# Patient Record
Sex: Female | Born: 1965 | Race: White | Hispanic: No | Smoking: Former smoker
Health system: Southern US, Community
[De-identification: ages and names within clinical notes are randomized; demographics above are authoritative.]

## PROBLEM LIST (undated history)

## (undated) DIAGNOSIS — F329 Major depressive disorder, single episode, unspecified: Secondary | ICD-10-CM

## (undated) DIAGNOSIS — R142 Eructation: Secondary | ICD-10-CM

## (undated) DIAGNOSIS — F32A Depression, unspecified: Secondary | ICD-10-CM

## (undated) DIAGNOSIS — R209 Unspecified disturbances of skin sensation: Secondary | ICD-10-CM

## (undated) DIAGNOSIS — M961 Postlaminectomy syndrome, not elsewhere classified: Secondary | ICD-10-CM

## (undated) DIAGNOSIS — K219 Gastro-esophageal reflux disease without esophagitis: Secondary | ICD-10-CM

## (undated) DIAGNOSIS — M1990 Unspecified osteoarthritis, unspecified site: Secondary | ICD-10-CM

## (undated) DIAGNOSIS — Z87442 Personal history of urinary calculi: Secondary | ICD-10-CM

## (undated) DIAGNOSIS — G8928 Other chronic postprocedural pain: Secondary | ICD-10-CM

## (undated) DIAGNOSIS — M797 Fibromyalgia: Secondary | ICD-10-CM

## (undated) DIAGNOSIS — F319 Bipolar disorder, unspecified: Secondary | ICD-10-CM

## (undated) DIAGNOSIS — M810 Age-related osteoporosis without current pathological fracture: Secondary | ICD-10-CM

## (undated) DIAGNOSIS — M5430 Sciatica, unspecified side: Secondary | ICD-10-CM

## (undated) DIAGNOSIS — E785 Hyperlipidemia, unspecified: Secondary | ICD-10-CM

## (undated) DIAGNOSIS — F419 Anxiety disorder, unspecified: Secondary | ICD-10-CM

## (undated) DIAGNOSIS — M5417 Radiculopathy, lumbosacral region: Secondary | ICD-10-CM

## (undated) DIAGNOSIS — G894 Chronic pain syndrome: Secondary | ICD-10-CM

## (undated) HISTORY — DX: Postlaminectomy syndrome, not elsewhere classified: M96.1

## (undated) HISTORY — DX: Anxiety disorder, unspecified: F41.9

## (undated) HISTORY — DX: Radiculopathy, lumbosacral region: M54.17

## (undated) HISTORY — DX: Other chronic postprocedural pain: G89.28

## (undated) HISTORY — DX: Hyperlipidemia, unspecified: E78.5

## (undated) HISTORY — DX: Chronic pain syndrome: G89.4

## (undated) HISTORY — PX: CYSTECTOMY: SUR359

## (undated) HISTORY — PX: ABDOMINAL HYSTERECTOMY: SHX81

## (undated) HISTORY — DX: Bipolar disorder, unspecified: F31.9

## (undated) HISTORY — PX: WRIST ARTHROPLASTY: SHX1088

## (undated) HISTORY — DX: Unspecified disturbances of skin sensation: R20.9

## (undated) HISTORY — PX: BACK SURGERY: SHX140

## (undated) HISTORY — DX: Depression, unspecified: F32.A

## (undated) HISTORY — DX: Sciatica, unspecified side: M54.30

## (undated) HISTORY — DX: Major depressive disorder, single episode, unspecified: F32.9

## (undated) HISTORY — DX: Unspecified osteoarthritis, unspecified site: M19.90

## (undated) HISTORY — PX: DIAGNOSTIC LAPAROSCOPY: SUR761

---

## 1998-09-16 ENCOUNTER — Emergency Department (HOSPITAL_COMMUNITY): Admission: EM | Admit: 1998-09-16 | Discharge: 1998-09-16 | Payer: Self-pay | Admitting: Emergency Medicine

## 1998-09-18 ENCOUNTER — Ambulatory Visit (HOSPITAL_COMMUNITY): Admission: RE | Admit: 1998-09-18 | Discharge: 1998-09-18 | Payer: Self-pay | Admitting: *Deleted

## 1998-09-25 ENCOUNTER — Ambulatory Visit (HOSPITAL_COMMUNITY): Admission: RE | Admit: 1998-09-25 | Discharge: 1998-09-25 | Payer: Self-pay | Admitting: *Deleted

## 1998-10-02 ENCOUNTER — Ambulatory Visit (HOSPITAL_COMMUNITY): Admission: RE | Admit: 1998-10-02 | Discharge: 1998-10-02 | Payer: Self-pay | Admitting: *Deleted

## 1998-10-02 ENCOUNTER — Encounter: Payer: Self-pay | Admitting: *Deleted

## 1998-10-16 ENCOUNTER — Encounter: Payer: Self-pay | Admitting: *Deleted

## 1998-10-16 ENCOUNTER — Ambulatory Visit (HOSPITAL_COMMUNITY): Admission: RE | Admit: 1998-10-16 | Discharge: 1998-10-16 | Payer: Self-pay | Admitting: *Deleted

## 1998-10-30 ENCOUNTER — Ambulatory Visit (HOSPITAL_COMMUNITY): Admission: RE | Admit: 1998-10-30 | Discharge: 1998-10-30 | Payer: Self-pay | Admitting: *Deleted

## 2000-01-12 ENCOUNTER — Encounter: Payer: Self-pay | Admitting: Family Medicine

## 2000-01-12 ENCOUNTER — Ambulatory Visit (HOSPITAL_COMMUNITY): Admission: RE | Admit: 2000-01-12 | Discharge: 2000-01-12 | Payer: Self-pay | Admitting: Family Medicine

## 2000-02-01 ENCOUNTER — Emergency Department (HOSPITAL_COMMUNITY): Admission: EM | Admit: 2000-02-01 | Discharge: 2000-02-01 | Payer: Self-pay | Admitting: Emergency Medicine

## 2000-02-02 ENCOUNTER — Encounter: Payer: Self-pay | Admitting: Emergency Medicine

## 2001-11-10 ENCOUNTER — Other Ambulatory Visit: Admission: RE | Admit: 2001-11-10 | Discharge: 2001-11-10 | Payer: Self-pay | Admitting: *Deleted

## 2001-11-11 ENCOUNTER — Emergency Department (HOSPITAL_COMMUNITY): Admission: EM | Admit: 2001-11-11 | Discharge: 2001-11-11 | Payer: Self-pay | Admitting: Emergency Medicine

## 2001-11-11 ENCOUNTER — Encounter: Payer: Self-pay | Admitting: Emergency Medicine

## 2002-06-28 ENCOUNTER — Encounter: Admission: RE | Admit: 2002-06-28 | Discharge: 2002-06-28 | Payer: Self-pay | Admitting: Specialist

## 2002-06-28 ENCOUNTER — Encounter: Payer: Self-pay | Admitting: Specialist

## 2002-07-18 ENCOUNTER — Encounter: Payer: Self-pay | Admitting: Specialist

## 2002-07-18 ENCOUNTER — Inpatient Hospital Stay (HOSPITAL_COMMUNITY): Admission: RE | Admit: 2002-07-18 | Discharge: 2002-07-22 | Payer: Self-pay | Admitting: Specialist

## 2003-04-05 ENCOUNTER — Emergency Department (HOSPITAL_COMMUNITY): Admission: AD | Admit: 2003-04-05 | Discharge: 2003-04-05 | Payer: Self-pay | Admitting: Emergency Medicine

## 2008-07-22 ENCOUNTER — Ambulatory Visit: Payer: Self-pay | Admitting: Internal Medicine

## 2008-07-22 DIAGNOSIS — M797 Fibromyalgia: Secondary | ICD-10-CM | POA: Insufficient documentation

## 2008-07-22 DIAGNOSIS — R930 Abnormal findings on diagnostic imaging of skull and head, not elsewhere classified: Secondary | ICD-10-CM | POA: Insufficient documentation

## 2008-07-22 DIAGNOSIS — M545 Low back pain: Secondary | ICD-10-CM

## 2008-07-22 DIAGNOSIS — Z87442 Personal history of urinary calculi: Secondary | ICD-10-CM

## 2008-07-22 DIAGNOSIS — G609 Hereditary and idiopathic neuropathy, unspecified: Secondary | ICD-10-CM | POA: Insufficient documentation

## 2008-07-22 DIAGNOSIS — J45909 Unspecified asthma, uncomplicated: Secondary | ICD-10-CM | POA: Insufficient documentation

## 2008-07-22 DIAGNOSIS — J309 Allergic rhinitis, unspecified: Secondary | ICD-10-CM | POA: Insufficient documentation

## 2008-07-22 DIAGNOSIS — F329 Major depressive disorder, single episode, unspecified: Secondary | ICD-10-CM

## 2008-07-22 DIAGNOSIS — F411 Generalized anxiety disorder: Secondary | ICD-10-CM | POA: Insufficient documentation

## 2008-08-02 ENCOUNTER — Telehealth (INDEPENDENT_AMBULATORY_CARE_PROVIDER_SITE_OTHER): Payer: Self-pay | Admitting: *Deleted

## 2008-08-14 ENCOUNTER — Encounter: Payer: Self-pay | Admitting: Internal Medicine

## 2008-08-14 ENCOUNTER — Ambulatory Visit: Payer: Self-pay | Admitting: Internal Medicine

## 2008-08-20 ENCOUNTER — Encounter (INDEPENDENT_AMBULATORY_CARE_PROVIDER_SITE_OTHER): Payer: Self-pay | Admitting: *Deleted

## 2008-09-04 ENCOUNTER — Encounter
Admission: RE | Admit: 2008-09-04 | Discharge: 2008-12-03 | Payer: Self-pay | Admitting: Physical Medicine & Rehabilitation

## 2008-09-05 ENCOUNTER — Ambulatory Visit: Payer: Self-pay | Admitting: Physical Medicine & Rehabilitation

## 2008-09-26 ENCOUNTER — Ambulatory Visit: Payer: Self-pay | Admitting: Physical Medicine & Rehabilitation

## 2008-10-17 ENCOUNTER — Ambulatory Visit: Payer: Self-pay | Admitting: Physical Medicine & Rehabilitation

## 2008-10-22 ENCOUNTER — Ambulatory Visit: Payer: Self-pay | Admitting: Internal Medicine

## 2008-11-14 ENCOUNTER — Telehealth (INDEPENDENT_AMBULATORY_CARE_PROVIDER_SITE_OTHER): Payer: Self-pay | Admitting: *Deleted

## 2008-11-14 ENCOUNTER — Ambulatory Visit: Payer: Self-pay | Admitting: Physical Medicine & Rehabilitation

## 2008-12-16 ENCOUNTER — Ambulatory Visit: Payer: Self-pay | Admitting: Physical Medicine & Rehabilitation

## 2008-12-16 ENCOUNTER — Encounter
Admission: RE | Admit: 2008-12-16 | Discharge: 2009-03-16 | Payer: Self-pay | Admitting: Physical Medicine & Rehabilitation

## 2009-01-20 ENCOUNTER — Ambulatory Visit: Payer: Self-pay | Admitting: Physical Medicine & Rehabilitation

## 2009-01-29 ENCOUNTER — Ambulatory Visit: Payer: Self-pay | Admitting: Internal Medicine

## 2009-01-30 LAB — CONVERTED CEMR LAB: Anti Nuclear Antibody(ANA): NEGATIVE

## 2009-01-31 LAB — CONVERTED CEMR LAB
ALT: 21 units/L (ref 0–35)
AST: 27 units/L (ref 0–37)
Alkaline Phosphatase: 81 units/L (ref 39–117)
BUN: 14 mg/dL (ref 6–23)
Bilirubin Urine: NEGATIVE
Bilirubin, Direct: 0.1 mg/dL (ref 0.0–0.3)
Chloride: 110 meq/L (ref 96–112)
Cholesterol: 218 mg/dL — ABNORMAL HIGH (ref 0–200)
Eosinophils Absolute: 0.2 10*3/uL (ref 0.0–0.7)
Eosinophils Relative: 2.9 % (ref 0.0–5.0)
Folate: 17.1 ng/mL
GFR calc non Af Amer: 96.49 mL/min (ref >60)
HCT: 38.9 % (ref 36.0–46.0)
HDL: 67.1 mg/dL (ref >39.00)
Hemoglobin, Urine: NEGATIVE
Leukocytes, UA: NEGATIVE
Lymphs Abs: 1.9 10*3/uL (ref 0.7–4.0)
MCV: 88.1 fL (ref 78.0–100.0)
Neutro Abs: 3.2 10*3/uL (ref 1.4–7.7)
Nitrite: NEGATIVE
Potassium: 4 meq/L (ref 3.5–5.1)
RBC: 4.41 M/uL (ref 3.87–5.11)
Rhuematoid fact SerPl-aCnc: <20 intl units/mL (ref 0.0–20.0)
Specific Gravity, Urine: 1.02 (ref 1.000–1.030)
Total Protein, Urine: NEGATIVE mg/dL
Urine Glucose: NEGATIVE mg/dL
Urobilinogen, UA: 0.2 (ref 0.0–1.0)
Vitamin B-12: 487 pg/mL (ref 211–911)
pH: 7.5 (ref 5.0–8.0)

## 2009-02-18 ENCOUNTER — Telehealth (INDEPENDENT_AMBULATORY_CARE_PROVIDER_SITE_OTHER): Payer: Self-pay | Admitting: *Deleted

## 2009-02-18 DIAGNOSIS — M549 Dorsalgia, unspecified: Secondary | ICD-10-CM | POA: Insufficient documentation

## 2009-02-24 ENCOUNTER — Telehealth (INDEPENDENT_AMBULATORY_CARE_PROVIDER_SITE_OTHER): Payer: Self-pay | Admitting: *Deleted

## 2009-02-25 ENCOUNTER — Ambulatory Visit: Payer: Self-pay | Admitting: Physical Medicine & Rehabilitation

## 2009-03-25 ENCOUNTER — Encounter
Admission: RE | Admit: 2009-03-25 | Discharge: 2009-06-23 | Payer: Self-pay | Admitting: Physical Medicine & Rehabilitation

## 2009-03-26 ENCOUNTER — Ambulatory Visit: Payer: Self-pay | Admitting: Physical Medicine & Rehabilitation

## 2009-04-25 ENCOUNTER — Ambulatory Visit: Payer: Self-pay | Admitting: Physical Medicine & Rehabilitation

## 2009-05-26 ENCOUNTER — Ambulatory Visit: Payer: Self-pay | Admitting: Internal Medicine

## 2009-05-26 ENCOUNTER — Ambulatory Visit: Payer: Self-pay | Admitting: Physical Medicine & Rehabilitation

## 2009-05-26 DIAGNOSIS — R10811 Right upper quadrant abdominal tenderness: Secondary | ICD-10-CM | POA: Insufficient documentation

## 2009-05-26 DIAGNOSIS — R61 Generalized hyperhidrosis: Secondary | ICD-10-CM

## 2009-05-26 DIAGNOSIS — R222 Localized swelling, mass and lump, trunk: Secondary | ICD-10-CM | POA: Insufficient documentation

## 2009-05-26 LAB — CONVERTED CEMR LAB
AST: 22 units/L (ref 0–37)
Albumin: 3.8 g/dL (ref 3.5–5.2)
BUN: 19 mg/dL (ref 6–23)
Basophils Absolute: 0 10*3/uL (ref 0.0–0.1)
Bilirubin Urine: NEGATIVE
Bilirubin, Direct: 0 mg/dL (ref 0.0–0.3)
Calcium: 8.8 mg/dL (ref 8.4–10.5)
Eosinophils Relative: 3.4 % (ref 0.0–5.0)
GFR calc non Af Amer: 96.35 mL/min (ref >60)
HCT: 37 % (ref 36.0–46.0)
Hemoglobin: 12.6 g/dL (ref 12.0–15.0)
Ketones, ur: NEGATIVE mg/dL
Leukocytes, UA: NEGATIVE
Lymphocytes Relative: 33 % (ref 12.0–46.0)
MCV: 90.1 fL (ref 78.0–100.0)
Monocytes Absolute: 0.4 10*3/uL (ref 0.1–1.0)
Nitrite: NEGATIVE
Platelets: 239 10*3/uL (ref 150.0–400.0)
RBC: 4.11 M/uL (ref 3.87–5.11)
TSH: 0.45 microintl units/mL (ref 0.35–5.50)
Total Protein: 6.7 g/dL (ref 6.0–8.3)
Urobilinogen, UA: 0.2 (ref 0.0–1.0)
WBC: 4.9 10*3/uL (ref 4.5–10.5)

## 2009-05-28 ENCOUNTER — Encounter: Payer: Self-pay | Admitting: Internal Medicine

## 2009-05-28 LAB — CONVERTED CEMR LAB
Alpha-1-Globulin: 3.5 % (ref 2.9–4.9)
Alpha-2-Globulin: 9.9 % (ref 7.1–11.8)
Beta Globulin: 5.7 % (ref 4.7–7.2)
Gamma Globulin: 13.1 % (ref 11.1–18.8)

## 2009-05-30 ENCOUNTER — Encounter (INDEPENDENT_AMBULATORY_CARE_PROVIDER_SITE_OTHER): Payer: Self-pay | Admitting: *Deleted

## 2009-05-30 ENCOUNTER — Encounter: Admission: RE | Admit: 2009-05-30 | Discharge: 2009-05-30 | Payer: Self-pay | Admitting: Internal Medicine

## 2009-06-02 LAB — CONVERTED CEMR LAB
Catecholamines Tot(E+NE) 24 Hr U: 0.097 mg/24hr
Dopamine 24 Hr Urine: 179 mcg/24hr (ref <500)
Epinephrine 24 Hr Urine: 14 mcg/24hr (ref <20)
Metaneph Total, Ur: 514 ug/24hr (ref 182–739)
Metanephrines, Ur: 123 (ref 58–203)
Norepinephrine 24 Hr Urine: 83 mcg/24hr — ABNORMAL HIGH (ref <80)
Normetanephrine, 24H Ur: 391 (ref 88–649)

## 2009-06-23 ENCOUNTER — Encounter
Admission: RE | Admit: 2009-06-23 | Discharge: 2009-08-21 | Payer: Self-pay | Admitting: Physical Medicine & Rehabilitation

## 2009-06-24 ENCOUNTER — Ambulatory Visit: Payer: Self-pay | Admitting: Physical Medicine & Rehabilitation

## 2009-07-22 ENCOUNTER — Ambulatory Visit: Payer: Self-pay | Admitting: Physical Medicine & Rehabilitation

## 2009-08-05 ENCOUNTER — Ambulatory Visit: Payer: Self-pay | Admitting: Internal Medicine

## 2009-08-05 DIAGNOSIS — R109 Unspecified abdominal pain: Secondary | ICD-10-CM | POA: Insufficient documentation

## 2009-08-05 LAB — CONVERTED CEMR LAB
Bilirubin Urine: NEGATIVE
Leukocytes, UA: NEGATIVE
Specific Gravity, Urine: 1.025 (ref 1.000–1.030)
Urobilinogen, UA: 0.2 (ref 0.0–1.0)
pH: 6 (ref 5.0–8.0)

## 2009-08-08 ENCOUNTER — Encounter (INDEPENDENT_AMBULATORY_CARE_PROVIDER_SITE_OTHER): Payer: Self-pay | Admitting: *Deleted

## 2009-08-08 ENCOUNTER — Ambulatory Visit: Payer: Self-pay | Admitting: Cardiology

## 2009-08-13 ENCOUNTER — Telehealth: Payer: Self-pay | Admitting: Internal Medicine

## 2009-08-14 DIAGNOSIS — E119 Type 2 diabetes mellitus without complications: Secondary | ICD-10-CM | POA: Insufficient documentation

## 2009-08-19 ENCOUNTER — Ambulatory Visit: Payer: Self-pay | Admitting: Physical Medicine & Rehabilitation

## 2009-09-01 ENCOUNTER — Telehealth: Payer: Self-pay | Admitting: Internal Medicine

## 2009-09-02 ENCOUNTER — Telehealth: Payer: Self-pay | Admitting: Internal Medicine

## 2009-09-19 ENCOUNTER — Encounter
Admission: RE | Admit: 2009-09-19 | Discharge: 2009-10-20 | Payer: Self-pay | Admitting: Physical Medicine & Rehabilitation

## 2009-09-23 ENCOUNTER — Ambulatory Visit: Payer: Self-pay | Admitting: Physical Medicine & Rehabilitation

## 2009-10-10 ENCOUNTER — Encounter: Admission: RE | Admit: 2009-10-10 | Discharge: 2009-10-10 | Payer: Self-pay | Admitting: Internal Medicine

## 2009-10-20 ENCOUNTER — Encounter
Admission: RE | Admit: 2009-10-20 | Discharge: 2010-01-18 | Payer: Self-pay | Admitting: Physical Medicine & Rehabilitation

## 2009-10-22 ENCOUNTER — Ambulatory Visit: Payer: Self-pay | Admitting: Physical Medicine & Rehabilitation

## 2009-11-11 ENCOUNTER — Ambulatory Visit: Payer: Self-pay | Admitting: Gastroenterology

## 2009-11-24 ENCOUNTER — Encounter
Admission: RE | Admit: 2009-11-24 | Discharge: 2009-11-24 | Payer: Self-pay | Admitting: Physical Medicine & Rehabilitation

## 2009-11-24 ENCOUNTER — Ambulatory Visit: Payer: Self-pay | Admitting: Physical Medicine & Rehabilitation

## 2009-12-01 ENCOUNTER — Telehealth: Payer: Self-pay | Admitting: Internal Medicine

## 2009-12-24 ENCOUNTER — Ambulatory Visit: Payer: Self-pay | Admitting: Physical Medicine & Rehabilitation

## 2010-01-28 ENCOUNTER — Encounter
Admission: RE | Admit: 2010-01-28 | Discharge: 2010-04-28 | Payer: Self-pay | Admitting: Physical Medicine & Rehabilitation

## 2010-02-18 ENCOUNTER — Ambulatory Visit: Payer: Self-pay | Admitting: Physical Medicine & Rehabilitation

## 2010-03-24 ENCOUNTER — Ambulatory Visit: Payer: Self-pay | Admitting: Physical Medicine & Rehabilitation

## 2010-04-27 ENCOUNTER — Ambulatory Visit: Payer: Self-pay | Admitting: Physical Medicine & Rehabilitation

## 2010-05-20 ENCOUNTER — Encounter
Admission: RE | Admit: 2010-05-20 | Discharge: 2010-08-18 | Payer: Self-pay | Source: Home / Self Care | Attending: Physical Medicine & Rehabilitation | Admitting: Physical Medicine & Rehabilitation

## 2010-05-21 ENCOUNTER — Ambulatory Visit: Payer: Self-pay | Admitting: Physical Medicine & Rehabilitation

## 2010-06-17 ENCOUNTER — Ambulatory Visit: Payer: Self-pay | Admitting: Physical Medicine & Rehabilitation

## 2010-07-15 ENCOUNTER — Ambulatory Visit: Payer: Self-pay | Admitting: Physical Medicine & Rehabilitation

## 2010-07-27 ENCOUNTER — Ambulatory Visit (HOSPITAL_COMMUNITY)
Admission: RE | Admit: 2010-07-27 | Discharge: 2010-07-27 | Payer: Self-pay | Source: Home / Self Care | Admitting: Physical Medicine & Rehabilitation

## 2010-08-11 ENCOUNTER — Encounter
Admission: RE | Admit: 2010-08-11 | Discharge: 2010-08-17 | Payer: Self-pay | Source: Home / Self Care | Attending: Physical Medicine & Rehabilitation | Admitting: Physical Medicine & Rehabilitation

## 2010-08-17 ENCOUNTER — Ambulatory Visit: Payer: Self-pay | Admitting: Physical Medicine & Rehabilitation

## 2010-09-14 ENCOUNTER — Encounter
Admission: RE | Admit: 2010-09-14 | Discharge: 2010-09-29 | Payer: Self-pay | Source: Home / Self Care | Attending: Physical Medicine & Rehabilitation | Admitting: Physical Medicine & Rehabilitation

## 2010-09-16 ENCOUNTER — Ambulatory Visit
Admission: RE | Admit: 2010-09-16 | Discharge: 2010-09-16 | Payer: Self-pay | Source: Home / Self Care | Attending: Physical Medicine & Rehabilitation | Admitting: Physical Medicine & Rehabilitation

## 2010-09-17 ENCOUNTER — Encounter
Admission: RE | Admit: 2010-09-17 | Discharge: 2010-09-17 | Payer: Self-pay | Source: Home / Self Care | Attending: Internal Medicine | Admitting: Internal Medicine

## 2010-09-20 ENCOUNTER — Encounter: Payer: Self-pay | Admitting: Physical Medicine & Rehabilitation

## 2010-09-29 NOTE — Progress Notes (Signed)
  Phone Note Refill Request  on December 01, 2009 3:35 PM  Refills Requested: Medication #1:  SAVELLA 100 MG TABS 1 by mouth two times a day   Dosage confirmed as above?Dosage Confirmed   Notes: [facility] [phone] Initial call taken by: Scharlene Gloss,  December 01, 2009 3:35 PM    Prescriptions: SAVELLA 100 MG TABS (MILNACIPRAN HCL) 1 by mouth two times a day  #60 x 6   Entered by:   Scharlene Gloss   Authorized by:   Corwin Levins MD   Signed by:   Scharlene Gloss on 12/01/2009   Method used:   Faxed to ...       [facility]* (retail)       [address]       [place], [place]  [address]       Ph: [phone]       Fax: [phone]   RxID:   [id number]

## 2010-09-29 NOTE — Progress Notes (Signed)
Summary: Rx request  Phone Note Other Incoming   Caller: [facility] [phone] Summary of Call: Pharmacy called requesting refills of pt ProAir inhaler. Initial call taken by: Margaret Pyle, CMA,  September 01, 2009 10:46 AM    Prescriptions: PROAIR HFA 108 (90 BASE) MCG/ACT AERS (ALBUTEROL SULFATE) 2 puffs qid as needed  #1 x 11   Entered by:   Margaret Pyle, CMA   Authorized by:   Corwin Levins MD   Signed by:   Margaret Pyle, CMA on 09/01/2009   Method used:   Electronically to        [facility]* (retail)       [address]       [place], [place]  [address]       Ph: [phone]       Fax: [phone]   RxID:   [id number]

## 2010-09-29 NOTE — Progress Notes (Signed)
Summary: referral  Phone Note Call from Patient Call back at Home Phone [phone]   Caller: Patient Summary of Call: pt called stating that she still has RT abd pain and would like referral to GI Initial call taken by: Margaret Pyle, CMA,  September 02, 2009 10:50 AM  Follow-up for Phone Call        ok for referral - I did the order Follow-up by: Corwin Levins MD,  September 02, 2009 1:10 PM  Additional Follow-up for Phone Call Additional follow up Details #1::        pt informed and told to expect a call from [facility] with appt info Additional Follow-up by: Margaret Pyle, CMA,  September 02, 2009 1:38 PM

## 2010-10-14 ENCOUNTER — Encounter: Payer: PRIVATE HEALTH INSURANCE | Attending: Physical Medicine & Rehabilitation

## 2010-10-14 ENCOUNTER — Ambulatory Visit: Payer: PRIVATE HEALTH INSURANCE

## 2010-10-14 DIAGNOSIS — IMO0002 Reserved for concepts with insufficient information to code with codable children: Secondary | ICD-10-CM

## 2010-10-14 DIAGNOSIS — R209 Unspecified disturbances of skin sensation: Secondary | ICD-10-CM

## 2010-10-14 DIAGNOSIS — IMO0001 Reserved for inherently not codable concepts without codable children: Secondary | ICD-10-CM | POA: Insufficient documentation

## 2010-10-14 DIAGNOSIS — M79609 Pain in unspecified limb: Secondary | ICD-10-CM | POA: Insufficient documentation

## 2010-10-14 DIAGNOSIS — G5730 Lesion of lateral popliteal nerve, unspecified lower limb: Secondary | ICD-10-CM | POA: Insufficient documentation

## 2010-10-14 DIAGNOSIS — M961 Postlaminectomy syndrome, not elsewhere classified: Secondary | ICD-10-CM

## 2010-11-13 ENCOUNTER — Encounter: Payer: PRIVATE HEALTH INSURANCE | Attending: Physical Medicine & Rehabilitation

## 2010-11-13 ENCOUNTER — Ambulatory Visit: Payer: PRIVATE HEALTH INSURANCE | Admitting: Physical Medicine & Rehabilitation

## 2010-11-13 DIAGNOSIS — M79609 Pain in unspecified limb: Secondary | ICD-10-CM | POA: Insufficient documentation

## 2010-11-13 DIAGNOSIS — IMO0002 Reserved for concepts with insufficient information to code with codable children: Secondary | ICD-10-CM | POA: Insufficient documentation

## 2010-11-13 DIAGNOSIS — G5730 Lesion of lateral popliteal nerve, unspecified lower limb: Secondary | ICD-10-CM | POA: Insufficient documentation

## 2010-11-13 DIAGNOSIS — IMO0001 Reserved for inherently not codable concepts without codable children: Secondary | ICD-10-CM | POA: Insufficient documentation

## 2010-11-13 DIAGNOSIS — R209 Unspecified disturbances of skin sensation: Secondary | ICD-10-CM | POA: Insufficient documentation

## 2010-12-15 ENCOUNTER — Encounter: Payer: PRIVATE HEALTH INSURANCE | Attending: Physical Medicine & Rehabilitation

## 2010-12-15 ENCOUNTER — Ambulatory Visit: Payer: PRIVATE HEALTH INSURANCE | Admitting: Physical Medicine & Rehabilitation

## 2010-12-15 DIAGNOSIS — M961 Postlaminectomy syndrome, not elsewhere classified: Secondary | ICD-10-CM

## 2010-12-15 DIAGNOSIS — IMO0002 Reserved for concepts with insufficient information to code with codable children: Secondary | ICD-10-CM | POA: Insufficient documentation

## 2010-12-15 DIAGNOSIS — R209 Unspecified disturbances of skin sensation: Secondary | ICD-10-CM | POA: Insufficient documentation

## 2010-12-15 DIAGNOSIS — M533 Sacrococcygeal disorders, not elsewhere classified: Secondary | ICD-10-CM

## 2010-12-15 DIAGNOSIS — IMO0001 Reserved for inherently not codable concepts without codable children: Secondary | ICD-10-CM | POA: Insufficient documentation

## 2010-12-15 DIAGNOSIS — G5730 Lesion of lateral popliteal nerve, unspecified lower limb: Secondary | ICD-10-CM | POA: Insufficient documentation

## 2010-12-15 DIAGNOSIS — M79609 Pain in unspecified limb: Secondary | ICD-10-CM | POA: Insufficient documentation

## 2010-12-15 NOTE — Assessment & Plan Note (Signed)
REASON FOR VISIT:  Fall in February with new pain in the right buttock and posterior thigh.  A 45 year old female with history of L5-S1 laminectomy for S1 radiculopathy, as well as a PLIF.  She has a right peroneal neuropathy as well.  She has had a fall in the bathtub where she fell on her buttocks.  Her left leg was hanging out of the tub and now is that some right posterior thigh and buttock pain.  She has no numbness or tingling in the right thigh, although she does have some in the right leg in the left lower extremity as well.  She has had no problems with walking, her self-care, although it does take her a bit longer to get her shoes and socks on.  Her sleep is poor.  The pain feels constant.  SOCIAL:  Has had multiple issues and her cousin passed away last month, went to the funeral.  Mother had hip replacement in April.  Other family members, son was hospitalized.  PHYSICAL EXAMINATION:  VITAL SIGNS:  Blood pressure 114/76, pulse 75, respirations 18, and O2 sat 100% on room air. GENERAL:  No acute distress.  Mood and affect appropriate. MUSCULOSKELETAL:  Her gait shows no evidence of toe drag or knee instability.  She has limited range of motion in lumbar spine about 25% range forward flexion/extension, lateral rotation and bending.  Does have more pain when bending toward the left side than toward the right side.  Patrick's test has positive pain over the sacroiliac area. Sensation is normal, bilateral lower extremities.  Deep tendon reflexes are abnormal, left ankle which is chronic, otherwise motor strength is normal in lower extremities.  Femoral stretch test is normal.  Hip range of motion is normal.  IMPRESSION: 1. Right buttock and posterior thigh pain after a fall, symptoms most     consistent with sacroiliac disorder. 2. 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 3. Peroneal neuropathy, right lower extremity  appears to be improving     from a clinical standpoint.  PLAN: 1. We will check a pelvic x-ray, to see if there is any sacroiliac     abnormalities.  I think this is more of a contusion trauma and not     a fracture, but we will evaluate. 2. Medrol Dosepak for inflammatory component. 3. Continue oxycodone. 4. I will see her back in 1 month if not much better, we will do     sacroiliac injection plus minus PT.  Discussed with the patient,     agrees with plan.     Erick Colace, M.D. Electronically Signed    AEK/MedQ D:  12/15/2010 09:39:33  T:  12/15/2010 21:48:58  Job #:  [id number]

## 2011-01-05 ENCOUNTER — Other Ambulatory Visit: Payer: Self-pay | Admitting: Physical Medicine & Rehabilitation

## 2011-01-05 ENCOUNTER — Ambulatory Visit
Admission: RE | Admit: 2011-01-05 | Discharge: 2011-01-05 | Disposition: A | Discharge status: Home / Self Care | Payer: PRIVATE HEALTH INSURANCE | Source: Ambulatory Visit | Attending: Physical Medicine & Rehabilitation | Admitting: Physical Medicine & Rehabilitation

## 2011-01-05 DIAGNOSIS — R52 Pain, unspecified: Secondary | ICD-10-CM

## 2011-01-11 ENCOUNTER — Encounter: Payer: PRIVATE HEALTH INSURANCE | Attending: Physical Medicine & Rehabilitation

## 2011-01-11 ENCOUNTER — Ambulatory Visit: Payer: PRIVATE HEALTH INSURANCE | Admitting: Physical Medicine & Rehabilitation

## 2011-01-11 DIAGNOSIS — G5730 Lesion of lateral popliteal nerve, unspecified lower limb: Secondary | ICD-10-CM | POA: Insufficient documentation

## 2011-01-11 DIAGNOSIS — R209 Unspecified disturbances of skin sensation: Secondary | ICD-10-CM | POA: Insufficient documentation

## 2011-01-11 DIAGNOSIS — M545 Low back pain: Secondary | ICD-10-CM

## 2011-01-11 DIAGNOSIS — IMO0001 Reserved for inherently not codable concepts without codable children: Secondary | ICD-10-CM | POA: Insufficient documentation

## 2011-01-11 DIAGNOSIS — M961 Postlaminectomy syndrome, not elsewhere classified: Secondary | ICD-10-CM

## 2011-01-11 DIAGNOSIS — IMO0002 Reserved for concepts with insufficient information to code with codable children: Secondary | ICD-10-CM | POA: Insufficient documentation

## 2011-01-11 DIAGNOSIS — M79609 Pain in unspecified limb: Secondary | ICD-10-CM | POA: Insufficient documentation

## 2011-01-11 NOTE — Assessment & Plan Note (Signed)
REASON FOR VISIT:  Back pain, right-sided.  Cindy Pratt is a 45 year old female, I last saw her on December 15, 2010. At that time, she had recently fallen.  We ran x-rays of her pelvic area because she had some pelvic area pain.  These turned out to be normal on Jan 05, 2011.  She had no other new problems in the interval time.  She has constipation that is relieved by milk thistle supplement as well as a probiotic.  She also is on ginger as an antiinflammatory because she cannot tolerate NSAIDs.  REVIEW OF SYSTEMS:  Otherwise negative.  Her pain is described as dull and stabbing, 9/10.  Oswestry score is 68%.  Her back has no tenderness to palpation.  Lumbar spine range of motion reduced in forward flexion and extension.  Her lower extremity strength is normal.  Gait is normal.  Mood and affect are appropriate.  IMPRESSION:  Lumbar post laminectomy syndrome.  She is back to her baseline in terms of chronic pain.  PLAN:  We will go ahead and continue her on her oxycodone 5 times day. She will take her milk thistle and probiotic supplements to avoid constipation.  She also takes supplemental ginger.  She did quite well with a Medrol Dosepak as an antiinflammatory after a fall and states that she sometimes needs to use this up to 4 times per year, although she tries to avoid this given the side effects that are potential.  She will follow up with [facility] next month.  I will see her on a p.r.n. basis.     Erick Colace, M.D. Electronically Signed    AEK/MedQ D:  01/11/2011 10:35:16  T:  01/11/2011 22:58:41  Job #:  [id number]

## 2011-01-12 ENCOUNTER — Ambulatory Visit: Payer: PRIVATE HEALTH INSURANCE | Admitting: Physical Medicine & Rehabilitation

## 2011-01-12 NOTE — Procedures (Signed)
NAMELORIEN, SHINGLER NO.:  [id number]   MEDICAL RECORD NO.:  [id number]            PATIENT TYPE:   LOCATION:                                 FACILITY:   PHYSICIAN:  Erick Colace, M.D.   DATE OF BIRTH:   DATE OF PROCEDURE:  DATE OF DISCHARGE:                               OPERATIVE REPORT   PROCEDURE:  left L1, L2, and L3 medial branch blocks.   INDICATIONS:  Lumbar post laminectomy syndrome with chronic  postoperative pain which interferes with self care and mobility.  Pain  is only partially responsive to medication management including narcotic  and analgesic medications.   Informed consent was obtained after describing risks and benefits of the  procedure with the patient.  These include bleeding, bruising, and  infection.  She elects to proceed and has given written consent.  The  patient placed prone on fluoroscopy table.  Betadine prep, sterile drape  25-gauge inch and a half needle was used to anesthetize the skin and  subcu tissue, 1% lidocaine x2 mL at each of three sites.  Then a 22-  gauge 3-1/2 inch spinal needle was inserted under fluoroscopic guidance  first starting at left L4 SAP transverse process junction.  Bone contact  made, confirmed with lateral imaging.  Omnipaque 180 under live fluoro  demonstrated no intravascular uptake.  A 0.5 mL of solution containing  2% MPF lidocaine were injected, then left L3 SAP transverse process  junction targeted, bone contact made, confirmed with lateral imaging  since Omnipaque 180 x 0.5 mL demonstrated no intravascular uptake and  0.5 mL of lidocaine 2% injected.  Then, left L2 SAP transverse process  junction targeted, bone contact made, confirmed with lateral imaging.  Omnipaque 180 x 0.5 mL demonstrated no intravascular uptake and 0.5 mL  of 2% MPF lidocaine was injected.  The patient tolerated the procedure  well.  Pre and post injection vitals stable.  Post injection  instructions given.  Post  injection 7/10.  We will see her back in about  3 weeks to see how she does with the block.      Erick Colace, M.D.  Electronically Signed     AEK/MEDQ  D:  01/20/2009 12:20:44  T:  01/21/2009 05:52:33  Job:  [id number]

## 2011-01-12 NOTE — Assessment & Plan Note (Signed)
A 45 year old female who I last saw for EMG and CV on November 14, 2008.  This test is essentially normal.  She had no signs of peripheral  neuropathy.  Dr. Oliver Barre has put her on Savella.  She is not sure  whether this has been helping her at this point.  Her main complaints  are hand and foot burning pain as well as pain in her left lower  extremity in her neck as well as low back.  Average pain 8-9.  To her  credit, she has been walking 30 minutes every other day.   Review of systems is positive for weakness, numbness, tingling, trouble  walking, dizziness, depression.  No suicidal ideation.   Her blood pressure is 131/82, pulse 81, respirations 18, O2 sat 100%  room air.  A well-developed, well-nourished female in no acute stress.  Orientation x3.  Affect alert.  Gait is normal.  She has normal  sensation in the upper and lower extremities.  She has no evidence of  edema.  No evidence of intrinsic atrophy.  Her strength is normal in the  upper and lower extremities.   IMPRESSION:  1. Lumbar post laminectomy syndrome with chronic postoperative pain.  2. Fibromyalgia syndrome.  I believe that some of her paresthesias may      be related to this.   PLAN:  I will continue her on her current dose of oxycodone 10/325 one  p.o. q.i.d.   Dr. Jonny Ruiz is writing her other medications other than Cymbalta 20 mg a  day.  She no longer is taking the Topamax.  She has been on low-dose  Cymbalta but is now on Savella 100 mg a day.      Erick Colace, M.D.  Electronically Signed     AEK/MedQ  D:  12/16/2008 16:37:31  T:  12/17/2008 05:44:03  Job #:  [id number]

## 2011-01-12 NOTE — Group Therapy Note (Signed)
The patient referred by Dr. Oliver Barre.   A 45 year old female who has a long history of back pain.  She had an L4-  L5 laminectomy on the left side back in 1997 by a neurosurgeon in  [place], [place].  She then underwent an L5-S1 decompression and  fusion per Dr. Jene Every in 2003.  She has been on narcotic  analgesics for pain since 2005 steadily.  She has moved around the state  and also to [place] over the last 7 years, but moved back to the  [place] area from [place] in March 2009.  She had a pain management  doctor in [place].  They did not do any injections, just medication  management.  She had her last MRI in [facility] around 1 year ago.  She has had no physical therapy or psychology services postoperatively.   She also gives a history of neuropathy.  She thinks her EMG was sometime  in 2007, but does not have the report.  This was done out of town as  well.   In regards to her neuropathy-type symptoms, she states that she does  have burning and tingling in her fingers and toes, but worse in the  fingers than in the toes.   Her sleep is rated as poor.  Her pain is rated about 8/10.  She walks  without an assistive device.  She climbs steps, but states that steps  hurt.  She drives short distances.  She has been on disability since  2003.  She has review of systems positive for weakness, numbness,  tingling, trouble walking, dizziness, depression, anxiety, loss of  taste, night sweats, blood sugar problems, nausea, constipation.  Her  constipation is relieved by laxative use.  She has shortness of breath,  wheezing, and abdominal pain.   Her primary care physician is Dr. Jonny Ruiz.   Her past medical history is significant for COPD as well as arthritis.  She also states that she has osteoporosis.   Her past surgical history in addition to above oophorectomy in 2006,  hysterectomy in 1995.  She has had wrist surgery for a cyst in the past.   CURRENT  MEDICATIONS:  1. Oxycodone 10/325 one p.o. q.i.d., she has enough to last for the      rest of the month and she has another prescription not to be filled      prior to September 26, 2008, for the same.  2. Topamax 25 mg twice a day.  3. Estradiol 0.5 mg per day.  4. ProAir.  5. Advair.  6. Multivitamin.  7. Iron.  8. Calcium.  9. Magnesium.  10.Stool softener.   SOCIAL HISTORY:  As noted above, recently moved to [place], lives  alone, separated, quit smoking 2 years ago.  Denies drug or alcohol  abuse.   FAMILY HISTORY:  Heart disease, diabetes, high blood pressure, alcohol  abuse, disability.   PHYSICAL EXAMINATION:  Blood pressure 127/62, pulse 60, respirations 18,  O2 sat 99% on room air.  In general, no acute distress.  Mood and affect  are appropriate.  Gait is normal with the exception of inability to do  either toe walk or heel walk.  She can briefly go up on her toes or her  heels, but states that her balance is off.  She has no evidence of  ataxia in terms of finger-to-nose or heel-to-shin.  Her sensation is  mildly diminished in a nondermatomal fashion, but her fingertips are  with  reduced sensation compared to the MCP area.  She has normal  sensation in bilateral feet.  She has no evidence of peripheral edema.  She has no evidence of intrinsic atrophy in the extremities.  Her upper  and lower extremity range of motion are full with the exception of her  ankles.  She states she does not move her ankles very well, but  passively she has range to full.   Her motor strength is 5/5 in bilateral biceps, triceps, grip.  Deltoid  are 4 in the lower extremities, 4 at the hip flexor and knee extensor,  and 3 at the ankle dorsiflexor, but some of this maybe effort related.   Her spine range of motion is 50% forward flexion and extension.  She has  no evidence of scoliosis on her spine exam.  She has no hypersensitivity  to touch over her spine incision.  It is well healed.   She has pain with  forward flexion and with extension at the area above her scar.   IMPRESSION:  Lumbar postlaminectomy syndrome.  I believe that she has  some degeneration above the level of the fusion.  She has had a fairly  recent MRI which I would like to review, but would need to get a copy.  I suspect that she either has additional disk degeneration or facet  degeneration above the level of fusion.  We will do diagnostic blocks of  the facets to see whether this relieves at least 50% of her pain.  She  may be a candidate for radiofrequency neurotomy.  In terms of her left  lower extremity pain, we will add some Cymbalta.  For the neuropathic  component of her pain, she is already on low-dose Topamax.  She does not  tolerate Neurontin or Lyrica.  She does have comorbidities of anxiety  and depression, which may also benefit.   In terms of her narcotic analgesic medications, we will check an urine  drug screen first, make sure she is not taking any illicit drugs or  undisclosed opiates.  She really does have a 6-week supply left of her  oxycodone, so there is no need to write another prescription anytime  soon.   In regards to her numbness in her hands greater than her feet, this is  somewhat atypical for peripheral neuropathy and she has no obvious  underlying cause for it either.  I suspect she may have a compressive  neuropathy in her upper extremities either ulnar or median and just some  radicular discomfort in her lower extremities.  We will check EMG and CV  for that as well.   Thank you for this interesting referral.  I will keep you apprised of  her progress.  I do think she will need to have a general conditioning  program, but will do this after we optimize pain medications.      Erick Colace, M.D.  Electronically Signed     AEK/MedQ  D:  09/05/2008 12:59:34  T:  09/06/2008 02:11:53  Job #:  [id number]   cc:   Corwin Levins, MD  [address]   [place]  [place] [address]   Jene Every, M.D.  Fax: [phone]

## 2011-01-12 NOTE — Procedures (Signed)
Cindy Pratt, Cindy Pratt NO.:  [id number]   MEDICAL RECORD NO.:  [id number]          PATIENT TYPE:  REC   LOCATION:  [facility]                          FACILITY:  [facility]   PHYSICIAN:  Erick Colace, M.D.DATE OF BIRTH:  September 14, 1965   DATE OF PROCEDURE:  DATE OF DISCHARGE:                               OPERATIVE REPORT   This is a left sacroiliac injection under fluoroscopic guidance.   INDICATIONS:  Lumbar pain, post laminectomy syndrome.   She has pain over the sacral area.   Informed consent was obtained after describing risks and benefits of the  procedure with the patient.  These include bleeding, bruising, or  infection.  She elects to proceed and has given written consent.  The  patient was placed prone on the fluoroscopy table.  Betadine prep,  sterile drape.  A 25-gauge 1-1/2-inch was used to anesthetize the skin  and subcutaneous tissue with 1% lidocaine x2 mL and a 25-gauge 3-inch  spinal needle was inserted under fluoroscopic guidance, starting the  left sacroiliac joint.  AP and lateral imaging utilized.  Omnipaque 180  under live fluoro demonstrated no intravascular uptake and good joint  outline followed by injection of 1.5 mL of 2% lidocaine.  The patient  tolerated the procedure well.  Pre- and post-injections vitals were  stable.  Post-injection instructions were given.  We will get the pain  diary.      Erick Colace, M.D.  Electronically Signed     AEK/MEDQ  D:  09/26/2008 09:45:00  T:  09/26/2008 21:35:56  Job:  [id number]

## 2011-01-12 NOTE — Assessment & Plan Note (Signed)
Ms. Rick returns today.  She has last seen me Jan 21, 2009.  She  underwent a left L1, L2, L3 medial branch block under fluoroscopic  guidance.  She had no more than 20% reduction of her pain.  She is now  seeing Dr. Regino Schultze upon the recommendation of Dr. Oliver Barre.  Her main  pain is in low back.  She also has some interscapular pain.  Her pain is  worse with walking, bending, sitting, inactivity, and standing.  She can  climb about 4 steps.  She drives.  She is independent with all her  activities of daily living.  She has depression and anxiety.  She has  been on Wellbutrin since earlier this month.   CURRENT MEDICATIONS:  Topamax, estradiol, ProAir, Advair, calcium,  magnesium.  Medications prescribed by this clinic include oxycodone  10/325 one p.o. q.i.d.   Her last urine drug screen was performed on September 06, 2008, which was  consistent.   Examination; blood pressure 124/68, pulse 93, respirations 18 and O2 sat  98% on room air.  General, no acute distress.  Orientation x3.  Affect  is alert.  Gait is normal.  She has no evidence of toe drag or knee  instability.  Extremities without edema.  There is normal sensation in  the lower extremities.  She has good strength in the upper and lower  extremities.  Negative straight leg raising.   Her lumbar scar is nontender to palpation.  No rashes in the lumbar  spine area.  No scoliosis.   IMPRESSION:  1. Lumbar post laminectomy syndrome, not clearly facet mediated.  We      have done L1, L2, L3 medial branch blocks and previously had done      L3, L4, L5.  2. Lower extremity paresthesias as well as left upper extremity      paresthesias.  She has a normal electrodiagnostic study of left      upper and left lower extremity.   The patient is getting some additional workup per Dr. Regino Schultze, and she is  going to see him on Friday July 2.  Certainly, I would be comfortable if  he would like to take over pain management.  At this point,  she appears  relatively stable on her current medications.  I will have her see my  nurse in a month, and I will see her back in 3 months.      Erick Colace, M.D.  Electronically Signed     AEK/MedQ  D:  02/25/2009 15:45:16  T:  02/26/2009 06:04:32  Job #:  [id number]

## 2011-01-12 NOTE — Assessment & Plan Note (Signed)
[name] follows up today October 17, 2008.   [name] is a 45 year old female who has had L5-S1 fusion in 2005 per  Dr. Jillyn Hidden.  She saw me last on September 26, 2008.  She has been treated  for lumbar post laminectomy syndrome.  She had a left sacroiliac  injection under fluoroscopic guidance to address primary left-sided  buttock pain.  However, she really did not get any significant relief  with this.  She has a Oswestry disability score of 62%.   Her average pain score is 9/10.  She walked 5-10 minutes at times, she  climbs steps slowly.  She does not drive as much as she used to.   REVIEW OF SYSTEMS:  Numbness, tingling, dizziness, nausea, abdominal  pain, low blood sugar, shortness breath, wheezing.   Her last hydrocodone was filled September 25, 2008.  She has 37 left.  UDS  has been consistent.  She should have 9-day supply which will be 36.  She is doing well with her prescribed dosages.   Examination in general, no acute stress.  Mood and affect are  appropriate, although mildly anxious.  Hip internal-external rotation is  normal.  Lower extremity strength is normal.  She has pain at L4 and  below.  She has increased pain with extension of lumbar spine more than  with flexion.   IMPRESSION:  1. Lumbar post laminectomy syndrome, chronic postoperative pain,      significant reduction in mobility and self-care.  This is due      mainly to pain.  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  In regards to the oxycodone, we will continue      10/325 q.i.d. continue monitoring program.  2. She gives a history of peripheral neuropathy.  I did a pinprick      exam.  She mainly has left lower extremity reduced in sensation L4-      5, S1, would like to repeat electromyelogram and NCV, she states      she had one years ago, but did not check the upper extremities.  We      will do  left upper, left lower, evaluate for polyneuropathy which I      do not think she has vs radiculopathy which is more likely.  3. We will continue Topamax 50 b.i.d.  We will institute Cymbalta 20      mg p.o. daily and continue her oxycodone as stated.  She did not      tolerate 30 mg Cymbalta due to nausea, vomiting.  Hopefully the 20      will be more tolerable.      Cindy Pratt, M.D.  Electronically Signed     AEK/MedQ  D:  10/17/2008 15:44:35  T:  10/18/2008 04:13:19  Job #:  [id number]

## 2011-01-15 NOTE — H&P (Signed)
NAME:  Cindy Pratt, Cindy Pratt                          ACCOUNT NO.:  [id number]   MEDICAL RECORD NO.:  [id number]                   PATIENT TYPE:  INP   LOCATION:  NA                                   FACILITY:  [facility]   PHYSICIAN:  Jene Every, M.D.                 DATE OF BIRTH:  1965/09/21   DATE OF ADMISSION:  07/18/2002  DATE OF DISCHARGE:                                HISTORY & PHYSICAL   CHIEF COMPLAINT:  Low back pain and with radicular pain into the right lower  extremity.   HISTORY OF PRESENT ILLNESS:  Cindy Pratt is a 45 year old female, who first  presented to our office in September of this year, reporting right lower  extremity radicular pain with numbness and tingling.  The patient works in  [employer] and reported no injury but noted back in March 2003, that she  developed back pain with subsequent lower extremity radicular pain to the  point where now it is severe in nature.  She denies any change of bowel or  bladder function at that point.  MRI was obtained which showed minimal disk  bulge at L4-5 but no herniation or stenosis.  Also showed postoperative  changes on the left at L5-S1 with wide patency on the central canal, some  neural foraminal narrowing without distinct compression of the exiting L5  nerve root.  Prior to the MRI, the patient returned to our office with  continued radicular pain into the right lower extremity.  The patient was  then set up for diskography at L4-5 and L5-S1.  On the diskography, she has  a positive discordant diskography at L5 and S1 with extravasation to the  right L5-S1 with reproduction of her low back pain and right lower extremity  radicular pain.  There was negative diskography at L4-5 with normal  morphology.  It was felt at this time, that the patient had disabling  diskogenic mechanical low back pain secondary to spondylosis at L5-S1 with  concordant diskography with failure to improve with conservative treatment.  It was felt  that the patient would benefit from a lumbar fusion.  The risks  and benefits of the surgery were discussed with the patient, and surgery was  scheduled at [facility] for July 18, 2002, with Jene Every,  M.D.   PAST MEDICAL HISTORY:  Hypoglycemia.   PAST SURGICAL HISTORY:  1. A hysterectomy in 1995.  2. A herniated disk in lumbar spine in 1997.  3. Left wrist surgery.   CURRENT MEDICATIONS:  Percocet p.r.n. pain.   ALLERGIES:  The patient is allergic to ASPIRIN, ALEVE, IBUPROFEN which  causes some shortness of breath.   FAMILY MEDICAL HISTORY:  Significant for diabetes and breast cancer.   REVIEW OF SYSTEMS:  GENERAL:  No fever, chills, night sweats, or bleeding  tendency.  NEUROLOGIC:  No blurred or double vision or seizure or paralysis.  RESPIRATORY:  No shortness of breath, productive cough, or hemoptysis.  CARDIOVASCULAR:  No chest pain, angina, orthopnea.  GI:  No nausea,  vomiting, diarrhea, constipation, or melena.  GU:  No dysuria, hematuria, or  discharge.  MUSCULOSKELETAL:  Pertinent to HPI.   PHYSICAL EXAMINATION:  VITAL SIGNS:  Pulse is 60, respiratory 16, BP 90/60.  GENERAL:  This is a well-developed, well-nourished, 45 year old female in no  acute distress.  HEENT:  Atraumatic, normocephalic.  Pupils equal, round, reactive to light  and intact.  NECK:  Supple, no lymphadenopathy.  CHEST:  Clear to auscultation bilaterally.  No rhonchi, wheezes, or rales.  HEART:  Regular rate and rhythm without murmurs, gallops, or rubs.  ABDOMEN:  Soft, nontender, nondistended.  Bowel sounds all four.  BREASTS/GU:  Not done.  Not pertinent to HPI.  SKIN:  No rashes or lesions are noted.  BACK:  Positive straight leg raise on the left with posterior thigh pain.  Also mildly positive on the right.  The patient does have decreased  lordosis.  NEUROVASCULAR:  Intact.  Sensation is slightly decreased on the lateral  aspect of the calf.  No Babinski's or clonus is  noted.   IMPRESSION:  1. Degenerative disk disease at L5-S1.  2. Hypoglycemia.   PLAN:  The patient will be admitted to [facility] on July 18, 2002, to undergo a posterior lateral interbody fusion at L5-S1 using pedicle  screws, allograft, autograft bone possible with iliac crest bone graft and  also decompression at L5-S1 by Jene Every, M.D.     Roma Schanz, P.A.                   Jene Every, M.D.    CS/MEDQ  D:  07/09/2002  T:  07/09/2002  Job:  [id number]

## 2011-01-15 NOTE — Discharge Summary (Signed)
NAME:  Cindy Pratt, Cindy Pratt                          ACCOUNT NO.:  [id number]   MEDICAL RECORD NO.:  [id number]                   PATIENT TYPE:  INP   LOCATION:  0448                                 FACILITY:  [facility]   PHYSICIAN:  Jene Every, M.D.                 DATE OF BIRTH:  May 19, 1966   DATE OF ADMISSION:  07/18/2002  DATE OF DISCHARGE:  07/22/2002                                 DISCHARGE SUMMARY   ADMISSION DIAGNOSES:  1. Degenerative disk disease L5-S1.  2. Hypoglycemia.   DISCHARGE DIAGNOSES:  1. Degenerative disk disease L5-S1 status post redo decompression L5-S1 with     transforaminal interbody fusion L5-S1, posterolateral decompression using     pedicle screws.  Also, decompression L5-S1 lateral mass fusion utilizing     autograft and Allograft bone.  2. Hypoglycemia.   PROCEDURE:  The patient was taken to the operating room on July 18, 2002  and underwent a redo decompression L5-S1 with transforaminal interbody  fusion L5-S1, posterolateral decompression using pedicle screws,  decompression L5-S1 lateral mass fusion utilizing autograft and Allograft  bone, neurologic monitoring four hours.  Surgeon is Dr. Jene Every.  Assistant is Max Facilities manager.  Surgery was done under general anesthesia.  Hemovac  drain was placed x1 at the time of surgery.   CONSULTS:  PT/OT.   BRIEF HISTORY:  This patient is a 45 year old female who presented to our  office in September this year reporting right lower extremity radicular pain  with numbness and tingling.  The patient works in [employer] and reported no  injury, but noted back pain that began in March 2003 that subsequently  developed into lower extremity radicular pain to the point where it is now  severe in nature.  The patient denied any change in bowel or bladder  function.  MRI was obtained and showed minimal disk bulge at L4-5, but no  herniation or stenosis.  Also, there were postoperative changes the left at  L5-S1 with  wide patency on the central canal, neural foramen narrowing  without distinct compression of the exiting L5 nerve root.  Prior to the MRI  the patient returned to our office with _________ radicular pain in the  right lower extremity.  The patient was then set up for discography at L4-5.  Discography was positive discordant described an L5-S1 with _________ to the  right L5-S1 reproduction of low back and right lower extremity radicular  pain.  It was felt at this time that the patient had disabling discogenic  mechanical low back pain.  She also has spondylosis L5-S1 with concordant  discography with failure to improve with conservative treatment.  It was  felt that patient would benefit from rod fusion.  The patient was  subsequently admitted to the hospital.   LABORATORY DATA:  CBC prior to admission:  Hemoglobin 12.5, hematocrit 36.4.  Serial H&Hs were followed throughout the hospital  course.  Hemoglobin did  decline down to 8.1.  The patient remained asymptomatic.  Differential on  the CBC was within normal limits.  Chemistries at time of admission show the  slightly high glucose at 123, low calcium at 8.2.  Discharge chemistry  levels low sodium at 134, calcium 8.1, otherwise all values fall within  normal range.  UA at time of admission was negative.  UA done during  hospitalization did show a trace of ketones, trace of leukocyte esterase and  on microscopic examination showed 0-2 wbc's with a few bacteria.  The  patient's blood type is O+.  I do not see  pre admission EKG or a chest x-  ray on the chart.   HOSPITAL COURSE:  The patient was admitted to [facility] and taken  to the OR.  She underwent the above stated procedure without complications.  The patient tolerated procedure well and was allowed to return to recovery  room and then to the orthopedic floor to continue postoperative care.  A  Hemovac drain was placed at the time of surgery.  This was pulled on   postoperative day number two without difficulty.  The patient was placed on  PC analgesics for pain control following surgery.  The PCA was kept until  postoperative day number two when the patient was weaned over to p.o.  analgesics.  The hemoglobin and hematocrit were followed very closely.  The  patient did drop postoperative to a level of 8.1.  However, she remained  asymptomatic.  She denied any nausea or weakness.  She was started on  Trinsicon one p.o. t.i.d. on postoperative day number two.  The patient  seemed to tolerate this well and blood transfusion was not required.  Postoperative day number one the patient did have some issues with mild  hypokalemia.  This was treated with 20 mEq K-Dur and serial basic metabolic  panels were ordered.  Postoperative day number two the patient also  complained of some numbness into her left lower extremity which seemed to  gradually be getting worse over time.  The patient was started on Neurontin  200 mg one p.o. t.i.d.  She was also begun on Mobic one p.o. q.d.  The  patient had good bowel sounds postoperatively and her diet was advanced as  tolerated.  PT/OT were consulted for gait training and lumbar stabilization  exercises.  The patient was also taught how to _________ off her chair back  brace.  She did this without difficulty.  Postoperative day number four the  patient was doing well.  She was ambulating without difficulty.  She  continued to deny and dizziness or lightheadedness.  The patient states she  was ready to go home.  Her incision remained clean and dry and intact.  She  had good motor and neurovascular function.  Her pain was well controlled on  p.o. medications.  This patient was discharged to home postoperative day  number four.   DISPOSITION:  The patient was discharged home on July 22, 2002.   DISCHARGE MEDICATIONS:  1. Mobic 15 mg one p.o. q.d.  2. Neurontin 200 mg one p.o. t.i.d. 3. Percocet 5 mg one to two  p.o. q.4-6h. p.r.n. pain.  4. Robaxin 500 mg one p.o. q.6h. p.r.n. spasm.  5. The patient was to continue on Trinsicon one p.o. t.i.d. for the next     three weeks.  6. She is to also take vitamin C supplementation.   DIET:  As tolerated.   ACTIVITY:  The patient can ambulate as tolerated.  She should avoid bending,  twisting, heavy lifting, and sitting or standing for long periods of time.  She is to wear her brace at all times when ambulating.   WOUND CARE:  She is to keep the dressing clean and dry.  She can change on a  q.d. basis.  She is allowed to shower.   FOLLOW UP:  The patient is to follow up with Dr. Shelle Iron in 10-14 days.  She  is to call for appointment at [phone].  She also is to call outpatient  rehabilitation to arrange outpatient physical therapy visits.   CONDITION ON DISCHARGE:  Stable.     Roma Schanz, P.A.                   Jene Every, M.D.    CS/MEDQ  D:  08/09/2002  T:  08/09/2002  Job:  [id number]

## 2011-01-15 NOTE — Op Note (Signed)
NAME:  Cindy Pratt, Cindy Pratt                          ACCOUNT NO.:  [id number]   MEDICAL RECORD NO.:  [id number]                   PATIENT TYPE:  INP   LOCATION:  0010                                 FACILITY:  [facility]   PHYSICIAN:  Jene Every, M.D.                 DATE OF BIRTH:  [birth year]   DATE OF PROCEDURE:  07/18/2002  DATE OF DISCHARGE:                                 OPERATIVE REPORT   PREOPERATIVE DIAGNOSES:  1. Degenerative disk disease, L5-S1.  2. Foraminal stenosis L5-S1.  3. Status post diskectomy.   POSTOPERATIVE DIAGNOSIS:  1. Degenerative disk disease, L5-S1.  2. Foraminal stenosis L5-S1.  3. Status post diskectomy.   PROCEDURE:  Redo decompression L5-S1 with transforaminal interbody fusion L5-  S1.  Posterior lateral decompression using pedicle screws.  Decompression of  L5-S1 lateral mass fusion utilizing autograft and allograft bone,  neuromonitoring four hours.   SURGEON:  Jene Every, M.D.   ASSISTANT:  Sharolyn Douglas, M.D.   ANESTHESIA:  General.   INDICATIONS:  The patient is a 44 year old with end stage disk degeneration  L5-S1, neuroforaminal stenosis, degenerative changes concordant diskography  with disabling mechanical back pain refractory to conservatory treatment,  unlikely to improve without intervention.  Operative intervention is  indicated for fusion, repeat decompression, foraminotomies, transforaminal  interbody fusion, bone grafting.  Risks and benefits discussed including  bleeding, infection, injury to vascular structures, CSF leakage, epidural  fibrosis, adjacent segment disease, nonunion.   DESCRIPTION OF PROCEDURE:  Patient in the supine position, after the  satisfactory level of anesthesia, she was placed prone on the Andrews frame  on chest rolls. All bony prominences were well padded.  The lumbar region was prepped and draped in the usual sterile fashion.  The  previous surgical scar was excised.  The subcutaneous tissue was  dissected.  Electrocautery was utilized to achieve hemostasis.  The lumbar fascia was  identified and divided along the skin incision.  The paraspinous muscles  were elevated from the level of L5-S1.  McCullough retractor was placed.  The McCullough retractor was placed on the spinous process of L5 and  confirmed by x-ray.  Next, we skeletonized the facets at L5-S1.  We removed  the spinous process of L5 and that morselized that and saved the cancellous  bone as that from the facets for further bone grafting.  I carried the  dissection out and identified the transverse process of the ala of L5 and  S1.  I identified the starting point for the pedicle screws and L5 and S1  about the intersection of the transverse process and the superior articular  facet, dissecting out the appropriate angle.  Utilizing the C-arm  augmentation, the pedicle probe finder was utilized to a depth of 40 at 5,  35 at S1 on the left and at 40 on the right S1.  This was with utilization  of the C-arm  and appropriate convergence.  Each pedicle canal was then  probed with a Neoprobe and was found to be in key tangent bone at all times.  This was tapped and at 5, five 7.75 screws  were placed with excellent  purchase of 40 mm in length at S1.  The 7.75 on the left x 35 mm in length  on the right, 5.75 x 40 mm in the length.  Excellent purchase was noted.  Prior to this we decorticated the transverse process and the pars.  Next, we  turned our attention towards the TLIF; it evolved from the side on the left.  We used the high speed bur to decorticate the pars further removed with the  osteotome and the 3 mm Kerrison.  This identified the L5 root.  It was  erythematous and edematous, also removing a portion of the inferior facet  and the superior articulated facet to the pedicle of L5.  We decompressed  the L5 root into the foramen and also the S1 nerve root as well.  This was a  previous laminotomy defect.  A spur was  noted at the disk space.  Bipolar  electrocautery was utilized to achieve hemostasis and remove the spur  posteriorly.  I performed an annulotomy and after identifying the disk  performed a thorough diskectomy and placed the shaver into the disk space  and rotated it first at 6 and then 8.  I performed a thorough diskectomy,  curetted the end-plate and performed through removal of all cartilage and  endplates.  The wound was copiously irrigated.  Neural elements were  protected at all times.  The 8 mm Allograft interbody spacer was then  inserted after burring the tip from the transforaminal approach.  Prior to  this we placed cancellous bone anterior to that and impacted it.  This was  morselized from the facet and the spinous process.  The graft disimpacted  the foramen; excellent placement of the foramen was found to be widely  patent.  S1 and S5 foramen were widely patent.  Neural elements were  protected at all times.  Bipolar electrocautery was utilized to achieve  hemostasis.  The wound was copiously irrigated.  This was seen on her x-ray  and found to be satisfactory.  Next, I used a short rod bilaterally, used a  90-D system compressed over the interbody spacer;  excellent purchase using  a hockey stick probe after that and it was widely patent in the foramen.  Thrombin soaked Gelfoam was placed over the TLIF region.  We placed the  remainder of the cancellous bone out laterally and on to the ala and  utilized DBM and allograft bone as well and this was after locking in the 90-  D system after securing the rods.  The AP and lateral plane was found to be  satisfactory.  The wound was copiously irrigated; no active bleeding or CSF  leakage.  We checked the pedicle screws and they were all over 30 mm amperes  suggesting appropriate insertion.  Also, there was no evidence of problems  with neural monitoring throughout the case.  Next, the Hemovac was placed and brought out through a  lateral segment in the skin.  The dorsal lumbar  fascia was then reapproximated with #1 Vicryl interrupted figure-of-eight  sutures.  Subcutaneous were reapproximated were 2-0 Vicryl simples.  The  skin was reapproximated with staples and the wound dressed sterilely.  The  patient was placed supine on the hospital  bed and extubated without  difficulty, transported to the recovery room in satisfactory condition.  The  patient tolerated the procedure well with no complications.  Blood loss 250  cc.  We used the Cell Saver.                                               Jene Every, M.D.    Cordelia Pen  D:  07/18/2002  T:  07/18/2002  Job:  [id number]

## 2011-02-09 ENCOUNTER — Encounter: Payer: PRIVATE HEALTH INSURANCE | Attending: Neurosurgery | Admitting: Neurosurgery

## 2011-02-09 DIAGNOSIS — M79609 Pain in unspecified limb: Secondary | ICD-10-CM | POA: Insufficient documentation

## 2011-02-09 DIAGNOSIS — M5430 Sciatica, unspecified side: Secondary | ICD-10-CM

## 2011-02-09 DIAGNOSIS — Z79899 Other long term (current) drug therapy: Secondary | ICD-10-CM | POA: Insufficient documentation

## 2011-02-09 DIAGNOSIS — G894 Chronic pain syndrome: Secondary | ICD-10-CM

## 2011-02-09 DIAGNOSIS — M961 Postlaminectomy syndrome, not elsewhere classified: Secondary | ICD-10-CM | POA: Insufficient documentation

## 2011-02-09 DIAGNOSIS — M545 Low back pain, unspecified: Secondary | ICD-10-CM | POA: Insufficient documentation

## 2011-02-10 NOTE — Assessment & Plan Note (Signed)
This is a 45 year old female seen by Dr. Wynn Banker for low back pain. She states she has fallen prior to her last visit here, did a Medrol Dosepak, it helped for a couple of weeks, still having pain, and is requesting MRI.  She rates her pain at 9, sharp, dull, stinging with paresthesias like feeling.  General activity level is 9.  Pain is same all time.  Sleep patterns are poor.  All activities aggravate, nothing helps with the pain.  REVIEW OF SYSTEMS:  Notable for those difficulties as well as some nausea, bladder and bowel control problems, weakness, bladder spasms, paresthesias, depression, anxiety initial thoughts of suicide.  PAST MEDICAL HISTORY:  Unchanged.  SOCIAL HISTORY:  Unchanged.  PHYSICAL EXAMINATION:  Blood pressure is 125/82, pulse 64, respirations 18, O2 sats 96 on room air.  Her motor strength appears to be intact to confrontational testing.  Sensation is intact, although she does have positive straight leg raise on the right.  Constitutionally, she is within normal limits.  She is alert and oriented x3.  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  PLAN: 1. I went ahead and refilled oxycodone 10/325 one up to 5 times a day     150, no refill. 2. We are going to obtain an MRI of the lumbar spine with gadolinium     per her request.  After this is done, she will follow up in this     clinic in 1 month.     Steaven Wholey L. Blima Dessert Electronically Signed    RLW/MedQ D:  02/09/2011 14:02:23  T:  02/10/2011 05:32:00  Job #:  [id number]

## 2011-02-11 ENCOUNTER — Other Ambulatory Visit: Payer: Self-pay | Admitting: Physical Medicine & Rehabilitation

## 2011-02-11 DIAGNOSIS — M545 Low back pain: Secondary | ICD-10-CM

## 2011-02-12 ENCOUNTER — Ambulatory Visit
Admission: RE | Admit: 2011-02-12 | Discharge: 2011-02-12 | Disposition: A | Discharge status: Home / Self Care | Payer: PRIVATE HEALTH INSURANCE | Source: Ambulatory Visit | Attending: Physical Medicine & Rehabilitation | Admitting: Physical Medicine & Rehabilitation

## 2011-02-12 DIAGNOSIS — M545 Low back pain: Secondary | ICD-10-CM

## 2011-02-12 MED ORDER — GADOBENATE DIMEGLUMINE 529 MG/ML IV SOLN
14.0000 mL | Freq: Once | INTRAVENOUS | Status: AC | PRN
  Administered 2011-02-12: 14 mL via INTRAVENOUS

## 2011-03-09 ENCOUNTER — Encounter: Payer: PRIVATE HEALTH INSURANCE | Attending: Neurosurgery | Admitting: Neurosurgery

## 2011-03-09 DIAGNOSIS — G8929 Other chronic pain: Secondary | ICD-10-CM | POA: Insufficient documentation

## 2011-03-09 DIAGNOSIS — M545 Low back pain, unspecified: Secondary | ICD-10-CM | POA: Insufficient documentation

## 2011-03-09 DIAGNOSIS — M961 Postlaminectomy syndrome, not elsewhere classified: Secondary | ICD-10-CM | POA: Insufficient documentation

## 2011-03-10 NOTE — Assessment & Plan Note (Signed)
ACCOUNT:  [id number].  This is a 45 year old female followed by Dr. Wynn Banker for low back pain.  She reports no changes prior to coming here today.  She did have a fall in the past, this aggravated right side of her low back.  She rates her pain about 9.  It is a sharp, burning pain, it is constant. Sleep patterns are poor.  Pain is same 24 hours a day.  She can walk and climb steps.  REVIEW OF SYSTEMS:  Notable for difficulties described above as well as depression, anxiety, no suicidal thoughts or aberrant behavior.  FAMILY HISTORY:  Unchanged.  SOCIAL HISTORY:  Unchanged.  PAST MEDICAL HISTORY:  Unchanged.  PHYSICAL EXAMINATION:  Blood pressure 110/63, pulse 55, respirations 16, O2 sats 95 on room air.  Motor strength 5/5 in lower extremities. Sensation is intact.  Constitutionally, she is within normal limits. She is alert and oriented x3.  Gait normal, she is slow to rise from seated position.  IMPRESSION: 1. Post laminectomy syndrome. 2. Chronic pain.  PLAN: 1. Refilled her oxycodone 1 up to 5 times a day 150 with no refill. 2. She will continue any other herbal medicines or she can take NSAIDs     for anti-inflammatory effect. 3. We reviewed her MRI which does not show anything, but postoperative     degenerative changes.  She understands this.  Questions were     encouraged and answered.     Laiba Fuerte L. Blima Dessert Electronically Signed    RLW/MedQ D:  03/09/2011 13:07:52  T:  03/10/2011 02:04:02  Job #:  [id number]

## 2011-04-07 ENCOUNTER — Encounter: Payer: Medicare Other | Admitting: Neurosurgery

## 2011-04-27 ENCOUNTER — Encounter: Payer: Medicare Other | Attending: Neurosurgery | Admitting: Neurosurgery

## 2011-04-27 ENCOUNTER — Encounter: Payer: PRIVATE HEALTH INSURANCE | Admitting: Neurosurgery

## 2011-04-27 DIAGNOSIS — Z79899 Other long term (current) drug therapy: Secondary | ICD-10-CM | POA: Insufficient documentation

## 2011-04-27 DIAGNOSIS — M79609 Pain in unspecified limb: Secondary | ICD-10-CM | POA: Insufficient documentation

## 2011-04-27 DIAGNOSIS — G894 Chronic pain syndrome: Secondary | ICD-10-CM

## 2011-04-27 DIAGNOSIS — M545 Low back pain, unspecified: Secondary | ICD-10-CM | POA: Insufficient documentation

## 2011-04-27 DIAGNOSIS — G8929 Other chronic pain: Secondary | ICD-10-CM | POA: Insufficient documentation

## 2011-04-27 DIAGNOSIS — M961 Postlaminectomy syndrome, not elsewhere classified: Secondary | ICD-10-CM

## 2011-04-27 NOTE — Assessment & Plan Note (Signed)
ACCOUNT:  [id number].  This patient of Dr. Wynn Banker' seen for low back pain.  She has no change in her low back pain.  She does have some shoulder pain occasionally, she tells me today that her hands and her feet have been hurting.  She does have some visual "nodules on her right foot" to palpation it appeared to be bony, some kind of disruption in the bone structure of her foot.  She states she has had many ligamentous injuries in the past and wonders that is she has had some kind of chip bone that is moved.  She is a past patient of Dr. Jerl Santos and Turner Daniels.  She rates her pain today at 8, it is a sharp burning to dull stabbing, tingling, aching pain that is constant.  General activity level is 8  Sleep patterns are poor.  Pain is same 24 hours a day.  She does not indicate what aggravates or helps her pain.  She only takes oxycodone.  Mobility, she walks without assistance.  She does have a slight limp.  She is not employed.  REVIEW OF SYSTEMS:  Notable for those difficulties otherwise within normal limits.  No suicidal ideation or aberrant behavior.  Oswestry score 72.  Past medical history and social history, family history are all unchanged.  PHYSICAL EXAMINATION:  Her blood pressure 104/67, pulse 65, respirations 18, O2 sats 96 on room air.  Motor strength 5/5 in the lower extremities.  She has normal flexion/extension and her sensation is intact.  Constitutionally, she is within normal limits.  She is alert and oriented x3.  IMPRESSION: 1. Post laminectomy syndrome. 2. Chronic pain. 3. New onset of painful nodules in the right foot greater than left     painful hands.  PLAN: 1. Refill oxycodone.  We are going to drop at 5/325 one up to 5 times     a day, 150 without refill.  She will continue her other OTC     medicines as she is taking. 2. We will refer back to Dr. Jerl Santos for evaluation.  She is     requesting this and I think it is reasonable.  We will see her back  here in a month.  Her questions were encouraged and answered.     Cadell Gabrielson L. Blima Dessert Electronically Signed    RLW/MedQ D:  04/27/2011 09:20:50  T:  04/27/2011 09:57:55  Job #:  [id number]

## 2011-05-25 ENCOUNTER — Encounter: Payer: PRIVATE HEALTH INSURANCE | Attending: Neurosurgery | Admitting: Neurosurgery

## 2011-05-25 DIAGNOSIS — M545 Low back pain, unspecified: Secondary | ICD-10-CM | POA: Insufficient documentation

## 2011-05-25 DIAGNOSIS — M961 Postlaminectomy syndrome, not elsewhere classified: Secondary | ICD-10-CM | POA: Insufficient documentation

## 2011-05-25 DIAGNOSIS — G8928 Other chronic postprocedural pain: Secondary | ICD-10-CM | POA: Insufficient documentation

## 2011-05-25 DIAGNOSIS — G894 Chronic pain syndrome: Secondary | ICD-10-CM

## 2011-05-25 NOTE — Assessment & Plan Note (Signed)
ACCOUNT:  [id number].  This is a patient of Dr. Wynn Banker seen for low back pain.  She states that her pain is unchanged.  She is doing well with the 5 mg oxycodone, which withdraw last month.  She rates her pain at an 8, it is constant pain.  General activity level is 9.  Pain is the same 24 hours a day. Sleep pattern is poor.  Pain is worse with all activities, medication tends to help.  She walks independently.  She is unemployed.  REVIEW OF SYSTEMS:  Notable for difficulties described above, otherwise within normal limits.  PAST MEDICAL HISTORY, SOCIAL HISTORY, AND FAMILY HISTORY:  Unchanged.  Last UDS in August was consistent.  Pill count is correct.  No signs of aberrant behaviors.  No suicidal thoughts.  PHYSICAL EXAMINATION:  Blood pressure 117/66, pulse 53, respirations 16, O2 sats 94% on room air.  Motor strength and sensation are intact in upper and lower extremities.  Constitutionally, she is within normal limits.  She is alert and oriented x3.  She has normal gait.  ASSESSMENT: 1. Postlaminectomy syndrome. 2. Chronic postop pain.  PLAN: 1. Refill oxycodone 5/325 one up to five times a day 150 with no     refill. 2. She will follow up here in 1 month.  Her questions were encouraged     and answered.     Aizik Reh L. Blima Dessert Electronically Signed    RLW/MedQ D:  05/25/2011 09:02:07  T:  05/25/2011 09:44:38  Job #:  [id number]

## 2011-06-22 ENCOUNTER — Encounter: Payer: PRIVATE HEALTH INSURANCE | Admitting: Neurosurgery

## 2011-06-24 ENCOUNTER — Other Ambulatory Visit: Payer: Self-pay | Admitting: Nurse Practitioner

## 2011-06-24 ENCOUNTER — Ambulatory Visit
Admission: RE | Admit: 2011-06-24 | Discharge: 2011-06-24 | Disposition: A | Discharge status: Home / Self Care | Payer: PRIVATE HEALTH INSURANCE | Source: Ambulatory Visit | Attending: Nurse Practitioner | Admitting: Nurse Practitioner

## 2011-06-24 ENCOUNTER — Encounter: Payer: PRIVATE HEALTH INSURANCE | Attending: Neurosurgery | Admitting: Neurosurgery

## 2011-06-24 DIAGNOSIS — M89319 Hypertrophy of bone, unspecified shoulder: Secondary | ICD-10-CM

## 2011-06-24 DIAGNOSIS — M545 Low back pain, unspecified: Secondary | ICD-10-CM | POA: Insufficient documentation

## 2011-06-24 DIAGNOSIS — M25512 Pain in left shoulder: Secondary | ICD-10-CM

## 2011-06-24 DIAGNOSIS — M961 Postlaminectomy syndrome, not elsewhere classified: Secondary | ICD-10-CM

## 2011-06-24 DIAGNOSIS — R209 Unspecified disturbances of skin sensation: Secondary | ICD-10-CM | POA: Insufficient documentation

## 2011-06-24 DIAGNOSIS — G8928 Other chronic postprocedural pain: Secondary | ICD-10-CM

## 2011-06-24 NOTE — Assessment & Plan Note (Signed)
HISTORY:  This is a patient of Kirsteins, seen for low back pain.  She states her pain is unchanged.  She rates her average pain at 8, it is a constant pain.  General activity level is 8-9.  Sleep patterns are poor. She did not indicate what aggravates or improves.  She indicates that the medication seems to help.  Mobility, she is independent. Functionally, she is independent.  REVIEW OF SYSTEMS:  Notable for difficulties described above as well as some numbness, tingling, depression, anxiety.  No suicidal thoughts or aberrant behaviors.  Last UDS and pill count is correct.  PAST MEDICAL HISTORY SOCIAL HISTORY AND FAMILY HISTORY:  Unchanged.  PHYSICAL EXAMINATION:  VITAL SIGNS:  Blood pressure 96/59, pulse 56, respirations 14, and O2 sats 99 on room air. GENERAL:  Motor strength sensation intact. CONSTITUTIONAL:  She is within normal limits.  She is alert and oriented x3.  She has normal gait.  We did check [place] reporting control substance system today.  There was a discrepancy with prescription in August and July that was labeled and was coming from [facility] was actually from here.  There is no issue with her personally, it was reported assisting her.  ASSESSMENT: 1. Post laminectomy syndrome 2. Chronic postop pain.  PLAN:  Refill oxycodone 5/325 1 p.o. up to 5 times a day 150 with no refill.  Questions were encouraged and answered.  I will see her in a month.     Shemika Robbs L. Blima Dessert     ______________________________ Erick Colace, M.D.   RLW/MedQ D:  06/24/2011 13:43:48  T:  06/24/2011 23:17:39  Job #:  [id number]

## 2011-07-12 ENCOUNTER — Emergency Department (INDEPENDENT_AMBULATORY_CARE_PROVIDER_SITE_OTHER)
Admission: EM | Admit: 2011-07-12 | Discharge: 2011-07-12 | Disposition: A | Discharge status: Home / Self Care | Payer: Medicaid Other | Source: Home / Self Care | Attending: Emergency Medicine | Admitting: Emergency Medicine

## 2011-07-12 DIAGNOSIS — J329 Chronic sinusitis, unspecified: Secondary | ICD-10-CM

## 2011-07-12 HISTORY — DX: Fibromyalgia: M79.7

## 2011-07-12 HISTORY — DX: Unspecified osteoarthritis, unspecified site: M19.90

## 2011-07-12 MED ORDER — AMOXICILLIN-POT CLAVULANATE 875-125 MG PO TABS: 1.0000 | ORAL_TABLET | Freq: Two times a day (BID) | ORAL | Status: AC

## 2011-07-12 NOTE — ED Provider Notes (Signed)
History     CSN: [id number] Arrival date & time: 07/12/2011  5:50 PM   First MD Initiated Contact with Patient 07/12/11 1657      Chief Complaint  Patient presents with  . Sinusitis    Started 5 days ago with sinus pressure, headache, congestion. Denies any fevers.      (Consider location/radiation/quality/duration/timing/severity/associated sxs/prior treatment) HPI Comments: She has had a one-week history of nasal congestion without any drainage, right ear pain, facial pain, headache, swelling of the face, pain in the teeth, sore throat, pain with swallowing, chills, and a slight cough. Her symptoms are getting worse. She has a history of COPD and uses an inhaler for that as needed.  Patient is a 45 y.o. female presenting with sinusitis.  Sinusitis  Associated symptoms include chills, congestion, ear pain, sinus pressure, sore throat and cough. Pertinent negatives include no shortness of breath.    Past Medical History  Diagnosis Date  . Fibromyalgia   . Degenerative joint disease     Past Surgical History  Procedure Date  . Back surgery 1997, 2003  . Abdominal hysterectomy     Family History  Problem Relation Age of Onset  . Hypertension Mother   . Hypertension Father     History  Substance Use Topics  . Smoking status: Former Smoker    Types: Cigarettes  . Smokeless tobacco: Never Used  . Alcohol Use: No    OB History    Grav Para Term Preterm Abortions TAB SAB Ect Mult Living                  Review of Systems  Constitutional: Positive for chills. Negative for fever and fatigue.  HENT: Positive for ear pain, congestion, sore throat, facial swelling, rhinorrhea, sneezing and sinus pressure. Negative for neck stiffness, voice change and postnasal drip.   Eyes: Negative for pain, discharge and redness.  Respiratory: Positive for cough. Negative for chest tightness, shortness of breath and wheezing.   Gastrointestinal: Negative for nausea, vomiting,  abdominal pain and diarrhea.  Skin: Negative for rash.    Allergies  Aspirin; Gabapentin; Nsaids; Pregabalin; and Sulfonamide derivatives  Home Medications   Current Outpatient Rx  Name Route Sig Dispense Refill  . OXYCODONE-ACETAMINOPHEN 5-325 MG PO TABS Oral Take 1 tablet by mouth every 4 (four) hours as needed.      . AMOXICILLIN-POT CLAVULANATE 875-125 MG PO TABS Oral Take 1 tablet by mouth 2 (two) times daily. 20 tablet 0    BP 111/78  Pulse 64  Temp(Src) 98.3 F (36.8 C) (Oral)  Resp 18  SpO2 100%  Physical Exam  Nursing note and vitals reviewed. Constitutional: She appears well-developed and well-nourished. No distress.  HENT:  Head: Normocephalic and atraumatic.  Right Ear: External ear normal.  Left Ear: External ear normal.  Nose: Nose normal.  Mouth/Throat: Oropharynx is clear and moist. No oropharyngeal exudate.       Nasal mucosa is congested. Pharynx is erythematous without drainage or exudate. There was pain to palpation over the frontal and maxillary sinuses.  Eyes: Conjunctivae and EOM are normal. Pupils are equal, round, and reactive to light. Right eye exhibits no discharge. Left eye exhibits no discharge.  Neck: Normal range of motion. Neck supple.  Cardiovascular: Normal rate, regular rhythm and normal heart sounds.   Pulmonary/Chest: Effort normal and breath sounds normal. No stridor. No respiratory distress. She has no wheezes. She has no rales. She exhibits no tenderness.  Lymphadenopathy:    She  has no cervical adenopathy.  Skin: Skin is warm and dry. No rash noted. She is not diaphoretic.    ED Course  Procedures (including critical care time)  Labs Reviewed - No data to display No results found.   1. Sinusitis       MDM  She appears to have symptoms of sinusitis and symptoms are severe and have been going on for over a week, therefore we'll go ahead and treat with an antibiotic.        Roque Lias, MD 07/12/11 [phone]

## 2011-07-12 NOTE — ED Notes (Signed)
Started 5 days ago with sinus pressure, headache, congestion. Denies any fevers.

## 2011-07-19 ENCOUNTER — Encounter (HOSPITAL_COMMUNITY): Payer: Self-pay | Admitting: *Deleted

## 2011-07-19 ENCOUNTER — Emergency Department (HOSPITAL_COMMUNITY)
Admission: EM | Admit: 2011-07-19 | Discharge: 2011-07-19 | Disposition: A | Discharge status: Home / Self Care | Payer: 59 | Attending: Emergency Medicine | Admitting: Emergency Medicine

## 2011-07-19 ENCOUNTER — Emergency Department (HOSPITAL_COMMUNITY): Payer: 59

## 2011-07-19 DIAGNOSIS — R110 Nausea: Secondary | ICD-10-CM | POA: Insufficient documentation

## 2011-07-19 DIAGNOSIS — R1013 Epigastric pain: Secondary | ICD-10-CM | POA: Insufficient documentation

## 2011-07-19 LAB — BASIC METABOLIC PANEL
CO2: 28 mEq/L (ref 19–32)
Calcium: 10 mg/dL (ref 8.4–10.5)
Glucose, Bld: 114 mg/dL — ABNORMAL HIGH (ref 70–99)
Potassium: 4.4 mEq/L (ref 3.5–5.1)

## 2011-07-19 LAB — CBC
Hemoglobin: 14 g/dL (ref 12.0–15.0)
MCH: 29.6 pg (ref 26.0–34.0)
MCHC: 34 g/dL (ref 30.0–36.0)
Platelets: 285 10*3/uL (ref 150–400)
RBC: 4.73 MIL/uL (ref 3.87–5.11)
RDW: 12.3 % (ref 11.5–15.5)

## 2011-07-19 LAB — URINALYSIS, ROUTINE W REFLEX MICROSCOPIC
Glucose, UA: NEGATIVE mg/dL
Hgb urine dipstick: NEGATIVE
Leukocytes, UA: NEGATIVE
Protein, ur: NEGATIVE mg/dL
Specific Gravity, Urine: 1.011 (ref 1.005–1.030)
Urobilinogen, UA: 0.2 mg/dL (ref 0.0–1.0)

## 2011-07-19 LAB — HEPATIC FUNCTION PANEL
ALT: 22 U/L (ref 0–35)
Bilirubin, Direct: <0.1 mg/dL (ref 0.0–0.3)

## 2011-07-19 LAB — DIFFERENTIAL
Basophils Absolute: 0 10*3/uL (ref 0.0–0.1)
Basophils Relative: 0 % (ref 0–1)

## 2011-07-19 MED ORDER — HYDROMORPHONE HCL PF 1 MG/ML IJ SOLN
1.0000 mg | Freq: Once | INTRAMUSCULAR | Status: AC
  Administered 2011-07-19: 1 mg via INTRAVENOUS
  Filled 2011-07-19: qty 1

## 2011-07-19 MED ORDER — SODIUM CHLORIDE 0.9 % IV SOLN
Freq: Once | INTRAVENOUS | Status: AC
  Administered 2011-07-19: 21:00:00 via INTRAVENOUS

## 2011-07-19 MED ORDER — PANTOPRAZOLE SODIUM 20 MG PO TBEC: 40.0000 mg | DELAYED_RELEASE_TABLET | Freq: Every day | ORAL | Status: DC

## 2011-07-19 MED ORDER — ONDANSETRON HCL 4 MG/2ML IJ SOLN
4.0000 mg | Freq: Once | INTRAMUSCULAR | Status: AC
  Administered 2011-07-19: 4 mg via INTRAVENOUS
  Filled 2011-07-19: qty 2

## 2011-07-19 MED ORDER — HYDROMORPHONE HCL 2 MG PO TABS: 2.0000 mg | ORAL_TABLET | Freq: Four times a day (QID) | ORAL | Status: AC | PRN

## 2011-07-19 MED ORDER — IOHEXOL 300 MG/ML  SOLN: 100.0000 mL | Freq: Once | INTRAMUSCULAR | Status: DC | PRN

## 2011-07-19 NOTE — ED Notes (Signed)
Patient transported to CT 

## 2011-07-19 NOTE — ED Notes (Signed)
Pt c/o lower abd pain. Pt ambulatory from triage. No distress noted.

## 2011-07-19 NOTE — ED Provider Notes (Signed)
History     CSN: [id number] Arrival date & time: 07/19/2011  4:28 PM   First MD Initiated Contact with Patient 07/19/11 1929      Chief Complaint  Patient presents with  . Abdominal Pain    (Consider location/radiation/quality/duration/timing/severity/associated sxs/prior treatment) Patient is a 45 y.o. female presenting with abdominal pain. The history is provided by the patient (Patient complains of epigastric abdominal pain with nausea patient states that she's had this for a number of months. The pain was more severe today in the last 2-3 days. She was seen by her medical doctor who sent her over here for evaluation. Patient is ).  Abdominal Pain The primary symptoms of the illness include abdominal pain and nausea. The primary symptoms of the illness do not include fever, fatigue or diarrhea. The current episode started more than 2 days ago. The onset of the illness was gradual. The problem has been gradually worsening.  The patient states that she believes she is currently not pregnant. The patient has not had a change in bowel habit. Symptoms associated with the illness do not include anorexia, diaphoresis, heartburn, hematuria, frequency or back pain.    Past Medical History  Diagnosis Date  . Fibromyalgia   . Degenerative joint disease     Past Surgical History  Procedure Date  . Back surgery 1997, 2003  . Abdominal hysterectomy     Family History  Problem Relation Age of Onset  . Hypertension Mother   . Hypertension Father     History  Substance Use Topics  . Smoking status: Former Smoker    Types: Cigarettes  . Smokeless tobacco: Never Used  . Alcohol Use: No    OB History    Grav Para Term Preterm Abortions TAB SAB Ect Mult Living                  Review of Systems  Constitutional: Negative for fever, diaphoresis and fatigue.  HENT: Negative for congestion, sinus pressure and ear discharge.   Eyes: Negative for discharge.  Respiratory: Negative for  cough.   Cardiovascular: Negative for chest pain.  Gastrointestinal: Positive for nausea and abdominal pain. Negative for heartburn, diarrhea and anorexia.  Genitourinary: Negative for frequency and hematuria.  Musculoskeletal: Negative for back pain.  Skin: Negative for rash.  Neurological: Negative for seizures and headaches.  Hematological: Negative.   Psychiatric/Behavioral: Negative for hallucinations.    Allergies  Aspirin; Gabapentin; Nsaids; Pregabalin; and Sulfonamide derivatives  Home Medications   Current Outpatient Rx  Name Route Sig Dispense Refill  . AMOXICILLIN-POT CLAVULANATE 875-125 MG PO TABS Oral Take 1 tablet by mouth 2 (two) times daily. 20 tablet 0  . B COMPLEX PO TABS Oral Take 1 tablet by mouth daily.      Marland Kitchen OVER THE COUNTER MEDICATION Oral Take 1 tablet by mouth daily. Over the counter magnesium     . OXYCODONE-ACETAMINOPHEN 5-325 MG PO TABS Oral Take 1 tablet by mouth every 4 (four) hours as needed.      Marland Kitchen SAM-E PO Oral Take 1 capsule by mouth daily.      Marland Kitchen HYDROMORPHONE HCL 2 MG PO TABS Oral Take 1 tablet (2 mg total) by mouth every 6 (six) hours as needed for pain. 30 tablet 0  . PANTOPRAZOLE SODIUM 20 MG PO TBEC Oral Take 2 tablets (40 mg total) by mouth daily. 30 tablet 1    BP 90/56  Pulse 61  Temp(Src) 98.4 F (36.9 C) (Oral)  Resp 16  SpO2 98%  Physical Exam  Constitutional: She is oriented to person, place, and time. She appears well-developed.  HENT:  Head: Normocephalic and atraumatic.  Eyes: Conjunctivae and EOM are normal. No scleral icterus.  Neck: Neck supple. No thyromegaly present.  Cardiovascular: Normal rate and regular rhythm.  Exam reveals no gallop and no friction rub.   No murmur heard. Pulmonary/Chest: No stridor. She has no wheezes. She has no rales. She exhibits no tenderness.  Abdominal: She exhibits no distension. There is tenderness. There is no rebound.       Patient has tenderness to epigastric area no rebound  tenderness no masses or hepatosplenomegaly  Musculoskeletal: Normal range of motion. She exhibits no edema.  Lymphadenopathy:    She has no cervical adenopathy.  Neurological: She is oriented to person, place, and time. Coordination normal.  Skin: No rash noted. No erythema.  Psychiatric: She has a normal mood and affect. Her behavior is normal.    ED Course  Procedures (including critical care time)  Labs Reviewed  CBC - Abnormal; Notable for the following:    WBC 11.4 (*)    All other components within normal limits  DIFFERENTIAL - Abnormal; Notable for the following:    Neutro Abs 8.8 (*)    All other components within normal limits  BASIC METABOLIC PANEL - Abnormal; Notable for the following:    Glucose, Bld 114 (*)    All other components within normal limits  HEPATIC FUNCTION PANEL - Abnormal; Notable for the following:    Total Bilirubin 0.2 (*)    All other components within normal limits  URINALYSIS, ROUTINE W REFLEX MICROSCOPIC  LIPASE, BLOOD   Ct Abdomen Pelvis W Contrast  07/19/2011  *RADIOLOGY REPORT*  Clinical Data: Mid abdominal pain, nausea.  CT ABDOMEN AND PELVIS WITH CONTRAST  Technique:  Multidetector CT imaging of the abdomen and pelvis was performed following the standard protocol during bolus administration of intravenous contrast.  Contrast:  100 ml Omnipaque-300  Comparison: 08/08/2009  Findings: 4 mm nodule within the left lower lobe (image 3). Otherwise, images through the lung bases demonstrate no significant appreciable abnormality. The heart size is within normal limits. No pleural or pericardial effusion.  Unremarkable liver, biliary system, spleen, pancreas, adrenal glands.  Symmetric renal enhancement.  No hydronephrosis or hydroureter.  No urinary tract calculi.  No bowel obstruction.  Moderate colonic stool burden.  Normal appendix.  No free intraperitoneal air.  Trace free fluid within the pelvis. No lymphadenopathy.  Normal caliber vasculature.   Minimal fat stranding adjacent to the fat containing umbilical hernia is similar to prior.  Thin-walled bladder.  Uterus not visualized.  No adnexal mass.  Postsurgical changes of the lumbosacral spine.  No acute osseous abnormality.  IMPRESSION: No acute intra-abdominal process identified.  4 mm nodule within the left lower lobe. If the patient is at high risk for bronchogenic carcinoma, follow-up chest CT at 1 year is recommended.  If the patient is at low risk, no follow-up is needed.  This recommendation follows the consensus statement: Guidelines for Management of Small Pulmonary Nodules Detected on CT Scans:  A Statement from the Fleischner Society as published in Radiology 2005; 237:395-400.  Available online at: [email or web address].cz.  Original Report Authenticated By: Waneta Martins, M.D.   Results for orders placed during the hospital encounter of 07/19/11  CBC      Component Value Range   WBC 11.4 (*) 4.0 - 10.5 (K/uL)   RBC 4.73  3.87 - 5.11 (  MIL/uL)   Hemoglobin 14.0  12.0 - 15.0 (g/dL)   HCT 16.1  09.6 - 04.5 (%)   MCV 87.1  78.0 - 100.0 (fL)   MCH 29.6  26.0 - 34.0 (pg)   MCHC 34.0  30.0 - 36.0 (g/dL)   RDW 40.9  81.1 - 91.4 (%)   Platelets 285  150 - 400 (K/uL)  DIFFERENTIAL      Component Value Range   Neutrophils Relative 77  43 - 77 (%)   Neutro Abs 8.8 (*) 1.7 - 7.7 (K/uL)   Lymphocytes Relative 15  12 - 46 (%)   Lymphs Abs 1.7  0.7 - 4.0 (K/uL)   Monocytes Relative 6  3 - 12 (%)   Monocytes Absolute 0.7  0.1 - 1.0 (K/uL)   Eosinophils Relative 2  0 - 5 (%)   Eosinophils Absolute 0.2  0.0 - 0.7 (K/uL)   Basophils Relative 0  0 - 1 (%)   Basophils Absolute 0.0  0.0 - 0.1 (K/uL)  BASIC METABOLIC PANEL      Component Value Range   Sodium 139  135 - 145 (mEq/L)   Potassium 4.4  3.5 - 5.1 (mEq/L)   Chloride 103  96 - 112 (mEq/L)   CO2 28  19 - 32 (mEq/L)   Glucose, Bld 114 (*) 70 - 99 (mg/dL)   BUN 18  6 - 23 (mg/dL)   Creatinine,  Ser 7.82  0.50 - 1.10 (mg/dL)   Calcium 95.6  8.4 - 10.5 (mg/dL)   GFR calc non Af Amer >90  >90 (mL/min)   GFR calc Af Amer >90  >90 (mL/min)  URINALYSIS, ROUTINE W REFLEX MICROSCOPIC      Component Value Range   Color, Urine YELLOW  YELLOW    Appearance CLEAR  CLEAR    Specific Gravity, Urine 1.011  1.005 - 1.030    pH 8.0  5.0 - 8.0    Glucose, UA NEGATIVE  NEGATIVE (mg/dL)   Hgb urine dipstick NEGATIVE  NEGATIVE    Bilirubin Urine NEGATIVE  NEGATIVE    Ketones, ur NEGATIVE  NEGATIVE (mg/dL)   Protein, ur NEGATIVE  NEGATIVE (mg/dL)   Urobilinogen, UA 0.2  0.0 - 1.0 (mg/dL)   Nitrite NEGATIVE  NEGATIVE    Leukocytes, UA NEGATIVE  NEGATIVE   HEPATIC FUNCTION PANEL      Component Value Range   Total Protein 7.3  6.0 - 8.3 (g/dL)   Albumin 4.0  3.5 - 5.2 (g/dL)   AST 28  0 - 37 (U/L)   ALT 22  0 - 35 (U/L)   Alkaline Phosphatase 79  39 - 117 (U/L)   Total Bilirubin 0.2 (*) 0.3 - 1.2 (mg/dL)   Bilirubin, Direct <2.1  0.0 - 0.3 (mg/dL)   Indirect Bilirubin NOT CALCULATED  0.3 - 0.9 (mg/dL)  LIPASE, BLOOD      Component Value Range   Lipase 26  11 - 59 (U/L)   Dg Clavicle Right  06/24/2011  *RADIOLOGY REPORT*  Clinical Data: Clavicular enlargement.  Palpable lump near sternoclavicular joint.  RIGHT CLAVICLE - 2+ VIEWS  Comparison:  None.  Findings:  There is no evidence of fracture or other focal bone lesions.  Soft tissues are unremarkable.  IMPRESSION: Negative.  Original Report Authenticated By: Danae Orleans, M.D.   Ct Abdomen Pelvis W Contrast  07/19/2011  *RADIOLOGY REPORT*  Clinical Data: Mid abdominal pain, nausea.  CT ABDOMEN AND PELVIS WITH CONTRAST  Technique:  Multidetector CT  imaging of the abdomen and pelvis was performed following the standard protocol during bolus administration of intravenous contrast.  Contrast:  100 ml Omnipaque-300  Comparison: 08/08/2009  Findings: 4 mm nodule within the left lower lobe (image 3). Otherwise, images through the lung bases  demonstrate no significant appreciable abnormality. The heart size is within normal limits. No pleural or pericardial effusion.  Unremarkable liver, biliary system, spleen, pancreas, adrenal glands.  Symmetric renal enhancement.  No hydronephrosis or hydroureter.  No urinary tract calculi.  No bowel obstruction.  Moderate colonic stool burden.  Normal appendix.  No free intraperitoneal air.  Trace free fluid within the pelvis. No lymphadenopathy.  Normal caliber vasculature.  Minimal fat stranding adjacent to the fat containing umbilical hernia is similar to prior.  Thin-walled bladder.  Uterus not visualized.  No adnexal mass.  Postsurgical changes of the lumbosacral spine.  No acute osseous abnormality.  IMPRESSION: No acute intra-abdominal process identified.  4 mm nodule within the left lower lobe. If the patient is at high risk for bronchogenic carcinoma, follow-up chest CT at 1 year is recommended.  If the patient is at low risk, no follow-up is needed.  This recommendation follows the consensus statement: Guidelines for Management of Small Pulmonary Nodules Detected on CT Scans:  A Statement from the Fleischner Society as published in Radiology 2005; 237:395-400.  Available online at: [email or web address].cz.  Original Report Authenticated By: Waneta Martins, M.D.   Dg Shoulder Left  06/24/2011  *RADIOLOGY REPORT*  Clinical Data: Left shoulder pain.  LEFT SHOULDER - 2+ VIEW  Comparison:  None.  Findings:  There is no evidence of fracture or dislocation.  There is no evidence of arthropathy or other focal bone abnormality. Soft tissues are unremarkable.  IMPRESSION: Negative.  Original Report Authenticated By: Danae Orleans, M.D.      1. Abdominal pain       MDM  abd pain,  Gastritis,  Exacerbation of chronic abd pain        Benny Lennert, MD 07/19/11 2318

## 2011-07-19 NOTE — ED Notes (Signed)
The pt is c/o lower abd pain since the middle of October.  lmp hysterectomy.  The pain has increased in the past week

## 2011-07-19 NOTE — ED Notes (Signed)
Pt returned from CT °

## 2011-07-20 ENCOUNTER — Other Ambulatory Visit (HOSPITAL_COMMUNITY): Payer: Self-pay | Admitting: Emergency Medicine

## 2011-07-20 DIAGNOSIS — K829 Disease of gallbladder, unspecified: Secondary | ICD-10-CM

## 2011-07-21 ENCOUNTER — Ambulatory Visit: Payer: PRIVATE HEALTH INSURANCE | Admitting: Neurosurgery

## 2011-07-21 ENCOUNTER — Encounter: Payer: 59 | Admitting: Neurosurgery

## 2011-07-26 ENCOUNTER — Encounter: Payer: Medicare Other | Attending: Neurosurgery | Admitting: Neurosurgery

## 2011-07-26 DIAGNOSIS — G8928 Other chronic postprocedural pain: Secondary | ICD-10-CM

## 2011-07-26 DIAGNOSIS — M961 Postlaminectomy syndrome, not elsewhere classified: Secondary | ICD-10-CM | POA: Insufficient documentation

## 2011-07-26 DIAGNOSIS — M545 Low back pain, unspecified: Secondary | ICD-10-CM | POA: Insufficient documentation

## 2011-07-26 NOTE — Assessment & Plan Note (Signed)
This is a patient of Dr. Wynn Banker, seen for low back pain, here at [facility].  However, she has had some acute abdominal problem. She went to the emergency room last Monday and was given Dilaudid 2 mg q.6 hours for pain.  She has got an ultrasound so that the [facility] for tomorrow and followup with [facility] for an endoscopy for the December 4.  Rates her average pain at 8.  She states her abdominal pains are worse, problem right now is regular. General activity is an 8.  The pain is same 24 hours a day.  Sleep patterns are poor.  Pain is worse with all activity.  Medication helps some.  She walks without assistance.  Functionally, she is unemployed.  REVIEW OF SYSTEMS:  Notable for difficulties as described above as well as some fever, chills, night sweats, nausea, abdominal pain that is acute.  Last UDS and pill count was correct.  PAST MEDICAL HISTORY:  Unchanged.  SOCIAL HISTORY:  Unchanged.  FAMILY HISTORY:  Unchanged.  PHYSICAL EXAMINATION:  Her blood pressure is 112/78, pulse 85, respirations 18, O2 sats 97% on room air.  Motor strength and sensation are intact.  Constitutionally, she is within normal limits.  She is alert and oriented x3.  She has normal gait.  ASSESSMENT: 1. Post laminectomy syndrome. 2. Chronic postop pain. 3. Acute abdominal pain, unknown etiology.  PLAN: 1. Refill oxycodone 5/325, 1 p.o. up to 5 times a day, 150 with no     refill. 2. For a 1 time prescription, I did give her Dilaudid 2 mg 1 p.o. q.8     hours p.r.n., 30 with no refill.  She understands that a 1 time     prescription to get her through until she sees [facility].  Her     questions were encouraged and answered.  We will see her in a     month.     Kellyn Mansfield L. Blima Dessert Electronically Signed    RLW/MedQ D:  07/26/2011 14:57:56  T:  07/26/2011 23:24:09  Job #:  [id number]

## 2011-07-27 ENCOUNTER — Telehealth: Payer: Self-pay | Admitting: Gastroenterology

## 2011-07-27 ENCOUNTER — Ambulatory Visit (HOSPITAL_COMMUNITY)
Admission: RE | Admit: 2011-07-27 | Discharge: 2011-07-27 | Disposition: A | Discharge status: Home / Self Care | Payer: Medicare Other | Source: Ambulatory Visit | Attending: Emergency Medicine | Admitting: Emergency Medicine

## 2011-07-27 DIAGNOSIS — K829 Disease of gallbladder, unspecified: Secondary | ICD-10-CM

## 2011-07-27 DIAGNOSIS — R109 Unspecified abdominal pain: Secondary | ICD-10-CM | POA: Insufficient documentation

## 2011-07-27 NOTE — Telephone Encounter (Signed)
Pt has not had an appt with Dr Christella Hartigan and would not be able to give her any results  She should call the ER physician that saw her

## 2011-08-03 ENCOUNTER — Ambulatory Visit (INDEPENDENT_AMBULATORY_CARE_PROVIDER_SITE_OTHER): Payer: Medicare Other | Admitting: Gastroenterology

## 2011-08-03 ENCOUNTER — Other Ambulatory Visit: Payer: Self-pay | Admitting: Gastroenterology

## 2011-08-03 ENCOUNTER — Encounter: Payer: Self-pay | Admitting: Gastroenterology

## 2011-08-03 VITALS — BP 118/84 | HR 73 | Ht 64.0 in | Wt 153.5 lb

## 2011-08-03 DIAGNOSIS — R109 Unspecified abdominal pain: Secondary | ICD-10-CM

## 2011-08-03 MED ORDER — PANTOPRAZOLE SODIUM 20 MG PO TBEC: 40.0000 mg | DELAYED_RELEASE_TABLET | Freq: Every day | ORAL | Status: DC

## 2011-08-03 NOTE — Progress Notes (Signed)
HPI: This is a   pleasant 45-year-old woman who is here with 2 of her daughters today.  Has had years of intermittent right sided pains.  For years.  6-7 weeks epigastric pain now.  THe new pain is constant but waxes and wanes.  She has 3 BM a week normal for her.  Pain is also associated with nausea that is new.  No vomiting.  Symptoms are not related to eating.   She had an abdominal ultrasound as well as a CT scan and labs for this pain. Complete metabolic profile was normal, CBC showed a very slightly elevated white count otherwise was normal. CT scan was normal in the abdomen. She had the ultrasound which showed a 6 mm bile duct. Her liver tests were normal.  She sees pain management, on dilauded and percocets.  Has been on narcs for many years for "neuropathy, OA, fibromyalgia."  Takes no nsaids or ASA.  She had a colonoscopy in 1980's 1990s, was normal.  Her cousin had colon cancer.  Started ppi 2-3 weeks ago.   Review of systems: Pertinent positive and negative review of systems were noted in the above HPI section. Complete review of systems was performed and was otherwise normal.    Past Medical History  Diagnosis Date  . Fibromyalgia   . Degenerative joint disease   . Anxiety   . Osteoarthritis   . Depression   . Hyperlipidemia     Past Surgical History  Procedure Date  . Back surgery 1997, 2003  . Abdominal hysterectomy   . Cystectomy     Current Outpatient Prescriptions  Medication Sig Dispense Refill  . b complex vitamins tablet Take 1 tablet by mouth daily.        . HYDROmorphone (DILAUDID) 2 MG tablet Take 2 mg by mouth every 4 (four) hours as needed.        . OVER THE COUNTER MEDICATION Take 1 tablet by mouth daily. Over the counter magnesium       . oxyCODONE-acetaminophen (PERCOCET) 5-325 MG per tablet Take 1 tablet by mouth every 4 (four) hours as needed.        . pantoprazole (PROTONIX) 20 MG tablet Take 2 tablets (40 mg total) by mouth daily.  30  tablet  1  . S-Adenosylmethionine (SAM-E PO) Take 1 capsule by mouth daily.          Allergies as of 08/03/2011 - Review Complete 08/03/2011  Allergen Reaction Noted  . Aspirin  07/22/2008  . Gabapentin  07/22/2008  . Nsaids  07/22/2008  . Pregabalin  10/22/2008  . Sulfonamide derivatives  07/22/2008    Family History  Problem Relation Age of Onset  . Hypertension Mother   . Hypertension Father   . Breast cancer Maternal Grandmother   . Ovarian cancer Cousin   . Liver cancer Maternal Grandmother   . Diabetes Maternal Grandfather     both sides    History   Social History  . Marital Status: Married    Spouse Name: N/A    Number of Children: 4  . Years of Education: N/A   Occupational History  . disabled    Social History Main Topics  . Smoking status: Former Smoker    Types: Cigarettes  . Smokeless tobacco: Never Used  . Alcohol Use: No  . Drug Use: No  . Sexually Active:    Other Topics Concern  . Not on file   Social History Narrative  . No narrative on file         Physical Exam: BP 118/84  Pulse 73  Ht 5' 4" (1.626 m)  Wt 153 lb 8 oz (69.627 kg)  BMI 26.35 kg/m2  SpO2 98% Constitutional: generally well-appearing Psychiatric: alert and oriented x3 Eyes: extraocular movements intact Mouth: oral pharynx moist, no lesions Neck: supple no lymphadenopathy Cardiovascular: heart regular rate and rhythm Lungs: clear to auscultation bilaterally Abdomen: soft, minor epigastric tenderness, nondistended, no obvious ascites, no peritoneal signs, normal bowel sounds Extremities: no lower extremity edema bilaterally Skin: no lesions on visible extremities    Assessment and plan: 45 y.o. female with  epigastric pain, tenderness with normal ultrasound, normal CT scan, normal CBC, normal complete metabolic profile  It is not clear what is causing her pains.  She has gastritis or peptic ulcer disease. She is on chronic narcotic pain medicines which confounds  this greatly.  I think we should proceed with EGD using propofol for sedation at her soonest convenience. I have refilled her photon pump inhibitor but I am not going to prescribe her narcotic pain medicines for this. She is already seen at pain clinic.     

## 2011-08-03 NOTE — Patient Instructions (Signed)
You will be set up for an upper endoscopy, at [facility] with propofol given your chronic narcotic pain meds. A copy of this information will be made available to Dr. Tomi Bamberger. Refill of protonix given, take 1 pill 20-30 min before BF meal daily.

## 2011-08-17 ENCOUNTER — Encounter (HOSPITAL_COMMUNITY): Payer: Self-pay | Admitting: *Deleted

## 2011-08-17 ENCOUNTER — Encounter (HOSPITAL_COMMUNITY)
Admission: RE | Admit: 2011-08-17 | Discharge: 2011-08-17 | Disposition: A | Discharge status: Home / Self Care | Payer: Medicare Other | Source: Ambulatory Visit | Attending: Gastroenterology | Admitting: Gastroenterology

## 2011-08-17 NOTE — Anesthesia Preprocedure Evaluation (Addendum)
Anesthesia Evaluation  Patient identified by MRN, date of birth, ID band Patient awake    Reviewed: Allergy & Precautions, H&P , NPO status , Patient's Chart, lab work & pertinent test results, reviewed documented beta blocker date and time   Airway Mallampati: II TM Distance: >3 FB Neck ROM: full    Dental No notable dental hx. (+) Teeth Intact and Dental Advidsory Given   Pulmonary asthma ,  clear to auscultation  Pulmonary exam normal       Cardiovascular Exercise Tolerance: Good neg cardio ROS regular Normal    Neuro/Psych Fibromyalgia, Rx with over the counter meds Chronic low back pain with spinal fusion of L4-5, takes po Percocet X5 per day (5/325)  Neuromuscular disease Negative Neurological ROS  Negative Psych ROS   GI/Hepatic negative GI ROS, Neg liver ROS, Severe abdominal pain of undetermined etiology.   Endo/Other  Negative Endocrine ROSDiabetes mellitus-Hx Hypoglycemia  Renal/GU negative Renal ROS  Genitourinary negative   Musculoskeletal   Abdominal   Peds  Hematology negative hematology ROS (+)   Anesthesia Other Findings   Reproductive/Obstetrics negative OB ROS                          Anesthesia Physical Anesthesia Plan  ASA: II  Anesthesia Plan:    Post-op Pain Management:    Induction:   Airway Management Planned:   Additional Equipment:   Intra-op Plan:   Post-operative Plan:   Informed Consent: I have reviewed the patients History and Physical, chart, labs and discussed the procedure including the risks, benefits and alternatives for the proposed anesthesia with the patient or authorized representative who has indicated his/her understanding and acceptance.   Dental Advisory Given  Plan Discussed with: CRNA  Anesthesia Plan Comments:         Anesthesia Quick Evaluation

## 2011-08-18 ENCOUNTER — Encounter: Payer: Medicare Other | Attending: Neurosurgery | Admitting: Neurosurgery

## 2011-08-18 DIAGNOSIS — G8928 Other chronic postprocedural pain: Secondary | ICD-10-CM

## 2011-08-18 DIAGNOSIS — M961 Postlaminectomy syndrome, not elsewhere classified: Secondary | ICD-10-CM | POA: Insufficient documentation

## 2011-08-18 DIAGNOSIS — R52 Pain, unspecified: Secondary | ICD-10-CM | POA: Insufficient documentation

## 2011-08-18 DIAGNOSIS — R109 Unspecified abdominal pain: Secondary | ICD-10-CM | POA: Insufficient documentation

## 2011-08-19 ENCOUNTER — Encounter (HOSPITAL_COMMUNITY): Payer: Self-pay | Admitting: Anesthesiology

## 2011-08-19 ENCOUNTER — Encounter (HOSPITAL_COMMUNITY)
Admission: RE | Disposition: A | Discharge status: Home / Self Care | Payer: Self-pay | Source: Ambulatory Visit | Attending: Gastroenterology

## 2011-08-19 ENCOUNTER — Encounter (HOSPITAL_COMMUNITY): Payer: Self-pay | Admitting: *Deleted

## 2011-08-19 ENCOUNTER — Ambulatory Visit (HOSPITAL_COMMUNITY): Payer: Medicare Other | Admitting: Registered Nurse

## 2011-08-19 ENCOUNTER — Ambulatory Visit: Payer: 59 | Admitting: Neurosurgery

## 2011-08-19 ENCOUNTER — Ambulatory Visit (HOSPITAL_COMMUNITY)
Admission: RE | Admit: 2011-08-19 | Discharge: 2011-08-19 | Disposition: A | Discharge status: Home / Self Care | Payer: Medicare Other | Source: Ambulatory Visit | Attending: Gastroenterology | Admitting: Gastroenterology

## 2011-08-19 ENCOUNTER — Other Ambulatory Visit: Payer: Self-pay | Admitting: Gastroenterology

## 2011-08-19 DIAGNOSIS — K3189 Other diseases of stomach and duodenum: Secondary | ICD-10-CM

## 2011-08-19 DIAGNOSIS — Z79899 Other long term (current) drug therapy: Secondary | ICD-10-CM | POA: Insufficient documentation

## 2011-08-19 DIAGNOSIS — E785 Hyperlipidemia, unspecified: Secondary | ICD-10-CM | POA: Insufficient documentation

## 2011-08-19 DIAGNOSIS — M1990 Unspecified osteoarthritis, unspecified site: Secondary | ICD-10-CM | POA: Insufficient documentation

## 2011-08-19 DIAGNOSIS — IMO0001 Reserved for inherently not codable concepts without codable children: Secondary | ICD-10-CM | POA: Insufficient documentation

## 2011-08-19 DIAGNOSIS — K2970 Gastritis, unspecified, without bleeding: Secondary | ICD-10-CM

## 2011-08-19 DIAGNOSIS — R1013 Epigastric pain: Secondary | ICD-10-CM | POA: Insufficient documentation

## 2011-08-19 DIAGNOSIS — K2990 Gastroduodenitis, unspecified, without bleeding: Secondary | ICD-10-CM

## 2011-08-19 DIAGNOSIS — R110 Nausea: Secondary | ICD-10-CM | POA: Insufficient documentation

## 2011-08-19 DIAGNOSIS — R109 Unspecified abdominal pain: Secondary | ICD-10-CM

## 2011-08-19 DIAGNOSIS — K319 Disease of stomach and duodenum, unspecified: Secondary | ICD-10-CM | POA: Insufficient documentation

## 2011-08-19 HISTORY — DX: Eructation: R14.2

## 2011-08-19 SURGERY — ESOPHAGOGASTRODUODENOSCOPY (EGD) WITH PROPOFOL
Anesthesia: Monitor Anesthesia Care

## 2011-08-19 MED ORDER — PROPOFOL 10 MG/ML IV EMUL
INTRAVENOUS | Status: DC | PRN
  Administered 2011-08-19: 75 ug/kg/min via INTRAVENOUS

## 2011-08-19 MED ORDER — MIDAZOLAM HCL 5 MG/5ML IJ SOLN
INTRAMUSCULAR | Status: DC | PRN
  Administered 2011-08-19: 2 mg via INTRAVENOUS

## 2011-08-19 MED ORDER — LACTATED RINGERS IV SOLN
INTRAVENOUS | Status: DC | PRN
  Administered 2011-08-19: 07:00:00 via INTRAVENOUS

## 2011-08-19 MED ORDER — PROPOFOL 10 MG/ML IV EMUL
INTRAVENOUS | Status: DC | PRN
  Administered 2011-08-19: 15 mg via INTRAVENOUS
  Administered 2011-08-19 (×2): 50 mg via INTRAVENOUS
  Administered 2011-08-19: 35 mg via INTRAVENOUS

## 2011-08-19 MED ORDER — LIDOCAINE HCL 1 % IJ SOLN
INTRAMUSCULAR | Status: DC | PRN
  Administered 2011-08-19: 75 mg via INTRADERMAL

## 2011-08-19 MED ORDER — FENTANYL CITRATE 0.05 MG/ML IJ SOLN
INTRAMUSCULAR | Status: DC | PRN
  Administered 2011-08-19: 50 ug via INTRAVENOUS

## 2011-08-19 NOTE — Op Note (Signed)
[facility] [address] [place], [place]  [address]  ENDOSCOPY PROCEDURE REPORT  PATIENT:  Cindy Pratt, Cindy Pratt  MR#:  [id number] BIRTHDATE:  13-Aug-1966, 45 yrs. old  GENDER:  female ENDOSCOPIST:  Rachael Fee, MD Referred by:  Tomi Bamberger, NP PROCEDURE DATE:  08/19/2011 PROCEDURE:  EGD with biopsy, 86578 ASA CLASS:  Class II INDICATIONS:  abominal pain:  She had an abdominal ultrasound as well as a CT scan and labs for this pain. Complete metabolic profile was normal, CBC showed a very slightly elevated white count otherwise was normal. CT scan was normal in the abdomen. She had the ultrasound which showed a 6 mm bile duct. Her liver tests were normal.  MEDICATIONS:  MAC sedation, administered by CRNA TOPICAL ANESTHETIC:  Cetacaine Spray DESCRIPTION OF PROCEDURE:   After the risks benefits and alternatives of the procedure were thoroughly explained, informed consent was obtained.  The  endoscope was introduced through the mouth and advanced to the second portion of the duodenum, without limitations.  The instrument was slowly withdrawn as the mucosa was fully examined. <<PROCEDUREIMAGES>> There was mild, non-specific gastritis. This was biopsied and sent to pathology (jar 1) (see image1).  Otherwise the examination was normal (see image2, image3, image4, image5, image7, and image6). Retroflexed views revealed no abnormalities.    The scope was then withdrawn from the patient and the procedure completed. COMPLICATIONS:  None ENDOSCOPIC IMPRESSION: 1) Mild gastritis, biopsied to check for H. pylori 2) Otherwise normal examination  RECOMMENDATIONS: Continue on PPI once daily. If biopsies show H. pylori you will be started on the appropriate antibiotics. Pain medicines via pain clinic.  _____________________________  Rachael Fee, MD  n. eSIGNED:   Rachael Fee at 08/19/2011 07:44 AM  Lesia Sago, [phone]

## 2011-08-19 NOTE — Assessment & Plan Note (Signed)
The patient of Dr. Wynn Banker seen for low back pain.  She rates unchanged at an 8.  General activity level is 10.  Pain is same 24 hours a day.  Sleep patterns are poor.  She does not indicate what worsens or helps her pain.  Mobility, she walks independently.  Functionally she is unemployed.  REVIEW OF SYSTEMS:  Notable for difficulties described above as well as nausea, abdominal pain, tingling as well as depression, anxiety.  No suicidal thoughts or aberrant behaviors.  Last pill count and UDS were consistent.  She is having upper GI endoscopy with [facility] to investigate her GI problems tomorrow.  PAST MEDICAL HISTORY/SOCIAL HISTORY/FAMILY HISTORY:  Unchanged.  PHYSICAL EXAMINATION:  Her blood pressure is 99/71, pulse 71, respirations 18, O2 sats 97 on room air.  Her motor strength and sensation are intact in upper and lower extremities.  Constitutionally she is within normal limits.  Her affect is bright and alert.  She has a normal gait.  ASSESSMENT: 1. Post laminectomy syndrome. 2. Chronic pain postoperatively. 3. Acute abdominal pain, unknown etiology.  Following up with [facility] tomorrow.  PLAN:  Refill oxycodone 5/325, 1 p.o. up to 5 times a day, #150 with no refill.  Her questions were encouraged and answered.  We will see her in a month.     Emmilynn Marut L. Blima Dessert Electronically Signed    RLW/MedQ D:  08/18/2011 10:27:37  T:  08/19/2011 04:44:16  Job #:  [id number]

## 2011-08-19 NOTE — Transfer of Care (Signed)
Immediate Anesthesia Transfer of Care Note  Patient: Cindy Pratt  Procedure(s) Performed:  ESOPHAGOGASTRODUODENOSCOPY (EGD) WITH PROPOFOL  Patient Location: PACU  Anesthesia Type: MAC  Level of Consciousness: awake, alert , oriented and patient cooperative  Airway & Oxygen Therapy: Patient Spontanous Breathing and Patient connected to nasal cannula oxygen  Post-op Assessment: Report given to PACU RN and Post -op Vital signs reviewed and stable  Post vital signs: stable  Complications: No apparent anesthesia complications

## 2011-08-19 NOTE — Interval H&P Note (Signed)
History and Physical Interval Note:  08/19/2011 7:08 AM  Cindy Pratt  has presented today for surgery, with the diagnosis of Abdominal pain [789.00]  The various methods of treatment have been discussed with the patient and family. After consideration of risks, benefits and other options for treatment, the patient has consented to  Procedure(s): ESOPHAGOGASTRODUODENOSCOPY (EGD) WITH PROPOFOL as a surgical intervention .  The patients' history has been reviewed, patient examined, no change in status, stable for surgery.  I have reviewed the patients' chart and labs.  Questions were answered to the patient's satisfaction.     Rob Bunting

## 2011-08-19 NOTE — Anesthesia Postprocedure Evaluation (Signed)
  Anesthesia Post-op Note  Patient: Cindy Pratt  Procedure(s) Performed:  ESOPHAGOGASTRODUODENOSCOPY (EGD) WITH PROPOFOL  Patient Location: PACU  Anesthesia Type: MAC  Level of Consciousness: oriented and sedated  Airway and Oxygen Therapy: Patient Spontanous Breathing  Post-op Pain: mild  Post-op Assessment: Post-op Vital signs reviewed, Patient's Cardiovascular Status Stable, Respiratory Function Stable and Patent Airway  Post-op Vital Signs: stable  Complications: No apparent anesthesia complications

## 2011-08-19 NOTE — H&P (View-Only) (Signed)
HPI: This is a   pleasant 45 year old woman who is here with 2 of her daughters today.  Has had years of intermittent right sided pains.  For years.  6-7 weeks epigastric pain now.  THe new pain is constant but waxes and wanes.  She has 3 BM a week normal for her.  Pain is also associated with nausea that is new.  No vomiting.  Symptoms are not related to eating.   She had an abdominal ultrasound as well as a CT scan and labs for this pain. Complete metabolic profile was normal, CBC showed a very slightly elevated white count otherwise was normal. CT scan was normal in the abdomen. She had the ultrasound which showed a 6 mm bile duct. Her liver tests were normal.  She sees pain management, on dilauded and percocets.  Has been on narcs for many years for "neuropathy, OA, fibromyalgia."  Takes no nsaids or ASA.  She had a colonoscopy in 1980's 1990s, was normal.  Her cousin had colon cancer.  Started ppi 2-3 weeks ago.   Review of systems: Pertinent positive and negative review of systems were noted in the above HPI section. Complete review of systems was performed and was otherwise normal.    Past Medical History  Diagnosis Date  . Fibromyalgia   . Degenerative joint disease   . Anxiety   . Osteoarthritis   . Depression   . Hyperlipidemia     Past Surgical History  Procedure Date  . Back surgery 1997, 2003  . Abdominal hysterectomy   . Cystectomy     Current Outpatient Prescriptions  Medication Sig Dispense Refill  . b complex vitamins tablet Take 1 tablet by mouth daily.        Marland Kitchen HYDROmorphone (DILAUDID) 2 MG tablet Take 2 mg by mouth every 4 (four) hours as needed.        Marland Kitchen OVER THE COUNTER MEDICATION Take 1 tablet by mouth daily. Over the counter magnesium       . oxyCODONE-acetaminophen (PERCOCET) 5-325 MG per tablet Take 1 tablet by mouth every 4 (four) hours as needed.        . pantoprazole (PROTONIX) 20 MG tablet Take 2 tablets (40 mg total) by mouth daily.  30  tablet  1  . S-Adenosylmethionine (SAM-E PO) Take 1 capsule by mouth daily.          Allergies as of 08/03/2011 - Review Complete 08/03/2011  Allergen Reaction Noted  . Aspirin  07/22/2008  . Gabapentin  07/22/2008  . Nsaids  07/22/2008  . Pregabalin  10/22/2008  . Sulfonamide derivatives  07/22/2008    Family History  Problem Relation Age of Onset  . Hypertension Mother   . Hypertension Father   . Breast cancer Maternal Grandmother   . Ovarian cancer Cousin   . Liver cancer Maternal Grandmother   . Diabetes Maternal Grandfather     both sides    History   Social History  . Marital Status: Married    Spouse Name: N/A    Number of Children: 4  . Years of Education: N/A   Occupational History  . disabled    Social History Main Topics  . Smoking status: Former Smoker    Types: Cigarettes  . Smokeless tobacco: Never Used  . Alcohol Use: No  . Drug Use: No  . Sexually Active:    Other Topics Concern  . Not on file   Social History Narrative  . No narrative on file  Physical Exam: BP 118/84  Pulse 73  Ht 5\' 4"  (1.626 m)  Wt 153 lb 8 oz (69.627 kg)  BMI 26.35 kg/m2  SpO2 98% Constitutional: generally well-appearing Psychiatric: alert and oriented x3 Eyes: extraocular movements intact Mouth: oral pharynx moist, no lesions Neck: supple no lymphadenopathy Cardiovascular: heart regular rate and rhythm Lungs: clear to auscultation bilaterally Abdomen: soft, minor epigastric tenderness, nondistended, no obvious ascites, no peritoneal signs, normal bowel sounds Extremities: no lower extremity edema bilaterally Skin: no lesions on visible extremities    Assessment and plan: 45 y.o. female with  epigastric pain, tenderness with normal ultrasound, normal CT scan, normal CBC, normal complete metabolic profile  It is not clear what is causing her pains.  She has gastritis or peptic ulcer disease. She is on chronic narcotic pain medicines which confounds  this greatly.  I think we should proceed with EGD using propofol for sedation at her soonest convenience. I have refilled her photon pump inhibitor but I am not going to prescribe her narcotic pain medicines for this. She is already seen at pain clinic.

## 2011-08-25 ENCOUNTER — Telehealth: Payer: Self-pay | Admitting: Gastroenterology

## 2011-08-25 NOTE — Telephone Encounter (Signed)
Ok, thanks.

## 2011-08-25 NOTE — Telephone Encounter (Signed)
Pt aware and results given to the pt she is going to change her protonix to nightly because her symptoms are worse at night

## 2011-09-15 ENCOUNTER — Encounter: Payer: Medicare Other | Attending: Neurosurgery | Admitting: Neurosurgery

## 2011-09-15 DIAGNOSIS — R52 Pain, unspecified: Secondary | ICD-10-CM | POA: Insufficient documentation

## 2011-09-15 DIAGNOSIS — G8928 Other chronic postprocedural pain: Secondary | ICD-10-CM | POA: Insufficient documentation

## 2011-09-15 DIAGNOSIS — M961 Postlaminectomy syndrome, not elsewhere classified: Secondary | ICD-10-CM | POA: Insufficient documentation

## 2011-09-15 DIAGNOSIS — R109 Unspecified abdominal pain: Secondary | ICD-10-CM | POA: Insufficient documentation

## 2011-09-24 ENCOUNTER — Other Ambulatory Visit: Payer: Self-pay | Admitting: Internal Medicine

## 2011-09-24 DIAGNOSIS — N6459 Other signs and symptoms in breast: Secondary | ICD-10-CM

## 2011-09-29 ENCOUNTER — Other Ambulatory Visit: Payer: Self-pay | Admitting: Internal Medicine

## 2011-09-29 ENCOUNTER — Ambulatory Visit
Admission: RE | Admit: 2011-09-29 | Discharge: 2011-09-29 | Disposition: A | Discharge status: Home / Self Care | Payer: Medicare Other | Source: Ambulatory Visit | Attending: Internal Medicine | Admitting: Internal Medicine

## 2011-09-29 DIAGNOSIS — R222 Localized swelling, mass and lump, trunk: Secondary | ICD-10-CM

## 2011-09-29 DIAGNOSIS — N6459 Other signs and symptoms in breast: Secondary | ICD-10-CM

## 2011-09-30 ENCOUNTER — Inpatient Hospital Stay
Admission: RE | Admit: 2011-09-30 | Discharge: 2011-09-30 | Payer: Medicare Other | Source: Ambulatory Visit | Attending: Internal Medicine | Admitting: Internal Medicine

## 2011-10-01 ENCOUNTER — Telehealth: Payer: Self-pay | Admitting: Gastroenterology

## 2011-10-01 MED ORDER — ESOMEPRAZOLE MAGNESIUM 40 MG PO CPDR: 40.0000 mg | DELAYED_RELEASE_CAPSULE | Freq: Two times a day (BID) | ORAL | Status: DC

## 2011-10-01 NOTE — Telephone Encounter (Signed)
Pt is taking protonix and does not feel like it works wants to try nexium.  Nexium will be sent.  She will call if no response in 1-2 weeks.

## 2011-10-01 NOTE — Telephone Encounter (Signed)
Left message on machine to call back  

## 2011-10-04 ENCOUNTER — Ambulatory Visit
Admission: RE | Admit: 2011-10-04 | Discharge: 2011-10-04 | Disposition: A | Discharge status: Home / Self Care | Payer: Medicare Other | Source: Ambulatory Visit | Attending: Internal Medicine | Admitting: Internal Medicine

## 2011-10-04 DIAGNOSIS — R222 Localized swelling, mass and lump, trunk: Secondary | ICD-10-CM

## 2011-10-04 MED ORDER — GADOBENATE DIMEGLUMINE 529 MG/ML IV SOLN
14.0000 mL | Freq: Once | INTRAVENOUS | Status: AC | PRN
  Administered 2011-10-04: 14 mL via INTRAVENOUS

## 2011-10-21 ENCOUNTER — Encounter: Payer: Self-pay | Admitting: Physical Medicine & Rehabilitation

## 2011-10-21 ENCOUNTER — Encounter: Payer: Medicare Other | Attending: Neurosurgery

## 2011-10-21 ENCOUNTER — Ambulatory Visit (HOSPITAL_BASED_OUTPATIENT_CLINIC_OR_DEPARTMENT_OTHER): Payer: Medicare Other | Admitting: Physical Medicine & Rehabilitation

## 2011-10-21 ENCOUNTER — Ambulatory Visit: Payer: Medicare Other

## 2011-10-21 DIAGNOSIS — M898X1 Other specified disorders of bone, shoulder: Secondary | ICD-10-CM | POA: Insufficient documentation

## 2011-10-21 DIAGNOSIS — IMO0001 Reserved for inherently not codable concepts without codable children: Secondary | ICD-10-CM

## 2011-10-21 DIAGNOSIS — M545 Low back pain, unspecified: Secondary | ICD-10-CM | POA: Insufficient documentation

## 2011-10-21 DIAGNOSIS — G894 Chronic pain syndrome: Secondary | ICD-10-CM

## 2011-10-21 DIAGNOSIS — IMO0002 Reserved for concepts with insufficient information to code with codable children: Secondary | ICD-10-CM | POA: Insufficient documentation

## 2011-10-21 DIAGNOSIS — M961 Postlaminectomy syndrome, not elsewhere classified: Secondary | ICD-10-CM | POA: Insufficient documentation

## 2011-10-21 DIAGNOSIS — M25819 Other specified joint disorders, unspecified shoulder: Secondary | ICD-10-CM

## 2011-10-21 DIAGNOSIS — G8928 Other chronic postprocedural pain: Secondary | ICD-10-CM | POA: Insufficient documentation

## 2011-10-21 MED ORDER — OXYCODONE-ACETAMINOPHEN 7.5-325 MG PO TABS: 1.0000 | ORAL_TABLET | ORAL | Status: DC | PRN

## 2011-10-21 NOTE — Progress Notes (Signed)
  Subjective:    Patient ID: Cindy Pratt, female    DOB: 11-09-1965, 46 y.o.   MRN: [id number] Bump on my neck.  Fell on R knee/hip in January. Interval hx MRI chest with sternoclavicular DJD  HPI Pain Inventory Average Pain 8 Pain Right Now 8 My pain is constant, sharp, burning, dull, stabbing, tingling and aching  In the last 24 hours, has pain interfered with the following? General activity 8 Relation with others 8 What TIME of day is your pain at its worst? all Sleep (in general) Poor  Pain is worse with: walking, bending, sitting and some activites Pain improves with: medication and positioning Relief from Meds: 3  Mobility walk without assistance Do you have any goals in this area?  no  Function Disabled 2003 Do you have any goals in this area?  no  Neuro/Psych weakness numbness tingling depression anxiety  Prior Studies CT/MRI   MRI of chest 2 weeks ago at [facility] for bone? Or lump on right chest. She was told it was degeneration and inflammation. She has several family members with lung and breast cancer so she is anxious. CT of lungs done week before Thanksgiving 2012 and she has a "4mm spot in left lung" and it will be repeated in May 2013.  Physicians involved in your care  Pulmonologist Dr. Meredeth Ide @ [facility], PCP was Dr Darl Pikes fuller but she has moved to [facility] and she is needing a new PCP. Any changes since last visit?  yes. She has lump on chest (see CT/MRI section)       Review of Systems  Constitutional: Positive for diaphoresis.  HENT: Negative.   Eyes: Negative.   Respiratory: Negative.   Cardiovascular: Negative.   Gastrointestinal: Negative.  Negative for abdominal distention.       Gastritis  Genitourinary: Negative.   Musculoskeletal: Positive for back pain.  Neurological: Negative.   Hematological: Bruises/bleeds easily.  Psychiatric/Behavioral: Positive for dysphoric mood.   Any changes since last visit?   no    Objective:   Physical Exam  Constitutional: She is oriented to person, place, and time.  Musculoskeletal: Normal range of motion.  Neurological: She is alert and oriented to person, place, and time. She displays abnormal reflex. A sensory deficit is present.  Reflex Scores:      Patellar reflexes are 2+ on the right side and 2+ on the left side.      Achilles reflexes are 2+ on the right side and 0 on the left side.      5/5 bilateral hip flexors and knee extensors 3 minus/5 in the left ankle dorsiflexor and 4/5 in the right ankle dorsiflexor   reduced right S1 sensory Lumbar range of motion 0-25% for flexion, extension, lateral rotation, and bending Tenderness to palpation bilateral greater trochanters negative over lumbar paraspinals        Assessment & Plan:  1. Lumbar postlaminectomy syndrome 2. Chronic left lower extremity radiculitis 3. Chronic pain syndrome 4. Fibromyalgia syndrome 5. Right sternal clavicular joint osteoarthritis

## 2011-10-21 NOTE — Patient Instructions (Signed)
Chronic Pain °Chronic pain can be defined as pain that is lasting, off and on, and lasts for 3 to 6 months or longer. Many things cause chronic pain, which can make it difficult to make a discrete diagnosis. There are many treatment options available for chronic pain. However, finding a treatment that works well for you may require trying various approaches until a suitable one is found. °CAUSES  °In some types of chronic medical conditions, the pain is caused by a normal pain response within the body. A normal pain response helps the body identify illness or injury and prevent further damage from being done. In these cases, the cause of the pain may be identified and treated, even if it may not be cured completely. Examples of chronic conditions which can cause chronic pain include: °· Inflammation of the joints (arthritis).  °· Back pain or neck pain (including bulging or herniated disks).  °· Migraine headaches.  °· Cancer.  °In some other types of chronic pain syndromes, the pain is caused by an abnormal pain response within the body. An abnormal pain response is present when there is no ongoing cause (or stimulus) for the pain, or when the cause of the pain is arising from the nerves or nervous system itself. Examples of conditions which can cause chronic pain due to an abnormal pain response include: °· Fibromyalgia.  °· Reflex sympathetic dystrophy (RSD).  °· Neuropathy (when the nerves themselves are damaged, and may cause pain).  °DIAGNOSIS  °Your caregiver will help diagnose your condition over time. In many cases, the initial focus will be on excluding conditions that could be causing the pain. Depending on your symptoms, your caregiver may order some tests to diagnose your condition. Some of these tests include: °· Blood tests.  °· Computerized X-ray scans (CT scan).  °· Computerized magnetic scans (MRI).  °· X-rays.  °· Ultrasounds.  °· Nerve conduction studies.  °· Consultation with other physicians or  specialists.  °TREATMENT  °There are many treatment options for people suffering from chronic pain. Finding a treatment that works well may take time.  °· You may be referred to a pain management specialist.  °· You may be put on medication to help with the pain. Unfortunately, some medications (such as opiate medications) may not be very effective in cases where chronic pain is due to abnormal pain responses. Finding the right medications can take some time.  °· Adjunctive therapies may be used to provide additional relief and improve a patient's quality of life. These therapies include:  °· Mindfulness meditation.  °· Acupuncture.  °· Biofeedback.  °· Cognitive-behavioral therapy.  °· In certain cases, surgical interventions may be attempted.  °HOME CARE INSTRUCTIONS  °· Make sure you understand these instructions prior to discharge.  °· Ask any questions and share any further concerns you have with your caregiver prior to discharge.  °· Take all medications as directed by your caregiver.  °· Keep all follow-up appointments.  °SEEK MEDICAL CARE IF:  °· Your pain gets worse.  °· You develop a new pain that was not present before.  °· You cannot tolerate any medications prescribed by your caregiver.  °· You develop new symptoms since your last visit with your caregiver.  °SEEK IMMEDIATE MEDICAL CARE IF:  °· You develop muscular weakness.  °· You have decreased sensation or numbness.  °· You lose control of bowel or bladder function.  °· Your pain suddenly gets much worse.  °· You have an oral   temperature above 102° F (38.9° C), not controlled by medication.  °· You develop shaking chills, confusion, chest pain, or shortness of breath.  °Document Released: 05/08/2002 Document Revised: 04/28/2011 Document Reviewed: 08/14/2008 °[employer]® Patient Information ©2012 [employer]. °

## 2011-11-18 ENCOUNTER — Ambulatory Visit (HOSPITAL_BASED_OUTPATIENT_CLINIC_OR_DEPARTMENT_OTHER): Payer: Medicare Other | Admitting: Physical Medicine & Rehabilitation

## 2011-11-18 ENCOUNTER — Encounter: Payer: Medicare Other | Attending: Neurosurgery

## 2011-11-18 ENCOUNTER — Encounter: Payer: Self-pay | Admitting: Physical Medicine & Rehabilitation

## 2011-11-18 VITALS — BP 129/69 | HR 71 | Resp 18 | Ht 63.0 in | Wt 146.0 lb

## 2011-11-18 DIAGNOSIS — G894 Chronic pain syndrome: Secondary | ICD-10-CM

## 2011-11-18 DIAGNOSIS — M545 Low back pain, unspecified: Secondary | ICD-10-CM | POA: Insufficient documentation

## 2011-11-18 DIAGNOSIS — G8928 Other chronic postprocedural pain: Secondary | ICD-10-CM | POA: Insufficient documentation

## 2011-11-18 DIAGNOSIS — IMO0001 Reserved for inherently not codable concepts without codable children: Secondary | ICD-10-CM

## 2011-11-18 DIAGNOSIS — M961 Postlaminectomy syndrome, not elsewhere classified: Secondary | ICD-10-CM | POA: Insufficient documentation

## 2011-11-18 DIAGNOSIS — IMO0002 Reserved for concepts with insufficient information to code with codable children: Secondary | ICD-10-CM

## 2011-11-18 MED ORDER — OXYCODONE-ACETAMINOPHEN 7.5-325 MG PO TABS: 1.0000 | ORAL_TABLET | ORAL | Status: DC | PRN

## 2011-11-18 MED ORDER — TIZANIDINE HCL 4 MG PO TABS: 4.0000 mg | ORAL_TABLET | Freq: Two times a day (BID) | ORAL | Status: DC

## 2011-11-18 NOTE — Progress Notes (Signed)
  Subjective:    Patient ID: Cindy Pratt, female    DOB: November 02, 1965, 46 y.o.   MRN: [id number]  HPI  5 lb weight loss using herbal supplement; helping her FMS symptoms also A little bit more active with activities., No consistent walking schedule the Pain Inventory Average Pain 8 Pain Right Now 8 My pain is constant, sharp, burning, dull, stabbing, tingling and aching  In the last 24 hours, has pain interfered with the following? General activity 8 Relation with others 8 Enjoyment of life 8 What TIME of day is your pain at its worst? all through the day & night Sleep (in general) Poor  Pain is worse with: walking and standing Pain improves with: rest and medication Relief from Meds: 7  Mobility walk without assistance how many minutes can you walk? 20 ability to climb steps?  yes do you drive?  yes Do you have any goals in this area?  no  Function disabled: date disabled 2003  Neuro/Psych numbness tingling depression anxiety  Prior Studies Any changes since last visit?  no  Physicians involved in your care Any changes since last visit?  no       Review of Systems  Constitutional: Negative.   HENT: Negative.   Eyes: Negative.   Respiratory: Negative.   Cardiovascular: Negative.   Gastrointestinal: Positive for constipation.  Genitourinary: Positive for dysuria and frequency (urethral spasms).  Musculoskeletal: Positive for myalgias, back pain and joint swelling (hands & feet occ; r/t OA).  Skin: Negative.   Neurological: Positive for numbness.  Hematological: Negative.   Psychiatric/Behavioral: Negative.        Objective:   Physical Exam  Constitutional: She is oriented to person, place, and time. She appears well-developed and well-nourished.  Musculoskeletal:       Cervical back: Normal.       Lumbar back: She exhibits decreased range of motion.  Neurological: She is alert and oriented to person, place, and time. She has normal strength. No  sensory deficit. Gait normal.  Reflex Scores:      Tricep reflexes are 2+ on the right side and 2+ on the left side.      Bicep reflexes are 2+ on the right side and 2+ on the left side.      Brachioradialis reflexes are 2+ on the right side and 2+ on the left side.      Patellar reflexes are 2+ on the right side and 2+ on the left side.      Achilles reflexes are 2+ on the right side and 2+ on the left side.      0/18 fibromyalgia tender points  Psychiatric: She has a normal mood and affect. Her behavior is normal. Judgment and thought content normal.          Assessment & Plan:  1. Lumbar postlaminectomy syndrome with chronic radiculitis.  2. Fibromyalgia syndrome trigger points are negative today as a new supplement. I will start prescribing her has ended being 4 mg twice a day  3. Neck pain chronic but flared up currently. Will give exercises history of remote trauma no signs of cervical radiculopathy

## 2011-11-18 NOTE — Patient Instructions (Signed)
Cervical Strain Care After A cervical strain is when the muscles and ligaments in your neck have been stretched. The bones are not broken. If you had any problems moving your arms or legs immediately after the injury, even if the problem has gone away, make sure to tell this to your caregiver.  HOME CARE INSTRUCTIONS   While awake, apply ice packs to the neck or areas of pain about every 1 to 2 hours, for 15 to 20 minutes at a time. Do this for 2 days. If you were given a cervical collar for support, ask your caregiver if you may remove it for bathing or applying ice.   If given a cervical collar, wear as instructed. Do not remove any collar unless instructed by a caregiver.   Only take over-the-counter or prescription medicines for pain, discomfort, or fever as directed by your caregiver.  Recheck with the hospital or clinic after a radiologist has read your X-rays. Recheck with the hospital or clinic to make sure the initial readings are correct. Do this also to determine if you need further studies. It is your responsibility to find out your X-ray results. X-rays are sometimes repeated in one week to ten days. These are often repeated to make sure that a hairline fracture was not overlooked. Ask your caregiver how you are to find out about your radiology (X-ray) results. SEEK IMMEDIATE MEDICAL CARE IF:   You have increasing pain in your neck.   You develop difficulties swallowing or breathing.   You have numbness, weakness, or movement problems in the arms or legs.   You have difficulty walking.   You develop bowel or bladder retention or incontinence.   You have problems with walking.  MAKE SURE YOU:   Understand these instructions.   Will watch your condition.   Will get help right away if you are not doing well or get worse.  Document Released: 08/16/2005 Document Revised: 04/28/2011 Document Reviewed: 03/29/2008 [employer] Patient Information 2012 [employer]. 

## 2011-12-20 ENCOUNTER — Encounter: Payer: Self-pay | Admitting: Physical Medicine & Rehabilitation

## 2011-12-20 ENCOUNTER — Ambulatory Visit (HOSPITAL_BASED_OUTPATIENT_CLINIC_OR_DEPARTMENT_OTHER): Payer: Medicare Other | Admitting: Physical Medicine & Rehabilitation

## 2011-12-20 ENCOUNTER — Encounter: Payer: Medicare Other | Attending: Neurosurgery

## 2011-12-20 VITALS — BP 121/60 | HR 59 | Resp 16 | Ht 63.0 in | Wt 146.2 lb

## 2011-12-20 DIAGNOSIS — M545 Low back pain, unspecified: Secondary | ICD-10-CM | POA: Insufficient documentation

## 2011-12-20 DIAGNOSIS — G8928 Other chronic postprocedural pain: Secondary | ICD-10-CM | POA: Insufficient documentation

## 2011-12-20 DIAGNOSIS — M961 Postlaminectomy syndrome, not elsewhere classified: Secondary | ICD-10-CM

## 2011-12-20 DIAGNOSIS — IMO0001 Reserved for inherently not codable concepts without codable children: Secondary | ICD-10-CM

## 2011-12-20 MED ORDER — MILNACIPRAN HCL 12.5 MG PO TABS
1.0000 | ORAL_TABLET | Freq: Two times a day (BID) | ORAL | Status: DC
Start: 2011-12-20 — End: 2012-01-17

## 2011-12-20 MED ORDER — OXYCODONE-ACETAMINOPHEN 7.5-325 MG PO TABS: 1.0000 | ORAL_TABLET | ORAL | Status: DC | PRN

## 2011-12-20 NOTE — Progress Notes (Signed)
  Subjective:    Patient ID: Cindy Pratt, female    DOB: 02-17-1966, 46 y.o.   MRN: [id number]  HPI   no further weight loss. Increased in fibromyalgia syndrome. Pain all over the body including the oral area. Also pain around the upper waist and rib area when she wakes up. No recent trauma. Pain Inventory Average Pain 8 last month now 9 Pain Right Now 8 last month now 9 My pain is constant, sharp, burning, dull, stabbing, tingling and aching  In the last 24 hours, has pain interfered with the following? General activity 8 last month now 9 Relation with others 8 last month now 9 Enjoyment of life 8 last month What TIME of day is your pain at its worst? all through the day & night Sleep (in general) Poor  Pain is worse with: walking and standing Pain improves with: rest and medication Relief from Meds: 7  Mobility walk without assistance how many minutes can you walk? 20 ability to climb steps?  yes do you drive?  yes Do you have any goals in this area?  no  Function disabled: date disabled 2003  Neuro/Psych numbness tingling depression anxiety  Prior Studies Any changes since last visit?  no  Physicians involved in your care Any changes since last visit?  no       Review of Systems  Constitutional: Negative.   HENT: Negative.   Eyes: Negative.   Respiratory: Negative.   Cardiovascular: Negative.   Gastrointestinal: Positive for constipation.  Genitourinary: Positive for dysuria and frequency (urethral spasms).  Musculoskeletal: Positive for myalgias, back pain and joint swelling (hands & feet occ; r/t OA).  Skin: Negative.   Neurological: Positive for numbness.  Hematological: Negative.   Psychiatric/Behavioral: Negative.        Objective:   Physical Exam  Constitutional: She is oriented to person, place, and time. She appears well-developed and well-nourished.  Musculoskeletal:       Cervical back: Normal.       Lumbar back: She exhibits  decreased range of motion.  Neurological: She is alert and oriented to person, place, and time. She has normal strength. No sensory deficit. Gait normal.  Reflex Scores:      Tricep reflexes are 2+ on the right side and 2+ on the left side.      Bicep reflexes are 2+ on the right side and 2+ on the left side.      Brachioradialis reflexes are 2+ on the right side and 2+ on the left side.      Patellar reflexes are 2+ on the right side and 2+ on the left side.      Achilles reflexes are 2+ on the right side and 2+ on the left side.      18/18 fibromyalgia tender points  Psychiatric: She has a normal mood and affect. Her behavior is normal. Judgment and thought content normal.   Lungs are clear to auscultation       Assessment & Plan:  1. Lumbar postlaminectomy syndrome with chronic radiculitis.  2. Fibromyalgia syndrome exacerbation. Will trial on savella.. I will start prescribing her has ended being 4 mg twice a day  3.  neck pain flareup resolved

## 2011-12-20 NOTE — Progress Notes (Signed)
  Subjective:    Patient ID: Cindy Pratt, female    DOB: [birth year], 46 y.o.   MRN: [id number]  HPI  Pain Inventory Average Pain 9 Pain Right Now 9 My pain is constant, sharp, burning, dull, stabbing, tingling and aching  In the last 24 hours, has pain interfered with the following? General activity 10 Relation with others 10 Enjoyment of life 10 What TIME of day is your pain at its worst? all of the time Sleep (in general) Poor  Pain is worse with: everything Pain improves with: medication Relief from Meds: 5  Mobility walk without assistance how many minutes can you walk? 3-5 ability to climb steps?  yes do you drive?  yes Do you have any goals in this area?  no  Function disabled: date disabled 2003 I need assistance with the following:  household duties and shopping Do you have any goals in this area?  no  Neuro/Psych weakness numbness tingling depression anxiety  Prior Studies Any changes since last visit?  no  Physicians involved in your care Any changes since last visit?  no      Review of Systems  Constitutional:       Low sugar   Gastrointestinal: Positive for nausea.  Musculoskeletal: Positive for myalgias and arthralgias.  Neurological: Positive for weakness and numbness.  Psychiatric/Behavioral: Positive for dysphoric mood. The patient is nervous/anxious.   All other systems reviewed and are negative.       Objective:   Physical Exam        Assessment & Plan:

## 2011-12-20 NOTE — Patient Instructions (Addendum)
Savella once a day today and then start twice a day tomorrow. We will discuss dosing next month. We may have to go to 25 mg if you are tolerating the medicine well but do not feel much relief.

## 2012-01-17 ENCOUNTER — Encounter: Payer: Medicare Other | Attending: Neurosurgery

## 2012-01-17 ENCOUNTER — Ambulatory Visit (HOSPITAL_BASED_OUTPATIENT_CLINIC_OR_DEPARTMENT_OTHER): Payer: Medicare Other | Admitting: Physical Medicine & Rehabilitation

## 2012-01-17 ENCOUNTER — Encounter: Payer: Self-pay | Admitting: Physical Medicine & Rehabilitation

## 2012-01-17 VITALS — BP 122/65 | HR 95 | Resp 16 | Ht 63.5 in | Wt 146.0 lb

## 2012-01-17 DIAGNOSIS — IMO0001 Reserved for inherently not codable concepts without codable children: Secondary | ICD-10-CM

## 2012-01-17 DIAGNOSIS — M961 Postlaminectomy syndrome, not elsewhere classified: Secondary | ICD-10-CM

## 2012-01-17 DIAGNOSIS — G8928 Other chronic postprocedural pain: Secondary | ICD-10-CM | POA: Insufficient documentation

## 2012-01-17 DIAGNOSIS — M545 Low back pain, unspecified: Secondary | ICD-10-CM | POA: Insufficient documentation

## 2012-01-17 MED ORDER — MORPHINE SULFATE ER 30 MG PO TBCR: 30.0000 mg | EXTENDED_RELEASE_TABLET | Freq: Two times a day (BID) | ORAL | Status: DC

## 2012-01-17 MED ORDER — MILNACIPRAN HCL 12.5 MG PO TABS: 1.0000 | ORAL_TABLET | Freq: Two times a day (BID) | ORAL | Status: DC

## 2012-01-17 NOTE — Patient Instructions (Signed)
He will start on long-acting morphine twice a day. Call us in a  week if it's not feeling like it's helping as much as the oxycodone. Continue savella

## 2012-01-17 NOTE — Progress Notes (Signed)
  Subjective:    Patient ID: Cindy Pratt, female    DOB: 1966/03/13, 46 y.o.   MRN: [id number]  HPI Fibromyalgia pain is improved on SAVELLA. No sweating. Patient taking 5 tablets per day of oxycodone with short duration of relief. Back pain and left lower extremity pain are the main issues now  Pain Inventory Average Pain 9 Pain Right Now 9 My pain is constant, sharp, burning, dull, stabbing, tingling and aching  In the last 24 hours, has pain interfered with the following? General activity 9 Relation with others 9 Enjoyment of life 9 What TIME of day is your pain at its worst? all of the time Sleep (in general) Poor  Pain is worse with: all Pain improves with: medication Relief from Meds: 6  Mobility Do you have any goals in this area?  no  Function Do you have any goals in this area?  no  Neuro/Psych weakness numbness tingling depression anxiety  Prior Studies Any changes since last visit?  no  Physicians involved in your care Any changes since last visit?  no     Review of Systems  Musculoskeletal: Positive for back pain.       Shoulder pain & hand pain bilaterally  Neurological: Positive for weakness and numbness.  Psychiatric/Behavioral: Positive for dysphoric mood. The patient is nervous/anxious.   All other systems reviewed and are negative.       Objective:   Physical Exam  4/18 fibromyalgia points positive, trapezius and lateral hip are most tender Back is reduced range of motion with forward flexion, extension, lateral rotation, and bending. Motor strength is 5/5 with exception of left ankle dorsiflexor which is 3/5 Ambulation without evidence of toe drag or knee instability Straight leg raising test is negative     Assessment & Plan:  1. Lumbar postlaminectomy syndrome status post L5-S1 fusion. Last MRI approximately one year ago showing no significant adjacent level disease.her chronic pain is generally relieved with narcotics although  the duration of effect is short. She has never tried a long acting medication will trial her on morphine sulfate 30 mg twice a day extended release. She is to call  in one week if it's not helping her at this dose may have to go to 3 times a day 2. fibromyalgia syndrome improved on current medications-Savella PA visit one month

## 2012-01-18 ENCOUNTER — Ambulatory Visit: Payer: Self-pay | Admitting: Specialist

## 2012-01-25 ENCOUNTER — Telehealth: Payer: Self-pay | Admitting: Physical Medicine & Rehabilitation

## 2012-01-25 NOTE — Telephone Encounter (Signed)
Documented change in therapy on her medication list.  LM with pt letting her know that this has been changed and to call us when she had a few days left of her medication.

## 2012-01-25 NOTE — Telephone Encounter (Signed)
FYI to Dr.  Patient had to start 3rd pain pill.  Will run out early

## 2012-01-31 ENCOUNTER — Telehealth: Payer: Self-pay | Admitting: *Deleted

## 2012-01-31 MED ORDER — MORPHINE SULFATE ER 30 MG PO TBCR: 30.0000 mg | EXTENDED_RELEASE_TABLET | Freq: Three times a day (TID) | ORAL | Status: DC

## 2012-01-31 NOTE — Telephone Encounter (Signed)
Refill on Morphine °

## 2012-01-31 NOTE — Telephone Encounter (Signed)
Rx ready for pick up, pt aware 

## 2012-02-16 ENCOUNTER — Encounter: Payer: Medicare Other | Admitting: Physical Medicine and Rehabilitation

## 2012-03-01 ENCOUNTER — Encounter: Payer: Self-pay | Admitting: Physical Medicine and Rehabilitation

## 2012-03-01 ENCOUNTER — Encounter
Payer: PRIVATE HEALTH INSURANCE | Attending: Physical Medicine & Rehabilitation | Admitting: Physical Medicine and Rehabilitation

## 2012-03-01 VITALS — BP 124/70 | HR 71 | Resp 16 | Ht 63.5 in | Wt 142.0 lb

## 2012-03-01 DIAGNOSIS — M961 Postlaminectomy syndrome, not elsewhere classified: Secondary | ICD-10-CM | POA: Insufficient documentation

## 2012-03-01 DIAGNOSIS — M545 Low back pain, unspecified: Secondary | ICD-10-CM | POA: Insufficient documentation

## 2012-03-01 DIAGNOSIS — E785 Hyperlipidemia, unspecified: Secondary | ICD-10-CM | POA: Insufficient documentation

## 2012-03-01 DIAGNOSIS — IMO0001 Reserved for inherently not codable concepts without codable children: Secondary | ICD-10-CM | POA: Insufficient documentation

## 2012-03-01 DIAGNOSIS — M797 Fibromyalgia: Secondary | ICD-10-CM

## 2012-03-01 DIAGNOSIS — M542 Cervicalgia: Secondary | ICD-10-CM | POA: Insufficient documentation

## 2012-03-01 DIAGNOSIS — Z981 Arthrodesis status: Secondary | ICD-10-CM | POA: Insufficient documentation

## 2012-03-01 DIAGNOSIS — R209 Unspecified disturbances of skin sensation: Secondary | ICD-10-CM | POA: Insufficient documentation

## 2012-03-01 MED ORDER — MORPHINE SULFATE ER 30 MG PO TBCR: 30.0000 mg | EXTENDED_RELEASE_TABLET | Freq: Three times a day (TID) | ORAL | Status: DC

## 2012-03-01 NOTE — Progress Notes (Signed)
Subjective:    Patient ID: Cindy Pratt, female    DOB: 1966-06-30, 46 y.o.   MRN: [id number]  HPI The patient complains about chronic low back pain .  The patient also complains about neck pain, and numbness and tingling in her whole arm, if she sleeps on it, it can be either side. This resolves when she changes position. The problem has been stable improved gotten worse. Pain Inventory Average Pain 7 Pain Right Now 7 My pain is intermittent  In the last 24 hours, has pain interfered with the following? General activity 7 Relation with others 7 Enjoyment of life 7 What TIME of day is your pain at its worst? All Day Sleep (in general) Poor  Pain is worse with: sitting and standing Pain improves with: medication Relief from Meds: 6  Mobility walk without assistance Do you have any goals in this area?  no  Function Do you have any goals in this area?  no  Neuro/Psych numbness tingling depression anxiety  Prior Studies Any changes since last visit?  no  Physicians involved in your care Any changes since last visit?  no   Family History  Problem Relation Age of Onset  . Hypertension Mother   . Hypertension Father   . Breast cancer Maternal Grandmother   . Liver cancer Maternal Grandmother   . Cancer Maternal Grandmother     breast cancer  . Ovarian cancer Cousin   . Cancer Cousin     1st c. maternal side/ cervical cancer  . Diabetes Maternal Grandfather     both sides  . Cancer Cousin     1st c maternal/ colon cancer   History   Social History  . Marital Status: Married    Spouse Name: N/A    Number of Children: 4  . Years of Education: N/A   Occupational History  . disabled    Social History Main Topics  . Smoking status: Former Smoker    Types: Cigarettes    Quit date: 07/31/2007  . Smokeless tobacco: Never Used  . Alcohol Use: No  . Drug Use: No  . Sexually Active: Yes    Birth Control/ Protection: Surgical   Other Topics Concern  .  None   Social History Narrative  . None   Past Surgical History  Procedure Date  . Back surgery 1997, 2003  . Abdominal hysterectomy   . Cystectomy   . Diagnostic laparoscopy    Past Medical History  Diagnosis Date  . Fibromyalgia   . Anxiety   . Depression   . Hyperlipidemia   . Asthma   . Degenerative joint disease   . Osteoarthritis   . Belchings   . Lumbar post-laminectomy syndrome   . Lumbosacral neuritis   . Disturbance of skin sensation   . Fibromyalgia   . Sciatica   . Chronic pain syndrome   . Other chronic postoperative pain    BP 124/70  Pulse 71  Resp 16  Ht 5' 3.5" (1.613 m)  Wt 142 lb (64.411 kg)  BMI 24.76 kg/m2  SpO2 99%      Review of Systems  Constitutional: Negative.   HENT: Positive for neck pain and neck stiffness.   Eyes: Negative.   Respiratory: Negative.   Cardiovascular: Negative.   Gastrointestinal: Positive for nausea.  Genitourinary: Negative.   Musculoskeletal: Positive for back pain.  Skin: Negative.   Neurological: Positive for numbness.       Tingling  Hematological: Negative.  Psychiatric/Behavioral:       Depression/Anxiety       Objective:   Physical Exam  Constitutional: She is oriented to person, place, and time. She appears well-developed and well-nourished.  HENT:  Head: Normocephalic.  Neck: Neck supple.  Musculoskeletal: She exhibits tenderness.  Neurological: She is alert and oriented to person, place, and time.  Skin: Skin is warm and dry.  Psychiatric: She has a normal mood and affect.    Symmetric normal motor tone is noted throughout. Normal muscle bulk. Muscle testing reveals 5/5 muscle strength of the upper extremity, and 5/5 of the lower extremity, except give away weakness on left iliopsoas 4-?5, and weakness in tibialis anterior 4-/5. Full range of motion in upper and lower extremities. ROM of spine is  restricted. Fine motor movements are normal in both hands.  DTR in the upper and lower  extremity are present and symmetric 2+. No clonus is noted.  Patient arises from chair without difficulty. Narrow based gait with normal arm swing bilateral , able to stand on heels and toes . Tandem walk is stable. No pronator drift. Rhomberg negative. Head protruded, hyper extension of C-spine.       Assessment & Plan:  1. Lumbar postlaminectomy syndrome status post L5-S1 fusion. Last MRI approximately one year ago showing no significant adjacent level disease.her chronic pain is generally relieved with narcotics although the duration of effect is short. She reports that being on morphine sulfate 30 mg three times a day, has improved her pain controll.  2. fibromyalgia syndrome improved on current medications-Savella  3. neck pain, explain to patient in length about how her posture affects the neck pain, also showed her some tricks to improve her posture.  Ordered x-ray of her neck. Might consider physical therapy at next visit. Advised patient to walk in the pool to improve her overall muscle strength, without putting too much stress on her joints. PA visit one month

## 2012-03-01 NOTE — Patient Instructions (Signed)
Continue with walking program, stay active with your animals.

## 2012-03-03 ENCOUNTER — Ambulatory Visit: Payer: Medicare Other | Admitting: Gastroenterology

## 2012-03-08 ENCOUNTER — Ambulatory Visit
Admission: RE | Admit: 2012-03-08 | Discharge: 2012-03-08 | Disposition: A | Discharge status: Home / Self Care | Payer: Medicare Other | Source: Ambulatory Visit | Attending: Physical Medicine and Rehabilitation | Admitting: Physical Medicine and Rehabilitation

## 2012-03-08 DIAGNOSIS — M542 Cervicalgia: Secondary | ICD-10-CM

## 2012-03-15 ENCOUNTER — Telehealth: Payer: Self-pay | Admitting: Physical Medicine & Rehabilitation

## 2012-03-15 NOTE — Telephone Encounter (Signed)
I ordered x-rays, not an MRI,

## 2012-03-15 NOTE — Telephone Encounter (Signed)
Patient had xray and was told she needed to have MRI in order to see.  Please order MRI so she can have it done before her next appointment.

## 2012-03-15 NOTE — Telephone Encounter (Signed)
When pt was at [facility], the radiology tech told her that a lot of times an xray will not show what is wrong and she will probably need a MRI. She is requesting that you look over her xray results to determine if she will need a MRI done. Thanks.

## 2012-03-15 NOTE — Telephone Encounter (Signed)
From looking the chart I see that you ordered the xrays but I don't see where you said that she needed a MRI. Please advise.

## 2012-03-16 ENCOUNTER — Telehealth: Payer: Self-pay | Admitting: Gastroenterology

## 2012-03-16 NOTE — Telephone Encounter (Signed)
Message copied by Arna Snipe on Thu Mar 16, 2012 10:58 PM ------      Message from: Donata Duff      Created: Fri Mar 03, 2012  4:01 PM       Do not bill

## 2012-03-16 NOTE — Telephone Encounter (Signed)
Pt was told that this would be discussed at her next visit per Clydie Braun.

## 2012-03-16 NOTE — Telephone Encounter (Signed)
X-ray shows mild degenerative changes as I expected, if patient is very anxious and wants to have a MRI we can order it for this reason, otherwise I would try PT with modalities in her case.

## 2012-03-16 NOTE — Telephone Encounter (Signed)
I will look over the x-ray, her symtoms suggest spondylosis not disc herniation or foraminal stenosis, also most insurances will not pay for a MRI without having a X-ray before

## 2012-03-31 ENCOUNTER — Encounter: Payer: Medicare Other | Admitting: Physical Medicine and Rehabilitation

## 2012-04-04 ENCOUNTER — Encounter
Payer: PRIVATE HEALTH INSURANCE | Attending: Physical Medicine and Rehabilitation | Admitting: Physical Medicine and Rehabilitation

## 2012-04-04 ENCOUNTER — Other Ambulatory Visit: Payer: Self-pay | Admitting: Physical Medicine and Rehabilitation

## 2012-04-04 ENCOUNTER — Encounter: Payer: Self-pay | Admitting: Physical Medicine and Rehabilitation

## 2012-04-04 VITALS — BP 110/55 | HR 74 | Resp 14 | Ht 63.0 in | Wt 139.0 lb

## 2012-04-04 DIAGNOSIS — M545 Low back pain, unspecified: Secondary | ICD-10-CM | POA: Insufficient documentation

## 2012-04-04 DIAGNOSIS — M549 Dorsalgia, unspecified: Secondary | ICD-10-CM

## 2012-04-04 DIAGNOSIS — R209 Unspecified disturbances of skin sensation: Secondary | ICD-10-CM | POA: Insufficient documentation

## 2012-04-04 DIAGNOSIS — Z79899 Other long term (current) drug therapy: Secondary | ICD-10-CM | POA: Insufficient documentation

## 2012-04-04 DIAGNOSIS — M542 Cervicalgia: Secondary | ICD-10-CM

## 2012-04-04 DIAGNOSIS — M797 Fibromyalgia: Secondary | ICD-10-CM

## 2012-04-04 DIAGNOSIS — IMO0001 Reserved for inherently not codable concepts without codable children: Secondary | ICD-10-CM | POA: Insufficient documentation

## 2012-04-04 DIAGNOSIS — Z981 Arthrodesis status: Secondary | ICD-10-CM | POA: Insufficient documentation

## 2012-04-04 DIAGNOSIS — M961 Postlaminectomy syndrome, not elsewhere classified: Secondary | ICD-10-CM | POA: Insufficient documentation

## 2012-04-04 MED ORDER — MORPHINE SULFATE ER 30 MG PO TBCR: 30.0000 mg | EXTENDED_RELEASE_TABLET | Freq: Three times a day (TID) | ORAL | Status: DC

## 2012-04-04 NOTE — Progress Notes (Signed)
Subjective:    Patient ID: Cindy Pratt, female    DOB: 30-Jan-1966, 46 y.o.   MRN: [id number]  HPI The patient complains about chronic low back pain . The patient also complains about neck pain, and numbness and tingling in her whole arm, if she sleeps on it, it can be either side. This resolves when she changes position.  The problem has been stable.   Pain Inventory Average Pain 9 Pain Right Now 9 My pain is constant, sharp, burning, dull, stabbing, tingling and aching  In the last 24 hours, has pain interfered with the following? General activity 10 Relation with others 10 Enjoyment of life 10 What TIME of day is your pain at its worst? all the time Sleep (in general) Poor  Pain is worse with: walking, bending, sitting, inactivity, standing and some activites Pain improves with: rest and medication Relief from Meds: 2  Mobility Do you have any goals in this area?  no  Function Do you have any goals in this area?  no  Neuro/Psych numbness tingling depression anxiety  Prior Studies Any changes since last visit?  no  Physicians involved in your care Any changes since last visit?  no   Family History  Problem Relation Age of Onset  . Hypertension Mother   . Hypertension Father   . Breast cancer Maternal Grandmother   . Liver cancer Maternal Grandmother   . Cancer Maternal Grandmother     breast cancer  . Ovarian cancer Cousin   . Cancer Cousin     1st c. maternal side/ cervical cancer  . Diabetes Maternal Grandfather     both sides  . Cancer Cousin     1st c maternal/ colon cancer   History   Social History  . Marital Status: Married    Spouse Name: N/A    Number of Children: 4  . Years of Education: N/A   Occupational History  . disabled    Social History Main Topics  . Smoking status: Former Smoker    Types: Cigarettes    Quit date: 07/31/2007  . Smokeless tobacco: Never Used  . Alcohol Use: No  . Drug Use: No  . Sexually Active: Yes   Birth Control/ Protection: Surgical   Other Topics Concern  . None   Social History Narrative  . None   Past Surgical History  Procedure Date  . Back surgery 1997, 2003  . Abdominal hysterectomy   . Cystectomy   . Diagnostic laparoscopy    Past Medical History  Diagnosis Date  . Fibromyalgia   . Anxiety   . Depression   . Hyperlipidemia   . Asthma   . Degenerative joint disease   . Osteoarthritis   . Belchings   . Lumbar post-laminectomy syndrome   . Lumbosacral neuritis   . Disturbance of skin sensation   . Fibromyalgia   . Sciatica   . Chronic pain syndrome   . Other chronic postoperative pain    BP 110/55  Pulse 74  Resp 14  Ht 5\' 3"  (1.6 m)  Wt 139 lb (63.05 kg)  BMI 24.62 kg/m2  SpO2 99%     Review of Systems  HENT: Positive for neck pain.   Gastrointestinal: Positive for abdominal pain and constipation.  Musculoskeletal: Positive for back pain, joint swelling and arthralgias.  Psychiatric/Behavioral: Positive for dysphoric mood. The patient is nervous/anxious.   All other systems reviewed and are negative.       Objective:   Physical  Exam Constitutional: She is oriented to person, place, and time. She appears well-developed and well-nourished.  HENT:  Head: Normocephalic.  Neck: Neck supple.  Musculoskeletal: She exhibits tenderness.  Neurological: She is alert and oriented to person, place, and time.  Skin: Skin is warm and dry.  Psychiatric: She has a normal mood and affect.   Symmetric normal motor tone is noted throughout. Normal muscle bulk. Muscle testing reveals 5/5 muscle strength of the upper extremity, and 5/5 of the lower extremity, except give away weakness on left iliopsoas 4-?5, and weakness in tibialis anterior 4-/5. Full range of motion in upper and lower extremities. ROM of spine is restricted. Fine motor movements are normal in both hands.  DTR in the upper and lower extremity are present and symmetric 2+. No clonus is noted.    Patient arises from chair without difficulty. Narrow based gait with normal arm swing bilateral , able to stand on heels and toes . Tandem walk is stable. No pronator drift. Rhomberg negative.  Head protruded, hyper extension of C-spine.         Assessment & Plan:  1. Lumbar postlaminectomy syndrome status post L5-S1 fusion. Last MRI approximately one year ago showing no significant adjacent level disease.her chronic pain is generally relieved with narcotics although the duration of effect is short. She reports that being on morphine sulfate 30 mg three times a day, has improved her pain controll.  2. fibromyalgia syndrome improved on current medications-Savella  3. neck pain,  x-ray of her neck showed mild degenerative changes.Ordered physical therapy, with modalities for pain relief. Advised patient to walk in the pool to improve her overall muscle strength, without putting too much stress on her joints.  PA visit one month

## 2012-04-04 NOTE — Patient Instructions (Signed)
Continue with walking program...

## 2012-04-05 LAB — DRUGS OF ABUSE SCREEN W/O ALC, ROUTINE URINE
Amphetamine Screen, Ur: NEGATIVE
Benzodiazepines.: NEGATIVE
Marijuana Metabolite: NEGATIVE
Methadone: NEGATIVE
Phencyclidine (PCP): NEGATIVE
Propoxyphene: NEGATIVE

## 2012-04-07 LAB — OPIATES/OPIOIDS (LC/MS-MS)
Codeine Urine: NEGATIVE ng/mL
Hydrocodone: NEGATIVE ng/mL
Hydromorphone: NEGATIVE ng/mL

## 2012-04-09 ENCOUNTER — Emergency Department (INDEPENDENT_AMBULATORY_CARE_PROVIDER_SITE_OTHER)
Admission: EM | Admit: 2012-04-09 | Discharge: 2012-04-09 | Disposition: A | Discharge status: Home / Self Care | Payer: PRIVATE HEALTH INSURANCE | Source: Home / Self Care | Attending: Emergency Medicine | Admitting: Emergency Medicine

## 2012-04-09 ENCOUNTER — Encounter (HOSPITAL_COMMUNITY): Payer: Self-pay | Admitting: Emergency Medicine

## 2012-04-09 DIAGNOSIS — T148XXA Other injury of unspecified body region, initial encounter: Secondary | ICD-10-CM

## 2012-04-09 DIAGNOSIS — W57XXXA Bitten or stung by nonvenomous insect and other nonvenomous arthropods, initial encounter: Secondary | ICD-10-CM

## 2012-04-09 DIAGNOSIS — IMO0001 Reserved for inherently not codable concepts without codable children: Secondary | ICD-10-CM

## 2012-04-09 MED ORDER — CEPHALEXIN 500 MG PO CAPS: 500.0000 mg | ORAL_CAPSULE | Freq: Four times a day (QID) | ORAL | Status: AC

## 2012-04-09 MED ORDER — FAMOTIDINE 20 MG PO TABS: 20.0000 mg | ORAL_TABLET | Freq: Two times a day (BID) | ORAL | Status: DC

## 2012-04-09 MED ORDER — MUPIROCIN 2 % EX OINT: TOPICAL_OINTMENT | Freq: Three times a day (TID) | CUTANEOUS | Status: AC

## 2012-04-09 NOTE — ED Provider Notes (Signed)
History     CSN: [id number]  Arrival date & time 04/09/12  1452   First MD Initiated Contact with Patient 04/09/12 1513      Chief Complaint  Patient presents with  . Foot Pain    (Consider location/radiation/quality/duration/timing/severity/associated sxs/prior treatment) HPI Comments: Patient states that she was stung on the medial aspect of her right ankle by a wasp 1 week ago, reports persistent itching, erythema, burning sensation. Symptoms are better when she scratches it,. Has been taking Benadryl intermittently for the erythema and itching, with mild improvement. No other aggravating or alleviating factors. States that the erythema is now surrounding her entire ankle. No edema, nausea, vomiting, fevers purulent drainage from the sting site. Tetanus is up-to-date. She is not a smoker or a diabetic.  ROS as noted in HPI. All other ROS negative.   The history is provided by the patient. No language interpreter was used.    Past Medical History  Diagnosis Date  . Fibromyalgia   . Anxiety   . Depression   . Hyperlipidemia   . Asthma   . Degenerative joint disease   . Osteoarthritis   . Belchings   . Lumbar post-laminectomy syndrome   . Lumbosacral neuritis   . Disturbance of skin sensation   . Fibromyalgia   . Sciatica   . Chronic pain syndrome   . Other chronic postoperative pain     Past Surgical History  Procedure Date  . Back surgery 1997, 2003  . Abdominal hysterectomy   . Cystectomy   . Diagnostic laparoscopy     Family History  Problem Relation Age of Onset  . Hypertension Mother   . Hypertension Father   . Breast cancer Maternal Grandmother   . Liver cancer Maternal Grandmother   . Cancer Maternal Grandmother     breast cancer  . Ovarian cancer Cousin   . Cancer Cousin     1st c. maternal side/ cervical cancer  . Diabetes Maternal Grandfather     both sides  . Cancer Cousin     1st c maternal/ colon cancer    History  Substance Use Topics   . Smoking status: Former Smoker    Types: Cigarettes    Quit date: 07/31/2007  . Smokeless tobacco: Never Used  . Alcohol Use: No    OB History    Grav Para Term Preterm Abortions TAB SAB Ect Mult Living                  Review of Systems  Allergies  Aspirin; Nsaids; Sulfonamide derivatives; Cymbalta; Gabapentin; and Pregabalin  Home Medications   Current Outpatient Rx  Name Route Sig Dispense Refill  . BENADRYL ALLERGY PO Oral Take by mouth.    . B COMPLEX PO TABS Oral Take 1 tablet by mouth daily.      . CEPHALEXIN 500 MG PO CAPS Oral Take 1 capsule (500 mg total) by mouth 4 (four) times daily. X 10 days 40 capsule 0  . ESOMEPRAZOLE MAGNESIUM 40 MG PO CPDR Oral Take 1 capsule (40 mg total) by mouth 2 (two) times daily. 60 capsule 3  . FAMOTIDINE 20 MG PO TABS Oral Take 1 tablet (20 mg total) by mouth 2 (two) times daily. 10 tablet 0  . MAGNESIUM 30 MG PO TABS Oral Take by mouth 1 day or 1 dose.      Marland Kitchen MILNACIPRAN HCL 12.5 MG PO TABS Oral Take 1 tablet (12.5 mg total) by mouth 2 (two) times daily  before a meal. 60 tablet 5  . MORPHINE SULFATE ER 30 MG PO TBCR Oral Take 1 tablet (30 mg total) by mouth 3 (three) times daily. 90 tablet 0  . ADULT MULTIVITAMIN W/MINERALS CH Oral Take 1 tablet by mouth daily.    Marland Kitchen MUPIROCIN 2 % EX OINT Topical Apply topically 3 (three) times daily. Apply after warm soak for 10 minutes 22 g 0  . OVER THE COUNTER MEDICATION  calcium    . TIZANIDINE HCL 4 MG PO TABS Oral Take 1 tablet (4 mg total) by mouth 2 (two) times daily. 60 tablet 5    BP 109/72  Pulse 72  Temp 98.9 F (37.2 C) (Oral)  Resp 18  SpO2 100%  Physical Exam  Nursing note and vitals reviewed. Constitutional: She is oriented to person, place, and time. She appears well-developed and well-nourished. No distress.  HENT:  Head: Normocephalic and atraumatic.  Eyes: Conjunctivae and EOM are normal.  Neck: Normal range of motion.  Cardiovascular: Normal rate.   Pulmonary/Chest:  Effort normal.  Abdominal: She exhibits no distension.  Musculoskeletal: Normal range of motion.       Right ankle: She exhibits normal pulse. Achilles tendon normal.       Feet:       6 x 6 cm area erythema medially with central punctate lesion consistent with a lasting. Erythema 5.5 x 3 cm laterally see drawing. No bony tenderness, edema, induration, expressible purulent drainage.  Neurological: She is alert and oriented to person, place, and time. Coordination normal.  Skin: Skin is warm and dry.  Psychiatric: She has a normal mood and affect. Her behavior is normal. Judgment and thought content normal.    ED Course  Procedures (including critical care time)  Labs Reviewed - No data to display No results found.   1. Infected insect bite or sting     MDM  Appears to have secondary cellulitis from wasp sting. No evidence of septic joint. Home with Keflex, Bactroban, wound care, ice, elevation.. Patient to continue Benadryl and will start her on Pepcid for the itching and irritation from the wasp sting. Marked areas of erythema with a permanent pen for  reference. Discussed signs and symptoms that should prompt return to the department. She'll followup with her primary care physician as needed. Patient agrees with plan.  Luiz Blare, MD 04/09/12 [phone]

## 2012-04-09 NOTE — ED Notes (Signed)
Reports being stung by a wasp.  Stinging occurred last Monday and points to red dot on back of ankle as location of sting.  Reports particularly over the last few days has noticed increased swelling and pain.  Itching continues.

## 2012-04-10 ENCOUNTER — Encounter: Payer: Self-pay | Admitting: Gastroenterology

## 2012-04-10 ENCOUNTER — Ambulatory Visit (INDEPENDENT_AMBULATORY_CARE_PROVIDER_SITE_OTHER): Payer: Medicaid Other | Admitting: Gastroenterology

## 2012-04-10 VITALS — BP 108/70 | HR 68 | Ht 64.5 in | Wt 139.6 lb

## 2012-04-10 DIAGNOSIS — K5900 Constipation, unspecified: Secondary | ICD-10-CM

## 2012-04-10 MED ORDER — MOVIPREP 100 G PO SOLR: 1.0000 | ORAL | Status: DC

## 2012-04-10 MED ORDER — LINACLOTIDE 145 MCG PO CAPS: 145.0000 ug | ORAL_CAPSULE | Freq: Every day | ORAL | Status: DC

## 2012-04-10 NOTE — Patient Instructions (Addendum)
Trial of linzess, one pill once a day. Samples given for 2-3 week trial. Call if this helps and a script can be written. You will be set up for a colonoscopy for constipation, minor rectal bleeding with MAC sedation.

## 2012-04-10 NOTE — Progress Notes (Signed)
HPI: This is a   very pleasant 46 year old woman whom I last saw about 9 months ago the time of an upper endoscopy. That was done for intermittent abdominal pains and it was normal essentially.  She is here today to discuss a different problem.   Cousin recently diagnosed with colon cancer at age 68.  SHe is very bothered by constipation. Very scibbolous stools.  She tried fiber supplements.  Miralax for 2-3 weeks.  STays hyrdated.  Never blood in her stool except at times of straining.  Overall she has lost weight lately.  On morphine.    Past Medical History  Diagnosis Date  . Fibromyalgia   . Anxiety   . Depression   . Hyperlipidemia   . Asthma   . Degenerative joint disease   . Osteoarthritis   . Belchings   . Lumbar post-laminectomy syndrome   . Lumbosacral neuritis   . Disturbance of skin sensation   . Sciatica   . Chronic pain syndrome   . Other chronic postoperative pain     Past Surgical History  Procedure Date  . Back surgery 1997, 2003  . Abdominal hysterectomy   . Cystectomy   . Diagnostic laparoscopy     Current Outpatient Prescriptions  Medication Sig Dispense Refill  . b complex vitamins tablet Take 1 tablet by mouth daily.        . cephALEXin (KEFLEX) 500 MG capsule Take 1 capsule (500 mg total) by mouth 4 (four) times daily. X 10 days  40 capsule  0  . DiphenhydrAMINE HCl (BENADRYL ALLERGY PO) Take by mouth.      . esomeprazole (NEXIUM) 40 MG capsule Take 1 capsule (40 mg total) by mouth 2 (two) times daily.  60 capsule  3  . famotidine (PEPCID) 20 MG tablet Take 1 tablet (20 mg total) by mouth 2 (two) times daily.  10 tablet  0  . magnesium 30 MG tablet Take by mouth 1 day or 1 dose.        . Milnacipran HCl (SAVELLA) 12.5 MG TABS Take 1 tablet (12.5 mg total) by mouth 2 (two) times daily before a meal.  60 tablet  5  . morphine (MS CONTIN) 30 MG 12 hr tablet Take 1 tablet (30 mg total) by mouth 3 (three) times daily.  90 tablet  0  . Multiple Vitamin  (MULITIVITAMIN WITH MINERALS) TABS Take 1 tablet by mouth daily.      . mupirocin ointment (BACTROBAN) 2 % Apply topically 3 (three) times daily. Apply after warm soak for 10 minutes  22 g  0  . OVER THE COUNTER MEDICATION calcium      . tiZANidine (ZANAFLEX) 4 MG tablet Take 1 tablet (4 mg total) by mouth 2 (two) times daily.  60 tablet  5    Allergies as of 04/10/2012 - Review Complete 04/10/2012  Allergen Reaction Noted  . Aspirin Shortness Of Breath 07/22/2008  . Nsaids Shortness Of Breath 07/22/2008  . Sulfonamide derivatives Hives 07/22/2008  . Cymbalta (duloxetine hcl) Other (See Comments) 09/14/2011  . Gabapentin Other (See Comments) 07/22/2008  . Pregabalin  10/22/2008    Family History  Problem Relation Age of Onset  . Hypertension Mother   . Hypertension Father   . Breast cancer Maternal Grandmother   . Liver cancer Maternal Grandmother   . Cancer Maternal Grandmother     breast cancer  . Ovarian cancer Cousin   . Cancer Cousin     1st c. maternal side/  cervical cancer  . Diabetes Maternal Grandfather     both sides  . Cancer Cousin     1st c maternal/ colon cancer    History   Social History  . Marital Status: Married    Spouse Name: N/A    Number of Children: 4  . Years of Education: N/A   Occupational History  . disabled    Social History Main Topics  . Smoking status: Former Smoker    Types: Cigarettes    Quit date: 07/31/2007  . Smokeless tobacco: Never Used  . Alcohol Use: No  . Drug Use: No  . Sexually Active: Yes    Birth Control/ Protection: Surgical   Other Topics Concern  . Not on file   Social History Narrative  . No narrative on file      Physical Exam: BP 108/70  Pulse 68  Ht 5' 4.5" (1.638 m)  Wt 139 lb 9.6 oz (63.322 kg)  BMI 23.59 kg/m2 Constitutional: generally well-appearing Psychiatric: alert and oriented x3 Abdomen: soft, nontender, nondistended, no obvious ascites, no peritoneal signs, normal bowel  sounds     Assessment and plan: 46 y.o. female with chronic constipation intermittent rectal bleeding  First I suspect constipation is at least partly due to the 90 mg of morphine which she takes daily.  I think we should still proceed with colonoscopy to check for other possible causes of constipation as well as her intermittent rectal bleeding. I'm giving her a trial of linze which she'll take once daily and will call back to report on her symptoms in 2-3 weeks and if she has noticed significant improvement on happy to write a longer term prescription for it.

## 2012-04-14 ENCOUNTER — Encounter: Payer: Self-pay | Admitting: Physical Medicine and Rehabilitation

## 2012-04-21 ENCOUNTER — Ambulatory Visit (AMBULATORY_SURGERY_CENTER): Payer: PRIVATE HEALTH INSURANCE | Admitting: Gastroenterology

## 2012-04-21 ENCOUNTER — Encounter: Payer: Self-pay | Admitting: Gastroenterology

## 2012-04-21 VITALS — BP 114/77 | HR 77 | Temp 98.0°F | Resp 20 | Ht 63.0 in | Wt 139.0 lb

## 2012-04-21 DIAGNOSIS — K5900 Constipation, unspecified: Secondary | ICD-10-CM

## 2012-04-21 DIAGNOSIS — K649 Unspecified hemorrhoids: Secondary | ICD-10-CM

## 2012-04-21 MED ORDER — SODIUM CHLORIDE 0.9 % IV SOLN: 500.0000 mL | INTRAVENOUS | Status: DC

## 2012-04-21 NOTE — Progress Notes (Signed)
Patient stating  results from Moviprep are at present brown liquid no solids. Informed Dr. Christella Hartigan, no orders received.

## 2012-04-21 NOTE — Progress Notes (Signed)
Propofol given and oxygen managed per Rosemarie Beath CRNA

## 2012-04-21 NOTE — Op Note (Signed)
[facility] [address] [place], [address]   COLONOSCOPY PROCEDURE REPORT  PATIENT: Cindy, Pratt  MR#: [id number] BIRTHDATE: 10-24-1965 , 46  yrs. old GENDER: Female ENDOSCOPIST: Rachael Fee, MD REFERRED GM:WNUUV Toni Arthurs, NP PROCEDURE DATE:  04/21/2012 PROCEDURE:   Colonoscopy, diagnostic ASA CLASS:   Class III INDICATIONS:chronic constipation, minor rectal bleeding. MEDICATIONS: propofol (Diprivan) 150mg  IV  DESCRIPTION OF PROCEDURE:   After the risks benefits and alternatives of the procedure were thoroughly explained, informed consent was obtained.  A digital rectal exam revealed no abnormalities of the rectum.   The LB [id number]  endoscope was introduced through the anus and advanced to the cecum, which was identified by both the appendix and ileocecal valve. No adverse events experienced.   The quality of the prep was fair.  The instrument was then slowly withdrawn as the colon was fully examined.    COLON FINDINGS: A normal appearing cecum, ileocecal valve, and appendiceal orifice were identified.  The ascending, hepatic flexure, transverse, splenic flexure, descending, sigmoid colon and rectum appeared unremarkable.  No polyps or cancers were seen. Internal hemorrhoids were found.  Retroflexed views revealed no abnormalities. The time to cecum=4 minutes 0 seconds  Withdrawal time=5 minutes 48 seconds.  The scope was withdrawn and the procedure completed.  COMPLICATIONS: There were no complications.  ENDOSCOPIC IMPRESSION: 1.   Normal colon; no cancers or polyps 2.   Small internal hemorrhoids  RECOMMENDATIONS: Please call Dr.  Christella Hartigan' office to set up return visit in 2-3 weeks to discuss other options to treat your constipation since Linzess has not helped at all. Your chronic high dose narcotic pain medicines are causing or at least signficantly contribute to your constipation.  Cuttting back as much as possible will help  your bowel function.  eSigned:  Rachael Fee, MD 04/21/2012 11:50 AM

## 2012-04-21 NOTE — Patient Instructions (Addendum)
Discharge instructions given with verbal understanding. Handouts on hemorrhoids and a high fiber diet given. Resume previous medications. YOU HAD AN ENDOSCOPIC PROCEDURE TODAY AT THE [facility]: Refer to the procedure report that was given to you for any specific questions about what was found during the examination.  If the procedure report does not answer your questions, please call your gastroenterologist to clarify.  If you requested that your care partner not be given the details of your procedure findings, then the procedure report has been included in a sealed envelope for you to review at your convenience later.  YOU SHOULD EXPECT: Some feelings of bloating in the abdomen. Passage of more gas than usual.  Walking can help get rid of the air that was put into your GI tract during the procedure and reduce the bloating. If you had a lower endoscopy (such as a colonoscopy or flexible sigmoidoscopy) you may notice spotting of blood in your stool or on the toilet paper. If you underwent a bowel prep for your procedure, then you may not have a normal bowel movement for a few days.  DIET: Your first meal following the procedure should be a light meal and then it is ok to progress to your normal diet.  A half-sandwich or bowl of soup is an example of a good first meal.  Heavy or fried foods are harder to digest and may make you feel nauseous or bloated.  Likewise meals heavy in dairy and vegetables can cause extra gas to form and this can also increase the bloating.  Drink plenty of fluids but you should avoid alcoholic beverages for 24 hours.  ACTIVITY: Your care partner should take you home directly after the procedure.  You should plan to take it easy, moving slowly for the rest of the day.  You can resume normal activity the day after the procedure however you should NOT DRIVE or use heavy machinery for 24 hours (because of the sedation medicines used during the test).    SYMPTOMS TO  REPORT IMMEDIATELY: A gastroenterologist can be reached at any hour.  During normal business hours, 8:30 AM to 5:00 PM Monday through Friday, call [phone].  After hours and on weekends, please call the GI answering service at [phone] who will take a message and have the physician on call contact you.   Following lower endoscopy (colonoscopy or flexible sigmoidoscopy):  Excessive amounts of blood in the stool  Significant tenderness or worsening of abdominal pains  Swelling of the abdomen that is new, acute  Fever of 100F or higher FOLLOW UP: If any biopsies were taken you will be contacted by phone or by letter within the next 1-3 weeks.  Call your gastroenterologist if you have not heard about the biopsies in 3 weeks.  Our staff will call the home number listed on your records the next business day following your procedure to check on you and address any questions or concerns that you may have at that time regarding the information given to you following your procedure. This is a courtesy call and so if there is no answer at the home number and we have not heard from you through the emergency physician on call, we will assume that you have returned to your regular daily activities without incident.  SIGNATURES/CONFIDENTIALITY: You and/or your care partner have signed paperwork which will be entered into your electronic medical record.  These signatures attest to the fact that that the information above on your   After Visit Summary has been reviewed and is understood.  Full responsibility of the confidentiality of this discharge information lies with you and/or your care-partner.  

## 2012-04-24 ENCOUNTER — Telehealth: Payer: Self-pay

## 2012-04-24 NOTE — Telephone Encounter (Signed)
Left message on answering machine. 

## 2012-05-02 ENCOUNTER — Encounter
Payer: PRIVATE HEALTH INSURANCE | Attending: Physical Medicine and Rehabilitation | Admitting: Physical Medicine and Rehabilitation

## 2012-05-02 DIAGNOSIS — G8929 Other chronic pain: Secondary | ICD-10-CM | POA: Insufficient documentation

## 2012-05-02 DIAGNOSIS — IMO0001 Reserved for inherently not codable concepts without codable children: Secondary | ICD-10-CM | POA: Insufficient documentation

## 2012-05-02 DIAGNOSIS — M961 Postlaminectomy syndrome, not elsewhere classified: Secondary | ICD-10-CM | POA: Insufficient documentation

## 2012-05-02 DIAGNOSIS — R5381 Other malaise: Secondary | ICD-10-CM | POA: Insufficient documentation

## 2012-05-02 DIAGNOSIS — R5383 Other fatigue: Secondary | ICD-10-CM | POA: Insufficient documentation

## 2012-05-02 DIAGNOSIS — M542 Cervicalgia: Secondary | ICD-10-CM | POA: Insufficient documentation

## 2012-05-04 ENCOUNTER — Encounter (HOSPITAL_BASED_OUTPATIENT_CLINIC_OR_DEPARTMENT_OTHER): Payer: PRIVATE HEALTH INSURANCE | Admitting: Physical Medicine and Rehabilitation

## 2012-05-04 ENCOUNTER — Encounter: Payer: Self-pay | Admitting: Physical Medicine and Rehabilitation

## 2012-05-04 VITALS — BP 105/66 | HR 66 | Resp 14 | Ht 63.0 in | Wt 134.0 lb

## 2012-05-04 DIAGNOSIS — G894 Chronic pain syndrome: Secondary | ICD-10-CM

## 2012-05-04 DIAGNOSIS — M797 Fibromyalgia: Secondary | ICD-10-CM

## 2012-05-04 DIAGNOSIS — IMO0001 Reserved for inherently not codable concepts without codable children: Secondary | ICD-10-CM

## 2012-05-04 MED ORDER — TIZANIDINE HCL 4 MG PO TABS: 4.0000 mg | ORAL_TABLET | Freq: Two times a day (BID) | ORAL | Status: DC

## 2012-05-04 MED ORDER — MORPHINE SULFATE ER 30 MG PO TBCR: 30.0000 mg | EXTENDED_RELEASE_TABLET | Freq: Three times a day (TID) | ORAL | Status: DC

## 2012-05-04 NOTE — Patient Instructions (Signed)
Advised patient to exercises more, try to exercise 30 min about 5 times per week.

## 2012-05-04 NOTE — Progress Notes (Signed)
Subjective:    Patient ID: Cindy Pratt, female    DOB: 08-Jan-1966, 46 y.o.   MRN: [id number]  HPI The patient complains about chronic low back pain . The patient also complains about pain all over her body, and she feels very fatigued.She reports that she has some GI issues, and that she feels a little sick . The problem has been stable otherwise.   Pain Inventory Average Pain 7 Pain Right Now 9 My pain is intermittent, constant, sharp, burning, dull, stabbing, tingling and aching  In the last 24 hours, has pain interfered with the following? General activity 9 Relation with others 9 Enjoyment of life 9 What TIME of day is your pain at its worst? all the time Sleep (in general) Poor  Pain is worse with: walking, bending, sitting, inactivity, standing and some activites Pain improves with: injections Relief from Meds: 7  Mobility walk without assistance how many minutes can you walk? 10 ability to climb steps?  yes do you drive?  yes Do you have any goals in this area?  no  Function not employed: date last employed   Neuro/Psych numbness tingling depression anxiety  Prior Studies colonoscopy  Physicians involved in your care Any changes since last visit?  no   Family History  Problem Relation Age of Onset  . Hypertension Mother   . Hypertension Father   . Breast cancer Maternal Grandmother   . Liver cancer Maternal Grandmother   . Cancer Maternal Grandmother     breast cancer  . Ovarian cancer Cousin   . Cancer Cousin     1st c. maternal side/ cervical cancer  . Diabetes Maternal Grandfather     both sides  . Cancer Cousin     1st c maternal/ colon cancer   History   Social History  . Marital Status: Married    Spouse Name: N/A    Number of Children: 4  . Years of Education: N/A   Occupational History  . disabled    Social History Main Topics  . Smoking status: Former Smoker    Types: Cigarettes    Quit date: 07/31/2007  . Smokeless  tobacco: Never Used  . Alcohol Use: No  . Drug Use: No  . Sexually Active: Yes    Birth Control/ Protection: Surgical   Other Topics Concern  . None   Social History Narrative  . None   Past Surgical History  Procedure Date  . Back surgery 1997, 2003  . Abdominal hysterectomy   . Cystectomy   . Diagnostic laparoscopy    Past Medical History  Diagnosis Date  . Fibromyalgia   . Anxiety   . Depression   . Hyperlipidemia   . Asthma   . Degenerative joint disease   . Osteoarthritis   . Belchings   . Lumbar post-laminectomy syndrome   . Lumbosacral neuritis   . Disturbance of skin sensation   . Sciatica   . Chronic pain syndrome   . Other chronic postoperative pain    BP 105/66  Pulse 66  Resp 14  Ht 5\' 3"  (1.6 m)  Wt 134 lb (60.782 kg)  BMI 23.74 kg/m2  SpO2 99%     Review of Systems  Constitutional: Positive for fever and diaphoresis.  HENT: Positive for neck pain.   Gastrointestinal: Positive for nausea and abdominal pain.  Musculoskeletal: Positive for myalgias and arthralgias.  Neurological: Positive for numbness.  Psychiatric/Behavioral: Positive for dysphoric mood. The patient is nervous/anxious.   All  other systems reviewed and are negative.       Objective:   Physical Exam Constitutional: She is oriented to person, place, and time. She appears well-developed and well-nourished.  HENT:  Head: Normocephalic.  Neck: Neck supple.  Musculoskeletal: She exhibits tenderness.  Neurological: She is alert and oriented to person, place, and time.  Skin: Skin is warm and dry.  Psychiatric: She has a normal mood and affect.  Symmetric normal motor tone is noted throughout. Normal muscle bulk. Muscle testing reveals 5/5 muscle strength of the upper extremity, and 5/5 of the lower extremity, except give away weakness on left iliopsoas 4-?5, and weakness in tibialis anterior 4-/5. Full range of motion in upper and lower extremities. ROM of spine is restricted.  Fine motor movements are normal in both hands.  DTR in the upper and lower extremity are present and symmetric 2+. No clonus is noted.  Patient arises from chair without difficulty. Narrow based gait with normal arm swing bilateral , able to stand on heels and toes . Tandem walk is stable. No pronator drift. Rhomberg negative.  Head protruded, hyper extension of C-spine.         Assessment & Plan:  1. Lumbar postlaminectomy syndrome status post L5-S1 fusion. Last MRI approximately one year ago showing no significant adjacent level disease.her chronic pain is generally relieved with narcotics although the duration of effect is short. She reports that being on morphine sulfate 30 mg three times a day, has improved her pain controll.  2. fibromyalgia syndrome improved on current medications-Savella  3. neck pain, x-ray of her neck showed mild degenerative changes.Ordered physical therapy, with modalities for pain relief, at her last visit, the patient reports, that nobody from PT has called her back. I ask the front desk staff to follow up on this.. Advised patient to increase her walking and exercising from 10 min 3 times a week, to at least 30 min 5 times a week. Wrote patient an out of school note for today and tomorrow, because she feels very sick, and unable to attend class. PA visit one month

## 2012-05-31 ENCOUNTER — Encounter: Payer: Self-pay | Admitting: Physical Medicine and Rehabilitation

## 2012-05-31 ENCOUNTER — Encounter
Payer: PRIVATE HEALTH INSURANCE | Attending: Physical Medicine and Rehabilitation | Admitting: Physical Medicine and Rehabilitation

## 2012-05-31 VITALS — BP 101/70 | HR 98 | Resp 14 | Ht 63.0 in | Wt 133.0 lb

## 2012-05-31 DIAGNOSIS — IMO0001 Reserved for inherently not codable concepts without codable children: Secondary | ICD-10-CM | POA: Insufficient documentation

## 2012-05-31 DIAGNOSIS — G8929 Other chronic pain: Secondary | ICD-10-CM | POA: Insufficient documentation

## 2012-05-31 DIAGNOSIS — R5381 Other malaise: Secondary | ICD-10-CM | POA: Insufficient documentation

## 2012-05-31 DIAGNOSIS — M545 Low back pain, unspecified: Secondary | ICD-10-CM | POA: Insufficient documentation

## 2012-05-31 DIAGNOSIS — M961 Postlaminectomy syndrome, not elsewhere classified: Secondary | ICD-10-CM

## 2012-05-31 DIAGNOSIS — M797 Fibromyalgia: Secondary | ICD-10-CM

## 2012-05-31 MED ORDER — MORPHINE SULFATE ER 30 MG PO TBCR: 30.0000 mg | EXTENDED_RELEASE_TABLET | Freq: Three times a day (TID) | ORAL | Status: DC

## 2012-05-31 NOTE — Patient Instructions (Signed)
Start with exercising in the gym, water aerobics would be very beneficial.

## 2012-05-31 NOTE — Progress Notes (Signed)
Subjective:    Patient ID: Cindy Pratt, female    DOB: 1966/08/08, 46 y.o.   MRN: [id number]  HPI The patient complains about chronic low back pain . The patient also complains about pain all over her body, and she feels very fatigued.She is asking whether we could increase her Savella. The problem has been stable otherwise.   Pain Inventory Average Pain 8 Pain Right Now 7 My pain is intermittent, constant, sharp, burning, dull, stabbing, tingling and aching  In the last 24 hours, has pain interfered with the following? General activity 8 Relation with others 8 Enjoyment of life 8 What TIME of day is your pain at its worst? all the time Sleep (in general) Poor  Pain is worse with: walking, bending, sitting, inactivity, standing and some activites Pain improves with: medication Relief from Meds: 6  Mobility walk without assistance Do you have any goals in this area?  no  Function not employed: date last employed  Do you have any goals in this area?  no  Neuro/Psych weakness numbness tingling depression anxiety  Prior Studies Any changes since last visit?  no  Physicians involved in your care Any changes since last visit?  no   Family History  Problem Relation Age of Onset  . Hypertension Mother   . Hypertension Father   . Breast cancer Maternal Grandmother   . Liver cancer Maternal Grandmother   . Cancer Maternal Grandmother     breast cancer  . Ovarian cancer Cousin   . Cancer Cousin     1st c. maternal side/ cervical cancer  . Diabetes Maternal Grandfather     both sides  . Cancer Cousin     1st c maternal/ colon cancer   History   Social History  . Marital Status: Married    Spouse Name: N/A    Number of Children: 4  . Years of Education: N/A   Occupational History  . disabled    Social History Main Topics  . Smoking status: Former Smoker    Types: Cigarettes    Quit date: 07/31/2007  . Smokeless tobacco: Never Used  . Alcohol Use: No   . Drug Use: No  . Sexually Active: Yes    Birth Control/ Protection: Surgical   Other Topics Concern  . None   Social History Narrative  . None   Past Surgical History  Procedure Date  . Back surgery 1997, 2003  . Abdominal hysterectomy   . Cystectomy   . Diagnostic laparoscopy    Past Medical History  Diagnosis Date  . Fibromyalgia   . Anxiety   . Depression   . Hyperlipidemia   . Asthma   . Degenerative joint disease   . Osteoarthritis   . Belchings   . Lumbar post-laminectomy syndrome   . Lumbosacral neuritis   . Disturbance of skin sensation   . Sciatica   . Chronic pain syndrome   . Other chronic postoperative pain    BP 101/70  Pulse 98  Resp 14  Ht 5\' 3"  (1.6 m)  Wt 133 lb (60.328 kg)  BMI 23.56 kg/m2  SpO2 96%     Review of Systems  Musculoskeletal: Positive for myalgias, back pain and arthralgias.  Neurological: Positive for weakness and numbness.  Psychiatric/Behavioral: Positive for dysphoric mood. The patient is nervous/anxious.   All other systems reviewed and are negative.       Objective:   Physical Exam Constitutional: She is oriented to person, place, and time.  She appears well-developed and well-nourished.  HENT:  Head: Normocephalic.  Neck: Neck supple.  Musculoskeletal: She exhibits tenderness.  Neurological: She is alert and oriented to person, place, and time.  Skin: Skin is warm and dry.  Psychiatric: She has a normal mood and affect.  Symmetric normal motor tone is noted throughout. Normal muscle bulk. Muscle testing reveals 5/5 muscle strength of the upper extremity, and 5/5 of the lower extremity, except give away weakness on left iliopsoas 4-?5, and weakness in tibialis anterior 4-/5. Full range of motion in upper and lower extremities. ROM of spine is restricted. Fine motor movements are normal in both hands.  DTR in the upper and lower extremity are present and symmetric 2+. No clonus is noted.  Patient arises from chair  without difficulty. Narrow based gait with normal arm swing bilateral , able to stand on heels and toes . Tandem walk is stable. No pronator drift. Rhomberg negative.  Head protruded, hyper extension of C-spine.         Assessment & Plan:  1. Lumbar postlaminectomy syndrome status post L5-S1 fusion. Last MRI approximately one year ago showing no significant adjacent level disease.her chronic pain is generally relieved with narcotics although the duration of effect is short. She reports that being on morphine sulfate 30 mg three times a day, has improved her pain controll.  2. fibromyalgia syndrome improved some on current medications-Savella , patient was taking 12.5 mg bid, the starting dose, I increased it to 25mg  bid. Average dose is 50mg  bid.  3. neck pain, x-ray of her neck showed mild degenerative changes.Ordered physical therapy, with modalities for pain relief, at her last visit, the patient reports, that nobody from PT has called her back. I ask the front desk staff to follow up on this.. Advised patient to increase her walking and exercising from 10 min 3 times a week, to at least 30 min 5 times a week. PA visit one month

## 2012-06-16 ENCOUNTER — Telehealth: Payer: Self-pay | Admitting: *Deleted

## 2012-06-16 MED ORDER — MILNACIPRAN HCL 25 MG PO TABS: 25.0000 mg | ORAL_TABLET | Freq: Two times a day (BID) | ORAL | Status: DC

## 2012-06-16 NOTE — Telephone Encounter (Signed)
Savella 25 mg bid refilled and [facility] notified.

## 2012-06-16 NOTE — Telephone Encounter (Signed)
Needs refill on her savella.  Clydie Braun increased to to 25 mg.

## 2012-07-03 ENCOUNTER — Encounter: Payer: PRIVATE HEALTH INSURANCE | Admitting: Physical Medicine and Rehabilitation

## 2012-07-04 ENCOUNTER — Encounter
Payer: PRIVATE HEALTH INSURANCE | Attending: Physical Medicine and Rehabilitation | Admitting: Physical Medicine and Rehabilitation

## 2012-07-04 ENCOUNTER — Encounter: Payer: Self-pay | Admitting: Physical Medicine and Rehabilitation

## 2012-07-04 VITALS — BP 111/71 | HR 104 | Resp 18 | Ht 63.0 in | Wt 134.0 lb

## 2012-07-04 DIAGNOSIS — G894 Chronic pain syndrome: Secondary | ICD-10-CM

## 2012-07-04 DIAGNOSIS — M542 Cervicalgia: Secondary | ICD-10-CM | POA: Insufficient documentation

## 2012-07-04 DIAGNOSIS — R5381 Other malaise: Secondary | ICD-10-CM | POA: Insufficient documentation

## 2012-07-04 DIAGNOSIS — M47812 Spondylosis without myelopathy or radiculopathy, cervical region: Secondary | ICD-10-CM

## 2012-07-04 DIAGNOSIS — M545 Low back pain, unspecified: Secondary | ICD-10-CM | POA: Insufficient documentation

## 2012-07-04 DIAGNOSIS — IMO0001 Reserved for inherently not codable concepts without codable children: Secondary | ICD-10-CM | POA: Insufficient documentation

## 2012-07-04 DIAGNOSIS — M961 Postlaminectomy syndrome, not elsewhere classified: Secondary | ICD-10-CM

## 2012-07-04 DIAGNOSIS — G8929 Other chronic pain: Secondary | ICD-10-CM | POA: Insufficient documentation

## 2012-07-04 MED ORDER — MORPHINE SULFATE ER 30 MG PO TBCR: 30.0000 mg | EXTENDED_RELEASE_TABLET | Freq: Three times a day (TID) | ORAL | Status: DC

## 2012-07-04 NOTE — Patient Instructions (Signed)
Continue with your walking program, follow up with your PCP.

## 2012-07-04 NOTE — Progress Notes (Signed)
Subjective:   HPI The patient complains about chronic low back pain . The patient also complains about pain all over her body, and she feels very fatigued. We  increased her Savella, at the last visit, which has helped her with her complains.  The problem has been stable otherwise.   Pain Inventory Average Pain 8 Pain Right Now 9 My pain is constant, sharp, burning, dull, stabbing, tingling and aching  In the last 24 hours, has pain interfered with the following? General activity 10 Relation with others 10 Enjoyment of life 10 What TIME of day is your pain at its worst? all day  Sleep (in general) Poor  Pain is worse with: walking, bending, sitting, inactivity and standing Pain improves with: rest, heat/ice, therapy/exercise, pacing activities, medication, TENS and injections Relief from Meds: 5  Mobility walk without assistance how many minutes can you walk? 10 ability to climb steps?  yes do you drive?  yes Do you have any goals in this area?  no  Function disabled: date disabled 2003 I need assistance with the following:  meal prep, household duties and shopping Do you have any goals in this area?  no  Neuro/Psych weakness numbness tingling depression anxiety  Prior Studies Any changes since last visit?  no  Physicians involved in your care Any changes since last visit?  no To resume care w/ Dr Toni Arthurs in [place]   Family History  Problem Relation Age of Onset  . Hypertension Mother   . Hypertension Father   . Breast cancer Maternal Grandmother   . Liver cancer Maternal Grandmother   . Cancer Maternal Grandmother     breast cancer  . Ovarian cancer Cousin   . Cancer Cousin     1st c. maternal side/ cervical cancer  . Diabetes Maternal Grandfather     both sides  . Cancer Cousin     1st c maternal/ colon cancer   History   Social History  . Marital Status: Married    Spouse Name: N/A    Number of Children: 4  . Years of Education: N/A    Occupational History  . disabled    Social History Main Topics  . Smoking status: Former Smoker    Types: Cigarettes    Quit date: 07/31/2007  . Smokeless tobacco: Never Used  . Alcohol Use: No  . Drug Use: No  . Sexually Active: Yes    Birth Control/ Protection: Surgical   Other Topics Concern  . None   Social History Narrative  . None   Past Surgical History  Procedure Date  . Back surgery 1997, 2003  . Abdominal hysterectomy   . Cystectomy   . Diagnostic laparoscopy    Past Medical History  Diagnosis Date  . Fibromyalgia   . Anxiety   . Depression   . Hyperlipidemia   . Asthma   . Degenerative joint disease   . Osteoarthritis   . Belchings   . Lumbar post-laminectomy syndrome   . Lumbosacral neuritis   . Disturbance of skin sensation   . Sciatica   . Chronic pain syndrome   . Other chronic postoperative pain    BP 111/71  Pulse 104  Resp 18  Ht 5\' 3"  (1.6 m)  Wt 134 lb (60.782 kg)  BMI 23.74 kg/m2  SpO2 100%    Patient ID: Cindy Pratt, female    DOB: 1966/06/15, 46 y.o.   MRN: [id number]  HPI    Review of Systems  Constitutional:  Positive for fever and chills.       Hypoglycemia  HENT: Negative.   Eyes: Negative.   Respiratory: Negative.   Cardiovascular: Negative.   Gastrointestinal: Positive for diarrhea.  Genitourinary: Negative.   Musculoskeletal: Positive for myalgias, back pain and arthralgias.  Skin: Negative.   Neurological: Positive for weakness.  Hematological: Negative.   Psychiatric/Behavioral: Positive for sleep disturbance and dysphoric mood. The patient is nervous/anxious.        Objective:   Physical Exam Constitutional: She is oriented to person, place, and time. She appears well-developed and well-nourished.  HENT:  Head: Normocephalic.  Neck: Neck supple.  Musculoskeletal: She exhibits tenderness.  Neurological: She is alert and oriented to person, place, and time.  Skin: Skin is warm and dry.  Psychiatric:  She has a normal mood and affect.  Symmetric normal motor tone is noted throughout. Normal muscle bulk. Muscle testing reveals 5/5 muscle strength of the upper extremity, and 5/5 of the lower extremity, except give away weakness on left iliopsoas 4-?5, and weakness in tibialis anterior 4-/5. Full range of motion in upper and lower extremities. ROM of spine is restricted. Fine motor movements are normal in both hands.  DTR in the upper and lower extremity are present and symmetric 2+. No clonus is noted.  Patient arises from chair without difficulty. Narrow based gait with normal arm swing bilateral , able to stand on heels and toes . Tandem walk is stable. No pronator drift. Rhomberg negative.  Head protruded, hyper extension of C-spine.         Assessment & Plan:  1. Lumbar postlaminectomy syndrome status post L5-S1 fusion. Last MRI approximately one year ago showing no significant adjacent level disease.her chronic pain is generally relieved with narcotics although the duration of effect is short. She reports that being on morphine sulfate 30 mg three times a day, has improved her pain controll.  2. fibromyalgia syndrome improved some on current medications-Savella , patient was taking 12.5 mg bid, the starting dose, I increased it to 25mg  bid. Average dose is 50mg  bid. She is doing better with the 25mg . 3. neck pain, x-ray of her neck showed mild degenerative changes.Ordered physical therapy, with modalities for pain relief, at her last visit, the patient reports, that nobody from PT has called her back. I ask the front desk staff to follow up on this. She states, that this happened again, I went to the front desk again, hopefully this will be resolved now. Reordered PT. Advised patient to increase her walking and exercising from 10 min 3 times a week, to at least 30 min 5 times a week.  PA visit one month

## 2012-07-13 ENCOUNTER — Ambulatory Visit: Payer: PRIVATE HEALTH INSURANCE | Admitting: Physical Therapy

## 2012-07-18 ENCOUNTER — Ambulatory Visit
Payer: PRIVATE HEALTH INSURANCE | Attending: Physical Medicine and Rehabilitation | Admitting: Rehabilitative and Restorative Service Providers"

## 2012-08-01 ENCOUNTER — Encounter: Payer: Self-pay | Admitting: Physical Medicine and Rehabilitation

## 2012-08-01 ENCOUNTER — Encounter
Payer: PRIVATE HEALTH INSURANCE | Attending: Physical Medicine and Rehabilitation | Admitting: Physical Medicine and Rehabilitation

## 2012-08-01 VITALS — BP 97/61 | HR 101 | Resp 14 | Ht 63.5 in | Wt 134.8 lb

## 2012-08-01 DIAGNOSIS — M961 Postlaminectomy syndrome, not elsewhere classified: Secondary | ICD-10-CM

## 2012-08-01 DIAGNOSIS — IMO0001 Reserved for inherently not codable concepts without codable children: Secondary | ICD-10-CM

## 2012-08-01 DIAGNOSIS — G8929 Other chronic pain: Secondary | ICD-10-CM

## 2012-08-01 DIAGNOSIS — M542 Cervicalgia: Secondary | ICD-10-CM | POA: Insufficient documentation

## 2012-08-01 MED ORDER — MORPHINE SULFATE ER 30 MG PO TBCR: 30.0000 mg | EXTENDED_RELEASE_TABLET | Freq: Two times a day (BID) | ORAL | Status: DC

## 2012-08-01 NOTE — Progress Notes (Signed)
Subjective:    Patient ID: Cindy Pratt, female    DOB: 04-Jun-1966, 46 y.o.   MRN: [id number]  HPI The patient complains about chronic low back pain . The patient also complains about pain all over her body, and she feels very fatigued. She d/c her Savella, because of financial reasons. She also decreased her morphine from 30mg  tid to 30mg  bid. She reports, that she has gotten seperated from her husband recently, and might have to try to find a job in the future. The problem has been stable.    Pain Inventory Average Pain 8 Pain Right Now 8 My pain is constant, sharp, burning, dull, stabbing, tingling and aching  In the last 24 hours, has pain interfered with the following? General activity 8 Relation with others 8 Enjoyment of life 8 What TIME of day is your pain at its worst? all of the time Sleep (in general) Poor  Pain is worse with: walking, bending, sitting, standing and some activites Pain improves with: rest and medication Relief from Meds: was getting relief   Mobility walk without assistance  Function disabled: date disabled   Neuro/Psych weakness numbness tingling depression anxiety  Prior Studies Any changes since last visit?  no  Physicians involved in your care Any changes since last visit?  no   Family History  Problem Relation Age of Onset  . Hypertension Mother   . Hypertension Father   . Breast cancer Maternal Grandmother   . Liver cancer Maternal Grandmother   . Cancer Maternal Grandmother     breast cancer  . Ovarian cancer Cousin   . Cancer Cousin     1st c. maternal side/ cervical cancer  . Diabetes Maternal Grandfather     both sides  . Cancer Cousin     1st c maternal/ colon cancer   History   Social History  . Marital Status: Married    Spouse Name: N/A    Number of Children: 4  . Years of Education: N/A   Occupational History  . disabled    Social History Main Topics  . Smoking status: Former Smoker    Types:  Cigarettes    Quit date: 07/31/2007  . Smokeless tobacco: Never Used  . Alcohol Use: No  . Drug Use: No  . Sexually Active: Yes    Birth Control/ Protection: Surgical   Other Topics Concern  . None   Social History Narrative  . None   Past Surgical History  Procedure Date  . Back surgery 1997, 2003  . Abdominal hysterectomy   . Cystectomy   . Diagnostic laparoscopy    Past Medical History  Diagnosis Date  . Fibromyalgia   . Anxiety   . Depression   . Hyperlipidemia   . Asthma   . Degenerative joint disease   . Osteoarthritis   . Belchings   . Lumbar post-laminectomy syndrome   . Lumbosacral neuritis   . Disturbance of skin sensation   . Sciatica   . Chronic pain syndrome   . Other chronic postoperative pain    BP 97/61  Pulse 101  Resp 14  Ht 5' 3.5" (1.613 m)  Wt 134 lb 12.8 oz (61.145 kg)  BMI 23.50 kg/m2  SpO2 98%    Review of Systems  Musculoskeletal: Positive for myalgias.  Psychiatric/Behavioral: Positive for dysphoric mood. The patient is nervous/anxious.   All other systems reviewed and are negative.       Objective:   Physical Exam Constitutional: She is  oriented to person, place, and time. She appears well-developed and well-nourished.  HENT:  Head: Normocephalic.  Neck: Neck supple.  Musculoskeletal: She exhibits tenderness.  Neurological: She is alert and oriented to person, place, and time.  Skin: Skin is warm and dry.  Psychiatric: She has a normal mood and affect.  Symmetric normal motor tone is noted throughout. Normal muscle bulk. Muscle testing reveals 5/5 muscle strength of the upper extremity, and 5/5 of the lower extremity, except give away weakness on left iliopsoas 4-?5, and weakness in tibialis anterior 4-/5. Full range of motion in upper and lower extremities. ROM of spine is restricted. Fine motor movements are normal in both hands.  DTR in the upper and lower extremity are present and symmetric 2+. No clonus is noted.   Patient arises from chair without difficulty. Narrow based gait with normal arm swing bilateral , able to stand on heels and toes . Tandem walk is stable. No pronator drift. Rhomberg negative.  Head protruded, hyper extension of C-spine.         Assessment & Plan:  1. Lumbar postlaminectomy syndrome status post L5-S1 fusion. Last MRI approximately one year ago showing no significant adjacent level disease.her chronic pain is generally relieved with narcotics although the duration of effect is short. She reports that she has decreased her morphine sulfate 30 mg three times a day,to bid, and this still gives her sufficient pain controll.  2. fibromyalgia syndrome stable, although patient has d/c Savella .  3. neck pain, x-ray of her neck showed mild degenerative changes.Ordered physical therapy, with modalities for pain relief,she reports that she has not started yet, but is in the process of scheduling an appointment.   Advised patient to continue with her walking and exercising.   PA visit one month

## 2012-08-01 NOTE — Patient Instructions (Signed)
Continue with your exercise and walking program 

## 2012-09-01 ENCOUNTER — Encounter: Payer: Self-pay | Admitting: Physical Medicine and Rehabilitation

## 2012-09-01 ENCOUNTER — Encounter
Payer: PRIVATE HEALTH INSURANCE | Attending: Physical Medicine and Rehabilitation | Admitting: Physical Medicine and Rehabilitation

## 2012-09-01 VITALS — BP 101/62 | HR 71 | Resp 14 | Ht 63.5 in | Wt 137.2 lb

## 2012-09-01 DIAGNOSIS — M961 Postlaminectomy syndrome, not elsewhere classified: Secondary | ICD-10-CM | POA: Insufficient documentation

## 2012-09-01 DIAGNOSIS — IMO0001 Reserved for inherently not codable concepts without codable children: Secondary | ICD-10-CM | POA: Insufficient documentation

## 2012-09-01 DIAGNOSIS — M545 Low back pain, unspecified: Secondary | ICD-10-CM | POA: Insufficient documentation

## 2012-09-01 DIAGNOSIS — E785 Hyperlipidemia, unspecified: Secondary | ICD-10-CM | POA: Insufficient documentation

## 2012-09-01 DIAGNOSIS — Z981 Arthrodesis status: Secondary | ICD-10-CM | POA: Insufficient documentation

## 2012-09-01 DIAGNOSIS — M546 Pain in thoracic spine: Secondary | ICD-10-CM

## 2012-09-01 MED ORDER — MORPHINE SULFATE ER 15 MG PO TBCR: 15.0000 mg | EXTENDED_RELEASE_TABLET | Freq: Three times a day (TID) | ORAL | Status: DC

## 2012-09-01 NOTE — Progress Notes (Signed)
Subjective:    Patient ID: Cindy Pratt, female    DOB: December 02, 1965, 47 y.o.   MRN: [id number]  HPI The patient complains about chronic low back pain . The patient also complains about pain all over her body, and she feels very fatigued. She d/c her Savella, because of financial reasons. She also decreased her morphine from 30mg  tid to 30mg  bid, she is asking, whether she could decrease her morphine to 15mg  tid. She reports, that she has gotten seperated from her husband recently, and might have to try to find a job in the future.  She also complains about mid thoracic pain, which radiates around her ribs. Otherwise the problem has been stable.   Pain Inventory Average Pain 7 Pain Right Now 7 My pain is sharp, burning, dull, stabbing, tingling and aching  In the last 24 hours, has pain interfered with the following? General activity 7 Relation with others 7 Enjoyment of life 7 What TIME of day is your pain at its worst? morning and night Sleep (in general) Poor  Pain is worse with: laying in bed at night Pain improves with: medication and changing positions Relief from Meds: 10  Mobility walk without assistance ability to climb steps?  yes do you drive?  yes  Function I need assistance with the following:  household duties and shopping  Neuro/Psych depression anxiety  Prior Studies Any changes since last visit?  no  Physicians involved in your care Any changes since last visit?  no   Family History  Problem Relation Age of Onset  . Hypertension Mother   . Hypertension Father   . Breast cancer Maternal Grandmother   . Liver cancer Maternal Grandmother   . Cancer Maternal Grandmother     breast cancer  . Ovarian cancer Cousin   . Cancer Cousin     1st c. maternal side/ cervical cancer  . Diabetes Maternal Grandfather     both sides  . Cancer Cousin     1st c maternal/ colon cancer   History   Social History  . Marital Status: Married    Spouse Name: N/A      Number of Children: 4  . Years of Education: N/A   Occupational History  . disabled    Social History Main Topics  . Smoking status: Former Smoker    Types: Cigarettes    Quit date: 07/31/2007  . Smokeless tobacco: Never Used  . Alcohol Use: No  . Drug Use: No  . Sexually Active: Yes    Birth Control/ Protection: Surgical   Other Topics Concern  . None   Social History Narrative  . None   Past Surgical History  Procedure Date  . Back surgery 1997, 2003  . Abdominal hysterectomy   . Cystectomy   . Diagnostic laparoscopy    Past Medical History  Diagnosis Date  . Fibromyalgia   . Anxiety   . Depression   . Hyperlipidemia   . Asthma   . Degenerative joint disease   . Osteoarthritis   . Belchings   . Lumbar post-laminectomy syndrome   . Lumbosacral neuritis   . Disturbance of skin sensation   . Sciatica   . Chronic pain syndrome   . Other chronic postoperative pain    BP 101/62  Pulse 71  Resp 14  Ht 5' 3.5" (1.613 m)  Wt 137 lb 3.2 oz (62.234 kg)  BMI 23.92 kg/m2  SpO2 96%    Review of Systems  Musculoskeletal: Positive  for back pain.  Psychiatric/Behavioral: Positive for dysphoric mood. The patient is nervous/anxious.   All other systems reviewed and are negative.       Objective:   Physical Exam Constitutional: She is oriented to person, place, and time. She appears well-developed and well-nourished.  HENT:  Head: Normocephalic.  Neck: Neck supple.  Musculoskeletal: She exhibits tenderness mid thoracic spine, over costo-vertebral joints bilateral, at around T10.  Neurological: She is alert and oriented to person, place, and time.  Skin: Skin is warm and dry.  Psychiatric: She has a normal mood and affect.  Symmetric normal motor tone is noted throughout. Normal muscle bulk. Muscle testing reveals 5/5 muscle strength of the upper extremity, and 5/5 of the lower extremity, except give away weakness on left iliopsoas 4-?5, and weakness in  tibialis anterior 4-/5. Full range of motion in upper and lower extremities. ROM of spine is restricted. Fine motor movements are normal in both hands.  DTR in the upper and lower extremity are present and symmetric 2+. No clonus is noted.  Patient arises from chair without difficulty. Narrow based gait with normal arm swing bilateral , able to stand on heels and toes . Tandem walk is stable. No pronator drift. Rhomberg negative.  Head protruded, hyper extension of C-spine.         Assessment & Plan:  1. Lumbar postlaminectomy syndrome status post L5-S1 fusion. Last MRI approximately one year ago showing no significant adjacent level disease.her chronic pain is generally relieved with narcotics although the duration of effect is short. She reports that she has decreased her morphine sulfate 30 mg three times a day,to bid, and this still gives her sufficient pain controll.  2. fibromyalgia syndrome stable, although patient has d/c Savella .  3. neck pain, x-ray of her neck showed mild degenerative changes.Ordered physical therapy, with modalities for pain relief,she reports that she has not started yet, but is in the process of scheduling an appointment.  4. Thoracic pain, ordered x-ray of T-spine Prescribed Morphine, MS-Contin 15mg  tid, d/c morpine 30mg  bid Advised patient to continue with her walking and exercising.  PA visit one month

## 2012-09-01 NOTE — Patient Instructions (Signed)
Try to start your PT.

## 2012-09-11 ENCOUNTER — Telehealth: Payer: Self-pay

## 2012-09-11 NOTE — Telephone Encounter (Signed)
FYI-Patient called to let us know that she tried to lower her medication dose like discussed at last visit.  She said the 15mg  is not working and she has had to go back to taking 30mg .  This will cause her to be out of her medication early.  She realizes that she will have to take medication forever now.

## 2012-09-11 NOTE — Telephone Encounter (Signed)
ok 

## 2012-09-14 ENCOUNTER — Other Ambulatory Visit: Payer: Self-pay

## 2012-09-14 MED ORDER — ESOMEPRAZOLE MAGNESIUM 40 MG PO CPDR: 40.0000 mg | DELAYED_RELEASE_CAPSULE | Freq: Two times a day (BID) | ORAL | Status: DC

## 2012-09-18 ENCOUNTER — Telehealth: Payer: Self-pay | Admitting: *Deleted

## 2012-09-18 ENCOUNTER — Ambulatory Visit
Admission: RE | Admit: 2012-09-18 | Discharge: 2012-09-18 | Disposition: A | Discharge status: Home / Self Care | Payer: PRIVATE HEALTH INSURANCE | Source: Ambulatory Visit | Attending: Physical Medicine and Rehabilitation | Admitting: Physical Medicine and Rehabilitation

## 2012-09-18 ENCOUNTER — Other Ambulatory Visit: Payer: Self-pay | Admitting: Physical Medicine and Rehabilitation

## 2012-09-18 DIAGNOSIS — M546 Pain in thoracic spine: Secondary | ICD-10-CM

## 2012-09-18 NOTE — Telephone Encounter (Signed)
Left message on identifiable voicemail

## 2012-09-18 NOTE — Telephone Encounter (Signed)
Message copied by Sydnee Levans on Mon Sep 18, 2012  3:54 PM ------      Message from: Su Monks      Created: Mon Sep 18, 2012  3:16 PM       Please call patient, X-rays of thoracic did not show any significant findings

## 2012-09-26 ENCOUNTER — Telehealth: Payer: Self-pay

## 2012-09-26 MED ORDER — MORPHINE SULFATE 30 MG PO TABS: 30.0000 mg | ORAL_TABLET | Freq: Two times a day (BID) | ORAL | Status: DC

## 2012-09-26 NOTE — Telephone Encounter (Signed)
Please call her , when she is out we could go back to 30mg  bid, she was on that dose, before she wanted to go down further.

## 2012-09-26 NOTE — Telephone Encounter (Signed)
Called and spoke with patient. She will be out this Friday 09/29/12. Prescription printed for Cindy Pratt to sign.

## 2012-09-26 NOTE — Telephone Encounter (Signed)
Forwarded to Clydie Braun, Psychologist, clinical. PA

## 2012-09-26 NOTE — Telephone Encounter (Signed)
Patient called to say she cut back on her medication but it was to difficult so she went back to original dose.  This will cause her to be out early.  Please advise.

## 2012-09-26 NOTE — Telephone Encounter (Signed)
It was MS contin

## 2012-09-26 NOTE — Telephone Encounter (Signed)
Patient aware prescription is ready for pick up

## 2012-09-30 ENCOUNTER — Telehealth: Payer: Self-pay | Admitting: Physical Medicine & Rehabilitation

## 2012-09-30 NOTE — Telephone Encounter (Signed)
See phone message Bobby Rumpf at [facility] not to fill MS IR New Rx for MS Contin CR 30mg  BID #[id number]  AK MD

## 2012-10-02 ENCOUNTER — Encounter: Payer: PRIVATE HEALTH INSURANCE | Admitting: Physical Medicine and Rehabilitation

## 2012-10-06 ENCOUNTER — Encounter: Payer: Self-pay | Admitting: Physical Medicine and Rehabilitation

## 2012-10-06 ENCOUNTER — Encounter
Payer: PRIVATE HEALTH INSURANCE | Attending: Physical Medicine and Rehabilitation | Admitting: Physical Medicine and Rehabilitation

## 2012-10-06 VITALS — BP 111/56 | HR 70 | Resp 16 | Ht 63.5 in | Wt 137.0 lb

## 2012-10-06 DIAGNOSIS — M542 Cervicalgia: Secondary | ICD-10-CM | POA: Insufficient documentation

## 2012-10-06 DIAGNOSIS — M961 Postlaminectomy syndrome, not elsewhere classified: Secondary | ICD-10-CM | POA: Insufficient documentation

## 2012-10-06 DIAGNOSIS — IMO0001 Reserved for inherently not codable concepts without codable children: Secondary | ICD-10-CM | POA: Insufficient documentation

## 2012-10-06 DIAGNOSIS — G8929 Other chronic pain: Secondary | ICD-10-CM | POA: Insufficient documentation

## 2012-10-06 DIAGNOSIS — M545 Low back pain: Secondary | ICD-10-CM

## 2012-10-06 DIAGNOSIS — Z981 Arthrodesis status: Secondary | ICD-10-CM | POA: Insufficient documentation

## 2012-10-06 MED ORDER — MORPHINE SULFATE ER 30 MG PO TBCR
30.0000 mg | EXTENDED_RELEASE_TABLET | Freq: Two times a day (BID) | ORAL | Status: DC
Start: 2012-10-06 — End: 2012-11-24

## 2012-10-06 NOTE — Progress Notes (Signed)
Subjective:    Patient ID: Cindy Pratt, female    DOB: May 01, 1966, 47 y.o.   MRN: [id number]  HPI The patient complains about chronic low back pain . The patient also complains about pain all over her body, and she feels very fatigued. She d/c her Savella, because of financial reasons. She also decreased her morphine from 30mg  tid to 30mg  bid. She has tried to decrease her morphine to 15mg  tid, but this did not work for her, she is back on the 30mg  bid. She reports, that she has gotten seperated from her husband recently, and might have to try to find a job in the future.   The problem has been stable.   Pain Inventory Average Pain 8 Pain Right Now 8 My pain is constant, sharp, burning, dull, stabbing, tingling and aching  In the last 24 hours, has pain interfered with the following? General activity 8 Relation with others 8 Enjoyment of life 8 What TIME of day is your pain at its worst? All Day Sleep (in general) Poor  Pain is worse with: unsure Pain improves with: nothing Relief from Meds: 7-8  Mobility walk without assistance  Function Do you have any goals in this area?  no  Neuro/Psych weakness numbness depression anxiety  Prior Studies Any changes since last visit?  yes x-rays  Physicians involved in your care Primary care Dr. Tomi Bamberger   Family History  Problem Relation Age of Onset  . Hypertension Mother   . Hypertension Father   . Breast cancer Maternal Grandmother   . Liver cancer Maternal Grandmother   . Cancer Maternal Grandmother     breast cancer  . Ovarian cancer Cousin   . Cancer Cousin     1st c. maternal side/ cervical cancer  . Diabetes Maternal Grandfather     both sides  . Cancer Cousin     1st c maternal/ colon cancer   History   Social History  . Marital Status: Married    Spouse Name: N/A    Number of Children: 4  . Years of Education: N/A   Occupational History  . disabled    Social History Main Topics  . Smoking  status: Former Smoker -- 1.0 packs/day for 20 years    Types: Cigarettes    Quit date: 07/31/2007  . Smokeless tobacco: Never Used  . Alcohol Use: No  . Drug Use: No  . Sexually Active: Yes    Birth Control/ Protection: Surgical   Other Topics Concern  . None   Social History Narrative  . None   Past Surgical History  Procedure Date  . Back surgery 1997, 2003  . Abdominal hysterectomy   . Cystectomy   . Diagnostic laparoscopy    Past Medical History  Diagnosis Date  . Fibromyalgia   . Anxiety   . Depression   . Hyperlipidemia   . Asthma   . Degenerative joint disease   . Osteoarthritis   . Belchings   . Lumbar post-laminectomy syndrome   . Lumbosacral neuritis   . Disturbance of skin sensation   . Sciatica   . Chronic pain syndrome   . Other chronic postoperative pain    BP 111/56  Pulse 70  Resp 16  Ht 5' 3.5" (1.613 m)  Wt 137 lb (62.143 kg)  BMI 23.89 kg/m2  SpO2 99%      Review of Systems  HENT: Negative.   Eyes: Negative.   Respiratory: Negative.   Cardiovascular: Negative.  Gastrointestinal: Negative.   Genitourinary: Negative.   Musculoskeletal: Negative.   Skin: Negative.   Neurological: Positive for weakness and numbness.  Hematological: Negative.   Psychiatric/Behavioral: Positive for dysphoric mood. The patient is nervous/anxious.        Objective:   Physical Exam Constitutional: She is oriented to person, place, and time. She appears well-developed and well-nourished.  HENT:  Head: Normocephalic.  Neck: Neck supple.  Musculoskeletal: She exhibits tenderness mid thoracic spine, over costo-vertebral joints bilateral, at around T10.  Neurological: She is alert and oriented to person, place, and time.  Skin: Skin is warm and dry.  Psychiatric: She has a normal mood and affect.  Symmetric normal motor tone is noted throughout. Normal muscle bulk. Muscle testing reveals 5/5 muscle strength of the upper extremity, and 5/5 of the lower  extremity, except give away weakness on left iliopsoas 4-?5, and weakness in tibialis anterior 4-/5. Full range of motion in upper and lower extremities. ROM of spine is restricted. Fine motor movements are normal in both hands.  DTR in the upper and lower extremity are present and symmetric 2+. No clonus is noted.  Patient arises from chair without difficulty. Narrow based gait with normal arm swing bilateral , able to stand on heels and toes . Tandem walk is stable. No pronator drift. Rhomberg negative.  Head protruded, hyper extension of C-spine.         Assessment & Plan:  1. Lumbar postlaminectomy syndrome status post L5-S1 fusion. Last MRI approximately one year ago showing no significant adjacent level disease.her chronic pain is generally relieved with narcotics although the duration of effect is short. She reports that she has decreased her morphine sulfate 30 mg three times a day,to bid, and this still gives her sufficient pain controll.  2. fibromyalgia syndrome stable, although patient has d/c Savella .  3. neck pain, x-ray of her neck showed mild degenerative changes.Ordered physical therapy, with modalities for pain relief,she reports that she has not started yet, but is in the process of scheduling an appointment.  4. Thoracic pain, ordered x-ray of T-spine  Prescribed Morphine, MS-Contin, 30mg  bid , to be filled when due Advised patient to continue with her walking and exercising.  PA visit 7 weeks

## 2012-10-06 NOTE — Patient Instructions (Signed)
Stay as active as pain permits 

## 2012-10-14 ENCOUNTER — Other Ambulatory Visit: Payer: Self-pay

## 2012-11-24 ENCOUNTER — Encounter
Payer: PRIVATE HEALTH INSURANCE | Attending: Physical Medicine and Rehabilitation | Admitting: Physical Medicine and Rehabilitation

## 2012-11-24 ENCOUNTER — Encounter: Payer: Self-pay | Admitting: Physical Medicine and Rehabilitation

## 2012-11-24 VITALS — BP 118/70 | HR 64 | Resp 14 | Ht 63.0 in | Wt 137.0 lb

## 2012-11-24 DIAGNOSIS — M546 Pain in thoracic spine: Secondary | ICD-10-CM | POA: Insufficient documentation

## 2012-11-24 DIAGNOSIS — Z5181 Encounter for therapeutic drug level monitoring: Secondary | ICD-10-CM

## 2012-11-24 DIAGNOSIS — M961 Postlaminectomy syndrome, not elsewhere classified: Secondary | ICD-10-CM | POA: Insufficient documentation

## 2012-11-24 DIAGNOSIS — Z79899 Other long term (current) drug therapy: Secondary | ICD-10-CM

## 2012-11-24 DIAGNOSIS — G894 Chronic pain syndrome: Secondary | ICD-10-CM

## 2012-11-24 DIAGNOSIS — IMO0001 Reserved for inherently not codable concepts without codable children: Secondary | ICD-10-CM | POA: Insufficient documentation

## 2012-11-24 DIAGNOSIS — Z981 Arthrodesis status: Secondary | ICD-10-CM | POA: Insufficient documentation

## 2012-11-24 MED ORDER — TIZANIDINE HCL 4 MG PO TABS: 4.0000 mg | ORAL_TABLET | Freq: Two times a day (BID) | ORAL | Status: DC

## 2012-11-24 MED ORDER — MORPHINE SULFATE ER 30 MG PO TBCR: 30.0000 mg | EXTENDED_RELEASE_TABLET | Freq: Two times a day (BID) | ORAL | Status: DC

## 2012-11-24 NOTE — Addendum Note (Signed)
Addended by: Judd Gaudier on: 11/24/2012 11:49 AM   Modules accepted: Orders

## 2012-11-24 NOTE — Patient Instructions (Signed)
Continue with your walking and exercise program.

## 2012-11-24 NOTE — Progress Notes (Signed)
Subjective:    Patient ID: Cindy Pratt, female    DOB: 20-Jul-1966, 47 y.o.   MRN: [id number]  HPI The patient complains about chronic low back pain . The patient also complains about pain all over her body, and she feels very fatigued. She d/c her Savella, because of financial reasons. She also decreased her morphine from 30mg  tid to 30mg  bid. She has tried to decrease her morphine to 15mg  tid, but this did not work for her, she is back on the 30mg  bid. She reports, that she has gotten seperated from her husband recently, and might have to try to find a job in the future.  The problem has been stable.   Pain Inventory Average Pain 7 Pain Right Now 7 My pain is intermittent, sharp, burning, dull, stabbing, tingling and aching  In the last 24 hours, has pain interfered with the following? General activity 7 Relation with others 7 Enjoyment of life 7 What TIME of day is your pain at its worst? morning and night Sleep (in general) Fair  Pain is worse with: walking, bending, sitting, inactivity, standing and some activites Pain improves with: n/a Relief from Meds: 7  Mobility ability to climb steps?  yes do you drive?  yes Do you have any goals in this area?  yes  Function Do you have any goals in this area?  no  Neuro/Psych weakness numbness tingling depression anxiety  Prior Studies Any changes since last visit?  no  Physicians involved in your care Any changes since last visit?  no   Family History  Problem Relation Age of Onset  . Hypertension Mother   . Hypertension Father   . Breast cancer Maternal Grandmother   . Liver cancer Maternal Grandmother   . Cancer Maternal Grandmother     breast cancer  . Ovarian cancer Cousin   . Cancer Cousin     1st c. maternal side/ cervical cancer  . Diabetes Maternal Grandfather     both sides  . Cancer Cousin     1st c maternal/ colon cancer   History   Social History  . Marital Status: Married    Spouse Name:  N/A    Number of Children: 4  . Years of Education: N/A   Occupational History  . disabled    Social History Main Topics  . Smoking status: Former Smoker -- 1.00 packs/day for 20 years    Types: Cigarettes    Quit date: 07/31/2007  . Smokeless tobacco: Never Used  . Alcohol Use: No  . Drug Use: No  . Sexually Active: Yes    Birth Control/ Protection: Surgical   Other Topics Concern  . None   Social History Narrative  . None   Past Surgical History  Procedure Laterality Date  . Back surgery  1997, 2003  . Abdominal hysterectomy    . Cystectomy    . Diagnostic laparoscopy     Past Medical History  Diagnosis Date  . Fibromyalgia   . Anxiety   . Depression   . Hyperlipidemia   . Asthma   . Degenerative joint disease   . Osteoarthritis   . Belchings   . Lumbar post-laminectomy syndrome   . Lumbosacral neuritis   . Disturbance of skin sensation   . Sciatica   . Chronic pain syndrome   . Other chronic postoperative pain    BP 118/70  Pulse 64  Resp 14  Ht 5\' 3"  (1.6 m)  Wt 137 lb (62.143  kg)  BMI 24.27 kg/m2  SpO2 99%     Review of Systems  Neurological: Positive for weakness and numbness.  Psychiatric/Behavioral: Positive for dysphoric mood. The patient is nervous/anxious.   All other systems reviewed and are negative.       Objective:   Physical Exam Constitutional: She is oriented to person, place, and time. She appears well-developed and well-nourished.  HENT:  Head: Normocephalic.  Neck: Neck supple.  Musculoskeletal: She exhibits tenderness mid thoracic spine, over costo-vertebral joints bilateral, at around T10.  Neurological: She is alert and oriented to person, place, and time.  Skin: Skin is warm and dry.  Psychiatric: She has a normal mood and affect.  Symmetric normal motor tone is noted throughout. Normal muscle bulk. Muscle testing reveals 5/5 muscle strength of the upper extremity, and 5/5 of the lower extremity, except give away  weakness on left iliopsoas 4-?5, and weakness in tibialis anterior 4-/5. Full range of motion in upper and lower extremities. ROM of spine is restricted. Fine motor movements are normal in both hands.  DTR in the upper and lower extremity are present and symmetric 2+. No clonus is noted.  Patient arises from chair without difficulty. Narrow based gait with normal arm swing bilateral , able to stand on heels and toes . Tandem walk is stable. No pronator drift. Rhomberg negative.  Head protruded, hyper extension of C-spine.         Assessment & Plan:  1. Lumbar postlaminectomy syndrome status post L5-S1 fusion. Last MRI approximately one year ago showing no significant adjacent level disease.. She reports that she has decreased her morphine sulfate 30 mg three times a day,to bid, and this still gives her sufficient pain controll.  2. fibromyalgia syndrome stable, although patient has d/c Savella .  3. neck pain, x-ray of her neck showed mild degenerative changes.Ordered physical therapy, with modalities for pain relief,she reports that she has not started yet, but is in the process of scheduling an appointment.  4. Thoracic pain, ordered x-ray of T-spine, which did not show any significant findings.   Refilled Morphine, MS-Contin, 30mg  bid , and Tizanidine 4mg , bid.  Advised patient to continue with her walking and exercising.  PA visit in 1 month

## 2012-12-22 ENCOUNTER — Encounter
Payer: PRIVATE HEALTH INSURANCE | Attending: Physical Medicine and Rehabilitation | Admitting: Physical Medicine and Rehabilitation

## 2012-12-22 ENCOUNTER — Encounter: Payer: Self-pay | Admitting: Physical Medicine and Rehabilitation

## 2012-12-22 VITALS — BP 103/67 | HR 63 | Resp 14 | Ht 63.5 in | Wt 137.0 lb

## 2012-12-22 DIAGNOSIS — M542 Cervicalgia: Secondary | ICD-10-CM | POA: Insufficient documentation

## 2012-12-22 DIAGNOSIS — M961 Postlaminectomy syndrome, not elsewhere classified: Secondary | ICD-10-CM | POA: Insufficient documentation

## 2012-12-22 DIAGNOSIS — Z981 Arthrodesis status: Secondary | ICD-10-CM | POA: Insufficient documentation

## 2012-12-22 DIAGNOSIS — IMO0001 Reserved for inherently not codable concepts without codable children: Secondary | ICD-10-CM | POA: Insufficient documentation

## 2012-12-22 DIAGNOSIS — G8929 Other chronic pain: Secondary | ICD-10-CM | POA: Insufficient documentation

## 2012-12-22 MED ORDER — MORPHINE SULFATE ER 30 MG PO TBCR: 30.0000 mg | EXTENDED_RELEASE_TABLET | Freq: Two times a day (BID) | ORAL | Status: DC

## 2012-12-22 NOTE — Patient Instructions (Signed)
Continue with your walking program. 

## 2012-12-22 NOTE — Progress Notes (Signed)
Subjective:    Patient ID: Cindy Pratt, female    DOB: [birth year], 47 y.o.   MRN: [id number]  HPI The patient complains about chronic low back pain . The patient also complains about pain all over her body, and she feels very fatigued. She d/c her Savella, because of financial reasons. She also decreased her morphine from 30mg  tid to 30mg  bid. She has tried to decrease her morphine to 15mg  tid, but this did not work for her, she is back on the 30mg  bid. She reports, that she has gotten back together with her husband recently .  The problem has been stable.   Pain Inventory Average Pain 5 Pain Right Now 5 My pain is constant, sharp, burning, dull, stabbing, tingling and aching  In the last 24 hours, has pain interfered with the following? General activity 5 Relation with others 5 Enjoyment of life 5 What TIME of day is your pain at its worst? morning and evening Sleep (in general) Fair  Pain is worse with: bending, sitting, standing and some activites Pain improves with: therapy/exercise and medication Relief from Meds: 5  Mobility Do you have any goals in this area?  no  Function Do you have any goals in this area?  no  Neuro/Psych No problems in this area  Prior Studies Any changes since last visit?  no  Physicians involved in your care Any changes since last visit?  no   Family History  Problem Relation Age of Onset  . Hypertension Mother   . Hypertension Father   . Breast cancer Maternal Grandmother   . Liver cancer Maternal Grandmother   . Cancer Maternal Grandmother     breast cancer  . Ovarian cancer Cousin   . Cancer Cousin     1st c. maternal side/ cervical cancer  . Diabetes Maternal Grandfather     both sides  . Cancer Cousin     1st c maternal/ colon cancer   History   Social History  . Marital Status: Married    Spouse Name: N/A    Number of Children: 4  . Years of Education: N/A   Occupational History  . disabled    Social History  Main Topics  . Smoking status: Former Smoker -- 1.00 packs/day for 20 years    Types: Cigarettes    Quit date: 07/31/2007  . Smokeless tobacco: Never Used  . Alcohol Use: No  . Drug Use: No  . Sexually Active: Yes    Birth Control/ Protection: Surgical   Other Topics Concern  . None   Social History Narrative  . None   Past Surgical History  Procedure Laterality Date  . Back surgery  1997, 2003  . Abdominal hysterectomy    . Cystectomy    . Diagnostic laparoscopy     Past Medical History  Diagnosis Date  . Fibromyalgia   . Anxiety   . Depression   . Hyperlipidemia   . Asthma   . Degenerative joint disease   . Osteoarthritis   . Belchings   . Lumbar post-laminectomy syndrome   . Lumbosacral neuritis   . Disturbance of skin sensation   . Sciatica   . Chronic pain syndrome   . Other chronic postoperative pain    BP 103/67  Pulse 63  Resp 14  Ht 5' 3.5" (1.613 m)  Wt 137 lb (62.143 kg)  BMI 23.88 kg/m2  SpO2 96%     Review of Systems  All other systems reviewed and  are negative.       Objective:   Physical Exam Constitutional: She is oriented to person, place, and time. She appears well-developed and well-nourished.  HENT:  Head: Normocephalic.  Neck: Neck supple.  Musculoskeletal: She exhibits tenderness .  Neurological: She is alert and oriented to person, place, and time.  Skin: Skin is warm and dry.  Psychiatric: She has a normal mood and affect.  Symmetric normal motor tone is noted throughout. Normal muscle bulk. Muscle testing reveals 5/5 muscle strength of the upper extremity, and 5/5 of the lower extremity, except give away weakness on left iliopsoas 4-/5, and weakness in tibialis anterior 4-/5. Full range of motion in upper and lower extremities. ROM of spine is restricted. Fine motor movements are normal in both hands.  DTR in the upper and lower extremity are present and symmetric 2+. No clonus is noted.  Patient arises from chair without  difficulty. Narrow based gait with normal arm swing bilateral , able to stand on heels and toes . Tandem walk is stable. No pronator drift. Rhomberg negative.  Head protruded, hyper extension of C-spine.        Assessment & Plan:  1. Lumbar postlaminectomy syndrome status post L5-S1 fusion. Last MRI approximately one year ago showing no significant adjacent level disease.. She reports that she has decreased her morphine sulfate 30 mg three times a day,to bid, and this still gives her sufficient pain controll.  2. fibromyalgia syndrome stable, although patient has d/c Savella .  3. neck pain, x-ray of her neck showed mild degenerative changes.Ordered physical therapy, with modalities for pain relief,she reports that she has not started .  4. Thoracic pain, ordered x-ray of T-spine, which did not show any significant findings.  Refilled Morphine, MS-Contin, 30mg  bid , and Tizanidine 4mg , bid.  Advised patient to continue with her walking and exercising.  PA visit in 1 month

## 2013-01-23 ENCOUNTER — Encounter
Payer: PRIVATE HEALTH INSURANCE | Attending: Physical Medicine and Rehabilitation | Admitting: Physical Medicine and Rehabilitation

## 2013-01-23 ENCOUNTER — Encounter: Payer: Self-pay | Admitting: Physical Medicine and Rehabilitation

## 2013-01-23 VITALS — HR 64 | Resp 14 | Ht 63.5 in | Wt 141.2 lb

## 2013-01-23 DIAGNOSIS — M546 Pain in thoracic spine: Secondary | ICD-10-CM | POA: Insufficient documentation

## 2013-01-23 DIAGNOSIS — M545 Low back pain, unspecified: Secondary | ICD-10-CM | POA: Insufficient documentation

## 2013-01-23 DIAGNOSIS — Z981 Arthrodesis status: Secondary | ICD-10-CM | POA: Insufficient documentation

## 2013-01-23 DIAGNOSIS — M961 Postlaminectomy syndrome, not elsewhere classified: Secondary | ICD-10-CM

## 2013-01-23 DIAGNOSIS — M542 Cervicalgia: Secondary | ICD-10-CM | POA: Insufficient documentation

## 2013-01-23 DIAGNOSIS — IMO0001 Reserved for inherently not codable concepts without codable children: Secondary | ICD-10-CM | POA: Insufficient documentation

## 2013-01-23 DIAGNOSIS — R52 Pain, unspecified: Secondary | ICD-10-CM | POA: Insufficient documentation

## 2013-01-23 MED ORDER — MORPHINE SULFATE ER 30 MG PO TBCR: 30.0000 mg | EXTENDED_RELEASE_TABLET | Freq: Two times a day (BID) | ORAL | Status: DC

## 2013-01-23 NOTE — Progress Notes (Signed)
Subjective:    Patient ID: Cindy Pratt, female    DOB: [birth year], 47 y.o.   MRN: [id number]  HPI The patient complains about chronic low back pain . The patient also complains about pain all over her body, and she feels very fatigued. She d/c her Savella, because of financial reasons. She also decreased her morphine from 30mg  tid to 30mg  bid. She has tried to decrease her morphine to 15mg  tid, but this did not work for her, she is back on the 30mg  bid. She reports, that she has gotten back together with her husband  . She states, that she is still doing her walking program, and works some in her garden as pain permits. The problem has been stable.   Pain Inventory Average Pain 6 Pain Right Now 6 My pain is sharp, burning, dull, stabbing, tingling and aching  In the last 24 hours, has pain interfered with the following? General activity 0 Relation with others 0 Enjoyment of life 0 What TIME of day is your pain at its worst? morning and night Sleep (in general) Poor  Pain is worse with: walking, bending, sitting, standing and some activites Pain improves with: rest and medication Relief from Meds: 7  Mobility walk without assistance how many minutes can you walk? 30 ability to climb steps?  yes do you drive?  yes  Function disabled: date disabled . I need assistance with the following:  meal prep, household duties and shopping  Neuro/Psych weakness numbness tingling depression anxiety  Prior Studies Any changes since last visit?  no  Physicians involved in your care Any changes since last visit?  no   Family History  Problem Relation Age of Onset  . Hypertension Mother   . Hypertension Father   . Breast cancer Maternal Grandmother   . Liver cancer Maternal Grandmother   . Cancer Maternal Grandmother     breast cancer  . Ovarian cancer Cousin   . Cancer Cousin     1st c. maternal side/ cervical cancer  . Diabetes Maternal Grandfather     both sides  .  Cancer Cousin     1st c maternal/ colon cancer   History   Social History  . Marital Status: Married    Spouse Name: N/A    Number of Children: 4  . Years of Education: N/A   Occupational History  . disabled    Social History Main Topics  . Smoking status: Former Smoker -- 1.00 packs/day for 20 years    Types: Cigarettes    Quit date: 07/31/2007  . Smokeless tobacco: Never Used  . Alcohol Use: No  . Drug Use: No  . Sexually Active: Yes    Birth Control/ Protection: Surgical   Other Topics Concern  . None   Social History Narrative  . None   Past Surgical History  Procedure Laterality Date  . Back surgery  1997, 2003  . Abdominal hysterectomy    . Cystectomy    . Diagnostic laparoscopy     Past Medical History  Diagnosis Date  . Fibromyalgia   . Anxiety   . Depression   . Hyperlipidemia   . Asthma   . Degenerative joint disease   . Osteoarthritis   . Belchings   . Lumbar post-laminectomy syndrome   . Lumbosacral neuritis   . Disturbance of skin sensation   . Sciatica   . Chronic pain syndrome   . Other chronic postoperative pain    Pulse 64  Resp  14  Ht 5' 3.5" (1.613 m)  Wt 141 lb 3.2 oz (64.048 kg)  BMI 24.62 kg/m2  SpO2 97%    Review of Systems  Musculoskeletal: Positive for back pain and gait problem.  Neurological: Positive for weakness and numbness.       Tingling  Psychiatric/Behavioral: Positive for dysphoric mood. The patient is nervous/anxious.   All other systems reviewed and are negative.       Objective:   Physical Exam Constitutional: She is oriented to person, place, and time. She appears well-developed and well-nourished.  HENT:  Head: Normocephalic.  Neck: Neck supple.  Musculoskeletal: She exhibits tenderness .  Neurological: She is alert and oriented to person, place, and time.  Skin: Skin is warm and dry.  Psychiatric: She has a normal mood and affect.  Symmetric normal motor tone is noted throughout. Normal muscle  bulk. Muscle testing reveals 5/5 muscle strength of the upper extremity, and 5/5 of the lower extremity, except give away weakness on left iliopsoas 4-/5, and weakness in tibialis anterior 4-/5. Full range of motion in upper and lower extremities. ROM of spine is restricted. Fine motor movements are normal in both hands.  DTR in the upper and lower extremity are present and symmetric 2+. No clonus is noted.  Patient arises from chair without difficulty. Narrow based gait with normal arm swing bilateral , able to stand on heels and toes . Tandem walk is stable. No pronator drift. Rhomberg negative.  Head protruded, hyper extension of C-spine.        Assessment & Plan:  1. Lumbar postlaminectomy syndrome status post L5-S1 fusion. Last MRI approximately one year ago showing no significant adjacent level disease.. She reports that she has decreased her morphine sulfate 30 mg three times a day,to bid, and this still gives her sufficient pain controll.  2. fibromyalgia syndrome stable, although patient has d/c Savella .  3. neck pain, x-ray of her neck showed mild degenerative changes.Ordered physical therapy, with modalities for pain relief,she reports that she has not started .  4. Thoracic pain, ordered x-ray of T-spine, which did not show any significant findings.  Refilled Morphine, MS-Contin, 30mg  bid , and Tizanidine 4mg , bid.  Advised patient to continue with her walking and exercising.  PA visit in 1 month

## 2013-01-23 NOTE — Patient Instructions (Signed)
Continue with your exercise and walking program 

## 2013-02-22 ENCOUNTER — Encounter: Payer: Self-pay | Admitting: Physical Medicine and Rehabilitation

## 2013-02-22 ENCOUNTER — Encounter
Payer: PRIVATE HEALTH INSURANCE | Attending: Physical Medicine and Rehabilitation | Admitting: Physical Medicine and Rehabilitation

## 2013-02-22 VITALS — BP 105/59 | HR 86 | Resp 16 | Ht 63.0 in | Wt 143.0 lb

## 2013-02-22 DIAGNOSIS — Z981 Arthrodesis status: Secondary | ICD-10-CM | POA: Insufficient documentation

## 2013-02-22 DIAGNOSIS — IMO0001 Reserved for inherently not codable concepts without codable children: Secondary | ICD-10-CM

## 2013-02-22 DIAGNOSIS — M545 Low back pain, unspecified: Secondary | ICD-10-CM | POA: Insufficient documentation

## 2013-02-22 DIAGNOSIS — M961 Postlaminectomy syndrome, not elsewhere classified: Secondary | ICD-10-CM | POA: Insufficient documentation

## 2013-02-22 DIAGNOSIS — E785 Hyperlipidemia, unspecified: Secondary | ICD-10-CM | POA: Insufficient documentation

## 2013-02-22 DIAGNOSIS — M79605 Pain in left leg: Secondary | ICD-10-CM

## 2013-02-22 DIAGNOSIS — R52 Pain, unspecified: Secondary | ICD-10-CM | POA: Insufficient documentation

## 2013-02-22 DIAGNOSIS — M546 Pain in thoracic spine: Secondary | ICD-10-CM | POA: Insufficient documentation

## 2013-02-22 DIAGNOSIS — M542 Cervicalgia: Secondary | ICD-10-CM | POA: Insufficient documentation

## 2013-02-22 DIAGNOSIS — Z886 Allergy status to analgesic agent status: Secondary | ICD-10-CM | POA: Insufficient documentation

## 2013-02-22 MED ORDER — METHYLPREDNISOLONE 4 MG PO KIT: PACK | ORAL | Status: DC

## 2013-02-22 MED ORDER — MORPHINE SULFATE ER 30 MG PO TBCR
30.0000 mg | EXTENDED_RELEASE_TABLET | Freq: Two times a day (BID) | ORAL | Status: DC
Start: 2013-02-22 — End: 2013-03-23

## 2013-02-22 NOTE — Patient Instructions (Signed)
Continue with your exercise and walking program 

## 2013-02-22 NOTE — Progress Notes (Signed)
Subjective:    Patient ID: Cindy Pratt, female    DOB: 1966/06/29, 47 y.o.   MRN: [id number]  HPI The patient complains about chronic low back pain . The patient also complains about pain all over her body, and she feels very fatigued. She d/c her Savella, because of financial reasons. She also decreased her morphine from 30mg  tid to 30mg  bid. She has tried to decrease her morphine to 15mg  tid, but this did not work for her, she is back on the 30mg  bid. She reports, that she has gotten back together with her husband . She states, that she is still doing her walking program, and works some in her garden as pain permits.  The problem has gotten worse. The patient complains about increased LBP, intermittendly radiating into her LLE posteriorly, this started 2 weeks ago and has gotten progressively worse. been stable.   Pain Inventory Average Pain 9 Pain Right Now 8 My pain is sharp, burning and stabbing  In the last 24 hours, has pain interfered with the following? General activity 9 Relation with others 9 Enjoyment of life 9 What TIME of day is your pain at its worst? constant Sleep (in general) NA  Pain is worse with: na Pain improves with: medication Relief from Meds: na  Mobility Do you have any goals in this area?  no  Function Do you have any goals in this area?  no  Neuro/Psych numbness tremor tingling confusion depression anxiety  Prior Studies Any changes since last visit?  no  Physicians involved in your care Any changes since last visit?  no   Family History  Problem Relation Age of Onset  . Hypertension Mother   . Hypertension Father   . Breast cancer Maternal Grandmother   . Liver cancer Maternal Grandmother   . Cancer Maternal Grandmother     breast cancer  . Ovarian cancer Cousin   . Cancer Cousin     1st c. maternal side/ cervical cancer  . Diabetes Maternal Grandfather     both sides  . Cancer Cousin     1st c maternal/ colon cancer    History   Social History  . Marital Status: Married    Spouse Name: N/A    Number of Children: 4  . Years of Education: N/A   Occupational History  . disabled    Social History Main Topics  . Smoking status: Former Smoker -- 1.00 packs/day for 20 years    Types: Cigarettes    Quit date: 07/31/2007  . Smokeless tobacco: Never Used  . Alcohol Use: No  . Drug Use: No  . Sexually Active: Yes    Birth Control/ Protection: Surgical   Other Topics Concern  . None   Social History Narrative  . None   Past Surgical History  Procedure Laterality Date  . Back surgery  1997, 2003  . Abdominal hysterectomy    . Cystectomy    . Diagnostic laparoscopy     Past Medical History  Diagnosis Date  . Fibromyalgia   . Anxiety   . Depression   . Hyperlipidemia   . Asthma   . Degenerative joint disease   . Osteoarthritis   . Belchings   . Lumbar post-laminectomy syndrome   . Lumbosacral neuritis   . Disturbance of skin sensation   . Sciatica   . Chronic pain syndrome   . Other chronic postoperative pain    BP 105/59  Pulse 86  Resp 16  Ht  5\' 3"  (1.6 m)  Wt 143 lb (64.864 kg)  BMI 25.34 kg/m2  SpO2 97%      Review of Systems  Neurological: Positive for tremors and numbness.       Tingling  Psychiatric/Behavioral: Positive for confusion, dysphoric mood and agitation.  All other systems reviewed and are negative.       Objective:   Physical Exam Constitutional: She is oriented to person, place, and time. She appears well-developed and well-nourished.  HENT:  Head: Normocephalic.  Neck: Neck supple.  Musculoskeletal: She exhibits tenderness .  Neurological: She is alert and oriented to person, place, and time.  Skin: Skin is warm and dry.  Psychiatric: She has a normal mood and affect.  Symmetric normal motor tone is noted throughout. Normal muscle bulk. Muscle testing reveals 5/5 muscle strength of the upper extremity, and 5/5 of the lower extremity, except  give away weakness on left iliopsoas 4-/5, and weakness in tibialis anterior 4-/5. Full range of motion in upper and lower extremities. ROM of spine is restricted. Fine motor movements are normal in both hands.  DTR in the upper and lower extremity are present and symmetric 2+. No clonus is noted.  Patient arises from chair without difficulty. Narrow based gait with normal arm swing bilateral , able to stand on heels and toes . Tandem walk is stable. No pronator drift. Rhomberg negative.  Head protruded, hyper extension of C-spine.        Assessment & Plan:  1. Lumbar postlaminectomy syndrome status post L5-S1 fusion.Increased pain with radiating into her LLE in a S1 distribution intermittendly. Ordered PT for pain relief, and possible McKenzie therapy, and I ordered a steroid dose pack, the patient has an allergy against NSAIDs.Consider MRI if it does not get better.  Last MRI approximately one year ago showing no significant adjacent level disease.. She reports that she has decreased her morphine sulfate 30 mg three times a day,to bid, and this still gives her sufficient pain controll.  2. fibromyalgia syndrome stable, although patient has d/c Savella .  3. neck pain, x-ray of her neck showed mild degenerative changes. 4. Thoracic pain, ordered x-ray of T-spine, which did not show any significant findings.  Refilled Morphine, MS-Contin, 30mg  bid .  Advised patient to continue with her walking and exercising as pain permits.  PA visit in 1 month

## 2013-03-20 ENCOUNTER — Encounter: Payer: PRIVATE HEALTH INSURANCE | Admitting: Physical Medicine and Rehabilitation

## 2013-03-21 ENCOUNTER — Encounter: Payer: Self-pay | Admitting: Physical Medicine and Rehabilitation

## 2013-03-23 ENCOUNTER — Encounter: Payer: Self-pay | Admitting: Physical Medicine and Rehabilitation

## 2013-03-23 ENCOUNTER — Encounter
Payer: PRIVATE HEALTH INSURANCE | Attending: Physical Medicine and Rehabilitation | Admitting: Physical Medicine and Rehabilitation

## 2013-03-23 VITALS — BP 106/70 | HR 60 | Resp 16 | Ht 63.0 in | Wt 146.0 lb

## 2013-03-23 DIAGNOSIS — Z981 Arthrodesis status: Secondary | ICD-10-CM | POA: Insufficient documentation

## 2013-03-23 DIAGNOSIS — M542 Cervicalgia: Secondary | ICD-10-CM | POA: Insufficient documentation

## 2013-03-23 DIAGNOSIS — M545 Low back pain, unspecified: Secondary | ICD-10-CM | POA: Insufficient documentation

## 2013-03-23 DIAGNOSIS — M546 Pain in thoracic spine: Secondary | ICD-10-CM | POA: Insufficient documentation

## 2013-03-23 DIAGNOSIS — IMO0001 Reserved for inherently not codable concepts without codable children: Secondary | ICD-10-CM | POA: Insufficient documentation

## 2013-03-23 DIAGNOSIS — E785 Hyperlipidemia, unspecified: Secondary | ICD-10-CM | POA: Insufficient documentation

## 2013-03-23 DIAGNOSIS — M961 Postlaminectomy syndrome, not elsewhere classified: Secondary | ICD-10-CM | POA: Insufficient documentation

## 2013-03-23 MED ORDER — MORPHINE SULFATE ER 30 MG PO TBCR
30.0000 mg | EXTENDED_RELEASE_TABLET | Freq: Two times a day (BID) | ORAL | Status: DC
Start: 2013-03-23 — End: 2013-04-23

## 2013-03-23 NOTE — Patient Instructions (Signed)
Continue with PT, and continue with your exercise program at home.

## 2013-03-23 NOTE — Progress Notes (Signed)
Subjective:    Patient ID: Cindy Pratt, female    DOB: [birth year], 47 y.o.   MRN: [id number]  HPI The patient complains about chronic low back pain . The patient also complains about pain all over her body, and she feels very fatigued. She d/c her Savella, because of financial reasons. She also decreased her morphine from 30mg  tid to 30mg  bid. She has tried to decrease her morphine to 15mg  tid, but this did not work for her, she is back on the 30mg  bid. She reports, that she has gotten back together with her husband . She states, that she is still doing her walking program, and works some in her garden as pain permits.  The problem has been stable.  She is doing PT now, which helps some.  Pain Inventory Average Pain 8 Pain Right Now 9 My pain is sharp, burning, stabbing and aching  In the last 24 hours, has pain interfered with the following? General activity 9 Relation with others 9 Enjoyment of life 9 What TIME of day is your pain at its worst? constant Sleep (in general) Poor  Pain is worse with: walking, bending, sitting, inactivity, standing and some activites Pain improves with: medication Relief from Meds: na  Mobility Do you have any goals in this area?  no  Function Do you have any goals in this area?  no  Neuro/Psych weakness numbness tingling depression anxiety  Prior Studies Any changes since last visit?  no  Physicians involved in your care Any changes since last visit?  no   Family History  Problem Relation Age of Onset  . Hypertension Mother   . Hypertension Father   . Breast cancer Maternal Grandmother   . Liver cancer Maternal Grandmother   . Cancer Maternal Grandmother     breast cancer  . Ovarian cancer Cousin   . Cancer Cousin     1st c. maternal side/ cervical cancer  . Diabetes Maternal Grandfather     both sides  . Cancer Cousin     1st c maternal/ colon cancer   History   Social History  . Marital Status: Married    Spouse  Name: N/A    Number of Children: 4  . Years of Education: N/A   Occupational History  . disabled    Social History Main Topics  . Smoking status: Former Smoker -- 1.00 packs/day for 20 years    Types: Cigarettes    Quit date: 07/31/2007  . Smokeless tobacco: Never Used  . Alcohol Use: No  . Drug Use: No  . Sexually Active: Yes    Birth Control/ Protection: Surgical   Other Topics Concern  . None   Social History Narrative  . None   Past Surgical History  Procedure Laterality Date  . Back surgery  1997, 2003  . Abdominal hysterectomy    . Cystectomy    . Diagnostic laparoscopy     Past Medical History  Diagnosis Date  . Fibromyalgia   . Anxiety   . Depression   . Hyperlipidemia   . Asthma   . Degenerative joint disease   . Osteoarthritis   . Belchings   . Lumbar post-laminectomy syndrome   . Lumbosacral neuritis   . Disturbance of skin sensation   . Sciatica   . Chronic pain syndrome   . Other chronic postoperative pain    BP 106/70  Pulse 60  Resp 16  Ht 5\' 3"  (1.6 m)  Wt 146 lb (66.225  kg)  BMI 25.87 kg/m2  SpO2 99%     Review of Systems  Neurological: Positive for weakness and numbness.       Tingling  Psychiatric/Behavioral: Positive for dysphoric mood and agitation.  All other systems reviewed and are negative.       Objective:   Physical Exam Constitutional: She is oriented to person, place, and time. She appears well-developed and well-nourished.  HENT:  Head: Normocephalic.  Neck: Neck supple.  Musculoskeletal: She exhibits tenderness .  Neurological: She is alert and oriented to person, place, and time.  Skin: Skin is warm and dry.  Psychiatric: She has a normal mood and affect.  Symmetric normal motor tone is noted throughout. Normal muscle bulk. Muscle testing reveals 5/5 muscle strength of the upper extremity, and 5/5 of the lower extremity, except give away weakness on left iliopsoas 4-/5, and weakness in tibialis anterior 4-/5.  Full range of motion in upper and lower extremities. ROM of spine is restricted. Fine motor movements are normal in both hands.  DTR in the upper and lower extremity are present and symmetric 2+. No clonus is noted.  Patient arises from chair without difficulty. Narrow based gait with normal arm swing bilateral , able to stand on heels and toes . Tandem walk is stable. No pronator drift. Rhomberg negative.  Head protruded, hyper extension of C-spine.        Assessment & Plan:  1. Lumbar postlaminectomy syndrome status post L5-S1 fusion, pain with radiating into her LLE in a S1 distribution intermittendly. Ordered PT for pain relief, and possible McKenzie therapy, she has finally started this. Consider MRI if it does not get better.  Last MRI approximately one year ago showing no significant adjacent level disease.. She reports that she has decreased her morphine sulfate 30 mg three times a day,to bid, and this still gives her sufficient pain controll.  2. fibromyalgia syndrome stable, although patient has d/c Savella .  3. neck pain, x-ray of her neck showed mild degenerative changes.  4. Thoracic pain, ordered x-ray of T-spine, which did not show any significant findings.  Refilled Morphine, MS-Contin, 30mg  bid .  Advised patient to continue with her walking and exercising as pain permits.  PA visit in 1 month

## 2013-03-30 ENCOUNTER — Encounter: Payer: Self-pay | Admitting: Physical Medicine and Rehabilitation

## 2013-04-23 ENCOUNTER — Encounter: Payer: Self-pay | Admitting: Physical Medicine and Rehabilitation

## 2013-04-23 ENCOUNTER — Encounter
Payer: PRIVATE HEALTH INSURANCE | Attending: Physical Medicine and Rehabilitation | Admitting: Physical Medicine and Rehabilitation

## 2013-04-23 VITALS — BP 90/53 | HR 70 | Resp 14 | Ht 63.0 in | Wt 149.4 lb

## 2013-04-23 DIAGNOSIS — M545 Low back pain, unspecified: Secondary | ICD-10-CM | POA: Insufficient documentation

## 2013-04-23 DIAGNOSIS — G8929 Other chronic pain: Secondary | ICD-10-CM | POA: Insufficient documentation

## 2013-04-23 DIAGNOSIS — M542 Cervicalgia: Secondary | ICD-10-CM | POA: Insufficient documentation

## 2013-04-23 DIAGNOSIS — R5381 Other malaise: Secondary | ICD-10-CM | POA: Insufficient documentation

## 2013-04-23 DIAGNOSIS — IMO0001 Reserved for inherently not codable concepts without codable children: Secondary | ICD-10-CM | POA: Insufficient documentation

## 2013-04-23 DIAGNOSIS — Z981 Arthrodesis status: Secondary | ICD-10-CM | POA: Insufficient documentation

## 2013-04-23 DIAGNOSIS — M961 Postlaminectomy syndrome, not elsewhere classified: Secondary | ICD-10-CM | POA: Insufficient documentation

## 2013-04-23 DIAGNOSIS — E785 Hyperlipidemia, unspecified: Secondary | ICD-10-CM | POA: Insufficient documentation

## 2013-04-23 DIAGNOSIS — M546 Pain in thoracic spine: Secondary | ICD-10-CM | POA: Insufficient documentation

## 2013-04-23 MED ORDER — MORPHINE SULFATE ER 30 MG PO TBCR: 30.0000 mg | EXTENDED_RELEASE_TABLET | Freq: Two times a day (BID) | ORAL | Status: DC

## 2013-04-23 NOTE — Patient Instructions (Addendum)
Stay as active as tolerated, continue with your walking.

## 2013-04-23 NOTE — Progress Notes (Signed)
Subjective:    Patient ID: Cindy Pratt, female    DOB: 03-24-1966, 47 y.o.   MRN: [id number]  HPI The patient complains about chronic low back pain . The patient also complains about pain all over her body, and she feels very fatigued. She d/c her Savella, because of financial reasons. She also decreased her morphine from 30mg  tid to 30mg  bid. She has tried to decrease her morphine to 15mg  tid, but this did not work for her, she is back on the 30mg  bid. She reports, that she has gotten back together with her husband . She states, that she is still doing her walking program, and works some in her garden as pain permits.  The problem has been stable. She has done  PT only once, she has depression, and did not want to go out of her house, also the PT was very expensive.   Pain Inventory Average Pain 8 Pain Right Now 9 My pain is constant, sharp, burning, dull, tingling and aching  In the last 24 hours, has pain interfered with the following? General activity 7 Relation with others 7 Enjoyment of life 7 What TIME of day is your pain at its worst? all Sleep (in general) Poor  Pain is worse with: all if done along time Pain improves with: short time medication Relief from Meds: 9  Mobility walk with assistance how many minutes can you walk? na ability to climb steps?  yes do you drive?  yes Do you have any goals in this area?  no  Function Do you have any goals in this area?  no  Neuro/Psych weakness tingling confusion depression anxiety  Prior Studies Any changes since last visit?  no  Physicians involved in your care Any changes since last visit?  no   Family History  Problem Relation Age of Onset  . Hypertension Mother   . Hypertension Father   . Breast cancer Maternal Grandmother   . Liver cancer Maternal Grandmother   . Cancer Maternal Grandmother     breast cancer  . Ovarian cancer Cousin   . Cancer Cousin     1st c. maternal side/ cervical cancer  .  Diabetes Maternal Grandfather     both sides  . Cancer Cousin     1st c maternal/ colon cancer   History   Social History  . Marital Status: Married    Spouse Name: N/A    Number of Children: 4  . Years of Education: N/A   Occupational History  . disabled    Social History Main Topics  . Smoking status: Former Smoker -- 1.00 packs/day for 20 years    Types: Cigarettes    Quit date: 07/31/2007  . Smokeless tobacco: Never Used  . Alcohol Use: No  . Drug Use: No  . Sexual Activity: Yes    Birth Control/ Protection: Surgical   Other Topics Concern  . None   Social History Narrative  . None   Past Surgical History  Procedure Laterality Date  . Back surgery  1997, 2003  . Abdominal hysterectomy    . Cystectomy    . Diagnostic laparoscopy     Past Medical History  Diagnosis Date  . Fibromyalgia   . Anxiety   . Depression   . Hyperlipidemia   . Asthma   . Degenerative joint disease   . Osteoarthritis   . Belchings   . Lumbar post-laminectomy syndrome   . Lumbosacral neuritis   . Disturbance of skin  sensation   . Sciatica   . Chronic pain syndrome   . Other chronic postoperative pain    BP 90/53  Pulse 70  Resp 14  Ht 5\' 3"  (1.6 m)  Wt 149 lb 6.4 oz (67.767 kg)  BMI 26.47 kg/m2  SpO2 96%    Review of Systems  Neurological: Positive for weakness.       Tingling  Psychiatric/Behavioral: Positive for confusion and dysphoric mood. The patient is nervous/anxious.   All other systems reviewed and are negative.       Objective:   Physical Exam Constitutional: She is oriented to person, place, and time. She appears well-developed and well-nourished.  HENT:  Head: Normocephalic.  Neck: Neck supple.  Musculoskeletal: She exhibits tenderness .  Neurological: She is alert and oriented to person, place, and time.  Skin: Skin is warm and dry.  Psychiatric: She has a normal mood and affect.  Symmetric normal motor tone is noted throughout. Normal muscle  bulk. Muscle testing reveals 5/5 muscle strength of the upper extremity, and 5/5 of the lower extremity, except give away weakness on left iliopsoas 4-/5, and weakness in tibialis anterior 4-/5. Full range of motion in upper and lower extremities. ROM of spine is restricted. Fine motor movements are normal in both hands.  DTR in the upper and lower extremity are present and symmetric 2+. No clonus is noted.  Patient arises from chair without difficulty. Narrow based gait with normal arm swing bilateral , able to stand on heels and toes . Tandem walk is stable. No pronator drift. Rhomberg negative.  Head protruded, hyper extension of C-spine.        Assessment & Plan:  1. Lumbar postlaminectomy syndrome status post L5-S1 fusion, pain with radiating into her LLE in a S1 distribution intermittendly. Ordered PT for pain relief, and possible McKenzie therapy, she has finally started this.She only went once, because of her depression and because of costs. Unfortunately the exercises the PT gave her, per patient were not helping, he also did not McKenzie, he actually did exercises which were the opposite of McKenzie, and not helpful. I showed patient exercises she could do with a thera-band, and some McKenzie posture training. Consider MRI if it does not get better.  Last MRI approximately one year ago showing no significant adjacent level disease.. She reports that she has decreased her morphine sulfate 30 mg three times a day,to bid, and this still gives her sufficient pain controll.  2. fibromyalgia syndrome stable, although patient has d/c Savella .  3. neck pain, x-ray of her neck showed mild degenerative changes.  4. Thoracic pain, ordered x-ray of T-spine, which did not show any significant findings.  Refilled Morphine, MS-Contin, 30mg  bid .  Advised patient to continue with her walking and exercising as pain permits.  PA visit in 1 month

## 2013-04-24 ENCOUNTER — Encounter: Payer: PRIVATE HEALTH INSURANCE | Admitting: Physical Medicine and Rehabilitation

## 2013-05-08 ENCOUNTER — Other Ambulatory Visit: Payer: Self-pay

## 2013-05-08 MED ORDER — TIZANIDINE HCL 4 MG PO TABS: 4.0000 mg | ORAL_TABLET | Freq: Two times a day (BID) | ORAL | Status: DC

## 2013-05-08 NOTE — Telephone Encounter (Signed)
Refill for Tizanidine sent to [facility]

## 2013-05-18 ENCOUNTER — Telehealth: Payer: Self-pay | Admitting: *Deleted

## 2013-05-18 NOTE — Telephone Encounter (Signed)
Her doctor has put her on adipex-p and wanted to make sure it was ok before she fills it. I  Left her a message and listed it in her medication list so that it would show up as prescribed if UDS taken.

## 2013-05-21 ENCOUNTER — Encounter: Payer: Self-pay | Admitting: Physical Medicine and Rehabilitation

## 2013-05-21 ENCOUNTER — Encounter
Payer: PRIVATE HEALTH INSURANCE | Attending: Physical Medicine and Rehabilitation | Admitting: Physical Medicine and Rehabilitation

## 2013-05-21 VITALS — BP 106/61 | HR 81 | Resp 14 | Ht 63.0 in | Wt 148.0 lb

## 2013-05-21 DIAGNOSIS — IMO0001 Reserved for inherently not codable concepts without codable children: Secondary | ICD-10-CM

## 2013-05-21 DIAGNOSIS — Z79899 Other long term (current) drug therapy: Secondary | ICD-10-CM

## 2013-05-21 DIAGNOSIS — F329 Major depressive disorder, single episode, unspecified: Secondary | ICD-10-CM | POA: Insufficient documentation

## 2013-05-21 DIAGNOSIS — Z981 Arthrodesis status: Secondary | ICD-10-CM | POA: Insufficient documentation

## 2013-05-21 DIAGNOSIS — F3289 Other specified depressive episodes: Secondary | ICD-10-CM | POA: Insufficient documentation

## 2013-05-21 DIAGNOSIS — M546 Pain in thoracic spine: Secondary | ICD-10-CM | POA: Insufficient documentation

## 2013-05-21 DIAGNOSIS — M961 Postlaminectomy syndrome, not elsewhere classified: Secondary | ICD-10-CM | POA: Insufficient documentation

## 2013-05-21 DIAGNOSIS — Z5181 Encounter for therapeutic drug level monitoring: Secondary | ICD-10-CM

## 2013-05-21 DIAGNOSIS — M542 Cervicalgia: Secondary | ICD-10-CM | POA: Insufficient documentation

## 2013-05-21 MED ORDER — MORPHINE SULFATE ER 30 MG PO TBCR: 30.0000 mg | EXTENDED_RELEASE_TABLET | Freq: Two times a day (BID) | ORAL | Status: DC

## 2013-05-21 NOTE — Progress Notes (Signed)
Subjective:    Patient ID: Cindy Pratt, female    DOB: 09/22/1965, 47 y.o.   MRN: [id number]  HPI The patient complains about chronic low back pain . The patient also complains about pain all over her body, and she feels very fatigued. She d/c her Savella, because of financial reasons. She also decreased her morphine from 30mg  tid to 30mg  bid. She has tried to decrease her morphine to 15mg  tid, but this did not work for her, she is back on the 30mg  bid. She reports, that she has gotten back together with her husband . She states, that she is still doing her walking program, and works some in her garden as pain permits.  The problem has been stable. Her daughter has moved close to her, with the 40 month old grand son, the patient is in a good mood.  Pain Inventory Average Pain 4 Pain Right Now 5 My pain is constant, sharp, burning, stabbing and aching  In the last 24 hours, has pain interfered with the following? General activity 8 Relation with others 5 Enjoyment of life 8 What TIME of day is your pain at its worst? constant Sleep (in general) Poor  Pain is worse with: walking, bending, sitting, inactivity, standing and some activites Pain improves with: pacing activities Relief from Meds: 8  Mobility Do you have any goals in this area?  no  Function Do you have any goals in this area?  no  Neuro/Psych confusion depression anxiety  Prior Studies Any changes since last visit?  no  Physicians involved in your care Any changes since last visit?  no   Family History  Problem Relation Age of Onset  . Hypertension Mother   . Hypertension Father   . Breast cancer Maternal Grandmother   . Liver cancer Maternal Grandmother   . Cancer Maternal Grandmother     breast cancer  . Ovarian cancer Cousin   . Cancer Cousin     1st c. maternal side/ cervical cancer  . Diabetes Maternal Grandfather     both sides  . Cancer Cousin     1st c maternal/ colon cancer   History    Social History  . Marital Status: Married    Spouse Name: N/A    Number of Children: 4  . Years of Education: N/A   Occupational History  . disabled    Social History Main Topics  . Smoking status: Former Smoker -- 1.00 packs/day for 20 years    Types: Cigarettes    Quit date: 07/31/2007  . Smokeless tobacco: Never Used  . Alcohol Use: No  . Drug Use: No  . Sexual Activity: Yes    Birth Control/ Protection: Surgical   Other Topics Concern  . None   Social History Narrative  . None   Past Surgical History  Procedure Laterality Date  . Back surgery  1997, 2003  . Abdominal hysterectomy    . Cystectomy    . Diagnostic laparoscopy     Past Medical History  Diagnosis Date  . Fibromyalgia   . Anxiety   . Depression   . Hyperlipidemia   . Asthma   . Degenerative joint disease   . Osteoarthritis   . Belchings   . Lumbar post-laminectomy syndrome   . Lumbosacral neuritis   . Disturbance of skin sensation   . Sciatica   . Chronic pain syndrome   . Other chronic postoperative pain    BP 106/61  Pulse 81  Resp  14  Ht 5\' 3"  (1.6 m)  Wt 148 lb (67.132 kg)  BMI 26.22 kg/m2  SpO2 97%     Review of Systems  Musculoskeletal: Positive for back pain.  Psychiatric/Behavioral: Positive for confusion and dysphoric mood. The patient is nervous/anxious.   All other systems reviewed and are negative.       Objective:   Physical Exam Constitutional: She is oriented to person, place, and time. She appears well-developed and well-nourished.  HENT:  Head: Normocephalic.  Neck: Neck supple.  Musculoskeletal: She exhibits tenderness .  Neurological: She is alert and oriented to person, place, and time.  Skin: Skin is warm and dry.  Psychiatric: She has a normal mood and affect.  Symmetric normal motor tone is noted throughout. Normal muscle bulk. Muscle testing reveals 5/5 muscle strength of the upper extremity, and 5/5 of the lower extremity, except give away  weakness on left iliopsoas 4-/5, and weakness in tibialis anterior 4-/5. Full range of motion in upper and lower extremities. ROM of spine is restricted. Fine motor movements are normal in both hands.  DTR in the upper and lower extremity are present and symmetric 2+. No clonus is noted.  Patient arises from chair without difficulty. Narrow based gait with normal arm swing bilateral , able to stand on heels and toes . Tandem walk is stable. No pronator drift. Rhomberg negative.  Head protruded, hyper extension of C-spine.        Assessment & Plan:  1. Lumbar postlaminectomy syndrome status post L5-S1 fusion, pain with radiating into her LLE in a S1 distribution intermittendly. Ordered PT for pain relief, and possible McKenzie therapy, she has finally started this.She only went once, because of her depression and because of costs. Unfortunately the exercises the PT gave her, per patient were not helping, he also did not McKenzie, he actually did exercises which were the opposite of McKenzie, and not helpful. I showed patient exercises she could do with a thera-band, and some McKenzie posture training. Consider MRI if it does not get better.  Last MRI approximately one year ago showing no significant adjacent level disease.. She reports that she has decreased her morphine sulfate 30 mg three times a day,to bid, and this still gives her sufficient pain controll.  2. fibromyalgia syndrome stable, although patient has d/c Savella .  3. neck pain, x-ray of her neck showed mild degenerative changes.  4. Thoracic pain, ordered x-ray of T-spine, which did not show any significant findings.  Refilled Morphine, MS-Contin, 30mg  bid .  Advised patient to continue with her walking and exercising as pain permits.  PA visit in 1 month

## 2013-05-21 NOTE — Patient Instructions (Signed)
Try to stay as active as tolerated, try to start a walking program

## 2013-06-19 ENCOUNTER — Encounter
Payer: PRIVATE HEALTH INSURANCE | Attending: Physical Medicine and Rehabilitation | Admitting: Physical Medicine and Rehabilitation

## 2013-06-19 ENCOUNTER — Encounter: Payer: Self-pay | Admitting: Physical Medicine and Rehabilitation

## 2013-06-19 VITALS — BP 94/60 | HR 71 | Resp 14 | Ht 63.5 in | Wt 151.6 lb

## 2013-06-19 DIAGNOSIS — Z981 Arthrodesis status: Secondary | ICD-10-CM | POA: Insufficient documentation

## 2013-06-19 DIAGNOSIS — R5381 Other malaise: Secondary | ICD-10-CM | POA: Insufficient documentation

## 2013-06-19 DIAGNOSIS — M47812 Spondylosis without myelopathy or radiculopathy, cervical region: Secondary | ICD-10-CM | POA: Insufficient documentation

## 2013-06-19 DIAGNOSIS — M545 Low back pain, unspecified: Secondary | ICD-10-CM | POA: Insufficient documentation

## 2013-06-19 DIAGNOSIS — M542 Cervicalgia: Secondary | ICD-10-CM | POA: Insufficient documentation

## 2013-06-19 DIAGNOSIS — M546 Pain in thoracic spine: Secondary | ICD-10-CM | POA: Insufficient documentation

## 2013-06-19 DIAGNOSIS — G8929 Other chronic pain: Secondary | ICD-10-CM | POA: Insufficient documentation

## 2013-06-19 DIAGNOSIS — IMO0001 Reserved for inherently not codable concepts without codable children: Secondary | ICD-10-CM | POA: Insufficient documentation

## 2013-06-19 DIAGNOSIS — M961 Postlaminectomy syndrome, not elsewhere classified: Secondary | ICD-10-CM | POA: Insufficient documentation

## 2013-06-19 MED ORDER — MORPHINE SULFATE ER 30 MG PO TBCR: 30.0000 mg | EXTENDED_RELEASE_TABLET | Freq: Two times a day (BID) | ORAL | Status: DC

## 2013-06-19 NOTE — Patient Instructions (Signed)
Stay as active as tolerated, continue with applying cold pads as tolerated

## 2013-06-19 NOTE — Progress Notes (Signed)
Subjective:    Patient ID: Cindy Pratt, female    DOB: Sep 08, 1965, 47 y.o.   MRN: [id number]  HPI The patient complains about chronic low back pain . The patient also complains about pain all over her body, and she feels very fatigued. She d/c her Savella, because of financial reasons. She also decreased her morphine from 30mg  tid to 30mg  bid. She has tried to decrease her morphine to 15mg  tid, but this did not work for her, she is back on the 30mg  bid. She reports, that she has gotten back together with her husband . She states, that she is still doing her walking program, and works some in her garden as pain permits.  The problem has gotten worse.   Her daughter has moved close to her, with the 47 month old grand son, the patient is in a good mood.   Pain Inventory Average Pain 7 Pain Right Now 8 My pain is sharp, burning, stabbing and aching  In the last 24 hours, has pain interfered with the following? General activity 8 Relation with others 8 Enjoyment of life 8 What TIME of day is your pain at its worst? all Sleep (in general) Fair  Pain is worse with: all Pain improves with: medication and changing positions Relief from Meds: 5  Mobility Do you have any goals in this area?  no  Function Do you have any goals in this area?  no  Neuro/Psych weakness tingling depression anxiety  Prior Studies Any changes since last visit?  no  Physicians involved in your care Any changes since last visit?  no   Family History  Problem Relation Age of Onset  . Hypertension Mother   . Hypertension Father   . Breast cancer Maternal Grandmother   . Liver cancer Maternal Grandmother   . Cancer Maternal Grandmother     breast cancer  . Ovarian cancer Cousin   . Cancer Cousin     1st c. maternal side/ cervical cancer  . Diabetes Maternal Grandfather     both sides  . Cancer Cousin     1st c maternal/ colon cancer   History   Social History  . Marital Status: Married     Spouse Name: N/A    Number of Children: 4  . Years of Education: N/A   Occupational History  . disabled    Social History Main Topics  . Smoking status: Former Smoker -- 1.00 packs/day for 20 years    Types: Cigarettes    Quit date: 07/31/2007  . Smokeless tobacco: Never Used  . Alcohol Use: No  . Drug Use: No  . Sexual Activity: Yes    Birth Control/ Protection: Surgical   Other Topics Concern  . None   Social History Narrative  . None   Past Surgical History  Procedure Laterality Date  . Back surgery  1997, 2003  . Abdominal hysterectomy    . Cystectomy    . Diagnostic laparoscopy     Past Medical History  Diagnosis Date  . Fibromyalgia   . Anxiety   . Depression   . Hyperlipidemia   . Asthma   . Degenerative joint disease   . Osteoarthritis   . Belchings   . Lumbar post-laminectomy syndrome   . Lumbosacral neuritis   . Disturbance of skin sensation   . Sciatica   . Chronic pain syndrome   . Other chronic postoperative pain    BP 94/60  Pulse 71  Resp 14  Ht 5' 3.5" (1.613 m)  Wt 151 lb 9.6 oz (68.765 kg)  BMI 26.43 kg/m2  SpO2 98%    Review of Systems  Neurological: Positive for weakness.       Tingling  Psychiatric/Behavioral: Positive for dysphoric mood. The patient is nervous/anxious.   All other systems reviewed and are negative.       Objective:   Physical Exam Constitutional: She is oriented to person, place, and time. She appears well-developed and well-nourished.  HENT:  Head: Normocephalic.  Neck: Neck supple.  Musculoskeletal: She exhibits tenderness .  Neurological: She is alert and oriented to person, place, and time.  Skin: Skin is warm and dry.  Psychiatric: She has a normal mood and affect.  Symmetric normal motor tone is noted throughout. Normal muscle bulk. Muscle testing reveals 5/5 muscle strength of the upper extremity, and 5/5 of the lower extremity, except give away weakness on left iliopsoas 4-/5, and weakness in  tibialis anterior 4-/5. Full range of motion in upper and lower extremities. ROM of spine is restricted. Fine motor movements are normal in both hands.  DTR in the upper and lower extremity are present and symmetric 2+. No clonus is noted.  Patient arises from chair without difficulty. Narrow based gait with normal arm swing bilateral , able to stand on heels and toes . Tandem walk is stable. No pronator drift. Rhomberg negative.  Head protruded, hyper extension of C-spine.        Assessment & Plan:  1. Lumbar postlaminectomy syndrome status post L5-S1 fusion, pain with radiating into her LLE in a S1 distribution intermittendly. Ordered PT for pain relief, and possible McKenzie therapy, she has finally started this.She only went once, because of her depression and because of costs. Unfortunately the exercises the PT gave her, per patient were not helping, he also did not McKenzie, he actually did exercises which were the opposite of McKenzie, and not helpful. I showed patient exercises she could do with a thera-band, and some McKenzie posture training. Her symptoms have increased further, ordered MRI w/wo of L-spine today. Consider treatment options after receiving results.  Last MRI approximately one year ago showing no significant adjacent level disease.. She reports that she has decreased her morphine sulfate 30 mg three times a day,to bid, and this still gives her sufficient pain controll.  2. fibromyalgia syndrome stable, although patient has d/c Savella .  3. neck pain, x-ray of her neck showed mild degenerative changes.  4. Thoracic pain, ordered x-ray of T-spine, which did not show any significant findings.  Refilled Morphine, MS-Contin, 30mg  bid .  Advised patient to continue with her walking and exercising as pain permits.  PA visit in 1 month

## 2013-06-29 ENCOUNTER — Ambulatory Visit
Admission: RE | Admit: 2013-06-29 | Discharge: 2013-06-29 | Disposition: A | Discharge status: Home / Self Care | Payer: PRIVATE HEALTH INSURANCE | Source: Ambulatory Visit | Attending: Physical Medicine and Rehabilitation | Admitting: Physical Medicine and Rehabilitation

## 2013-06-29 DIAGNOSIS — M961 Postlaminectomy syndrome, not elsewhere classified: Secondary | ICD-10-CM

## 2013-06-29 DIAGNOSIS — M545 Low back pain: Secondary | ICD-10-CM

## 2013-06-30 ENCOUNTER — Inpatient Hospital Stay: Admission: RE | Admit: 2013-06-30 | Payer: PRIVATE HEALTH INSURANCE | Source: Ambulatory Visit

## 2013-07-05 ENCOUNTER — Other Ambulatory Visit: Payer: Self-pay

## 2013-07-17 ENCOUNTER — Encounter
Payer: PRIVATE HEALTH INSURANCE | Attending: Physical Medicine and Rehabilitation | Admitting: Physical Medicine and Rehabilitation

## 2013-07-17 ENCOUNTER — Encounter: Payer: Self-pay | Admitting: Physical Medicine and Rehabilitation

## 2013-07-17 ENCOUNTER — Telehealth: Payer: Self-pay

## 2013-07-17 VITALS — BP 108/66 | HR 73 | Resp 14 | Ht 63.5 in | Wt 154.0 lb

## 2013-07-17 DIAGNOSIS — M545 Low back pain: Secondary | ICD-10-CM

## 2013-07-17 DIAGNOSIS — M542 Cervicalgia: Secondary | ICD-10-CM | POA: Insufficient documentation

## 2013-07-17 DIAGNOSIS — IMO0001 Reserved for inherently not codable concepts without codable children: Secondary | ICD-10-CM | POA: Insufficient documentation

## 2013-07-17 DIAGNOSIS — M79609 Pain in unspecified limb: Secondary | ICD-10-CM | POA: Insufficient documentation

## 2013-07-17 DIAGNOSIS — M546 Pain in thoracic spine: Secondary | ICD-10-CM | POA: Insufficient documentation

## 2013-07-17 DIAGNOSIS — M961 Postlaminectomy syndrome, not elsewhere classified: Secondary | ICD-10-CM | POA: Insufficient documentation

## 2013-07-17 MED ORDER — MORPHINE SULFATE ER 30 MG PO TBCR
30.0000 mg | EXTENDED_RELEASE_TABLET | Freq: Two times a day (BID) | ORAL | Status: DC
Start: 2013-07-17 — End: 2013-08-13

## 2013-07-17 NOTE — Telephone Encounter (Signed)
Error

## 2013-07-17 NOTE — Patient Instructions (Signed)
Try to stay as active as tolerated 

## 2013-07-17 NOTE — Progress Notes (Signed)
Subjective:    Patient ID: Cindy Pratt, female    DOB: 14-Apr-1966, 47 y.o.   MRN: [id number]  HPI The patient complains about chronic low back pain . The patient also complains about pain all over her body, and she feels very fatigued. She d/c her Savella, because of financial reasons. She also decreased her morphine from 30mg  tid to 30mg  bid. She has tried to decrease her morphine to 15mg  tid, but this did not work for her, she is back on the 30mg  bid. She reports, that she has gotten back together with her husband . She states, that she is still doing her walking program, and works some in her garden as pain permits.  The problem has gotten worse.    Pain Inventory Average Pain 9 Pain Right Now 8 My pain is constant, sharp, burning, tingling and aching  In the last 24 hours, has pain interfered with the following? General activity 8 Relation with others 8 Enjoyment of life 8 What TIME of day is your pain at its worst? morning Sleep (in general) Fair  Pain is worse with: some activites Pain improves with: medication Relief from Meds: 5  Mobility Do you have any goals in this area?  no  Function Do you have any goals in this area?  no  Neuro/Psych weakness numbness tingling confusion depression anxiety  Prior Studies CT/MRI  Physicians involved in your care Any changes since last visit?  no   Family History  Problem Relation Age of Onset  . Hypertension Mother   . Hypertension Father   . Breast cancer Maternal Grandmother   . Liver cancer Maternal Grandmother   . Cancer Maternal Grandmother     breast cancer  . Ovarian cancer Cousin   . Cancer Cousin     1st c. maternal side/ cervical cancer  . Diabetes Maternal Grandfather     both sides  . Cancer Cousin     1st c maternal/ colon cancer   History   Social History  . Marital Status: Married    Spouse Name: N/A    Number of Children: 4  . Years of Education: N/A   Occupational History  .  disabled    Social History Main Topics  . Smoking status: Former Smoker -- 1.00 packs/day for 20 years    Types: Cigarettes    Quit date: 07/31/2007  . Smokeless tobacco: Never Used  . Alcohol Use: No  . Drug Use: No  . Sexual Activity: Yes    Birth Control/ Protection: Surgical   Other Topics Concern  . None   Social History Narrative  . None   Past Surgical History  Procedure Laterality Date  . Back surgery  1997, 2003  . Abdominal hysterectomy    . Cystectomy    . Diagnostic laparoscopy     Past Medical History  Diagnosis Date  . Fibromyalgia   . Anxiety   . Depression   . Hyperlipidemia   . Asthma   . Degenerative joint disease   . Osteoarthritis   . Belchings   . Lumbar post-laminectomy syndrome   . Lumbosacral neuritis   . Disturbance of skin sensation   . Sciatica   . Chronic pain syndrome   . Other chronic postoperative pain    BP 108/66  Pulse 73  Resp 14  Ht 5' 3.5" (1.613 m)  Wt 154 lb (69.854 kg)  BMI 26.85 kg/m2  SpO2 97%     Review of Systems  Musculoskeletal:  Positive for back pain.  Neurological: Positive for weakness and numbness.  Psychiatric/Behavioral: Positive for confusion and dysphoric mood. The patient is nervous/anxious.   All other systems reviewed and are negative.       Objective:   Physical Exam Constitutional: She is oriented to person, place, and time. She appears well-developed and well-nourished.  HENT:  Head: Normocephalic.  Neck: Neck supple.  Musculoskeletal: She exhibits tenderness .  Neurological: She is alert and oriented to person, place, and time.  Skin: Skin is warm and dry.  Psychiatric: She has a normal mood and affect.  Symmetric normal motor tone is noted throughout. Normal muscle bulk. Muscle testing reveals 5/5 muscle strength of the upper extremity, and 5/5 of the lower extremity, except give away weakness on left iliopsoas 4-/5, and weakness in tibialis anterior 4-/5. Full range of motion in  upper and lower extremities. ROM of spine is restricted. Fine motor movements are normal in both hands.  DTR in the upper and lower extremity are present and symmetric 2+. No clonus is noted.  Patient arises from chair without difficulty. Narrow based gait with normal arm swing bilateral , able to stand on heels and toes . Tandem walk is stable. No pronator drift. Rhomberg negative.  Head protruded, hyper extension of C-spine.        Assessment & Plan:  1. Lumbar postlaminectomy syndrome status post L5-S1 fusion, pain with radiating into her LLE in a S1 distribution intermittendly. Ordered PT for pain relief, and possible McKenzie therapy, she has finally started this.She only went once, because of her depression and because of costs. Unfortunately the exercises the PT gave her, per patient were not helping, he also did not McKenzie, he actually did exercises which were the opposite of McKenzie, and not helpful. I showed patient exercises she could do with a thera-band, and some McKenzie posture training.  Her symptoms have increased further, ordered MRI w/wo of L-spine at the last visit,MRI showed: Anatomic alignment. Prior L5-S1 fusion. Integrity of the fusion is  not definitely established with MR nor is the integrity of hardware.  If there is concern for pseudarthrosis, CT lumbar spine without  contrast recommended. Based on the appearance of MR, a hypointense  band extending to the right and left of the L5-S1 interspace could  represent incomplete fusion. There is no residual neural compression  at the fusion site. Ordered CT-scan of L-spine, for evaluation of fusion .   Consider treatment options after receiving results.   2. fibromyalgia syndrome stable, although patient has d/c Savella .  3. neck pain, x-ray of her neck showed mild degenerative changes.  4. Thoracic pain, ordered x-ray of T-spine, which did not show any significant findings.  Refilled Morphine, MS-Contin, 30mg  bid .   Advised patient to continue with her walking and exercising as pain permits.  PA visit in 1 month

## 2013-07-24 ENCOUNTER — Ambulatory Visit
Admission: RE | Admit: 2013-07-24 | Discharge: 2013-07-24 | Disposition: A | Discharge status: Home / Self Care | Payer: PRIVATE HEALTH INSURANCE | Source: Ambulatory Visit | Attending: Physical Medicine and Rehabilitation | Admitting: Physical Medicine and Rehabilitation

## 2013-07-24 DIAGNOSIS — M545 Low back pain: Secondary | ICD-10-CM

## 2013-07-24 DIAGNOSIS — M961 Postlaminectomy syndrome, not elsewhere classified: Secondary | ICD-10-CM

## 2013-07-25 ENCOUNTER — Telehealth: Payer: Self-pay

## 2013-07-25 NOTE — Telephone Encounter (Signed)
Patient informed of CT results 

## 2013-07-25 NOTE — Telephone Encounter (Signed)
Message copied by Judd Gaudier on Wed Jul 25, 2013  1:41 PM ------      Message from: Su Monks      Created: Wed Jul 25, 2013 12:09 PM       Please let patient know, CT scan showed      1. No adverse features status post L5-S1 fusion ; no hardware      loosening, and solid arthrodesis (chiefly at the posterior elements      on the right).      2. Mild adjacent segment disease at L4-L5. Mild facet hypertrophy.      No stenosis.      3. Negative lumbar spine elsewhere.      Basically no significant new findings, no loosening of hardware       ------

## 2013-08-03 ENCOUNTER — Telehealth: Payer: Self-pay

## 2013-08-03 NOTE — Telephone Encounter (Signed)
Patient called and said that she had a MRI and CT done. Patient said that Cindy Pratt had mentioned referring her to a surgeon after having those test completed. Patient is now requesting a referral to a surgeon.

## 2013-08-03 NOTE — Telephone Encounter (Signed)
Left message for patient to call office.  

## 2013-08-03 NOTE — Telephone Encounter (Signed)
Patient returned call to clinic. Informed her that Dr. Wynn Banker would discuss her request for a referral to see a surgeon at her next OV which is 12/18. Patient stated she had increased pain. Recommended using heat/ice to relieve pain. Patient has pain meds already.

## 2013-08-03 NOTE — Telephone Encounter (Signed)
The fusion looks okay no sign of any complications. We can discuss at her next appointment

## 2013-08-08 ENCOUNTER — Telehealth: Payer: Self-pay | Admitting: *Deleted

## 2013-08-08 NOTE — Telephone Encounter (Signed)
Left voicemail message on personally identified VM about her upcoming appt with me for RN visit for med refills.  A previous phone message discussing her wanting to be referred to a surgeon stated that Dr Wynn Banker would talk with her about it in an upcoming visit on 12/18.  There is no appt scheduled for that dat and he has no openings.  He does have some available appts on 08/10/13 so if she would like we could switch her to one of them.  She will need to let us know.

## 2013-08-13 ENCOUNTER — Other Ambulatory Visit: Payer: Self-pay | Admitting: *Deleted

## 2013-08-13 MED ORDER — MORPHINE SULFATE ER 30 MG PO TBCR: 30.0000 mg | EXTENDED_RELEASE_TABLET | Freq: Two times a day (BID) | ORAL | Status: DC

## 2013-08-13 NOTE — Telephone Encounter (Signed)
rx printed for MD to sign for MS Contin 30 mg bid # 60

## 2013-08-14 ENCOUNTER — Encounter: Payer: Self-pay | Admitting: *Deleted

## 2013-08-14 ENCOUNTER — Encounter: Payer: PRIVATE HEALTH INSURANCE | Attending: Physical Medicine & Rehabilitation | Admitting: *Deleted

## 2013-08-14 VITALS — BP 116/70 | HR 86 | Resp 14

## 2013-08-14 DIAGNOSIS — Z981 Arthrodesis status: Secondary | ICD-10-CM | POA: Insufficient documentation

## 2013-08-14 DIAGNOSIS — M545 Low back pain, unspecified: Secondary | ICD-10-CM | POA: Insufficient documentation

## 2013-08-14 DIAGNOSIS — M542 Cervicalgia: Secondary | ICD-10-CM | POA: Insufficient documentation

## 2013-08-14 DIAGNOSIS — M546 Pain in thoracic spine: Secondary | ICD-10-CM | POA: Insufficient documentation

## 2013-08-14 DIAGNOSIS — IMO0002 Reserved for concepts with insufficient information to code with codable children: Secondary | ICD-10-CM

## 2013-08-14 DIAGNOSIS — G894 Chronic pain syndrome: Secondary | ICD-10-CM

## 2013-08-14 DIAGNOSIS — IMO0001 Reserved for inherently not codable concepts without codable children: Secondary | ICD-10-CM | POA: Insufficient documentation

## 2013-08-14 DIAGNOSIS — E785 Hyperlipidemia, unspecified: Secondary | ICD-10-CM | POA: Insufficient documentation

## 2013-08-14 DIAGNOSIS — M961 Postlaminectomy syndrome, not elsewhere classified: Secondary | ICD-10-CM | POA: Insufficient documentation

## 2013-08-14 NOTE — Patient Instructions (Signed)
Please schedule one month follow up with RN for med management  

## 2013-08-14 NOTE — Progress Notes (Signed)
Here for pill count and medication refills. Morphine Sulfate  (MS CONTIN) # 60  Fill date 07/17/13    Today NV#10  No change in pain level or medication list.  She is having a CT tomorrow to evaluate spot on left lung.  No other changes.Pill count appropriate  Refill given. Follow up visit next month for med refill with RN

## 2013-08-15 ENCOUNTER — Ambulatory Visit: Payer: Self-pay | Admitting: Specialist

## 2013-09-06 ENCOUNTER — Other Ambulatory Visit: Payer: Self-pay | Admitting: *Deleted

## 2013-09-06 MED ORDER — MORPHINE SULFATE ER 30 MG PO TBCR: 30.0000 mg | EXTENDED_RELEASE_TABLET | Freq: Two times a day (BID) | ORAL | Status: DC

## 2013-09-06 NOTE — Telephone Encounter (Signed)
RX printed early for controlled medication for the visit with RN on 09/11/13 (to be signed by MD) 

## 2013-09-11 ENCOUNTER — Encounter: Payer: Self-pay | Admitting: *Deleted

## 2013-09-11 ENCOUNTER — Encounter: Payer: PRIVATE HEALTH INSURANCE | Attending: Physical Medicine & Rehabilitation | Admitting: *Deleted

## 2013-09-11 VITALS — BP 108/64 | HR 69 | Resp 14

## 2013-09-11 DIAGNOSIS — IMO0002 Reserved for concepts with insufficient information to code with codable children: Secondary | ICD-10-CM

## 2013-09-11 DIAGNOSIS — M546 Pain in thoracic spine: Secondary | ICD-10-CM | POA: Insufficient documentation

## 2013-09-11 DIAGNOSIS — F329 Major depressive disorder, single episode, unspecified: Secondary | ICD-10-CM | POA: Insufficient documentation

## 2013-09-11 DIAGNOSIS — F3289 Other specified depressive episodes: Secondary | ICD-10-CM | POA: Insufficient documentation

## 2013-09-11 DIAGNOSIS — J45909 Unspecified asthma, uncomplicated: Secondary | ICD-10-CM | POA: Insufficient documentation

## 2013-09-11 DIAGNOSIS — M961 Postlaminectomy syndrome, not elsewhere classified: Secondary | ICD-10-CM | POA: Insufficient documentation

## 2013-09-11 DIAGNOSIS — Z79899 Other long term (current) drug therapy: Secondary | ICD-10-CM | POA: Insufficient documentation

## 2013-09-11 DIAGNOSIS — IMO0001 Reserved for inherently not codable concepts without codable children: Secondary | ICD-10-CM | POA: Insufficient documentation

## 2013-09-11 DIAGNOSIS — M47812 Spondylosis without myelopathy or radiculopathy, cervical region: Secondary | ICD-10-CM | POA: Insufficient documentation

## 2013-09-11 DIAGNOSIS — E785 Hyperlipidemia, unspecified: Secondary | ICD-10-CM | POA: Insufficient documentation

## 2013-09-11 DIAGNOSIS — G894 Chronic pain syndrome: Secondary | ICD-10-CM

## 2013-09-11 DIAGNOSIS — F411 Generalized anxiety disorder: Secondary | ICD-10-CM | POA: Insufficient documentation

## 2013-09-11 NOTE — Patient Instructions (Signed)
Follow up one month with Dr Kirsteins or Pam Love PA 

## 2013-09-11 NOTE — Progress Notes (Signed)
Here for pill count and medication refills. Morphine Sulfate ER 30 mg # 60  Today NV# 7 Fill date  08/14/13   Pill count appropriate.  VSS    Pain level:9  Pain runs from mid low back to sacral area.  She does not want to consider injections if indicated with Dr Wynn BankerKirsteins   Follow up in one month with Dr Wynn BankerKirsteins  Or Marissa NestlePam Love PA if not appt available.

## 2013-10-03 ENCOUNTER — Other Ambulatory Visit: Payer: Self-pay | Admitting: *Deleted

## 2013-10-03 MED ORDER — MORPHINE SULFATE ER 30 MG PO TBCR: 30.0000 mg | EXTENDED_RELEASE_TABLET | Freq: Two times a day (BID) | ORAL | Status: DC

## 2013-10-03 NOTE — Telephone Encounter (Signed)
RX printed early for controlled medication for the visit with RN on 10/10/13 (to be signed by MD) 

## 2013-10-09 ENCOUNTER — Telehealth: Payer: Self-pay

## 2013-10-09 NOTE — Telephone Encounter (Signed)
Needs MD or PA re eval if Med change is requested, may next month with me or PA, cont current dose of now

## 2013-10-09 NOTE — Telephone Encounter (Signed)
Patient has a RN visit on 2/11. She is requesting a increase on her Morphine from bid to tid. Please advise.

## 2013-10-10 ENCOUNTER — Telehealth: Payer: Self-pay | Admitting: *Deleted

## 2013-10-10 ENCOUNTER — Encounter: Payer: Self-pay | Admitting: *Deleted

## 2013-10-10 ENCOUNTER — Encounter: Payer: PRIVATE HEALTH INSURANCE | Attending: Physical Medicine and Rehabilitation | Admitting: *Deleted

## 2013-10-10 VITALS — BP 103/57 | HR 67 | Resp 14

## 2013-10-10 DIAGNOSIS — IMO0001 Reserved for inherently not codable concepts without codable children: Secondary | ICD-10-CM | POA: Insufficient documentation

## 2013-10-10 DIAGNOSIS — F329 Major depressive disorder, single episode, unspecified: Secondary | ICD-10-CM | POA: Insufficient documentation

## 2013-10-10 DIAGNOSIS — IMO0002 Reserved for concepts with insufficient information to code with codable children: Secondary | ICD-10-CM

## 2013-10-10 DIAGNOSIS — M546 Pain in thoracic spine: Secondary | ICD-10-CM | POA: Insufficient documentation

## 2013-10-10 DIAGNOSIS — M961 Postlaminectomy syndrome, not elsewhere classified: Secondary | ICD-10-CM | POA: Insufficient documentation

## 2013-10-10 DIAGNOSIS — G8929 Other chronic pain: Secondary | ICD-10-CM | POA: Insufficient documentation

## 2013-10-10 DIAGNOSIS — G894 Chronic pain syndrome: Secondary | ICD-10-CM

## 2013-10-10 DIAGNOSIS — E785 Hyperlipidemia, unspecified: Secondary | ICD-10-CM | POA: Insufficient documentation

## 2013-10-10 DIAGNOSIS — F3289 Other specified depressive episodes: Secondary | ICD-10-CM | POA: Insufficient documentation

## 2013-10-10 DIAGNOSIS — M542 Cervicalgia: Secondary | ICD-10-CM | POA: Insufficient documentation

## 2013-10-10 DIAGNOSIS — F411 Generalized anxiety disorder: Secondary | ICD-10-CM | POA: Insufficient documentation

## 2013-10-10 DIAGNOSIS — M545 Low back pain, unspecified: Secondary | ICD-10-CM | POA: Insufficient documentation

## 2013-10-10 NOTE — Patient Instructions (Signed)
Follow up with Marissa NestlePam Love PA

## 2013-10-10 NOTE — Telephone Encounter (Signed)
Sybil informed patient that she will need to see the MD or PA next month for a possible medication change, and she will need to continue to take current dose this month.

## 2013-10-10 NOTE — Telephone Encounter (Signed)
Cindy Pratt was in for her visit with me. She had requested that her morphine be increased back to tid from bid and you wanted her to stay with same dose until see by PA or yourself, but we were unaware that she has broken her foot in a fall in January.  She came in today in a black walking cast boot on her right foot.  I am just checking to see if this should be considered.  Both PA and MD appts are very full right now and hard to get back in less than 30-40 days. (this fall was dealt with in [facility] urgent care and then a podiatrist Dr Alberteen Spindleline(?). She broke the foot and has injured the ligaments.

## 2013-10-10 NOTE — Progress Notes (Signed)
Here for pill count and medication refills. MS Contin  30 mg # 60  Fill date 09/13/13 Today NV# 7 Pain level 8 VSS    Had a fall  in January and broke her foot. She was carrying her 4310 month old grandson and tripped over her husband's walker and fell breaking her right foot. She is wearing a boot today. She went to urgent care @ [facility] and was sent to see Dr Alberteen Spindleline, a podiatrist.  She has had some problems since and fears she may have to have surgery eventually.  She has requested to have her morphine increased back to tid but we were not aware of the injury.  I will send a message back to Dr Wynn BankerKirsteins to address. She will follow up next month with Marissa NestlePam Love PA

## 2013-10-11 ENCOUNTER — Ambulatory Visit: Payer: PRIVATE HEALTH INSURANCE | Admitting: Physical Medicine and Rehabilitation

## 2013-10-11 NOTE — Telephone Encounter (Signed)
May increase morphine to TID for one month then resume usual dose

## 2013-10-12 NOTE — Telephone Encounter (Signed)
Notified Cindy Pratt that she can increase to tid for the month her MS Contin.  This means she will run out early due to her rx being #60 bid.  She will call when she is nearing being out of what she has on hand.

## 2013-10-26 ENCOUNTER — Other Ambulatory Visit: Payer: Self-pay

## 2013-10-26 MED ORDER — TIZANIDINE HCL 4 MG PO TABS: 4.0000 mg | ORAL_TABLET | Freq: Two times a day (BID) | ORAL | Status: DC

## 2013-11-01 ENCOUNTER — Telehealth: Payer: Self-pay

## 2013-11-01 MED ORDER — MORPHINE SULFATE ER 30 MG PO TBCR: 30.0000 mg | EXTENDED_RELEASE_TABLET | Freq: Two times a day (BID) | ORAL | Status: DC

## 2013-11-01 NOTE — Telephone Encounter (Signed)
Patient called requesting a refill on Morphine. RX printed for Dr. Wynn BankerKirsteins to sign. Will contact patient when ready for pickup.

## 2013-11-02 NOTE — Telephone Encounter (Signed)
Erroneous encounter

## 2013-11-02 NOTE — Telephone Encounter (Signed)
Contacted patient to inform her that RX was ready for pickup. 

## 2013-11-08 ENCOUNTER — Encounter: Payer: Self-pay | Admitting: Physical Medicine and Rehabilitation

## 2013-11-08 ENCOUNTER — Encounter
Payer: PRIVATE HEALTH INSURANCE | Attending: Physical Medicine and Rehabilitation | Admitting: Physical Medicine and Rehabilitation

## 2013-11-08 VITALS — BP 107/61 | HR 80 | Resp 14 | Ht 63.0 in | Wt 165.0 lb

## 2013-11-08 DIAGNOSIS — M25569 Pain in unspecified knee: Secondary | ICD-10-CM | POA: Insufficient documentation

## 2013-11-08 DIAGNOSIS — M797 Fibromyalgia: Secondary | ICD-10-CM

## 2013-11-08 DIAGNOSIS — M545 Low back pain, unspecified: Secondary | ICD-10-CM | POA: Insufficient documentation

## 2013-11-08 DIAGNOSIS — M25559 Pain in unspecified hip: Secondary | ICD-10-CM | POA: Insufficient documentation

## 2013-11-08 DIAGNOSIS — IMO0001 Reserved for inherently not codable concepts without codable children: Secondary | ICD-10-CM

## 2013-11-08 DIAGNOSIS — M961 Postlaminectomy syndrome, not elsewhere classified: Secondary | ICD-10-CM | POA: Insufficient documentation

## 2013-11-08 DIAGNOSIS — G8929 Other chronic pain: Secondary | ICD-10-CM | POA: Insufficient documentation

## 2013-11-08 MED ORDER — MORPHINE SULFATE ER 30 MG PO TBCR: 30.0000 mg | EXTENDED_RELEASE_TABLET | Freq: Three times a day (TID) | ORAL | Status: DC

## 2013-11-08 NOTE — Patient Instructions (Signed)
Try Sportscreme to left hip two to three times a day

## 2013-11-08 NOTE — Progress Notes (Signed)
Subjective:    Patient ID: Cindy Pratt, female    DOB: 06-Sep-1965, 48 y.o.   MRN: [id number]  HPI  Cindy Pratt is here for follow up on her fibromyalgia as well as chronic low back pain. Recent cold/damp weather has made her symptoms worse. The patient complains about chronic low back pain which has worsened this winter and would like to have MS contin increased to tid. She reports that she medication does not last 12 hours and she gets up in excruciating pain in mornings.  She sustained a fall in Jan while holding her grandson and sustained a small fracture in right foot. She did fall onto her left hip and has had left lateral hip pain since then. She is concerned that she might have fractured her hip.She reports achy pain that does not radiate to the groin or thigh but has been persistent.    Chart reviewed and note that patient had been on MS contin 30 mg tid till 06/2012 and had self weaned to lower dose.    Pain Inventory Average Pain 7 Pain Right Now 6 My pain is intermittent, sharp, burning, dull, stabbing, tingling and aching  In the last 24 hours, has pain interfered with the following? General activity 6 Relation with others 6 Enjoyment of life 6 What TIME of day is your pain at its worst? intermittent Sleep (in general) Fair  Pain is worse with: some activites Pain improves with: medication Relief from Meds: 8  Mobility Do you have any goals in this area?  no  Function Do you have any goals in this area?  no  Neuro/Psych numbness tingling confusion depression anxiety  Prior Studies Any changes since last visit?  no  Physicians involved in your care Any changes since last visit?  no   Family History  Problem Relation Age of Onset  . Hypertension Mother   . Hypertension Father   . Breast cancer Maternal Grandmother   . Liver cancer Maternal Grandmother   . Cancer Maternal Grandmother     breast cancer  . Ovarian cancer Cousin   . Cancer Cousin       1st c. maternal side/ cervical cancer  . Diabetes Maternal Grandfather     both sides  . Cancer Cousin     1st c maternal/ colon cancer   History   Social History  . Marital Status: Married    Spouse Name: N/A    Number of Children: 4  . Years of Education: N/A   Occupational History  . disabled    Social History Main Topics  . Smoking status: Former Smoker -- 1.00 packs/day for 20 years    Types: Cigarettes    Quit date: 07/31/2007  . Smokeless tobacco: Never Used  . Alcohol Use: No  . Drug Use: No  . Sexual Activity: Yes    Birth Control/ Protection: Surgical   Other Topics Concern  . None   Social History Narrative  . None   Past Surgical History  Procedure Laterality Date  . Back surgery  1997, 2003  . Abdominal hysterectomy    . Cystectomy    . Diagnostic laparoscopy     Past Medical History  Diagnosis Date  . Fibromyalgia   . Anxiety   . Depression   . Hyperlipidemia   . Asthma   . Degenerative joint disease   . Osteoarthritis   . Belchings   . Lumbar post-laminectomy syndrome   . Lumbosacral neuritis   .  Disturbance of skin sensation   . Sciatica   . Chronic pain syndrome   . Other chronic postoperative pain    BP 107/61  Pulse 80  Resp 14  Ht 5\' 3"  (1.6 m)  Wt 165 lb (74.844 kg)  BMI 29.24 kg/m2  SpO2 98%  Opioid Risk Score:   Fall Risk Score: High Fall Risk (>13 points) (patient educated handout declined)   Review of Systems  Musculoskeletal: Positive for back pain and neck pain.  Neurological: Positive for numbness.  Psychiatric/Behavioral: Positive for confusion and dysphoric mood. The patient is nervous/anxious.   All other systems reviewed and are negative.       Objective:   Physical Exam  Nursing note and vitals reviewed. Constitutional: She is oriented to person, place, and time. She appears well-developed and well-nourished.  HENT:  Head: Normocephalic and atraumatic.  Eyes: Conjunctivae are normal. Pupils are  equal, round, and reactive to light.  Cardiovascular: Normal rate and regular rhythm.   Pulmonary/Chest: Effort normal and breath sounds normal. No respiratory distress. She has no wheezes.  Musculoskeletal: She exhibits no edema.  Decreased ROM of spine. Strength BUE/RLE equal 5/5. Some weakness left iliopsoas  Sensation intact and no clonus noted. No pain with ROM right hip but some pain laterally likely MS related.   Neurological: She is alert and oriented to person, place, and time.  Skin: Skin is warm and dry.          Assessment & Plan:  1.Lumbar postlaminectomy syndrome status post L5-S1 fusion radiating to LLE: back pain exacerbated by fibromyalgia symptoms. Increased MS contin to every 8 hours. We discussed tolerance issues and I would like her to decrease back to bid during the warmer months.  Refilled: MS contin 30 mg # 90 pills--use one pill every 8 hours for pain  2. Left hip pain past fall: question left iliopsoas tendinitis v/s occult fracture. Will check hip films to rule out injury. She has allergies to NSAIDs and sulfa--advised her to try Sportscreme to left hip tid for now.   3. Depression: Reports mood is stable but there are times that she does not want to get out of the house. She was anxious about her hip pain and relayed to her that it sounded MS but would check x rays. Also encouraged to resume outdoor activities as weather is improving.   4. Constipation: She is using colace bid as well as "colon cleansers" occasionally. Advised her to add Senna or miralax daily for bowel program as these are less habit forming.

## 2013-11-21 ENCOUNTER — Ambulatory Visit
Admission: RE | Admit: 2013-11-21 | Discharge: 2013-11-21 | Disposition: A | Discharge status: Home / Self Care | Payer: PRIVATE HEALTH INSURANCE | Source: Ambulatory Visit | Attending: Physical Medicine and Rehabilitation | Admitting: Physical Medicine and Rehabilitation

## 2013-11-21 DIAGNOSIS — M25569 Pain in unspecified knee: Secondary | ICD-10-CM

## 2013-12-10 ENCOUNTER — Encounter: Payer: PRIVATE HEALTH INSURANCE | Attending: Physical Medicine & Rehabilitation | Admitting: Registered Nurse

## 2013-12-10 ENCOUNTER — Encounter: Payer: Self-pay | Admitting: Registered Nurse

## 2013-12-10 VITALS — BP 105/57 | HR 77 | Resp 14 | Ht 63.5 in | Wt 164.0 lb

## 2013-12-10 DIAGNOSIS — M47812 Spondylosis without myelopathy or radiculopathy, cervical region: Secondary | ICD-10-CM | POA: Insufficient documentation

## 2013-12-10 DIAGNOSIS — F3289 Other specified depressive episodes: Secondary | ICD-10-CM | POA: Insufficient documentation

## 2013-12-10 DIAGNOSIS — F329 Major depressive disorder, single episode, unspecified: Secondary | ICD-10-CM | POA: Insufficient documentation

## 2013-12-10 DIAGNOSIS — E785 Hyperlipidemia, unspecified: Secondary | ICD-10-CM | POA: Insufficient documentation

## 2013-12-10 DIAGNOSIS — M25569 Pain in unspecified knee: Secondary | ICD-10-CM

## 2013-12-10 DIAGNOSIS — F411 Generalized anxiety disorder: Secondary | ICD-10-CM | POA: Insufficient documentation

## 2013-12-10 DIAGNOSIS — J45909 Unspecified asthma, uncomplicated: Secondary | ICD-10-CM | POA: Insufficient documentation

## 2013-12-10 DIAGNOSIS — M961 Postlaminectomy syndrome, not elsewhere classified: Secondary | ICD-10-CM

## 2013-12-10 DIAGNOSIS — IMO0001 Reserved for inherently not codable concepts without codable children: Secondary | ICD-10-CM | POA: Insufficient documentation

## 2013-12-10 DIAGNOSIS — M546 Pain in thoracic spine: Secondary | ICD-10-CM | POA: Insufficient documentation

## 2013-12-10 DIAGNOSIS — Z79899 Other long term (current) drug therapy: Secondary | ICD-10-CM | POA: Insufficient documentation

## 2013-12-10 DIAGNOSIS — M797 Fibromyalgia: Secondary | ICD-10-CM

## 2013-12-10 MED ORDER — MORPHINE SULFATE ER 30 MG PO TBCR: 30.0000 mg | EXTENDED_RELEASE_TABLET | Freq: Three times a day (TID) | ORAL | Status: DC

## 2013-12-10 NOTE — Progress Notes (Signed)
Subjective:    Patient ID: Cindy Pratt, female    DOB: May 09, 1966, 48 y.o.   MRN: [id number]  HPI: Cindy Pratt is a 48 year old female who returns for follow up for chronic pain and medication refill. She has no pain at this time, had her medication at 2:00. Earlier today the pain was located at nape of the neck and lower back. She rated her pain a 6 on the form from last night. She admits weather exacerbates her pain level and intensity. Her current exercise regime is walking. She says she doesn't get any relief from heat and ice therapy.   Pain Inventory Average Pain 8 Pain Right Now 6 My pain is sharp, burning, stabbing, tingling and aching  In the last 24 hours, has pain interfered with the following? General activity 7 Relation with others 7 Enjoyment of life 7 What TIME of day is your pain at its worst? morning and night Sleep (in general) Fair  Pain is worse with: walking, bending, sitting, inactivity, standing and some activites Pain improves with: medication Relief from Meds: n/a  Mobility Do you have any goals in this area?  no  Function disabled: date disabled . Do you have any goals in this area?  no  Neuro/Psych tingling dizziness confusion depression anxiety  Prior Studies Any changes since last visit?  no  Physicians involved in your care Any changes since last visit?  no   Family History  Problem Relation Age of Onset  . Hypertension Mother   . Hypertension Father   . Breast cancer Maternal Grandmother   . Liver cancer Maternal Grandmother   . Cancer Maternal Grandmother     breast cancer  . Ovarian cancer Cousin   . Cancer Cousin     1st c. maternal side/ cervical cancer  . Diabetes Maternal Grandfather     both sides  . Cancer Cousin     1st c maternal/ colon cancer   History   Social History  . Marital Status: Married    Spouse Name: N/A    Number of Children: 4  . Years of Education: N/A   Occupational History  .  disabled    Social History Main Topics  . Smoking status: Former Smoker -- 1.00 packs/day for 20 years    Types: Cigarettes    Quit date: 07/31/2007  . Smokeless tobacco: Never Used  . Alcohol Use: No  . Drug Use: No  . Sexual Activity: Yes    Birth Control/ Protection: Surgical   Other Topics Concern  . None   Social History Narrative  . None   Past Surgical History  Procedure Laterality Date  . Back surgery  1997, 2003  . Abdominal hysterectomy    . Cystectomy    . Diagnostic laparoscopy     Past Medical History  Diagnosis Date  . Fibromyalgia   . Anxiety   . Depression   . Hyperlipidemia   . Asthma   . Degenerative joint disease   . Osteoarthritis   . Belchings   . Lumbar post-laminectomy syndrome   . Lumbosacral neuritis   . Disturbance of skin sensation   . Sciatica   . Chronic pain syndrome   . Other chronic postoperative pain    BP 105/57  Pulse 77  Resp 14  Ht 5' 3.5" (1.613 m)  Wt 164 lb (74.39 kg)  BMI 28.59 kg/m2  SpO2 97%  Opioid Risk Score:   Fall Risk Score: Moderate  Fall Risk (6-13 points) (patient educated handout declined)   Review of Systems  Musculoskeletal: Positive for back pain.  Neurological: Positive for dizziness.       Tingling  Psychiatric/Behavioral: Positive for confusion and dysphoric mood. The patient is nervous/anxious.   All other systems reviewed and are negative.      Objective:   Physical Exam  Nursing note and vitals reviewed. Constitutional: She is oriented to person, place, and time. She appears well-developed and well-nourished.  HENT:  Head: Normocephalic.  Neck: Normal range of motion. Neck supple.  Cardiovascular: Normal rate, regular rhythm and normal heart sounds.   Pulmonary/Chest: Effort normal and breath sounds normal.  Musculoskeletal:  Normal Muscle Bulk: Muscle Strength: Upper Extremities 4/5. Lower Extremities 5/5. Spinal Flexion: 45 degrees. Tenderness noted at Lumbar paraspinous muscles.    Neurological: She is alert and oriented to person, place, and time. She has normal reflexes.  Skin: Skin is warm and dry.  Psychiatric: She has a normal mood and affect.          Assessment & Plan:  1.Lumbar postlaminectomy syndrome status post L5-S1 fusion radiating to UJW:[id number]:Refilled: MS contin 30 mg # 90 pills--use one pill every 8 hours for pain  2. Depression: Reports mood is stable. Her Primary discontinued Abilify and started Lamictal. It's been two weeks. Continue to monitor. 4. Constipation: Uses a stool softener or vegatable laxative as needed. Stable, Continue to monitor.

## 2014-01-08 ENCOUNTER — Encounter: Payer: Self-pay | Admitting: Registered Nurse

## 2014-01-08 ENCOUNTER — Other Ambulatory Visit: Payer: Self-pay | Admitting: Physical Medicine & Rehabilitation

## 2014-01-08 ENCOUNTER — Encounter: Payer: PRIVATE HEALTH INSURANCE | Attending: Physical Medicine & Rehabilitation | Admitting: Registered Nurse

## 2014-01-08 VITALS — BP 113/61 | HR 70 | Resp 14 | Ht 63.0 in | Wt 165.0 lb

## 2014-01-08 DIAGNOSIS — Z79899 Other long term (current) drug therapy: Secondary | ICD-10-CM | POA: Insufficient documentation

## 2014-01-08 DIAGNOSIS — M25569 Pain in unspecified knee: Secondary | ICD-10-CM | POA: Insufficient documentation

## 2014-01-08 DIAGNOSIS — M961 Postlaminectomy syndrome, not elsewhere classified: Secondary | ICD-10-CM | POA: Insufficient documentation

## 2014-01-08 DIAGNOSIS — IMO0001 Reserved for inherently not codable concepts without codable children: Secondary | ICD-10-CM | POA: Insufficient documentation

## 2014-01-08 DIAGNOSIS — Z5181 Encounter for therapeutic drug level monitoring: Secondary | ICD-10-CM | POA: Insufficient documentation

## 2014-01-08 MED ORDER — MORPHINE SULFATE ER 30 MG PO TBCR: 30.0000 mg | EXTENDED_RELEASE_TABLET | Freq: Three times a day (TID) | ORAL | Status: DC

## 2014-01-08 NOTE — Progress Notes (Signed)
Subjective:    Patient ID: Cindy Pratt, female    DOB: 1965-11-30, 48 y.o.   MRN: [id number]  HPI: Cindy Pratt is a 48 year old female who returns for follow up for chronic pain and medication refill. She says her pain is in her neck and lower back. She rates her pain 8. Her current exercise regime is walking.  Pain Inventory Average Pain 6 Pain Right Now 8 My pain is sharp, burning, stabbing, tingling and aching  In the last 24 hours, has pain interfered with the following? General activity 8 Relation with others 8 Enjoyment of life 8 What TIME of day is your pain at its worst? all the time Sleep (in general) Fair  Pain is worse with: some activites Pain improves with: medication Relief from Meds: 5  Mobility Do you have any goals in this area?  no  Function Do you have any goals in this area?  no  Neuro/Psych depression anxiety  Prior Studies Any changes since last visit?  no  Physicians involved in your care Any changes since last visit?  no   Family History  Problem Relation Age of Onset  . Hypertension Mother   . Hypertension Father   . Breast cancer Maternal Grandmother   . Liver cancer Maternal Grandmother   . Cancer Maternal Grandmother     breast cancer  . Ovarian cancer Cousin   . Cancer Cousin     1st c. maternal side/ cervical cancer  . Diabetes Maternal Grandfather     both sides  . Cancer Cousin     1st c maternal/ colon cancer   History   Social History  . Marital Status: Married    Spouse Name: N/A    Number of Children: 4  . Years of Education: N/A   Occupational History  . disabled    Social History Main Topics  . Smoking status: Former Smoker -- 1.00 packs/day for 20 years    Types: Cigarettes    Quit date: 07/31/2007  . Smokeless tobacco: Never Used  . Alcohol Use: No  . Drug Use: No  . Sexual Activity: Yes    Birth Control/ Protection: Surgical   Other Topics Concern  . None   Social History Narrative  .  None   Past Surgical History  Procedure Laterality Date  . Back surgery  1997, 2003  . Abdominal hysterectomy    . Cystectomy    . Diagnostic laparoscopy     Past Medical History  Diagnosis Date  . Fibromyalgia   . Anxiety   . Depression   . Hyperlipidemia   . Asthma   . Degenerative joint disease   . Osteoarthritis   . Belchings   . Lumbar post-laminectomy syndrome   . Lumbosacral neuritis   . Disturbance of skin sensation   . Sciatica   . Chronic pain syndrome   . Other chronic postoperative pain    BP 113/61  Pulse 70  Resp 14  Ht 5\' 3"  (1.6 m)  Wt 165 lb (74.844 kg)  BMI 29.24 kg/m2  SpO2 97%  Opioid Risk Score:   Fall Risk Score: Moderate Fall Risk (6-13 points) (patient educated handout declined)   Review of Systems  Musculoskeletal: Positive for back pain and neck pain.  Psychiatric/Behavioral: Positive for dysphoric mood. The patient is nervous/anxious.   All other systems reviewed and are negative.      Objective:   Physical Exam  Nursing note and vitals reviewed.  Constitutional: She is oriented to person, place, and time. She appears well-developed and well-nourished.  HENT:  Head: Normocephalic and atraumatic.  Neck: Normal range of motion. Neck supple.  No Cervical Paraspinal Tenderness  Cardiovascular: Normal rate and regular rhythm.   Pulmonary/Chest: Effort normal.  Musculoskeletal: Normal range of motion.  Normal Muscle Bulk: Muscle Testing Reveals: Upper Extremities and Lower extremities Full ROM and Muscle Strength 5/5. Spine Flexion: 40 degrees Back without spinal or paraspinal tenderness noted. Narrow Based Gait. Arises from Chair with ease   Neurological: She is alert and oriented to person, place, and time.  Skin: Skin is warm and dry.  Psychiatric: She has a normal mood and affect.          Assessment & Plan:  1.Lumbar postlaminectomy syndrome status post L5-S1 fusion radiating to ZOX:WRUEAVWULLE:Refilled: MS contin 30 mg # 90  pills--use one pill every 8 hours for pain   2. Depression: Continue Current Medication Regimen.  15 minutes of face to face patient care time was spent during this visit. All questions were encouraged and answered.  F/U in 1 month.

## 2014-01-10 ENCOUNTER — Telehealth: Payer: Self-pay

## 2014-01-10 NOTE — Telephone Encounter (Signed)
Patient states that her PCP wants to try her on Xanax or Clonazepam. The PCP needs a letter from Dr. Wynn BankerKirsteins stating this is okay if so  Faxed to [phone].

## 2014-01-10 NOTE — Telephone Encounter (Signed)
About 20% of the overdose population has both medications like morphine and medications like sand next in her system. Therefore I do not recommend this combination. Recommend psychiatry evaluation

## 2014-01-11 ENCOUNTER — Telehealth: Payer: Self-pay

## 2014-01-11 NOTE — Telephone Encounter (Signed)
Patient returned call. Informed patient that Dr. Wynn BankerKirsteins does not recommend taking Xanax or Clonazepam with the Morphine. Patient states that her PCP is also a psychiatrist. Advised patient to see if her PCP would send the evaluation to Dr. Wynn BankerKirsteins.

## 2014-01-11 NOTE — Telephone Encounter (Signed)
Attempted to contact patient. Left a voicemail to return call to clinic.  

## 2014-01-25 NOTE — Progress Notes (Signed)
Urine drug screen form 01/08/2014 was consistent.

## 2014-02-05 ENCOUNTER — Encounter: Payer: PRIVATE HEALTH INSURANCE | Attending: Physical Medicine & Rehabilitation | Admitting: Registered Nurse

## 2014-02-05 DIAGNOSIS — Z5181 Encounter for therapeutic drug level monitoring: Secondary | ICD-10-CM | POA: Insufficient documentation

## 2014-02-05 DIAGNOSIS — IMO0001 Reserved for inherently not codable concepts without codable children: Secondary | ICD-10-CM | POA: Insufficient documentation

## 2014-02-05 DIAGNOSIS — M25569 Pain in unspecified knee: Secondary | ICD-10-CM | POA: Insufficient documentation

## 2014-02-05 DIAGNOSIS — Z79899 Other long term (current) drug therapy: Secondary | ICD-10-CM | POA: Insufficient documentation

## 2014-02-05 DIAGNOSIS — M961 Postlaminectomy syndrome, not elsewhere classified: Secondary | ICD-10-CM | POA: Insufficient documentation

## 2014-02-14 ENCOUNTER — Encounter: Payer: PRIVATE HEALTH INSURANCE | Admitting: Registered Nurse

## 2014-02-15 ENCOUNTER — Encounter: Payer: Self-pay | Admitting: Registered Nurse

## 2014-02-15 ENCOUNTER — Encounter (HOSPITAL_BASED_OUTPATIENT_CLINIC_OR_DEPARTMENT_OTHER): Payer: PRIVATE HEALTH INSURANCE | Admitting: Registered Nurse

## 2014-02-15 VITALS — BP 114/73 | HR 70 | Resp 14 | Ht 63.0 in | Wt 160.0 lb

## 2014-02-15 DIAGNOSIS — Z79899 Other long term (current) drug therapy: Secondary | ICD-10-CM

## 2014-02-15 DIAGNOSIS — Z5181 Encounter for therapeutic drug level monitoring: Secondary | ICD-10-CM

## 2014-02-15 DIAGNOSIS — IMO0001 Reserved for inherently not codable concepts without codable children: Secondary | ICD-10-CM

## 2014-02-15 DIAGNOSIS — M961 Postlaminectomy syndrome, not elsewhere classified: Secondary | ICD-10-CM

## 2014-02-15 MED ORDER — TIZANIDINE HCL 4 MG PO TABS: 4.0000 mg | ORAL_TABLET | Freq: Two times a day (BID) | ORAL | Status: DC

## 2014-02-15 MED ORDER — MORPHINE SULFATE ER 30 MG PO TBCR: 30.0000 mg | EXTENDED_RELEASE_TABLET | Freq: Three times a day (TID) | ORAL | Status: DC

## 2014-02-15 NOTE — Progress Notes (Signed)
Subjective:    Patient ID: Cindy Pratt, female    DOB: [birth year], 48 y.o.   MRN: [id number]  HPI: Ms Cindy JubileeLori A Pratt is a 48 year old female who returns for follow up for chronic pain and medication refill. She says her pain is in her lower back and bilateral thighs. She rates her pain 8. Her current exercise regime is walking.   Pain Inventory Average Pain 8 Pain Right Now 6 My pain is constant, sharp, burning, dull, stabbing, tingling and aching  In the last 24 hours, has pain interfered with the following? General activity 8 Relation with others 8 Enjoyment of life 8 What TIME of day is your pain at its worst? constant all day Sleep (in general) Fair  Pain is worse with: na Pain improves with: na Relief from Meds: 7  Mobility walk without assistance transfers alone Do you have any goals in this area?  no  Function Do you have any goals in this area?  no  Neuro/Psych weakness tingling dizziness depression anxiety  Prior Studies Any changes since last visit?  no  Physicians involved in your care Any changes since last visit?  no   Family History  Problem Relation Age of Onset  . Hypertension Mother   . Hypertension Father   . Breast cancer Maternal Grandmother   . Liver cancer Maternal Grandmother   . Cancer Maternal Grandmother     breast cancer  . Ovarian cancer Cousin   . Cancer Cousin     1st c. maternal side/ cervical cancer  . Diabetes Maternal Grandfather     both sides  . Cancer Cousin     1st c maternal/ colon cancer   History   Social History  . Marital Status: Married    Spouse Name: N/A    Number of Children: 4  . Years of Education: N/A   Occupational History  . disabled    Social History Main Topics  . Smoking status: Former Smoker -- 1.00 packs/day for 20 years    Types: Cigarettes    Quit date: 07/31/2007  . Smokeless tobacco: Never Used  . Alcohol Use: No  . Drug Use: No  . Sexual Activity: Yes    Birth Control/  Protection: Surgical   Other Topics Concern  . None   Social History Narrative  . None   Past Surgical History  Procedure Laterality Date  . Back surgery  1997, 2003  . Abdominal hysterectomy    . Cystectomy    . Diagnostic laparoscopy     Past Medical History  Diagnosis Date  . Fibromyalgia   . Anxiety   . Depression   . Hyperlipidemia   . Asthma   . Degenerative joint disease   . Osteoarthritis   . Belchings   . Lumbar post-laminectomy syndrome   . Lumbosacral neuritis   . Disturbance of skin sensation   . Sciatica   . Chronic pain syndrome   . Other chronic postoperative pain    BP 114/73  Pulse 70  Resp 14  Ht 5\' 3"  (1.6 m)  Wt 160 lb (72.576 kg)  BMI 28.35 kg/m2  SpO2 94%  Opioid Risk Score:   Fall Risk Score: Moderate Fall Risk (6-13 points) (pt educated on fall risk, brochure given to pt previously)   Review of Systems  Musculoskeletal: Positive for back pain and neck pain.  Neurological: Positive for dizziness and weakness.       Tingling  Psychiatric/Behavioral: The patient  is nervous/anxious.        Depression  All other systems reviewed and are negative.      Objective:   Physical Exam  Nursing note and vitals reviewed. Constitutional: She is oriented to person, place, and time. She appears well-developed and well-nourished.  HENT:  Head: Normocephalic and atraumatic.  Neck: Normal range of motion. Neck supple.  Cardiovascular: Normal rate, regular rhythm and normal heart sounds.   Pulmonary/Chest: Effort normal and breath sounds normal.  Musculoskeletal:  Normal Muscle Bulk and Muscle testing Reveals: Upper Extremities: Full ROM and Muscle strength 5/5 Spine Forward Flexion 45 degrees Lumbar Paraspinal Tenderness: L-3- L-5 Lower extremities: Right Leg Flexion Produces pain into Lumbar  Left Leg: Full ROM Arises from chair with ease Narrow based gait  Neurological: She is alert and oriented to person, place, and time.  Skin: Skin is  warm and dry.          Assessment & Plan:  1.Lumbar postlaminectomy syndrome status post L5-S1 fusion radiating to ZOX:WRUEAVWULLE:Refilled: MS contin 30 mg # 90 pills--use one pill every 8 hours for pain  2. Depression: Continue Current Medication Regimen of Prozac.  15 minutes of face to face patient care time was spent during this visit. All questions were encouraged and answered.   F/U in 1 month.

## 2014-03-12 ENCOUNTER — Encounter: Payer: Self-pay | Admitting: Registered Nurse

## 2014-03-12 ENCOUNTER — Encounter: Payer: PRIVATE HEALTH INSURANCE | Attending: Physical Medicine & Rehabilitation | Admitting: Registered Nurse

## 2014-03-12 VITALS — BP 121/77 | HR 82 | Resp 14 | Ht 63.0 in | Wt 159.0 lb

## 2014-03-12 DIAGNOSIS — Z79899 Other long term (current) drug therapy: Secondary | ICD-10-CM | POA: Insufficient documentation

## 2014-03-12 DIAGNOSIS — IMO0001 Reserved for inherently not codable concepts without codable children: Secondary | ICD-10-CM | POA: Insufficient documentation

## 2014-03-12 DIAGNOSIS — M25569 Pain in unspecified knee: Secondary | ICD-10-CM | POA: Insufficient documentation

## 2014-03-12 DIAGNOSIS — M961 Postlaminectomy syndrome, not elsewhere classified: Secondary | ICD-10-CM | POA: Diagnosis not present

## 2014-03-12 DIAGNOSIS — Z5181 Encounter for therapeutic drug level monitoring: Secondary | ICD-10-CM | POA: Diagnosis present

## 2014-03-12 MED ORDER — MORPHINE SULFATE ER 30 MG PO TBCR: 30.0000 mg | EXTENDED_RELEASE_TABLET | Freq: Three times a day (TID) | ORAL | Status: DC

## 2014-03-12 NOTE — Progress Notes (Signed)
Subjective:    Patient ID: Cindy Pratt, female    DOB: [birth year], 48 y.o.   MRN: [id number]  HPI: [name] is a 48 year old female who returns for follow up for chronic pain and medication refill. She says her pain is located all over. She says for the last week she has been having a lot of Fibro pain, Anxiety and Depression. She denies any new stressors at this time. Her current exercise regime is walking.  Pain Inventory Average Pain 9 Pain Right Now 8 My pain is sharp, burning, dull, stabbing, tingling and aching  In the last 24 hours, has pain interfered with the following? General activity 8 Relation with others 8 Enjoyment of life 8 What TIME of day is your pain at its worst? constant Sleep (in general) Fair  Pain is worse with: some activites Pain improves with: medication Relief from Meds: 7  Mobility Do you have any goals in this area?  no  Function Do you have any goals in this area?  no  Neuro/Psych confusion depression anxiety  Prior Studies Any changes since last visit?  no  Physicians involved in your care Any changes since last visit?  no   Family History  Problem Relation Age of Onset  . Hypertension Mother   . Hypertension Father   . Breast cancer Maternal Grandmother   . Liver cancer Maternal Grandmother   . Cancer Maternal Grandmother     breast cancer  . Ovarian cancer Cousin   . Cancer Cousin     1st c. maternal side/ cervical cancer  . Diabetes Maternal Grandfather     both sides  . Cancer Cousin     1st c maternal/ colon cancer   History   Social History  . Marital Status: Married    Spouse Name: N/A    Number of Children: 4  . Years of Education: N/A   Occupational History  . disabled    Social History Main Topics  . Smoking status: Former Smoker -- 1.00 packs/day for 20 years    Types: Cigarettes    Quit date: 07/31/2007  . Smokeless tobacco: Never Used  . Alcohol Use: No  . Drug Use: No  . Sexual  Activity: Yes    Birth Control/ Protection: Surgical   Other Topics Concern  . None   Social History Narrative  . None   Past Surgical History  Procedure Laterality Date  . Back surgery  1997, 2003  . Abdominal hysterectomy    . Cystectomy    . Diagnostic laparoscopy     Past Medical History  Diagnosis Date  . Fibromyalgia   . Anxiety   . Depression   . Hyperlipidemia   . Asthma   . Degenerative joint disease   . Osteoarthritis   . Belchings   . Lumbar post-laminectomy syndrome   . Lumbosacral neuritis   . Disturbance of skin sensation   . Sciatica   . Chronic pain syndrome   . Other chronic postoperative pain    BP 121/77  Pulse 82  Resp 14  Ht 5\' 3"  (1.6 m)  Wt 159 lb (72.122 kg)  BMI 28.17 kg/m2  SpO2 96%  Opioid Risk Score:   Fall Risk Score: Low Fall Risk (0-5 points) (patient educated handout declined)   Review of Systems  Musculoskeletal: Positive for arthralgias and myalgias.  Psychiatric/Behavioral: Positive for confusion and dysphoric mood. The patient is nervous/anxious.   All other systems reviewed and  are negative.      Objective:   Physical Exam  Nursing note and vitals reviewed. Constitutional: She is oriented to person, place, and time. She appears well-developed and well-nourished.  HENT:  Head: Normocephalic and atraumatic.  Neck: Normal range of motion. Neck supple.  Cervical Paraspinal Tenderness: C-3- C-5  Cardiovascular: Normal rate and regular rhythm.   Pulmonary/Chest: Effort normal and breath sounds normal.  Musculoskeletal:  Normal Muscle Bulk and Muscle Testing Reveals: Upper Extremities: Full ROM and Muscle strength 4/5. Bilateral AC Joint Tenderness Bilateral Spine of Scapula Tenderness Thoracic and Lumbar Hypersensitivity Lower Extremities: Full ROM and Muscle Strength 5/5 Arises from chair with ease. Narrow Based Gait.   Neurological: She is alert and oriented to person, place, and time.  Skin: Skin is warm and  dry.  Psychiatric: She has a normal mood and affect.          Assessment & Plan:  1.Lumbar postlaminectomy syndrome status post L5-S1 fusion radiating to UJW:JXBJYNWGLLE:Refilled: MS contin 30 mg # 90 pills--use one pill every 8 hours for pain  2. Depression: Continue Current Medication Regimen of Prozac.   15 minutes of face to face patient care time was spent during this visit. All questions were encouraged and answered.   F/U in 1 month.

## 2014-04-10 ENCOUNTER — Encounter: Payer: PRIVATE HEALTH INSURANCE | Attending: Physical Medicine & Rehabilitation | Admitting: Registered Nurse

## 2014-04-10 ENCOUNTER — Encounter: Payer: Self-pay | Admitting: Registered Nurse

## 2014-04-10 VITALS — BP 119/53 | HR 92 | Resp 14 | Ht 63.0 in | Wt 159.0 lb

## 2014-04-10 DIAGNOSIS — Z5181 Encounter for therapeutic drug level monitoring: Secondary | ICD-10-CM

## 2014-04-10 DIAGNOSIS — IMO0001 Reserved for inherently not codable concepts without codable children: Secondary | ICD-10-CM | POA: Insufficient documentation

## 2014-04-10 DIAGNOSIS — M25569 Pain in unspecified knee: Secondary | ICD-10-CM | POA: Insufficient documentation

## 2014-04-10 DIAGNOSIS — M961 Postlaminectomy syndrome, not elsewhere classified: Secondary | ICD-10-CM | POA: Diagnosis present

## 2014-04-10 DIAGNOSIS — Z79899 Other long term (current) drug therapy: Secondary | ICD-10-CM | POA: Insufficient documentation

## 2014-04-10 MED ORDER — MORPHINE SULFATE ER 30 MG PO TBCR: 30.0000 mg | EXTENDED_RELEASE_TABLET | Freq: Three times a day (TID) | ORAL | Status: DC

## 2014-04-10 NOTE — Progress Notes (Signed)
Subjective:    Patient ID: Cindy Pratt, female    DOB: [birth year], 48 y.o.   MRN: [id number]  HPI: [name] is a 48 year old female who returns for follow up for chronic pain and medication refill. She says her pain is located in her neck and lower back. At times she has right hand pain.  She rates her pain 7. Her current exercise regime is walking. She states she is under a lot of stress. She is concerned about her daughter. Hoping her daughter will be able to caring the baby full term. Emotional support given. Husband in the room.  Pain Inventory Average Pain 8 Pain Right Now 7 My pain is intermittent, sharp, burning, dull, stabbing, tingling and aching  In the last 24 hours, has pain interfered with the following? General activity 7 Relation with others 8 Enjoyment of life 8 What TIME of day is your pain at its worst? constant all day Sleep (in general) Fair  Pain is worse with: na Pain improves with: alternating positions Relief from Meds: 7  Mobility Do you have any goals in this area?  no  Function Do you have any goals in this area?  no  Neuro/Psych weakness confusion depression anxiety  Prior Studies Any changes since last visit?  no  Physicians involved in your care Any changes since last visit?  no   Family History  Problem Relation Age of Onset  . Hypertension Mother   . Hypertension Father   . Breast cancer Maternal Grandmother   . Liver cancer Maternal Grandmother   . Cancer Maternal Grandmother     breast cancer  . Ovarian cancer Cousin   . Cancer Cousin     1st c. maternal side/ cervical cancer  . Diabetes Maternal Grandfather     both sides  . Cancer Cousin     1st c maternal/ colon cancer   History   Social History  . Marital Status: Married    Spouse Name: N/A    Number of Children: 4  . Years of Education: N/A   Occupational History  . disabled    Social History Main Topics  . Smoking status: Former Smoker -- 1.00  packs/day for 20 years    Types: Cigarettes    Quit date: 07/31/2007  . Smokeless tobacco: Never Used  . Alcohol Use: No  . Drug Use: No  . Sexual Activity: Yes    Birth Control/ Protection: Surgical   Other Topics Concern  . None   Social History Narrative  . None   Past Surgical History  Procedure Laterality Date  . Back surgery  1997, 2003  . Abdominal hysterectomy    . Cystectomy    . Diagnostic laparoscopy     Past Medical History  Diagnosis Date  . Fibromyalgia   . Anxiety   . Depression   . Hyperlipidemia   . Asthma   . Degenerative joint disease   . Osteoarthritis   . Belchings   . Lumbar post-laminectomy syndrome   . Lumbosacral neuritis   . Disturbance of skin sensation   . Sciatica   . Chronic pain syndrome   . Other chronic postoperative pain    There were no vitals taken for this visit.  Opioid Risk Score:   Fall Risk Score:      Review of Systems  Musculoskeletal: Positive for back pain and neck pain.  Psychiatric/Behavioral: Positive for confusion. The patient is nervous/anxious.  Depression  All other systems reviewed and are negative.      Objective:   Physical Exam  Nursing note and vitals reviewed. Constitutional: She is oriented to person, place, and time. She appears well-developed and well-nourished.  HENT:  Head: Normocephalic and atraumatic.  Neck: Normal range of motion. Neck supple.  Cardiovascular: Normal rate, regular rhythm and normal heart sounds.   Pulmonary/Chest: Effort normal and breath sounds normal.  Musculoskeletal:  Normal Muscle Bulk and Muscle testing Reveals: Upper extremities: Full ROM and Muscle strength 5/5 On the Left and the Right 4/5 Thoracic Hypersensitivity Lower Extremities: Full ROM and Muscle strength 5/5 Bilateral Lower Extremities Flexion Produces pain into lower Extremities. Arises from chair with ease Narrow Based Gait  Neurological: She is alert and oriented to person, place, and  time.  Skin: Skin is warm and dry.  Psychiatric: She has a normal mood and affect.          Assessment & Plan:  1.Lumbar postlaminectomy syndrome status post L5-S1 fusion radiating to ZOX:WRUEAVWU: MS contin 30 mg # 90 pills--use one pill every 8 hours for pain  2. Depression: Continue Current Medication Regimen of Prozac.   15 minutes of face to face patient care time was spent during this visit. All questions were encouraged and answered.   F/U in 1 month.

## 2014-04-29 ENCOUNTER — Other Ambulatory Visit: Payer: Self-pay

## 2014-04-29 DIAGNOSIS — Z1231 Encounter for screening mammogram for malignant neoplasm of breast: Secondary | ICD-10-CM

## 2014-05-08 ENCOUNTER — Ambulatory Visit: Payer: PRIVATE HEALTH INSURANCE

## 2014-05-09 ENCOUNTER — Encounter: Payer: Self-pay | Admitting: Registered Nurse

## 2014-05-09 ENCOUNTER — Encounter: Payer: PRIVATE HEALTH INSURANCE | Attending: Registered Nurse | Admitting: Registered Nurse

## 2014-05-09 VITALS — BP 121/71 | HR 74 | Resp 14 | Wt 157.8 lb

## 2014-05-09 DIAGNOSIS — Z5181 Encounter for therapeutic drug level monitoring: Secondary | ICD-10-CM | POA: Diagnosis present

## 2014-05-09 DIAGNOSIS — Z79899 Other long term (current) drug therapy: Secondary | ICD-10-CM | POA: Diagnosis present

## 2014-05-09 DIAGNOSIS — M25569 Pain in unspecified knee: Secondary | ICD-10-CM | POA: Insufficient documentation

## 2014-05-09 DIAGNOSIS — IMO0001 Reserved for inherently not codable concepts without codable children: Secondary | ICD-10-CM | POA: Insufficient documentation

## 2014-05-09 DIAGNOSIS — M961 Postlaminectomy syndrome, not elsewhere classified: Secondary | ICD-10-CM | POA: Diagnosis not present

## 2014-05-09 MED ORDER — MORPHINE SULFATE ER 30 MG PO TBCR: 30.0000 mg | EXTENDED_RELEASE_TABLET | Freq: Three times a day (TID) | ORAL | Status: DC

## 2014-05-09 NOTE — Progress Notes (Signed)
Subjective:    Patient ID: Cindy Pratt, female    DOB: [birth year], 48 y.o.   MRN: [id number]  HPI: [name] is a 48 year old female who returns for follow up for chronic pain and medication refill. She says her pain is located in her neck, lower back and left foot. She rates her pain 6. Her current exercise regime is walking.   Pain Inventory Average Pain 8 Pain Right Now 6 My pain is intermittent, sharp, burning, dull, stabbing, tingling and aching  In the last 24 hours, has pain interfered with the following? General activity 7 Relation with others 7 Enjoyment of life 7 What TIME of day is your pain at its worst? varies Sleep (in general) Fair  Pain is worse with: some activites Pain improves with: medication Relief from Meds: 7  Mobility walk without assistance  Function Do you have any goals in this area?  no  Neuro/Psych numbness tingling confusion depression anxiety  Prior Studies Any changes since last visit?  no  Physicians involved in your care Any changes since last visit?  no   Family History  Problem Relation Age of Onset  . Hypertension Mother   . Hypertension Father   . Breast cancer Maternal Grandmother   . Liver cancer Maternal Grandmother   . Cancer Maternal Grandmother     breast cancer  . Ovarian cancer Cousin   . Cancer Cousin     1st c. maternal side/ cervical cancer  . Diabetes Maternal Grandfather     both sides  . Cancer Cousin     1st c maternal/ colon cancer   History   Social History  . Marital Status: Married    Spouse Name: N/A    Number of Children: 4  . Years of Education: N/A   Occupational History  . disabled    Social History Main Topics  . Smoking status: Former Smoker -- 1.00 packs/day for 20 years    Types: Cigarettes    Quit date: 07/31/2007  . Smokeless tobacco: Never Used  . Alcohol Use: No  . Drug Use: No  . Sexual Activity: Yes    Birth Control/ Protection: Surgical   Other Topics  Concern  . None   Social History Narrative  . None   Past Surgical History  Procedure Laterality Date  . Back surgery  1997, 2003  . Abdominal hysterectomy    . Cystectomy    . Diagnostic laparoscopy     Past Medical History  Diagnosis Date  . Fibromyalgia   . Anxiety   . Depression   . Hyperlipidemia   . Asthma   . Degenerative joint disease   . Osteoarthritis   . Belchings   . Lumbar post-laminectomy syndrome   . Lumbosacral neuritis   . Disturbance of skin sensation   . Sciatica   . Chronic pain syndrome   . Other chronic postoperative pain    BP 121/71  Pulse 74  Resp 14  Wt 157 lb 12.8 oz (71.578 kg)  SpO2 97%  Opioid Risk Score:   Fall Risk Score: Low Fall Risk (0-5 points) (previoulsy educated ) Review of Systems  Musculoskeletal: Positive for back pain.  Neurological: Positive for numbness.       Tingling  Psychiatric/Behavioral: Positive for confusion and dysphoric mood. The patient is nervous/anxious.   All other systems reviewed and are negative.      Objective:   Physical Exam  Nursing note and vitals reviewed.  Constitutional: She is oriented to person, place, and time. She appears well-developed and well-nourished.  HENT:  Head: Normocephalic and atraumatic.  Neck: Normal range of motion. Neck supple.  Cardiovascular: Normal rate and regular rhythm.   Pulmonary/Chest: Effort normal and breath sounds normal.  Musculoskeletal:  Normal Muscle Bulk and Muscle testing Reveals: Upper Extremities Full ROM and Muscle Strength 5/5 Back without Spinal or Paraspinal Tenderness Lower Extremities: Full ROM and Muscle Strength 5/5 Right Leg Flexion Produces Pain into Right Groin Arises from chair with ease Narrow based gait  Neurological: She is alert and oriented to person, place, and time.  Skin: Skin is warm and dry.  Psychiatric: She has a normal mood and affect.          Assessment & Plan:  1.Lumbar postlaminectomy syndrome status post  L5-S1 fusion radiating to ZOX:WRUEAVWU: MS contin 30 mg # 90 pills--use one pill every 8 hours for pain  2. Depression: Continue Current Medication Regimen of Prozac.  15 minutes of face to face patient care time was spent during this visit. All questions were encouraged and answered.   F/U in 1 month.

## 2014-05-14 ENCOUNTER — Ambulatory Visit: Payer: PRIVATE HEALTH INSURANCE | Admitting: Physical Medicine & Rehabilitation

## 2014-05-16 ENCOUNTER — Ambulatory Visit
Admission: RE | Admit: 2014-05-16 | Discharge: 2014-05-16 | Disposition: A | Discharge status: Home / Self Care | Payer: PRIVATE HEALTH INSURANCE | Source: Ambulatory Visit

## 2014-05-16 DIAGNOSIS — Z1231 Encounter for screening mammogram for malignant neoplasm of breast: Secondary | ICD-10-CM

## 2014-05-17 ENCOUNTER — Other Ambulatory Visit: Payer: Self-pay | Admitting: Nurse Practitioner

## 2014-05-17 DIAGNOSIS — R928 Other abnormal and inconclusive findings on diagnostic imaging of breast: Secondary | ICD-10-CM

## 2014-05-27 ENCOUNTER — Ambulatory Visit
Admission: RE | Admit: 2014-05-27 | Discharge: 2014-05-27 | Disposition: A | Discharge status: Home / Self Care | Payer: PRIVATE HEALTH INSURANCE | Source: Ambulatory Visit | Attending: Nurse Practitioner | Admitting: Nurse Practitioner

## 2014-05-27 DIAGNOSIS — R928 Other abnormal and inconclusive findings on diagnostic imaging of breast: Secondary | ICD-10-CM

## 2014-06-06 ENCOUNTER — Ambulatory Visit (HOSPITAL_BASED_OUTPATIENT_CLINIC_OR_DEPARTMENT_OTHER): Payer: PRIVATE HEALTH INSURANCE | Admitting: Physical Medicine & Rehabilitation

## 2014-06-06 ENCOUNTER — Encounter: Payer: Self-pay | Admitting: Physical Medicine & Rehabilitation

## 2014-06-06 ENCOUNTER — Encounter: Payer: PRIVATE HEALTH INSURANCE | Attending: Registered Nurse

## 2014-06-06 VITALS — BP 115/58 | HR 70 | Resp 14 | Ht 63.0 in | Wt 157.0 lb

## 2014-06-06 DIAGNOSIS — M797 Fibromyalgia: Secondary | ICD-10-CM | POA: Insufficient documentation

## 2014-06-06 DIAGNOSIS — G894 Chronic pain syndrome: Secondary | ICD-10-CM | POA: Insufficient documentation

## 2014-06-06 DIAGNOSIS — M961 Postlaminectomy syndrome, not elsewhere classified: Secondary | ICD-10-CM | POA: Diagnosis not present

## 2014-06-06 DIAGNOSIS — Z79899 Other long term (current) drug therapy: Secondary | ICD-10-CM | POA: Diagnosis not present

## 2014-06-06 DIAGNOSIS — Z5181 Encounter for therapeutic drug level monitoring: Secondary | ICD-10-CM | POA: Insufficient documentation

## 2014-06-06 MED ORDER — MORPHINE SULFATE ER 30 MG PO TBCR: 30.0000 mg | EXTENDED_RELEASE_TABLET | Freq: Three times a day (TID) | ORAL | Status: DC

## 2014-06-06 NOTE — Patient Instructions (Signed)
Discussed walking 10 minutes 3 times a day, every other day. If this is tolerated then walked 10 minutes 3 times a day every day

## 2014-06-06 NOTE — Progress Notes (Signed)
Subjective:    Patient ID: Cindy Pratt, female    DOB: October 06, 1965, 48 y.o.   MRN: [id number]  HPI  Sees PCP, has been diagnosed with bipolar d/o, diagnosis was about 4 months ago. Feels like she is doing better on the medications such as Lamictal, Prozac and Wellbutrin  Continues on morphine for chronic lumbar postlaminectomy syndrome with history of L5-S1 fusion. No new medical issues.  Has a new PCP Dr. Toni Arthurs  Pain Inventory Average Pain 7 Pain Right Now 8 My pain is intermittent, sharp, burning, dull and stabbing  In the last 24 hours, has pain interfered with the following? General activity 7 Relation with others 7 Enjoyment of life 7 What TIME of day is your pain at its worst? All Sleep (in general) Fair  Pain is worse with: inactivity Pain improves with: medication Relief from Meds: 3  Mobility walk without assistance  Function disabled: date disabled .  Neuro/Psych weakness numbness confusion depression anxiety  Prior Studies Any changes since last visit?  no  Physicians involved in your care Any changes since last visit?  no   Family History  Problem Relation Age of Onset  . Hypertension Mother   . Hypertension Father   . Breast cancer Maternal Grandmother   . Liver cancer Maternal Grandmother   . Cancer Maternal Grandmother     breast cancer  . Ovarian cancer Cousin   . Cancer Cousin     1st c. maternal side/ cervical cancer  . Diabetes Maternal Grandfather     both sides  . Cancer Cousin     1st c maternal/ colon cancer   History   Social History  . Marital Status: Married    Spouse Name: N/A    Number of Children: 4  . Years of Education: N/A   Occupational History  . disabled    Social History Main Topics  . Smoking status: Former Smoker -- 1.00 packs/day for 20 years    Types: Cigarettes    Quit date: 07/31/2007  . Smokeless tobacco: Never Used  . Alcohol Use: No  . Drug Use: No  . Sexual Activity: Yes    Birth  Control/ Protection: Surgical   Other Topics Concern  . None   Social History Narrative  . None   Past Surgical History  Procedure Laterality Date  . Back surgery  1997, 2003  . Abdominal hysterectomy    . Cystectomy    . Diagnostic laparoscopy     Past Medical History  Diagnosis Date  . Fibromyalgia   . Anxiety   . Depression   . Hyperlipidemia   . Asthma   . Degenerative joint disease   . Osteoarthritis   . Belchings   . Lumbar post-laminectomy syndrome   . Lumbosacral neuritis   . Disturbance of skin sensation   . Sciatica   . Chronic pain syndrome   . Other chronic postoperative pain    BP 115/58  Pulse 70  Resp 14  Ht 5\' 3"  (1.6 m)  Wt 157 lb (71.215 kg)  BMI 27.82 kg/m2  SpO2 98%  Opioid Risk Score:   Fall Risk Score: Low Fall Risk (0-5 points)    Review of Systems     Objective:   Physical Exam Pain to palpation around the L4-L5 paraspinal muscles. Able to flex forward possibly 50% range of motion Extension limited to 25% range of motion Straight leg raising test Deep tendon reflexes 2+ bilateral knees and the right ankle, 0  at the left ankle  Fibromyalgia tender points 0/18 General no acute distress Mood and affect are appropriate Ambulation without evidence of toe drag or knee instability       Assessment & Plan:  1.Lumbar postlaminectomy syndrome status post L5-S1 fusion radiating to WUJ:WJXBJYNWLLE:Refilled: MS contin 30 mg # 90 pills--use one pill every 8 hours for pain  2. Depression: Continue Current Medication Regimen of Prozac.  15 minutes of face to face patient care time was spent during this visit. All questions were encouraged and answered.  3. Fibromyalgia syndrome doing well in regards to this. Likely getting benefit from the Prozac as well as Wellbutrin. F/U in 1 month, NP.

## 2014-07-04 ENCOUNTER — Encounter: Payer: Self-pay | Admitting: Registered Nurse

## 2014-07-04 ENCOUNTER — Encounter: Payer: PRIVATE HEALTH INSURANCE | Attending: Registered Nurse | Admitting: Registered Nurse

## 2014-07-04 VITALS — BP 106/70 | HR 70 | Resp 14 | Ht 64.0 in | Wt 156.0 lb

## 2014-07-04 DIAGNOSIS — Y838 Other surgical procedures as the cause of abnormal reaction of the patient, or of later complication, without mention of misadventure at the time of the procedure: Secondary | ICD-10-CM | POA: Diagnosis not present

## 2014-07-04 DIAGNOSIS — M1990 Unspecified osteoarthritis, unspecified site: Secondary | ICD-10-CM | POA: Diagnosis not present

## 2014-07-04 DIAGNOSIS — J45909 Unspecified asthma, uncomplicated: Secondary | ICD-10-CM | POA: Insufficient documentation

## 2014-07-04 DIAGNOSIS — Z87891 Personal history of nicotine dependence: Secondary | ICD-10-CM | POA: Insufficient documentation

## 2014-07-04 DIAGNOSIS — Z5181 Encounter for therapeutic drug level monitoring: Secondary | ICD-10-CM

## 2014-07-04 DIAGNOSIS — M961 Postlaminectomy syndrome, not elsewhere classified: Secondary | ICD-10-CM | POA: Insufficient documentation

## 2014-07-04 DIAGNOSIS — F329 Major depressive disorder, single episode, unspecified: Secondary | ICD-10-CM | POA: Insufficient documentation

## 2014-07-04 DIAGNOSIS — Z981 Arthrodesis status: Secondary | ICD-10-CM | POA: Diagnosis not present

## 2014-07-04 DIAGNOSIS — G609 Hereditary and idiopathic neuropathy, unspecified: Secondary | ICD-10-CM

## 2014-07-04 DIAGNOSIS — Z79899 Other long term (current) drug therapy: Secondary | ICD-10-CM

## 2014-07-04 DIAGNOSIS — E785 Hyperlipidemia, unspecified: Secondary | ICD-10-CM | POA: Diagnosis not present

## 2014-07-04 DIAGNOSIS — G629 Polyneuropathy, unspecified: Secondary | ICD-10-CM | POA: Insufficient documentation

## 2014-07-04 DIAGNOSIS — M797 Fibromyalgia: Secondary | ICD-10-CM

## 2014-07-04 DIAGNOSIS — G894 Chronic pain syndrome: Secondary | ICD-10-CM

## 2014-07-04 DIAGNOSIS — G8929 Other chronic pain: Secondary | ICD-10-CM | POA: Diagnosis present

## 2014-07-04 MED ORDER — MORPHINE SULFATE ER 30 MG PO TBCR: 30.0000 mg | EXTENDED_RELEASE_TABLET | Freq: Three times a day (TID) | ORAL | Status: DC

## 2014-07-04 MED ORDER — TIZANIDINE HCL 4 MG PO TABS: 4.0000 mg | ORAL_TABLET | Freq: Two times a day (BID) | ORAL | Status: DC

## 2014-07-04 NOTE — Progress Notes (Signed)
Subjective:    Patient ID: Cindy Pratt, female    DOB: 09/22/1965, 48 y.o.   MRN: [id number]  HPI: Ms. Cindy JubileeLori A Pratt is a 48 year old female who returns for follow up for chronic pain and medication refill. She says her pain is located in her neck, lower back and left foot. Also having " Fibro"pain. She states" she is having shooting pain in her right hand. She rates her pain 6. Her current exercise regime is walking.    Pain Inventory Average Pain 8 Pain Right Now 9 My pain is constant, sharp, burning, dull, stabbing and aching  In the last 24 hours, has pain interfered with the following? General activity 6 Relation with others 6 Enjoyment of life 7 What TIME of day is your pain at its worst? at anytime Sleep (in general) Fair  Pain is worse with: walking, bending, sitting, inactivity, standing and some activites Pain improves with: rest and medication Relief from Meds: 5  Mobility walk without assistance how many minutes can you walk? 10-15 ability to climb steps?  yes do you drive?  yes Do you have any goals in this area?  no  Function Do you have any goals in this area?  no  Neuro/Psych numbness confusion depression anxiety  Prior Studies Any changes since last visit?  yes Mammogram  Physicians involved in your care Any changes since last visit?  yes [facility]   Family History  Problem Relation Age of Onset  . Hypertension Mother   . Hypertension Father   . Breast cancer Maternal Grandmother   . Liver cancer Maternal Grandmother   . Cancer Maternal Grandmother     breast cancer  . Ovarian cancer Cousin   . Cancer Cousin     1st c. maternal side/ cervical cancer  . Diabetes Maternal Grandfather     both sides  . Cancer Cousin     1st c maternal/ colon cancer   History   Social History  . Marital Status: Married    Spouse Name: N/A    Number of Children: 4  . Years of Education: N/A   Occupational History  . disabled     Social History Main Topics  . Smoking status: Former Smoker -- 1.00 packs/day for 20 years    Types: Cigarettes    Quit date: 07/31/2007  . Smokeless tobacco: Never Used  . Alcohol Use: No  . Drug Use: No  . Sexual Activity: Yes    Birth Control/ Protection: Surgical   Other Topics Concern  . None   Social History Narrative   Past Surgical History  Procedure Laterality Date  . Back surgery  1997, 2003  . Abdominal hysterectomy    . Cystectomy    . Diagnostic laparoscopy     Past Medical History  Diagnosis Date  . Fibromyalgia   . Anxiety   . Depression   . Hyperlipidemia   . Asthma   . Degenerative joint disease   . Osteoarthritis   . Belchings   . Lumbar post-laminectomy syndrome   . Lumbosacral neuritis   . Disturbance of skin sensation   . Sciatica   . Chronic pain syndrome   . Other chronic postoperative pain    BP 106/70 mmHg  Pulse 70  Resp 14  Ht 5\' 4"  (1.626 m)  Wt 156 lb (70.761 kg)  BMI 26.76 kg/m2  SpO2 95%  Opioid Risk Score:   Fall Risk Score: Low Fall Risk (0-5 points)  Review of Systems     Objective:   Physical Exam  Constitutional: She is oriented to person, place, and time. She appears well-developed and well-nourished.  HENT:  Head: Normocephalic and atraumatic.  Neck: Normal range of motion. Neck supple.  Cardiovascular: Normal rate and regular rhythm.   Pulmonary/Chest: Effort normal and breath sounds normal.  Musculoskeletal:  Normal Muscle ulk and Muscle Testing Reveals: Upper extremities: Decreased ROM 90 Degrees and Muscle strength 4/5 Hypersensitivity Thoracic and Lumbar Lower extremities: Full ROM and Muscle Strength 5/5 Arises from chair with ease Narrow Based gait  Neurological: She is alert and oriented to person, place, and time.  Skin: Skin is warm and dry.  Psychiatric: She has a normal mood and affect.  Nursing note and vitals reviewed.         Assessment & Plan:  1.Lumbar postlaminectomy syndrome  status post L5-S1 fusion radiating to WUJ:WJXBJYNWLLE:Refilled: MS contin 30 mg # 90 pills--use one pill every 8 hours for pain  2. Depression: Continue Current Medication Regimen of Prozac.  3. Right Hand Neuropathy: EMG with Dr. Wynn BankerKirsteins  15 minutes of face to face patient care time was spent during this visit. All questions were encouraged and answered.   F/U in 1 month.

## 2014-08-05 ENCOUNTER — Encounter: Payer: Self-pay | Admitting: Physical Medicine & Rehabilitation

## 2014-08-05 ENCOUNTER — Encounter: Payer: PRIVATE HEALTH INSURANCE | Attending: Registered Nurse

## 2014-08-05 ENCOUNTER — Ambulatory Visit (HOSPITAL_BASED_OUTPATIENT_CLINIC_OR_DEPARTMENT_OTHER): Payer: PRIVATE HEALTH INSURANCE | Admitting: Physical Medicine & Rehabilitation

## 2014-08-05 ENCOUNTER — Other Ambulatory Visit: Payer: Self-pay | Admitting: Physical Medicine & Rehabilitation

## 2014-08-05 VITALS — BP 130/60 | HR 68 | Resp 14 | Ht 63.5 in | Wt 160.0 lb

## 2014-08-05 DIAGNOSIS — M1990 Unspecified osteoarthritis, unspecified site: Secondary | ICD-10-CM | POA: Insufficient documentation

## 2014-08-05 DIAGNOSIS — M797 Fibromyalgia: Secondary | ICD-10-CM | POA: Insufficient documentation

## 2014-08-05 DIAGNOSIS — Z5181 Encounter for therapeutic drug level monitoring: Secondary | ICD-10-CM

## 2014-08-05 DIAGNOSIS — G8929 Other chronic pain: Secondary | ICD-10-CM | POA: Diagnosis present

## 2014-08-05 DIAGNOSIS — J45909 Unspecified asthma, uncomplicated: Secondary | ICD-10-CM | POA: Insufficient documentation

## 2014-08-05 DIAGNOSIS — Z87891 Personal history of nicotine dependence: Secondary | ICD-10-CM | POA: Diagnosis not present

## 2014-08-05 DIAGNOSIS — M961 Postlaminectomy syndrome, not elsewhere classified: Secondary | ICD-10-CM | POA: Diagnosis not present

## 2014-08-05 DIAGNOSIS — F329 Major depressive disorder, single episode, unspecified: Secondary | ICD-10-CM | POA: Diagnosis not present

## 2014-08-05 DIAGNOSIS — Z79899 Other long term (current) drug therapy: Secondary | ICD-10-CM

## 2014-08-05 DIAGNOSIS — R208 Other disturbances of skin sensation: Secondary | ICD-10-CM

## 2014-08-05 DIAGNOSIS — G894 Chronic pain syndrome: Secondary | ICD-10-CM

## 2014-08-05 DIAGNOSIS — Z981 Arthrodesis status: Secondary | ICD-10-CM | POA: Diagnosis not present

## 2014-08-05 DIAGNOSIS — G629 Polyneuropathy, unspecified: Secondary | ICD-10-CM | POA: Diagnosis not present

## 2014-08-05 DIAGNOSIS — E785 Hyperlipidemia, unspecified: Secondary | ICD-10-CM | POA: Insufficient documentation

## 2014-08-05 MED ORDER — MORPHINE SULFATE ER 30 MG PO TBCR: 30.0000 mg | EXTENDED_RELEASE_TABLET | Freq: Three times a day (TID) | ORAL | Status: DC

## 2014-08-05 NOTE — Patient Instructions (Addendum)
Preliminary report does not look like carpal tunnel or pinched nerve, will write up final report which Riley Lamunice will review with you next month

## 2014-08-05 NOTE — Progress Notes (Signed)
   Subjective:    Patient ID: Cindy Pratt, female    DOB: 1966/03/04, 48 y.o.   MRN: [id number]  HPI    Review of Systems  HENT: Negative.   Eyes: Negative.   Respiratory: Negative.   Cardiovascular: Negative.   Gastrointestinal: Negative.   Endocrine: Negative.   Genitourinary: Negative.   Musculoskeletal: Positive for myalgias, back pain and neck pain.  Skin: Negative.   Allergic/Immunologic: Negative.   Neurological: Positive for weakness.  Hematological: Negative.   Psychiatric/Behavioral: Positive for confusion and dysphoric mood. The patient is nervous/anxious.        Objective:   Physical Exam        Assessment & Plan:

## 2014-08-06 LAB — PMP ALCOHOL METABOLITE (ETG): Ethyl Glucuronide (EtG): NEGATIVE ng/mL

## 2014-08-07 ENCOUNTER — Encounter: Payer: Self-pay | Admitting: Physical Medicine & Rehabilitation

## 2014-08-08 LAB — OPIATES/OPIOIDS (LC/MS-MS)
CODEINE URINE: NEGATIVE ng/mL (ref <50)
Hydrocodone: NEGATIVE ng/mL (ref <50)
Hydromorphone: 114 ng/mL (ref <50)
Morphine Urine: 14888 ng/mL (ref <50)
NORHYDROCODONE, UR: NEGATIVE ng/mL (ref <50)
Noroxycodone, Ur: NEGATIVE ng/mL (ref <50)
OXYMORPHONE, URINE: NEGATIVE ng/mL (ref <50)
Oxycodone, ur: NEGATIVE ng/mL (ref <50)

## 2014-08-09 LAB — PRESCRIPTION MONITORING PROFILE (SOLSTAS)
Amphetamine/Meth: NEGATIVE ng/mL
BUPRENORPHINE, URINE: NEGATIVE ng/mL
Barbiturate Screen, Urine: NEGATIVE ng/mL
Benzodiazepine Screen, Urine: NEGATIVE ng/mL
CREATININE, URINE: 36.02 mg/dL (ref >20.0)
Cannabinoid Scrn, Ur: NEGATIVE ng/mL
Carisoprodol, Urine: NEGATIVE ng/mL
Cocaine Metabolites: NEGATIVE ng/mL
ECSTASY: NEGATIVE ng/mL
FENTANYL URINE: NEGATIVE ng/mL
MEPERIDINE UR: NEGATIVE ng/mL
Methadone Screen, Urine: NEGATIVE ng/mL
Nitrites, Initial: NEGATIVE ug/mL
Oxycodone Screen, Ur: NEGATIVE ng/mL
PROPOXYPHENE: NEGATIVE ng/mL
TAPENTADOLUR: NEGATIVE ng/mL
Tramadol Scrn, Ur: NEGATIVE ng/mL
Zolpidem, Urine: NEGATIVE ng/mL
pH, Initial: 7.5 pH (ref 4.5–8.9)

## 2014-08-14 NOTE — Progress Notes (Signed)
Urine drug screen for this encounter is consistent 

## 2014-08-22 ENCOUNTER — Emergency Department: Payer: Self-pay | Admitting: Emergency Medicine

## 2014-09-02 ENCOUNTER — Encounter: Payer: Medicare Other | Attending: Registered Nurse | Admitting: Registered Nurse

## 2014-09-02 ENCOUNTER — Encounter: Payer: Self-pay | Admitting: Registered Nurse

## 2014-09-02 VITALS — BP 108/70 | HR 64 | Resp 14

## 2014-09-02 DIAGNOSIS — Z79899 Other long term (current) drug therapy: Secondary | ICD-10-CM

## 2014-09-02 DIAGNOSIS — M961 Postlaminectomy syndrome, not elsewhere classified: Secondary | ICD-10-CM | POA: Diagnosis not present

## 2014-09-02 DIAGNOSIS — E785 Hyperlipidemia, unspecified: Secondary | ICD-10-CM | POA: Diagnosis not present

## 2014-09-02 DIAGNOSIS — M1990 Unspecified osteoarthritis, unspecified site: Secondary | ICD-10-CM | POA: Diagnosis not present

## 2014-09-02 DIAGNOSIS — J45909 Unspecified asthma, uncomplicated: Secondary | ICD-10-CM | POA: Diagnosis not present

## 2014-09-02 DIAGNOSIS — Z981 Arthrodesis status: Secondary | ICD-10-CM | POA: Insufficient documentation

## 2014-09-02 DIAGNOSIS — Z87891 Personal history of nicotine dependence: Secondary | ICD-10-CM | POA: Insufficient documentation

## 2014-09-02 DIAGNOSIS — F329 Major depressive disorder, single episode, unspecified: Secondary | ICD-10-CM | POA: Insufficient documentation

## 2014-09-02 DIAGNOSIS — M797 Fibromyalgia: Secondary | ICD-10-CM

## 2014-09-02 DIAGNOSIS — G629 Polyneuropathy, unspecified: Secondary | ICD-10-CM | POA: Diagnosis not present

## 2014-09-02 DIAGNOSIS — Z5181 Encounter for therapeutic drug level monitoring: Secondary | ICD-10-CM

## 2014-09-02 DIAGNOSIS — G894 Chronic pain syndrome: Secondary | ICD-10-CM

## 2014-09-02 DIAGNOSIS — M25561 Pain in right knee: Secondary | ICD-10-CM

## 2014-09-02 DIAGNOSIS — G8929 Other chronic pain: Secondary | ICD-10-CM | POA: Diagnosis present

## 2014-09-02 MED ORDER — MORPHINE SULFATE ER 30 MG PO TBCR: 30.0000 mg | EXTENDED_RELEASE_TABLET | Freq: Three times a day (TID) | ORAL | Status: DC

## 2014-09-02 NOTE — Progress Notes (Signed)
Subjective:    Patient ID: Cindy Pratt, female    DOB: [birth year], 49 y.o.   MRN: [id number]  HPI: [name] is a 49 year old female who returns for follow up for chronic pain and medication refill. She's complaining of generalized pain and right knee today. She rates her pain 6. Her current exercise regime is walking.  She states" she fell 2-3 weeks off her porch when it was raining. Her husband drove her [facility]. [facility] complaining of increase intensity of pain on her right knee since the fall will order a X-ray today, she verbalizes understanding.  Pain Inventory Average Pain 8 Pain Right Now 6 My pain is constant, sharp, burning, dull, stabbing, tingling and aching  In the last 24 hours, has pain interfered with the following? General activity 8 Relation with others 8 Enjoyment of life 8 What TIME of day is your pain at its worst? all Sleep (in general) Fair  Pain is worse with: all Pain improves with: rest and medication Relief from Meds: 9  Mobility Do you have any goals in this area?  no  Function Do you have any goals in this area?  no  Neuro/Psych numbness spasms depression anxiety  Prior Studies Any changes since last visit?  no  Physicians involved in your care Any changes since last visit?  no   Family History  Problem Relation Age of Onset  . Hypertension Mother   . Hypertension Father   . Breast cancer Maternal Grandmother   . Liver cancer Maternal Grandmother   . Cancer Maternal Grandmother     breast cancer  . Ovarian cancer Cousin   . Cancer Cousin     1st c. maternal side/ cervical cancer  . Diabetes Maternal Grandfather     both sides  . Cancer Cousin     1st c maternal/ colon cancer   History   Social History  . Marital Status: Married    Spouse Name: N/A    Number of Children: 4  . Years of Education: N/A   Occupational History  . disabled    Social History Main Topics  . Smoking status: Former  Smoker -- 1.00 packs/day for 20 years    Types: Cigarettes    Quit date: 07/31/2007  . Smokeless tobacco: Never Used  . Alcohol Use: No  . Drug Use: No  . Sexual Activity: Yes    Birth Control/ Protection: Surgical   Other Topics Concern  . None   Social History Narrative   Past Surgical History  Procedure Laterality Date  . Back surgery  1997, 2003  . Abdominal hysterectomy    . Cystectomy    . Diagnostic laparoscopy     Past Medical History  Diagnosis Date  . Fibromyalgia   . Anxiety   . Depression   . Hyperlipidemia   . Asthma   . Degenerative joint disease   . Osteoarthritis   . Belchings   . Lumbar post-laminectomy syndrome   . Lumbosacral neuritis   . Disturbance of skin sensation   . Sciatica   . Chronic pain syndrome   . Other chronic postoperative pain    BP 108/70 mmHg  Pulse 64  Resp 14  SpO2 96%  Opioid Risk Score:   Fall Risk Score: Moderate Fall Risk (6-13 points) (pt declined pamphlet) Review of Systems  Neurological: Positive for numbness.       Spasms  Psychiatric/Behavioral: Positive for dysphoric mood. The patient is  nervous/anxious.   All other systems reviewed and are negative.      Objective:   Physical Exam  Constitutional: She is oriented to person, place, and time. She appears well-developed and well-nourished.  HENT:  Head: Normocephalic.  Neck: Normal range of motion. Neck supple.  Cervical Paraspinal Tenderness: C-3-C-5  Cardiovascular: Normal rate and regular rhythm.   Pulmonary/Chest: Effort normal and breath sounds normal.  Musculoskeletal:  Normal Muscle Bulk and Muscle Testing Reveals: Upper Extremities: Full ROM and Muscle strength 5/5 Thoracic and Lumbar Hypersensitivity Lower extremities: Full ROM and Muscle strength 5/5 Right Knee Flexion Produces pain into Patella. Arises from chair with ease Narrow Based gait.  Neurological: She is alert and oriented to person, place, and time.  Skin: Skin is warm and dry.    Psychiatric: She has a normal mood and affect.  Nursing note and vitals reviewed.         Assessment & Plan:  1.Lumbar postlaminectomy syndrome status post L5-S1 fusion radiating to ZOX:WRUEAVWU: MS contin 30 mg # 90 pills--use one pill every 8 hours for pain  2. Depression: Continue Current Medication Regimen of Prozac.  3. Right Hand Neuropathy: EMG performed last visit. Normal Study. Continue to Monitor.  15 minutes of face to face patient care time was spent during this visit. All questions were encouraged and answered.   F/U in 1 month.

## 2014-10-01 ENCOUNTER — Encounter: Payer: Medicare Other | Attending: Registered Nurse | Admitting: Registered Nurse

## 2014-10-01 ENCOUNTER — Encounter: Payer: Self-pay | Admitting: Registered Nurse

## 2014-10-01 VITALS — BP 107/63 | HR 69 | Resp 14

## 2014-10-01 DIAGNOSIS — M1990 Unspecified osteoarthritis, unspecified site: Secondary | ICD-10-CM | POA: Insufficient documentation

## 2014-10-01 DIAGNOSIS — J45909 Unspecified asthma, uncomplicated: Secondary | ICD-10-CM | POA: Diagnosis not present

## 2014-10-01 DIAGNOSIS — Z87891 Personal history of nicotine dependence: Secondary | ICD-10-CM | POA: Insufficient documentation

## 2014-10-01 DIAGNOSIS — F329 Major depressive disorder, single episode, unspecified: Secondary | ICD-10-CM | POA: Insufficient documentation

## 2014-10-01 DIAGNOSIS — M797 Fibromyalgia: Secondary | ICD-10-CM | POA: Diagnosis not present

## 2014-10-01 DIAGNOSIS — E785 Hyperlipidemia, unspecified: Secondary | ICD-10-CM | POA: Insufficient documentation

## 2014-10-01 DIAGNOSIS — G894 Chronic pain syndrome: Secondary | ICD-10-CM

## 2014-10-01 DIAGNOSIS — Z981 Arthrodesis status: Secondary | ICD-10-CM | POA: Diagnosis not present

## 2014-10-01 DIAGNOSIS — G8929 Other chronic pain: Secondary | ICD-10-CM | POA: Diagnosis present

## 2014-10-01 DIAGNOSIS — Z79899 Other long term (current) drug therapy: Secondary | ICD-10-CM

## 2014-10-01 DIAGNOSIS — G629 Polyneuropathy, unspecified: Secondary | ICD-10-CM | POA: Diagnosis not present

## 2014-10-01 DIAGNOSIS — M961 Postlaminectomy syndrome, not elsewhere classified: Secondary | ICD-10-CM | POA: Insufficient documentation

## 2014-10-01 DIAGNOSIS — Z5181 Encounter for therapeutic drug level monitoring: Secondary | ICD-10-CM

## 2014-10-01 MED ORDER — MORPHINE SULFATE ER 30 MG PO TBCR: 30.0000 mg | EXTENDED_RELEASE_TABLET | Freq: Three times a day (TID) | ORAL | Status: DC

## 2014-10-01 NOTE — Progress Notes (Signed)
Subjective:    Patient ID: Cindy Pratt, female    DOB: [birth year], 49 y.o.   MRN: [id number]  HPI: Ms. FLORANCE PAOLILLO is a 49 year old female who returns for follow up for chronic pain and medication refill. She says her pain is located in her neck, right wrist,lower back and left hip. She rates her pain 6. Her current exercise regime is walking.   Pain Inventory Average Pain 8 Pain Right Now 6 My pain is intermittent, sharp, burning, dull, stabbing, tingling and aching  In the last 24 hours, has pain interfered with the following? General activity 8 Relation with others 8 Enjoyment of life 8 What TIME of day is your pain at its worst? depends Sleep (in general) Fair  Pain is worse with: all Pain improves with: medication Relief from Meds: 4  Mobility walk without assistance ability to climb steps?  yes do you drive?  yes Do you have any goals in this area?  yes  Function Do you have any goals in this area?  no  Neuro/Psych weakness numbness tingling dizziness confusion depression anxiety  Prior Studies Any changes since last visit?  no  Physicians involved in your care Any changes since last visit?  no   Family History  Problem Relation Age of Onset  . Hypertension Mother   . Hypertension Father   . Breast cancer Maternal Grandmother   . Liver cancer Maternal Grandmother   . Cancer Maternal Grandmother     breast cancer  . Ovarian cancer Cousin   . Cancer Cousin     1st c. maternal side/ cervical cancer  . Diabetes Maternal Grandfather     both sides  . Cancer Cousin     1st c maternal/ colon cancer   History   Social History  . Marital Status: Married    Spouse Name: N/A    Number of Children: 4  . Years of Education: N/A   Occupational History  . disabled    Social History Main Topics  . Smoking status: Former Smoker -- 1.00 packs/day for 20 years    Types: Cigarettes    Quit date: 07/31/2007  . Smokeless tobacco: Never Used  .  Alcohol Use: No  . Drug Use: No  . Sexual Activity: Yes    Birth Control/ Protection: Surgical   Other Topics Concern  . None   Social History Narrative   Past Surgical History  Procedure Laterality Date  . Back surgery  1997, 2003  . Abdominal hysterectomy    . Cystectomy    . Diagnostic laparoscopy     Past Medical History  Diagnosis Date  . Fibromyalgia   . Anxiety   . Depression   . Hyperlipidemia   . Asthma   . Degenerative joint disease   . Osteoarthritis   . Belchings   . Lumbar post-laminectomy syndrome   . Lumbosacral neuritis   . Disturbance of skin sensation   . Sciatica   . Chronic pain syndrome   . Other chronic postoperative pain    There were no vitals taken for this visit.  Opioid Risk Score:   Fall Risk Score: Moderate Fall Risk (6-13 points) Review of Systems  Neurological: Positive for dizziness, weakness and numbness.       Tingling  Psychiatric/Behavioral: Positive for confusion and dysphoric mood. The patient is nervous/anxious.   All other systems reviewed and are negative.      Objective:   Physical Exam  Constitutional: She  is oriented to person, place, and time. She appears well-developed and well-nourished.  HENT:  Head: Normocephalic and atraumatic.  Neck: Normal range of motion. Neck supple.  Cardiovascular: Normal rate and regular rhythm.   Pulmonary/Chest: Effort normal and breath sounds normal.  Musculoskeletal:  Normal Muscle Bulk and Muscle Testing Reveals: Upper Extremities: Full ROM and Muscle Strength 5/5 Right AC Joint Tenderness Right Spine of Scapula Tenderness Lower Extremities: Full ROM and Muscle strength 5/5 Arises from chair with ease Narrow Based gait  Neurological: She is alert and oriented to person, place, and time.  Skin: Skin is warm and dry.  Psychiatric: She has a normal mood and affect.  Nursing note and vitals reviewed.         Assessment & Plan:  1.Lumbar postlaminectomy syndrome status  post L5-S1 fusion radiating to ZOX:WRUEAVWULLE:Refilled: MS contin 30 mg # 90 pills--use one pill every 8 hours for pain  2. Depression: Continue Current Medication Regimen of Prozac.  3. Right Hand Neuropathy: EMG Normal/ Continue to Monitor  15 minutes of face to face patient care time was spent during this visit. All questions were encouraged and answered.   F/U in 1 month.

## 2014-10-31 ENCOUNTER — Other Ambulatory Visit: Payer: Self-pay | Admitting: Physical Medicine & Rehabilitation

## 2014-10-31 ENCOUNTER — Encounter: Payer: Self-pay | Admitting: Registered Nurse

## 2014-10-31 ENCOUNTER — Encounter: Payer: Medicare Other | Attending: Registered Nurse | Admitting: Registered Nurse

## 2014-10-31 VITALS — BP 110/60 | HR 73 | Resp 14

## 2014-10-31 DIAGNOSIS — M797 Fibromyalgia: Secondary | ICD-10-CM | POA: Diagnosis not present

## 2014-10-31 DIAGNOSIS — E785 Hyperlipidemia, unspecified: Secondary | ICD-10-CM | POA: Insufficient documentation

## 2014-10-31 DIAGNOSIS — M1990 Unspecified osteoarthritis, unspecified site: Secondary | ICD-10-CM | POA: Insufficient documentation

## 2014-10-31 DIAGNOSIS — Z981 Arthrodesis status: Secondary | ICD-10-CM | POA: Diagnosis not present

## 2014-10-31 DIAGNOSIS — Z5181 Encounter for therapeutic drug level monitoring: Secondary | ICD-10-CM

## 2014-10-31 DIAGNOSIS — Z87891 Personal history of nicotine dependence: Secondary | ICD-10-CM | POA: Insufficient documentation

## 2014-10-31 DIAGNOSIS — F329 Major depressive disorder, single episode, unspecified: Secondary | ICD-10-CM | POA: Diagnosis not present

## 2014-10-31 DIAGNOSIS — G8929 Other chronic pain: Secondary | ICD-10-CM | POA: Diagnosis present

## 2014-10-31 DIAGNOSIS — G629 Polyneuropathy, unspecified: Secondary | ICD-10-CM | POA: Diagnosis not present

## 2014-10-31 DIAGNOSIS — M961 Postlaminectomy syndrome, not elsewhere classified: Secondary | ICD-10-CM | POA: Diagnosis not present

## 2014-10-31 DIAGNOSIS — J45909 Unspecified asthma, uncomplicated: Secondary | ICD-10-CM | POA: Insufficient documentation

## 2014-10-31 DIAGNOSIS — Z79899 Other long term (current) drug therapy: Secondary | ICD-10-CM

## 2014-10-31 DIAGNOSIS — G894 Chronic pain syndrome: Secondary | ICD-10-CM

## 2014-10-31 MED ORDER — MORPHINE SULFATE ER 30 MG PO TBCR: 30.0000 mg | EXTENDED_RELEASE_TABLET | Freq: Three times a day (TID) | ORAL | Status: DC

## 2014-10-31 NOTE — Progress Notes (Signed)
Subjective:    Patient ID: Cindy Pratt, female    DOB: September 17, 1965, 49 y.o.   MRN: [id number]  HPI : Cindy Pratt is a 49 year old female who returns for follow up for chronic pain and medication refill. She's complaining of generalized pain all over her. She rates her pain 7. Her current exercise regime is walking, she's walking a mile a day. Husband in room.   Pain Inventory Average Pain 7 Pain Right Now 7 My pain is intermittent, constant and dull  In the last 24 hours, has pain interfered with the following? General activity 7 Relation with others 7 Enjoyment of life 7 What TIME of day is your pain at its worst? all Sleep (in general) Fair  Pain is worse with: no selection Pain improves with: medication Relief from Meds: 6  Mobility Do you have any goals in this area?  no  Function Do you have any goals in this area?  no  Neuro/Psych dizziness confusion depression anxiety  Prior Studies Any changes since last visit?  no  Physicians involved in your care Any changes since last visit?  no   Family History  Problem Relation Age of Onset  . Hypertension Mother   . Hypertension Father   . Breast cancer Maternal Grandmother   . Liver cancer Maternal Grandmother   . Cancer Maternal Grandmother     breast cancer  . Ovarian cancer Cousin   . Cancer Cousin     1st c. maternal side/ cervical cancer  . Diabetes Maternal Grandfather     both sides  . Cancer Cousin     1st c maternal/ colon cancer   History   Social History  . Marital Status: Married    Spouse Name: N/A  . Number of Children: 4  . Years of Education: N/A   Occupational History  . disabled    Social History Main Topics  . Smoking status: Former Smoker -- 1.00 packs/day for 20 years    Types: Cigarettes    Quit date: 07/31/2007  . Smokeless tobacco: Never Used  . Alcohol Use: No  . Drug Use: No  . Sexual Activity: Yes    Birth Control/ Protection: Surgical   Other Topics  Concern  . None   Social History Narrative   Past Surgical History  Procedure Laterality Date  . Back surgery  1997, 2003  . Abdominal hysterectomy    . Cystectomy    . Diagnostic laparoscopy     Past Medical History  Diagnosis Date  . Fibromyalgia   . Anxiety   . Depression   . Hyperlipidemia   . Asthma   . Degenerative joint disease   . Osteoarthritis   . Belchings   . Lumbar post-laminectomy syndrome   . Lumbosacral neuritis   . Disturbance of skin sensation   . Sciatica   . Chronic pain syndrome   . Other chronic postoperative pain    BP 110/60 mmHg  Pulse 73  Resp 14  SpO2 94%  Opioid Risk Score:   Fall Risk Score: Moderate Fall Risk (6-13 points) (patient previously educated)  Review of Systems  All other systems reviewed and are negative.      Objective:   Physical Exam  Constitutional: She is oriented to person, place, and time. She appears well-developed and well-nourished.  HENT:  Head: Normocephalic and atraumatic.  Neck: Normal range of motion. Neck supple.  Cervical Paraspinal Tenderness: C-3- C-5  Cardiovascular: Normal rate and  regular rhythm.   Pulmonary/Chest: Effort normal and breath sounds normal.  Musculoskeletal:  Normal Muscle Bulk and Muscle Testing Reveals: Upper Extremities: Full ROM and Muscle Strength 5/5 Thoracic Paraspinal Tenderness: T-1-T-2 T-5- T-7 Lumbar Paraspinal Tenderness: L-3- L-5 Lower Extremities: Full ROM and Muscle Strength 5/5 Arises from chair with ease Narrow Based Gait  Neurological: She is alert and oriented to person, place, and time.  Skin: Skin is warm and dry.  Psychiatric: She has a normal mood and affect.  Nursing note and vitals reviewed.         Assessment & Plan:  1.Lumbar postlaminectomy syndrome status post L5-S1 fusion radiating to ZOX:WRUEAVWU: MS contin 30 mg # 90 pills--use one pill every 8 hours for pain  2. Depression: Continue Current Medication Regimen of Prozac and Wellbutrin.   3. Right Hand Neuropathy: Continue to Monitor  15 minutes of face to face patient care time was spent during this visit. All questions were encouraged and answered.   F/U in 1 month.

## 2014-11-01 LAB — PMP ALCOHOL METABOLITE (ETG): Ethyl Glucuronide (EtG): NEGATIVE ng/mL

## 2014-11-04 LAB — OPIATES/OPIOIDS (LC/MS-MS)
Codeine Urine: NEGATIVE ng/mL (ref <50)
HYDROCODONE: NEGATIVE ng/mL (ref <50)
Hydromorphone: 410 ng/mL (ref <50)
Morphine Urine: 33719 ng/mL (ref <50)
NORHYDROCODONE, UR: NEGATIVE ng/mL (ref <50)
NOROXYCODONE, UR: NEGATIVE ng/mL (ref <50)
OXYMORPHONE, URINE: NEGATIVE ng/mL (ref <50)
Oxycodone, ur: NEGATIVE ng/mL (ref <50)

## 2014-11-05 LAB — PRESCRIPTION MONITORING PROFILE (SOLSTAS)
Amphetamine/Meth: NEGATIVE ng/mL
BARBITURATE SCREEN, URINE: NEGATIVE ng/mL
BENZODIAZEPINE SCREEN, URINE: NEGATIVE ng/mL
Buprenorphine, Urine: NEGATIVE ng/mL
Cannabinoid Scrn, Ur: NEGATIVE ng/mL
Carisoprodol, Urine: NEGATIVE ng/mL
Cocaine Metabolites: NEGATIVE ng/mL
Creatinine, Urine: 124.1 mg/dL (ref >20.0)
ECSTASY: NEGATIVE ng/mL
Fentanyl, Ur: NEGATIVE ng/mL
Meperidine, Ur: NEGATIVE ng/mL
Methadone Screen, Urine: NEGATIVE ng/mL
NITRITES URINE, INITIAL: NEGATIVE ug/mL
Oxycodone Screen, Ur: NEGATIVE ng/mL
PROPOXYPHENE: NEGATIVE ng/mL
TAPENTADOLUR: NEGATIVE ng/mL
Tramadol Scrn, Ur: NEGATIVE ng/mL
Zolpidem, Urine: NEGATIVE ng/mL
pH, Initial: 7.2 pH (ref 4.5–8.9)

## 2014-11-15 NOTE — Progress Notes (Signed)
Urine drug screen for this encounter is consistent for prescribed medication 

## 2014-12-02 ENCOUNTER — Encounter: Payer: Self-pay | Admitting: Registered Nurse

## 2014-12-02 ENCOUNTER — Encounter: Payer: Medicare Other | Attending: Registered Nurse | Admitting: Registered Nurse

## 2014-12-02 VITALS — BP 122/77 | HR 64 | Resp 14

## 2014-12-02 DIAGNOSIS — F329 Major depressive disorder, single episode, unspecified: Secondary | ICD-10-CM | POA: Insufficient documentation

## 2014-12-02 DIAGNOSIS — G8929 Other chronic pain: Secondary | ICD-10-CM | POA: Diagnosis present

## 2014-12-02 DIAGNOSIS — Z5181 Encounter for therapeutic drug level monitoring: Secondary | ICD-10-CM

## 2014-12-02 DIAGNOSIS — M1990 Unspecified osteoarthritis, unspecified site: Secondary | ICD-10-CM | POA: Insufficient documentation

## 2014-12-02 DIAGNOSIS — Z87891 Personal history of nicotine dependence: Secondary | ICD-10-CM | POA: Diagnosis not present

## 2014-12-02 DIAGNOSIS — Z79899 Other long term (current) drug therapy: Secondary | ICD-10-CM

## 2014-12-02 DIAGNOSIS — M961 Postlaminectomy syndrome, not elsewhere classified: Secondary | ICD-10-CM | POA: Insufficient documentation

## 2014-12-02 DIAGNOSIS — J45909 Unspecified asthma, uncomplicated: Secondary | ICD-10-CM | POA: Insufficient documentation

## 2014-12-02 DIAGNOSIS — M797 Fibromyalgia: Secondary | ICD-10-CM | POA: Diagnosis not present

## 2014-12-02 DIAGNOSIS — G894 Chronic pain syndrome: Secondary | ICD-10-CM | POA: Diagnosis not present

## 2014-12-02 DIAGNOSIS — G629 Polyneuropathy, unspecified: Secondary | ICD-10-CM | POA: Insufficient documentation

## 2014-12-02 DIAGNOSIS — Z981 Arthrodesis status: Secondary | ICD-10-CM | POA: Insufficient documentation

## 2014-12-02 DIAGNOSIS — E785 Hyperlipidemia, unspecified: Secondary | ICD-10-CM | POA: Diagnosis not present

## 2014-12-02 MED ORDER — MORPHINE SULFATE ER 30 MG PO TBCR: 30.0000 mg | EXTENDED_RELEASE_TABLET | Freq: Three times a day (TID) | ORAL | Status: DC

## 2014-12-02 MED ORDER — TIZANIDINE HCL 4 MG PO TABS: 4.0000 mg | ORAL_TABLET | Freq: Two times a day (BID) | ORAL | Status: DC

## 2014-12-02 NOTE — Progress Notes (Signed)
Subjective:    Patient ID: Cindy Pratt, female    DOB: Nov 06, 1965, 49 y.o.   MRN: [id number]  HPI: Cindy Pratt is a 49 year old female who returns for follow up for chronic pain and medication refill. She says her pain is located in her neck and lower back. She rates her pain 6. Her current exercise regime is walking. Husband in room.  Pain Inventory Average Pain 8 Pain Right Now 6 My pain is intermittent, sharp, burning, dull, stabbing, tingling and aching  In the last 24 hours, has pain interfered with the following? General activity 8 Relation with others 8 Enjoyment of life 8 What TIME of day is your pain at its worst? varies Sleep (in general) Good  Pain is worse with: some activites Pain improves with: medication Relief from Meds: 8  Mobility walk without assistance ability to climb steps?  yes  Function Do you have any goals in this area?  no  Neuro/Psych weakness numbness tingling confusion depression anxiety  Prior Studies Any changes since last visit?  no  Physicians involved in your care Any changes since last visit?  no   Family History  Problem Relation Age of Onset  . Hypertension Mother   . Hypertension Father   . Breast cancer Maternal Grandmother   . Liver cancer Maternal Grandmother   . Cancer Maternal Grandmother     breast cancer  . Ovarian cancer Cousin   . Cancer Cousin     1st c. maternal side/ cervical cancer  . Diabetes Maternal Grandfather     both sides  . Cancer Cousin     1st c maternal/ colon cancer   History   Social History  . Marital Status: Married    Spouse Name: N/A  . Number of Children: 4  . Years of Education: N/A   Occupational History  . disabled    Social History Main Topics  . Smoking status: Former Smoker -- 1.00 packs/day for 20 years    Types: Cigarettes    Quit date: 07/31/2007  . Smokeless tobacco: Never Used  . Alcohol Use: No  . Drug Use: No  . Sexual Activity: Yes    Birth  Control/ Protection: Surgical   Other Topics Concern  . None   Social History Narrative   Past Surgical History  Procedure Laterality Date  . Back surgery  1997, 2003  . Abdominal hysterectomy    . Cystectomy    . Diagnostic laparoscopy     Past Medical History  Diagnosis Date  . Fibromyalgia   . Anxiety   . Depression   . Hyperlipidemia   . Asthma   . Degenerative joint disease   . Osteoarthritis   . Belchings   . Lumbar post-laminectomy syndrome   . Lumbosacral neuritis   . Disturbance of skin sensation   . Sciatica   . Chronic pain syndrome   . Other chronic postoperative pain    BP 122/77 mmHg  Pulse 64  Resp 14  SpO2 99%  Opioid Risk Score:   Fall Risk Score: Low Fall Risk (0-5 points) (previously edcuated and given handout)`1  Depression screen PHQ 2/9  Depression screen PHQ 2/9 12/02/2014  Decreased Interest 1  Down, Depressed, Hopeless 1  PHQ - 2 Score 2  Altered sleeping 1  Tired, decreased energy 1  Change in appetite 0  Feeling bad or failure about yourself  0  Trouble concentrating 1  Moving slowly or fidgety/restless 0  Suicidal thoughts 0  PHQ-9 Score 5     Review of Systems  Neurological: Positive for weakness and numbness.       Tingling  Psychiatric/Behavioral: Positive for confusion and dysphoric mood. The patient is nervous/anxious.   All other systems reviewed and are negative.      Objective:   Physical Exam  Constitutional: She is oriented to person, place, and time. She appears well-developed and well-nourished.  HENT:  Head: Normocephalic and atraumatic.  Neck: Normal range of motion. Neck supple.  Cervical Paraspinal Tenderness: C-5- C-6  Cardiovascular: Normal rate and regular rhythm.   Pulmonary/Chest: Effort normal and breath sounds normal.  Musculoskeletal:  Normal Muscle Bulk and Muscle Testing Reveals: Upper Extremities: Full ROM and Muscle Strength 5/5 Lumbar Paraspinal Tenderness: L-3- L-5 Lower Extremities:  Full ROM and Muscle Strength 5/5 Right Lower Extremity Flexion Produces Pain into Lumbar Arises from chair with ease Narrow Based gait  Neurological: She is alert and oriented to person, place, and time.  Skin: Skin is warm and dry.  Psychiatric: She has a normal mood and affect.  Nursing note and vitals reviewed.         Assessment & Plan:  1.Lumbar postlaminectomy syndrome status post L5-S1 fusion radiating to ZHY:QMVHQION: MS contin 30 mg # 90 pills--use one pill every 8 hours for pain  2. Depression: Continue Current Medication Regimen of Prozac and Wellbutrin.  3. Right Hand Neuropathy: Continue to Monitor  15 minutes of face to face patient care time was spent during this visit. All questions were encouraged and answered.   F/U in 1 month

## 2014-12-30 ENCOUNTER — Encounter: Payer: Medicare Other | Attending: Registered Nurse | Admitting: Registered Nurse

## 2014-12-30 ENCOUNTER — Encounter: Payer: Self-pay | Admitting: Registered Nurse

## 2014-12-30 VITALS — BP 115/70 | HR 66 | Resp 14

## 2014-12-30 DIAGNOSIS — J45909 Unspecified asthma, uncomplicated: Secondary | ICD-10-CM | POA: Insufficient documentation

## 2014-12-30 DIAGNOSIS — M1990 Unspecified osteoarthritis, unspecified site: Secondary | ICD-10-CM | POA: Insufficient documentation

## 2014-12-30 DIAGNOSIS — Z87891 Personal history of nicotine dependence: Secondary | ICD-10-CM | POA: Diagnosis not present

## 2014-12-30 DIAGNOSIS — G894 Chronic pain syndrome: Secondary | ICD-10-CM

## 2014-12-30 DIAGNOSIS — M961 Postlaminectomy syndrome, not elsewhere classified: Secondary | ICD-10-CM | POA: Insufficient documentation

## 2014-12-30 DIAGNOSIS — E785 Hyperlipidemia, unspecified: Secondary | ICD-10-CM | POA: Diagnosis not present

## 2014-12-30 DIAGNOSIS — M797 Fibromyalgia: Secondary | ICD-10-CM | POA: Diagnosis not present

## 2014-12-30 DIAGNOSIS — Z5181 Encounter for therapeutic drug level monitoring: Secondary | ICD-10-CM

## 2014-12-30 DIAGNOSIS — Z981 Arthrodesis status: Secondary | ICD-10-CM | POA: Diagnosis not present

## 2014-12-30 DIAGNOSIS — R413 Other amnesia: Secondary | ICD-10-CM | POA: Diagnosis not present

## 2014-12-30 DIAGNOSIS — G8929 Other chronic pain: Secondary | ICD-10-CM | POA: Diagnosis present

## 2014-12-30 DIAGNOSIS — F329 Major depressive disorder, single episode, unspecified: Secondary | ICD-10-CM | POA: Insufficient documentation

## 2014-12-30 DIAGNOSIS — Z79899 Other long term (current) drug therapy: Secondary | ICD-10-CM

## 2014-12-30 DIAGNOSIS — G629 Polyneuropathy, unspecified: Secondary | ICD-10-CM | POA: Diagnosis not present

## 2014-12-30 MED ORDER — MORPHINE SULFATE ER 30 MG PO TBCR: 30.0000 mg | EXTENDED_RELEASE_TABLET | Freq: Three times a day (TID) | ORAL | Status: DC

## 2014-12-30 NOTE — Progress Notes (Signed)
Subjective:    Patient ID: Cindy Pratt, female    DOB: 06-06-1966, 49 y.o.   MRN: [id number]  HPI: Cindy Pratt is a 49 year old female who returns for follow up for chronic pain and medication refill. She's complaining of generalized pain, fibro pain and headaches. Also states she's having problems with her memory such as forgetfulness and remembering.  She rates her pain 7. Her current exercise regime is walking. Memory recall checked, short term memory intact will refer to Neurology. She verbalizes understanding.  Pain Inventory Average Pain 9 Pain Right Now 7 My pain is constant, sharp, burning, dull, stabbing, tingling, aching and throbbing  In the last 24 hours, has pain interfered with the following? General activity 9 Relation with others 9 Enjoyment of life 9 What TIME of day is your pain at its worst? ALL Sleep (in general) Poor  Pain is worse with: walking, bending, sitting, standing and some activites Pain improves with: rest and medication Relief from Meds: 7  Mobility walk without assistance how many minutes can you walk? 10 ability to climb steps?  no do you drive?  no  Function disabled: date disabled .  Neuro/Psych weakness numbness tingling dizziness confusion depression anxiety  Prior Studies Any changes since last visit?  no  Physicians involved in your care Any changes since last visit?  no   Family History  Problem Relation Age of Onset  . Hypertension Mother   . Hypertension Father   . Breast cancer Maternal Grandmother   . Liver cancer Maternal Grandmother   . Cancer Maternal Grandmother     breast cancer  . Ovarian cancer Cousin   . Cancer Cousin     1st c. maternal side/ cervical cancer  . Diabetes Maternal Grandfather     both sides  . Cancer Cousin     1st c maternal/ colon cancer   History   Social History  . Marital Status: Married    Spouse Name: N/A  . Number of Children: 4  . Years of Education: N/A    Occupational History  . disabled    Social History Main Topics  . Smoking status: Former Smoker -- 1.00 packs/day for 20 years    Types: Cigarettes    Quit date: 07/31/2007  . Smokeless tobacco: Never Used  . Alcohol Use: No  . Drug Use: No  . Sexual Activity: Yes    Birth Control/ Protection: Surgical   Other Topics Concern  . None   Social History Narrative   Past Surgical History  Procedure Laterality Date  . Back surgery  1997, 2003  . Abdominal hysterectomy    . Cystectomy    . Diagnostic laparoscopy     Past Medical History  Diagnosis Date  . Fibromyalgia   . Anxiety   . Depression   . Hyperlipidemia   . Asthma   . Degenerative joint disease   . Osteoarthritis   . Belchings   . Lumbar post-laminectomy syndrome   . Lumbosacral neuritis   . Disturbance of skin sensation   . Sciatica   . Chronic pain syndrome   . Other chronic postoperative pain    BP 115/70 mmHg  Pulse 66  Resp 14  SpO2 95%  Opioid Risk Score:   Fall Risk Score: Low Fall Risk (0-5 points)`1  Depression screen PHQ 2/9  Depression screen PHQ 2/9 12/02/2014  Decreased Interest 1  Down, Depressed, Hopeless 1  PHQ - 2 Score 2  Altered  sleeping 1  Tired, decreased energy 1  Change in appetite 0  Feeling bad or failure about yourself  0  Trouble concentrating 1  Moving slowly or fidgety/restless 0  Suicidal thoughts 0  PHQ-9 Score 5     Review of Systems  HENT: Negative.   Eyes: Negative.   Respiratory: Negative.   Cardiovascular: Negative.   Gastrointestinal: Negative.   Endocrine: Negative.   Genitourinary: Negative.   Musculoskeletal: Positive for myalgias, back pain and arthralgias.  Skin: Negative.   Allergic/Immunologic: Negative.   Neurological: Positive for dizziness, weakness and numbness.       Tingling  Hematological: Negative.   Psychiatric/Behavioral: Positive for confusion and dysphoric mood. The patient is nervous/anxious.        Objective:   Physical  Exam  Constitutional: She is oriented to person, place, and time. She appears well-developed and well-nourished.  HENT:  Head: Normocephalic and atraumatic.  Neck: Normal range of motion. Neck supple.  Cervical Paraspinal Tenderness: C-3- C-5  Cardiovascular: Normal rate and regular rhythm.   Pulmonary/Chest: Effort normal and breath sounds normal.  Musculoskeletal:  Normal Muscle Bulk and Muscle Testing Reveals: Upper Extremities: Decreased ROM 90 Degrees and Muscle Strength 5/5 Thoracic and Lumbar Hypersensitivity Lower Extremities: Full ROM and Muscle Strength 5/5 Arises from chair with ease Narrow Based Gait    Neurological: She is alert and oriented to person, place, and time.  Skin: Skin is warm and dry.  Psychiatric: She has a normal mood and affect.  Nursing note and vitals reviewed.         Assessment & Plan:  1.Lumbar postlaminectomy syndrome status post L5-S1 fusion radiating to ZOX:WRUEAVWU: MS contin 30 mg # 90 pills--use one pill every 8 hours for pain  2. Depression: Continue Current Medication Regimen of Prozac and Wellbutrin.  3. Right Hand Neuropathy: Continue to Monitor 4. Memory Changes: Referral to Neurology  15 minutes of face to face patient care time was spent during this visit. All questions were encouraged and answered.   F/U in 1 month

## 2015-01-17 ENCOUNTER — Encounter: Payer: Self-pay | Admitting: Diagnostic Neuroimaging

## 2015-01-17 ENCOUNTER — Ambulatory Visit (INDEPENDENT_AMBULATORY_CARE_PROVIDER_SITE_OTHER): Payer: Medicare Other | Admitting: Diagnostic Neuroimaging

## 2015-01-17 VITALS — BP 105/72 | HR 74 | Ht 63.5 in | Wt 161.6 lb

## 2015-01-17 DIAGNOSIS — G894 Chronic pain syndrome: Secondary | ICD-10-CM

## 2015-01-17 DIAGNOSIS — R413 Other amnesia: Secondary | ICD-10-CM | POA: Diagnosis not present

## 2015-01-17 DIAGNOSIS — F32A Depression, unspecified: Secondary | ICD-10-CM

## 2015-01-17 DIAGNOSIS — F329 Major depressive disorder, single episode, unspecified: Secondary | ICD-10-CM

## 2015-01-17 DIAGNOSIS — F411 Generalized anxiety disorder: Secondary | ICD-10-CM

## 2015-01-17 NOTE — Progress Notes (Signed)
[facility]  PATIENT: Cindy Pratt DOB: 1966/07/23  REFERRING CLINICIAN: Marya Landry, NP HISTORY FROM: patient (daughter and grandson) REASON FOR VISIT: new consult    HISTORICAL  CHIEF COMPLAINT:  Chief Complaint  Patient presents with  . New Evaluation    worsening issues with short term memory loss     HISTORY OF PRESENT ILLNESS:   49 year old right-handed female here for evaluation of memory lapses. Patient has chronic pain, on long-term narcotics, depression, anxiety, bipolar, on mood stabilizing medications.  Patient reports at least 6 months of word finding difficulties and short-term memory problems. Her daughter thinks probably been going on for one year. Patient has been on pain and mood medications for at least 2 years. She has long mood disorder problems from age 44 years old related to family problems. Patient having more stress in her life in the last 1-2 years.  Patient fell in 1996, with resultant back injuries. She had surgeries in 1997 at 2003 for her back. She's been on long-term pain management since then.  Patient also has long standing sleep problems with interrupted sleep.   REVIEW OF SYSTEMS: Full 14 system review of systems performed and notable only for chest pain hearing loss birthmarks moles sorts of breath easy bruising feeling hot feeling cold joint pain joint swelling cramps aching muscles allergies memory loss confusion headache numbness weakness dizziness sleepiness depression anxiety decreased energy.   ALLERGIES: Allergies  Allergen Reactions  . Aspirin Shortness Of Breath  . Nsaids Shortness Of Breath  . Sulfonamide Derivatives Hives  . Cymbalta [Duloxetine Hcl] Other (See Comments)    confusion  . Gabapentin Other (See Comments)    Causes confusion  . Other     Aswanghda? Patient is not sure of spelling it is an herb  . Pregabalin     REACTION: confusion    HOME MEDICATIONS: Outpatient Prescriptions Prior to  Visit  Medication Sig Dispense Refill  . buPROPion (WELLBUTRIN SR) 150 MG 12 hr tablet Take 150 mg by mouth 2 (two) times daily.    Marland Kitchen esomeprazole (NEXIUM) 40 MG capsule Take 1 capsule (40 mg total) by mouth 2 (two) times daily. 60 capsule 11  . FLUoxetine (PROZAC) 20 MG tablet Take 20 mg by mouth daily.    Marland Kitchen lamoTRIgine (LAMICTAL) 100 MG tablet Take 100 mg by mouth daily. Current does unknown    . morphine (MS CONTIN) 30 MG 12 hr tablet Take 1 tablet (30 mg total) by mouth every 8 (eight) hours. 90 tablet 0  . Multiple Vitamin (MULITIVITAMIN WITH MINERALS) TABS Take 1 tablet by mouth daily.    Marland Kitchen tiZANidine (ZANAFLEX) 4 MG tablet Take 1 tablet (4 mg total) by mouth 2 (two) times daily. 60 tablet 5  . b complex vitamins tablet Take 1 tablet by mouth daily.      . Magnesium 250 MG TABS Take 500 mg by mouth daily.    Marland Kitchen OVER THE COUNTER MEDICATION calcium    . phentermine (ADIPEX-P) 37.5 MG tablet Take 37.5 mg by mouth daily before breakfast.     No facility-administered medications prior to visit.    PAST MEDICAL HISTORY: Past Medical History  Diagnosis Date  . Fibromyalgia   . Anxiety   . Depression   . Hyperlipidemia   . Asthma   . Degenerative joint disease   . Osteoarthritis   . Belchings   . Lumbar post-laminectomy syndrome   . Lumbosacral neuritis   . Disturbance of skin sensation   .  Sciatica   . Chronic pain syndrome   . Other chronic postoperative pain     PAST SURGICAL HISTORY: Past Surgical History  Procedure Laterality Date  . Back surgery  1997, 2003  . Abdominal hysterectomy    . Cystectomy    . Diagnostic laparoscopy      FAMILY HISTORY: Family History  Problem Relation Age of Onset  . Hypertension Mother   . Hypertension Father   . Breast cancer Maternal Grandmother   . Liver cancer Maternal Grandmother   . Cancer Maternal Grandmother     breast cancer  . Ovarian cancer Cousin   . Cancer Cousin     1st c. maternal side/ cervical cancer  . Diabetes  Maternal Grandfather     both sides  . Cancer Cousin     1st c maternal/ colon cancer    SOCIAL HISTORY:  History   Social History  . Marital Status: Married    Spouse Name: Velda ShellHubert  . Number of Children: 4  . Years of Education: M   Occupational History  . disabled    Social History Main Topics  . Smoking status: Former Smoker -- 1.00 packs/day for 20 years    Types: Cigarettes    Quit date: 07/31/2007  . Smokeless tobacco: Never Used  . Alcohol Use: No  . Drug Use: No  . Sexual Activity: Yes    Birth Control/ Protection: Surgical   Other Topics Concern  . Not on file   Social History Narrative   Lives at home with husband Velda ShellHubert   Drinks 4 sodas a day     PHYSICAL EXAM  Filed Vitals:   01/17/15 1043  BP: 105/72  Pulse: 74  Height: 5' 3.5" (1.613 m)  Weight: 161 lb 9.6 oz (73.301 kg)    Body mass index is 28.17 kg/(m^2).   Visual Acuity Screening   Right eye Left eye Both eyes  Without correction: 20/200 20/20   With correction:       MMSE - Mini Mental State Exam 01/17/2015  Orientation to time 5  Orientation to Place 5  Registration 3  Attention/ Calculation 4  Recall 2  Language- name 2 objects 2  Language- repeat 1  Language- follow 3 step command 3  Language- read & follow direction 1  Write a sentence 1  Copy design 1  Total score 28    GENERAL EXAM: Patient is in no distress; well developed, nourished and groomed; neck is supple  CARDIOVASCULAR: Regular rate and rhythm, no murmurs, no carotid bruits  NEUROLOGIC: MENTAL STATUS: awake, alert, oriented to person, place and time, recent and remote memory intact, normal attention and concentration, language fluent, comprehension intact, naming intact, fund of knowledge appropriate CRANIAL NERVE: no papilledema on fundoscopic exam, pupils equal and reactive to light, visual fields full to confrontation, extraocular muscles intact, no nystagmus, facial sensation and strength symmetric,  hearing intact, palate elevates symmetrically, uvula midline, shoulder shrug symmetric, tongue midline. MOTOR: normal bulk and tone, full strength in the BUE, BLE SENSORY: normal and symmetric to light touch, pinprick, temperature, vibration COORDINATION: finger-nose-finger, fine finger movement normal REFLEXES: deep tendon reflexes present and symmetric GAIT/STATION: narrow based gait; able to walk tandem; romberg is negative; ANTALGIA GAIT    DIAGNOSTIC DATA (LABS, IMAGING, TESTING) - I reviewed patient records, labs, notes, testing and imaging myself where available.  Lab Results  Component Value Date   WBC 11.4* 07/19/2011   HGB 14.0 07/19/2011   HCT 41.2 07/19/2011  MCV 87.1 07/19/2011   PLT 285 07/19/2011      Component Value Date/Time   NA 139 07/19/2011 1833   K 4.4 07/19/2011 1833   CL 103 07/19/2011 1833   CO2 28 07/19/2011 1833   GLUCOSE 114* 07/19/2011 1833   BUN 18 07/19/2011 1833   CREATININE 0.70 07/19/2011 1833   CALCIUM 10.0 07/19/2011 1833   PROT 7.3 07/19/2011 1833   ALBUMIN 4.0 07/19/2011 1833   AST 28 07/19/2011 1833   ALT 22 07/19/2011 1833   ALKPHOS 79 07/19/2011 1833   BILITOT 0.2* 07/19/2011 1833   GFRNONAA >90 07/19/2011 1833   GFRAA >90 07/19/2011 1833   Lab Results  Component Value Date   CHOL 218* 01/29/2009   HDL 67.10 01/29/2009   LDLDIRECT 128.7 01/29/2009   TRIG 102.0 01/29/2009   CHOLHDL 3 01/29/2009   No results found for: HGBA1C Lab Results  Component Value Date   VITAMINB12 487 01/29/2009   Lab Results  Component Value Date   TSH 0.45 05/26/2009     06/29/13 MRI lumbar [I reviewed images myself and agree with interpretation. -VRP]  - Status post L5-S1 fusion. No residual impingement at the fusion site. Consider CT lumbar spine without contrast if concern for pseudarthrosis. Stable mild annular bulging L4-5 and mild facet arthropathy, noncompressive.     ASSESSMENT AND PLAN  49 y.o. year old female here with  chronic pain and mood disorder, now with 6-12 months of memory lapses, headaches and short term memory loss.  Ddx: chronic pain, mood disrorder, medication side effect, CNS autoimmune/inflamm, metabolic  PLAN: - additional workup  Orders Placed This Encounter  Procedures  . MR Brain Wo Contrast  . Vitamin B12  . TSH   Return in about 6 weeks (around 02/28/2015).    Suanne MarkerVIKRAM R. PENUMALLI, MD 01/17/2015, 1:06 PM Certified in Neurology, Neurophysiology and Neuroimaging  [facility] [address], [address] [place], [place] [id number] [phone]

## 2015-01-17 NOTE — Patient Instructions (Signed)
I will check MRI scan and lab testing.

## 2015-01-18 LAB — TSH: TSH: 1.8 u[IU]/mL (ref 0.450–4.500)

## 2015-01-18 LAB — VITAMIN B12: Vitamin B-12: 1076 pg/mL — ABNORMAL HIGH (ref 211–946)

## 2015-01-31 ENCOUNTER — Encounter: Payer: Medicare Other | Attending: Registered Nurse

## 2015-01-31 ENCOUNTER — Encounter: Payer: Self-pay | Admitting: Physical Medicine & Rehabilitation

## 2015-01-31 ENCOUNTER — Ambulatory Visit
Admission: RE | Admit: 2015-01-31 | Discharge: 2015-01-31 | Disposition: A | Discharge status: Home / Self Care | Payer: Medicare Other | Source: Ambulatory Visit | Attending: Diagnostic Neuroimaging | Admitting: Diagnostic Neuroimaging

## 2015-01-31 ENCOUNTER — Ambulatory Visit (HOSPITAL_BASED_OUTPATIENT_CLINIC_OR_DEPARTMENT_OTHER): Payer: Medicare Other | Admitting: Physical Medicine & Rehabilitation

## 2015-01-31 VITALS — BP 116/67 | HR 69 | Resp 14

## 2015-01-31 DIAGNOSIS — R413 Other amnesia: Secondary | ICD-10-CM | POA: Diagnosis not present

## 2015-01-31 DIAGNOSIS — G8929 Other chronic pain: Secondary | ICD-10-CM | POA: Diagnosis present

## 2015-01-31 DIAGNOSIS — G894 Chronic pain syndrome: Secondary | ICD-10-CM | POA: Diagnosis not present

## 2015-01-31 DIAGNOSIS — Z981 Arthrodesis status: Secondary | ICD-10-CM | POA: Diagnosis not present

## 2015-01-31 DIAGNOSIS — J45909 Unspecified asthma, uncomplicated: Secondary | ICD-10-CM | POA: Insufficient documentation

## 2015-01-31 DIAGNOSIS — M1990 Unspecified osteoarthritis, unspecified site: Secondary | ICD-10-CM | POA: Insufficient documentation

## 2015-01-31 DIAGNOSIS — G629 Polyneuropathy, unspecified: Secondary | ICD-10-CM | POA: Insufficient documentation

## 2015-01-31 DIAGNOSIS — F411 Generalized anxiety disorder: Secondary | ICD-10-CM

## 2015-01-31 DIAGNOSIS — F32A Depression, unspecified: Secondary | ICD-10-CM

## 2015-01-31 DIAGNOSIS — M797 Fibromyalgia: Secondary | ICD-10-CM | POA: Diagnosis not present

## 2015-01-31 DIAGNOSIS — Z87891 Personal history of nicotine dependence: Secondary | ICD-10-CM | POA: Insufficient documentation

## 2015-01-31 DIAGNOSIS — F329 Major depressive disorder, single episode, unspecified: Secondary | ICD-10-CM | POA: Insufficient documentation

## 2015-01-31 DIAGNOSIS — M961 Postlaminectomy syndrome, not elsewhere classified: Secondary | ICD-10-CM | POA: Insufficient documentation

## 2015-01-31 DIAGNOSIS — E785 Hyperlipidemia, unspecified: Secondary | ICD-10-CM | POA: Insufficient documentation

## 2015-01-31 MED ORDER — MORPHINE SULFATE ER 30 MG PO TBCR: 30.0000 mg | EXTENDED_RELEASE_TABLET | Freq: Three times a day (TID) | ORAL | Status: DC

## 2015-01-31 NOTE — Progress Notes (Signed)
Subjective:    Patient ID: Cindy Pratt, female    DOB: [birth year], 49 y.o.   MRN: [id number]  HPI 49 year old female with bipolar disorder who has chronic low back pain  Status post L5-S1 fusion in 2003. More recently she is complained of memory problems. She is being evaluated by neurology. She has a scheduled MRI of the brain today. She's had no falls no problems with ambulation.  She is accompanied by her daughter as well as her grandson. B12 and TSH were normal   Pain Inventory Average Pain 9 Pain Right Now 9 My pain is constant, sharp, burning, dull, stabbing, tingling and aching  In the last 24 hours, has pain interfered with the following? General activity 8 Relation with others 8 Enjoyment of life 8 What TIME of day is your pain at its worst? all Sleep (in general) Poor  Pain is worse with: walking, bending, sitting, standing and some activites Pain improves with: rest and medication Relief from Meds: 7  Mobility Do you have any goals in this area?  no  Function Do you have any goals in this area?  no  Neuro/Psych weakness tremor dizziness confusion depression anxiety  Prior Studies Any changes since last visit?  no  Physicians involved in your care Any changes since last visit?  no   Family History  Problem Relation Age of Onset  . Hypertension Mother   . Hypertension Father   . Breast cancer Maternal Grandmother   . Liver cancer Maternal Grandmother   . Cancer Maternal Grandmother     breast cancer  . Ovarian cancer Cousin   . Cancer Cousin     1st c. maternal side/ cervical cancer  . Diabetes Maternal Grandfather     both sides  . Cancer Cousin     1st c maternal/ colon cancer   History   Social History  . Marital Status: Married    Spouse Name: [name]  . Number of Children: 4  . Years of Education: M   Occupational History  . disabled    Social History Main Topics  . Smoking status: Former Smoker -- 1.00 packs/day for 20  years    Types: Cigarettes    Quit date: 07/31/2007  . Smokeless tobacco: Never Used  . Alcohol Use: No  . Drug Use: No  . Sexual Activity: Yes    Birth Control/ Protection: Surgical   Other Topics Concern  . None   Social History Narrative   Lives at home with husband [name]   Drinks 4 sodas a day   Past Surgical History  Procedure Laterality Date  . Back surgery  1997, 2003  . Abdominal hysterectomy    . Cystectomy    . Diagnostic laparoscopy     Past Medical History  Diagnosis Date  . Fibromyalgia   . Anxiety   . Depression   . Hyperlipidemia   . Asthma   . Degenerative joint disease   . Osteoarthritis   . Belchings   . Lumbar post-laminectomy syndrome   . Lumbosacral neuritis   . Disturbance of skin sensation   . Sciatica   . Chronic pain syndrome   . Other chronic postoperative pain    BP 116/67 mmHg  Pulse 69  Resp 14  SpO2 97%  Opioid Risk Score:   Fall Risk Score: Low Fall Risk (0-5 points)`1  Depression screen PHQ 2/9  Depression screen PHQ 2/9 12/02/2014  Decreased Interest 1  Down, Depressed, Hopeless 1  PHQ -  2 Score 2  Altered sleeping 1  Tired, decreased energy 1  Change in appetite 0  Feeling bad or failure about yourself  0  Trouble concentrating 1  Moving slowly or fidgety/restless 0  Suicidal thoughts 0  PHQ-9 Score 5     Review of Systems  HENT: Negative.   Eyes: Negative.   Respiratory: Negative.   Cardiovascular: Negative.   Gastrointestinal: Negative.   Endocrine: Negative.   Musculoskeletal: Negative.   Skin: Negative.   Allergic/Immunologic: Negative.   Neurological: Positive for dizziness, tremors and weakness.  Hematological: Negative.   Psychiatric/Behavioral: Positive for confusion and dysphoric mood. The patient is nervous/anxious.   All other systems reviewed and are negative.      Objective:   Physical Exam  Constitutional: She is oriented to person, place, and time. She appears well-developed and  well-nourished.  HENT:  Head: Normocephalic and atraumatic.  Neurological: She is alert and oriented to person, place, and time. No sensory deficit. Gait normal.  Reflex Scores:      Tricep reflexes are 2+ on the right side and 3+ on the left side.      Bicep reflexes are 2+ on the right side and 3+ on the left side.      Brachioradialis reflexes are 2+ on the right side and 3+ on the left side.      Patellar reflexes are 2+ on the right side and 2+ on the left side.      Achilles reflexes are 2+ on the right side and 2+ on the left side. Positive Hoffmann's on the left side Motor strength is 5/5 bilateral deltoid, biceps, triceps, grip, hip flexor, knee extensor, ankle dorsiflexor and plantar flexor  Sensation pinprick normal in upper and lower limbs  Psychiatric: She has a normal mood and affect.  Nursing note and vitals reviewed.  No tenderness to palpation in the cervical thoracic lumbar paraspinal muscles. Normal cervical range of motion Reduced lumbar range of motion Negative straight leg raising test       Assessment & Plan:  1. Lumbar postlaminectomy syndrome with chronic postoperative pain, patient has been on long-term narcotic analgesics. She's had no signs of aberrant drug behavior. She is asking for increased pain medication for pain that is all over her body. She cannot localize the increased pain. I have recommended that she follows up with neurology. If there is no new issue with further neurologic workup may consider other adjuvant pain medications. She may be experiencing some central sensitization. Unfortunately she has not tolerated gabapentin or pregabalin in the past.  No signs of aberrant drug behavior Continue opioid monitoring program. This consists of regular clinic visits, examinations, urine drug screen, pill counts as well as use of [place] controlled substance reporting System.  Continue morphine sulfate30 mg extended release 3 times per  day. Reluctant to increase this since that would put her over the threshold for high-dose narcotic analgesics, increasing overdose risk.  2. Fibromyalgia syndrome her widespread body aches seem more consistent with this diagnosis. I do not see any physical signs of tenderness however. 3. Left upper extremity increased deep tendon reflexes. Could be a sign of cervical stenosis. Once again we'll ask neurology to further evaluate, MRI of the brain may reveal some corresponding lesion.

## 2015-01-31 NOTE — Patient Instructions (Signed)
MD visit in one month will review what Neurology recommends

## 2015-02-24 ENCOUNTER — Other Ambulatory Visit: Payer: Self-pay

## 2015-02-24 DIAGNOSIS — J449 Chronic obstructive pulmonary disease, unspecified: Secondary | ICD-10-CM | POA: Insufficient documentation

## 2015-02-25 ENCOUNTER — Ambulatory Visit (HOSPITAL_BASED_OUTPATIENT_CLINIC_OR_DEPARTMENT_OTHER): Payer: Medicare Other | Admitting: Physical Medicine & Rehabilitation

## 2015-02-25 ENCOUNTER — Encounter: Payer: Self-pay | Admitting: Physical Medicine & Rehabilitation

## 2015-02-25 VITALS — BP 105/75 | HR 64 | Resp 14

## 2015-02-25 DIAGNOSIS — M797 Fibromyalgia: Secondary | ICD-10-CM

## 2015-02-25 DIAGNOSIS — M961 Postlaminectomy syndrome, not elsewhere classified: Secondary | ICD-10-CM

## 2015-02-25 MED ORDER — MORPHINE SULFATE ER 30 MG PO TBCR: 30.0000 mg | EXTENDED_RELEASE_TABLET | Freq: Three times a day (TID) | ORAL | Status: DC

## 2015-02-25 NOTE — Progress Notes (Signed)
Subjective:    Patient ID: Cindy Pratt, female    DOB: 1965-12-27, 49 y.o.   MRN: [id number] Chief complaint is pain all over HPI 49 year old female with history of chronic low back pain and she has a history of L5-S1 fusion. She has some pain in the lateral border of the foot  (right but no numbness tingling in that area. Has had some memory problems has followed up with neurology and MRI of the brain was performed. Neurology follow-up is in 1 week. No constipation from the morphine. No laxative use.  Patient has tried gabapentin and Lyrica in the past could not tolerate these medicationsAlso could not tolerate Cymbalta We discussed that the pain all over suggestive of fibromyalgia syndrome. Other possibilities include undertreated depression. She did call her psychiatrist to see if her depression medications could be adjusted Pain Inventory Average Pain 6 Pain Right Now 6 My pain is constant, sharp, burning, dull, stabbing, tingling and aching  In the last 24 hours, has pain interfered with the following? General activity 8 Relation with others 0 Enjoyment of life 8 What TIME of day is your pain at its worst? varies Sleep (in general) Poor  Pain is worse with: walking, bending, sitting, standing and some activites Pain improves with: medication Relief from Meds: 9  Mobility walk without assistance Do you have any goals in this area?  no  Function Do you have any goals in this area?  no  Neuro/Psych weakness numbness tingling dizziness confusion depression anxiety  Prior Studies Any changes since last visit?  no  Physicians involved in your care Any changes since last visit?  no   Family History  Problem Relation Age of Onset  . Hypertension Mother   . Hypertension Father   . Breast cancer Maternal Grandmother   . Liver cancer Maternal Grandmother   . Cancer Maternal Grandmother     breast cancer  . Ovarian cancer Cousin   . Cancer Cousin     1st c.  maternal side/ cervical cancer  . Diabetes Maternal Grandfather     both sides  . Cancer Cousin     1st c maternal/ colon cancer   History   Social History  . Marital Status: Married    Spouse Name: [name]  . Number of Children: 4  . Years of Education: M   Occupational History  . disabled    Social History Main Topics  . Smoking status: Former Smoker -- 1.00 packs/day for 20 years    Types: Cigarettes    Quit date: 07/31/2007  . Smokeless tobacco: Never Used  . Alcohol Use: No  . Drug Use: No  . Sexual Activity: Yes    Birth Control/ Protection: Surgical   Other Topics Concern  . None   Social History Narrative   Lives at home with husband [name]   Drinks 4 sodas a day   Past Surgical History  Procedure Laterality Date  . Back surgery  1997, 2003  . Abdominal hysterectomy    . Cystectomy    . Diagnostic laparoscopy     Past Medical History  Diagnosis Date  . Fibromyalgia   . Anxiety   . Depression   . Hyperlipidemia   . Asthma   . Degenerative joint disease   . Osteoarthritis   . Belchings   . Lumbar post-laminectomy syndrome   . Lumbosacral neuritis   . Disturbance of skin sensation   . Sciatica   . Chronic pain syndrome   .  Other chronic postoperative pain    BP 105/75 mmHg  Pulse 64  Resp 14  SpO2 99%  Opioid Risk Score:   Fall Risk Score: Low Fall Risk (0-5 points)`1  Depression screen PHQ 2/9  Depression screen PHQ 2/9 12/02/2014  Decreased Interest 1  Down, Depressed, Hopeless 1  PHQ - 2 Score 2  Altered sleeping 1  Tired, decreased energy 1  Change in appetite 0  Feeling bad or failure about yourself  0  Trouble concentrating 1  Moving slowly or fidgety/restless 0  Suicidal thoughts 0  PHQ-9 Score 5     Review of Systems  Neurological: Positive for dizziness, weakness and numbness.       Tingling  Psychiatric/Behavioral: Positive for confusion and dysphoric mood. The patient is nervous/anxious.   All other systems reviewed  and are negative.      Objective:   Physical Exam  Constitutional: She is oriented to person, place, and time. She appears well-developed and well-nourished.  HENT:  Head: Normocephalic and atraumatic.  Eyes: Conjunctivae are normal. Pupils are equal, round, and reactive to light.  Neurological: She is alert and oriented to person, place, and time.  Nursing note and vitals reviewed.  Low back has some tenderness along the lumbar paraspinal muscles she is reduced lumbar range of motion Negative straight leg raising test She has tenderness palpation along the upper trapezius and cervical area low back and hip area.      Assessment & Plan:  1.Lumbar postlaminectomy syndrome status post L5-S1 fusion radiating to [id number]:Refilled: MS contin 30 mg # 90 pills--use one pill every 8 hours for pain  2. Depression: Continue Current Medication Regimen of Prozac.  15 minutes of face to face patient care time was spent during this visit. All questions were encouraged and answered.  3. Fibromyalgia syndrome doing well in regards to this. Likely getting benefit from the Prozac as well as Wellbutrin. F/U in 1 month, NP.

## 2015-02-27 ENCOUNTER — Telehealth: Payer: Self-pay | Admitting: *Deleted

## 2015-02-27 NOTE — Telephone Encounter (Signed)
Unable to leave a message on the pts phone. Will mail her the MRI results with explanation

## 2015-03-05 ENCOUNTER — Ambulatory Visit (INDEPENDENT_AMBULATORY_CARE_PROVIDER_SITE_OTHER): Payer: Medicare Other | Admitting: Diagnostic Neuroimaging

## 2015-03-05 ENCOUNTER — Encounter: Payer: Self-pay | Admitting: Diagnostic Neuroimaging

## 2015-03-05 VITALS — BP 111/77 | HR 67 | Ht 63.5 in | Wt 165.2 lb

## 2015-03-05 DIAGNOSIS — F411 Generalized anxiety disorder: Secondary | ICD-10-CM

## 2015-03-05 DIAGNOSIS — G894 Chronic pain syndrome: Secondary | ICD-10-CM | POA: Diagnosis not present

## 2015-03-05 DIAGNOSIS — F32A Depression, unspecified: Secondary | ICD-10-CM

## 2015-03-05 DIAGNOSIS — R413 Other amnesia: Secondary | ICD-10-CM | POA: Diagnosis not present

## 2015-03-05 DIAGNOSIS — F329 Major depressive disorder, single episode, unspecified: Secondary | ICD-10-CM

## 2015-03-05 NOTE — Progress Notes (Signed)
[facility]  PATIENT: Cindy Pratt DOB: Mar 30, 1966  REFERRING CLINICIAN: Marya Landry, NP HISTORY FROM: patient and husband REASON FOR VISIT: follow up    HISTORICAL  CHIEF COMPLAINT:  Chief Complaint  Patient presents with  . Memory Loss    rm 7, husband [name], MMSE 26  . Follow-up    HISTORY OF PRESENT ILLNESS:   UPDATE 03/05/15: Since last visit, doing well/ stable. Memory loss stable. Pain and mood stable. On treatment.   PRIOR HPI (01/17/15): 49 year old right-handed female here for evaluation of memory lapses. Patient has chronic pain, on long-term narcotics, depression, anxiety, bipolar, on mood stabilizing medications. Patient reports at least 6 months of word finding difficulties and short-term memory problems. Her daughter thinks probably been going on for one year. Patient has been on pain and mood medications for at least 2 years. She has long mood disorder problems from age 37 years old related to family problems. Patient having more stress in her life in the last 1-2 years.  Patient fell in 1996, with resultant back injuries. She had surgeries in 1997 at 2003 for her back. She's been on long-term pain management since then. Patient also has long standing sleep problems with interrupted sleep.   REVIEW OF SYSTEMS: Full 14 system review of systems performed and notable only for excess sweating shortness of breath chest tightness palpitations sleep talking neck pain back pain joint pain confusion depression anxiety.    ALLERGIES: Allergies  Allergen Reactions  . Aspirin Shortness Of Breath  . Nsaids Shortness Of Breath  . Sulfonamide Derivatives Hives  . Cymbalta [Duloxetine Hcl] Other (See Comments)    confusion  . Gabapentin Other (See Comments)    Causes confusion  . Other     Aswanghda? Patient is not sure of spelling it is an herb  . Pregabalin     REACTION: confusion    HOME MEDICATIONS: Outpatient Prescriptions Prior to Visit    Medication Sig Dispense Refill  . buPROPion (WELLBUTRIN SR) 150 MG 12 hr tablet Take 150 mg by mouth 2 (two) times daily.    Marland Kitchen FLUoxetine (PROZAC) 20 MG tablet Take 20 mg by mouth daily.    Marland Kitchen lamoTRIgine (LAMICTAL) 100 MG tablet Take 100 mg by mouth daily. Current does unknown    . morphine (MS CONTIN) 30 MG 12 hr tablet Take 1 tablet (30 mg total) by mouth every 8 (eight) hours. 90 tablet 0  . Multiple Vitamin (MULITIVITAMIN WITH MINERALS) TABS Take 1 tablet by mouth daily.    Marland Kitchen tiZANidine (ZANAFLEX) 4 MG tablet Take 1 tablet (4 mg total) by mouth 2 (two) times daily. 60 tablet 5  . esomeprazole (NEXIUM) 40 MG capsule Take 1 capsule (40 mg total) by mouth 2 (two) times daily. (Patient not taking: Reported on 03/05/2015) 60 capsule 11   No facility-administered medications prior to visit.    PAST MEDICAL HISTORY: Past Medical History  Diagnosis Date  . Fibromyalgia   . Anxiety   . Depression   . Hyperlipidemia   . Asthma   . Degenerative joint disease   . Osteoarthritis   . Belchings   . Lumbar post-laminectomy syndrome   . Lumbosacral neuritis   . Disturbance of skin sensation   . Sciatica   . Chronic pain syndrome   . Other chronic postoperative pain     PAST SURGICAL HISTORY: Past Surgical History  Procedure Laterality Date  . Back surgery  1997, 2003  . Abdominal hysterectomy    .  Cystectomy    . Diagnostic laparoscopy      FAMILY HISTORY: Family History  Problem Relation Age of Onset  . Hypertension Mother   . Hypertension Father   . Breast cancer Maternal Grandmother   . Liver cancer Maternal Grandmother   . Cancer Maternal Grandmother     breast cancer  . Ovarian cancer Cousin   . Cancer Cousin     1st c. maternal side/ cervical cancer  . Diabetes Maternal Grandfather     both sides  . Cancer Cousin     1st c maternal/ colon cancer    SOCIAL HISTORY:  History   Social History  . Marital Status: Married    Spouse Name: Velda ShellHubert  . Number of  Children: 4  . Years of Education: M   Occupational History  . disabled    Social History Main Topics  . Smoking status: Former Smoker -- 1.00 packs/day for 20 years    Types: Cigarettes    Quit date: 07/31/2007  . Smokeless tobacco: Never Used  . Alcohol Use: No  . Drug Use: No  . Sexual Activity: Yes    Birth Control/ Protection: Surgical   Other Topics Concern  . Not on file   Social History Narrative   Lives at home with husband Velda ShellHubert   Drinks 4 sodas a day     PHYSICAL EXAM  Filed Vitals:   03/05/15 1515  BP: 111/77  Pulse: 67  Height: 5' 3.5" (1.613 m)  Weight: 165 lb 3.2 oz (74.934 kg)    Body mass index is 28.8 kg/(m^2).  No exam data present  MMSE - Mini Mental State Exam 03/05/2015 01/17/2015  Orientation to time 5 5  Orientation to Place 5 5  Registration 3 3  Attention/ Calculation 2 4  Recall 3 2  Language- name 2 objects 2 2  Language- repeat 0 1  Language- follow 3 step command 3 3  Language- read & follow direction 1 1  Write a sentence 1 1  Copy design 1 1  Total score 26 28    GENERAL EXAM: Patient is in no distress; well developed, nourished and groomed; neck is supple  CARDIOVASCULAR: Regular rate and rhythm, no murmurs, no carotid bruits  NEUROLOGIC: MENTAL STATUS: awake, alert, language fluent, comprehension intact, naming intact, fund of knowledge appropriate CRANIAL NERVE: pupils equal and reactive to light, visual fields full to confrontation, extraocular muscles intact, no nystagmus, facial sensation and strength symmetric, hearing intact, palate elevates symmetrically, uvula midline, shoulder shrug symmetric, tongue midline. MOTOR: normal bulk and tone, full strength in the BUE, BLE SENSORY: normal and symmetric to light touch, pinprick, temperature, vibration COORDINATION: finger-nose-finger, fine finger movement normal REFLEXES: deep tendon reflexes present and symmetric GAIT/STATION: narrow based gait; able to walk tandem;  romberg is negative; ANTALGIA GAIT    DIAGNOSTIC DATA (LABS, IMAGING, TESTING) - I reviewed patient records, labs, notes, testing and imaging myself where available.  Lab Results  Component Value Date   WBC 11.4* 07/19/2011   HGB 14.0 07/19/2011   HCT 41.2 07/19/2011   MCV 87.1 07/19/2011   PLT 285 07/19/2011      Component Value Date/Time   NA 139 07/19/2011 1833   K 4.4 07/19/2011 1833   CL 103 07/19/2011 1833   CO2 28 07/19/2011 1833   GLUCOSE 114* 07/19/2011 1833   BUN 18 07/19/2011 1833   CREATININE 0.70 07/19/2011 1833   CALCIUM 10.0 07/19/2011 1833   PROT 7.3 07/19/2011 1833  ALBUMIN 4.0 07/19/2011 1833   AST 28 07/19/2011 1833   ALT 22 07/19/2011 1833   ALKPHOS 79 07/19/2011 1833   BILITOT 0.2* 07/19/2011 1833   GFRNONAA >90 07/19/2011 1833   GFRAA >90 07/19/2011 1833   Lab Results  Component Value Date   CHOL 218* 01/29/2009   HDL 67.10 01/29/2009   LDLDIRECT 128.7 01/29/2009   TRIG 102.0 01/29/2009   CHOLHDL 3 01/29/2009   No results found for: HGBA1C Lab Results  Component Value Date   VITAMINB12 1076* 01/17/2015   Lab Results  Component Value Date   TSH 1.800 01/17/2015     06/29/13 MRI lumbar [I reviewed images myself and agree with interpretation. -VRP]  - Status post L5-S1 fusion. No residual impingement at the fusion site. Consider CT lumbar spine without contrast if concern for pseudarthrosis. Stable mild annular bulging L4-5 and mild facet arthropathy, noncompressive.  01/31/15 MRI brain [I reviewed images myself and agree with interpretation. Likely chronic small vessel ischemic disease from prior smoking and current hyperlipidemia. -VRP]  - scattered tiny periventricular and subcortical supratentorial tiny white matter hyperintensities which are nonspecific with differential discussed above.    ASSESSMENT AND PLAN  49 y.o. year old female here with chronic pain and mood disorder, now with 6-12 months of memory lapses, headaches and  short term memory loss.  [id number] lapses due to chronic pain, mood disrorder, medication side effect and sleep distrubances   PLAN: I spent 15 minutes of face to face time with patient. Greater than 50% of time was spent in counseling and coordination of care with patient. In summary we discussed:  - follow up with PCP and pain mgmt; consider psychiatry/psychology evaluation  Return for return to PCP.    Suanne Marker, MD 03/05/2015, 3:33 PM Certified in Neurology, Neurophysiology and Neuroimaging  [facility] [address], [address] [place], [place] [address] [phone]

## 2015-03-26 ENCOUNTER — Other Ambulatory Visit: Payer: Self-pay | Admitting: Physical Medicine & Rehabilitation

## 2015-03-26 ENCOUNTER — Encounter: Payer: Medicare Other | Attending: Registered Nurse | Admitting: Registered Nurse

## 2015-03-26 ENCOUNTER — Encounter: Payer: Self-pay | Admitting: Registered Nurse

## 2015-03-26 VITALS — BP 128/64 | HR 78 | Resp 14

## 2015-03-26 DIAGNOSIS — Z5181 Encounter for therapeutic drug level monitoring: Secondary | ICD-10-CM

## 2015-03-26 DIAGNOSIS — F329 Major depressive disorder, single episode, unspecified: Secondary | ICD-10-CM | POA: Insufficient documentation

## 2015-03-26 DIAGNOSIS — G894 Chronic pain syndrome: Secondary | ICD-10-CM

## 2015-03-26 DIAGNOSIS — Z981 Arthrodesis status: Secondary | ICD-10-CM | POA: Insufficient documentation

## 2015-03-26 DIAGNOSIS — M797 Fibromyalgia: Secondary | ICD-10-CM | POA: Diagnosis not present

## 2015-03-26 DIAGNOSIS — M1990 Unspecified osteoarthritis, unspecified site: Secondary | ICD-10-CM | POA: Diagnosis not present

## 2015-03-26 DIAGNOSIS — M961 Postlaminectomy syndrome, not elsewhere classified: Secondary | ICD-10-CM | POA: Diagnosis not present

## 2015-03-26 DIAGNOSIS — E785 Hyperlipidemia, unspecified: Secondary | ICD-10-CM | POA: Insufficient documentation

## 2015-03-26 DIAGNOSIS — Z79899 Other long term (current) drug therapy: Secondary | ICD-10-CM | POA: Diagnosis not present

## 2015-03-26 DIAGNOSIS — J45909 Unspecified asthma, uncomplicated: Secondary | ICD-10-CM | POA: Insufficient documentation

## 2015-03-26 DIAGNOSIS — G629 Polyneuropathy, unspecified: Secondary | ICD-10-CM | POA: Insufficient documentation

## 2015-03-26 DIAGNOSIS — G8929 Other chronic pain: Secondary | ICD-10-CM | POA: Diagnosis present

## 2015-03-26 DIAGNOSIS — Z87891 Personal history of nicotine dependence: Secondary | ICD-10-CM | POA: Insufficient documentation

## 2015-03-26 MED ORDER — MORPHINE SULFATE ER 30 MG PO TBCR: 30.0000 mg | EXTENDED_RELEASE_TABLET | Freq: Three times a day (TID) | ORAL | Status: DC

## 2015-03-26 NOTE — Progress Notes (Signed)
Subjective:    Patient ID: Cindy Pratt, female    DOB: August 10, 1966, 49 y.o.   MRN: [id number]  HPI: Cindy Pratt is a 49 year old female who returns for follow up for chronic pain and medication refill. She's complaining of generalized pain, fibro pain and headaches. She rates her pain 7. Her current exercise regime is walking. Also states she will be staying with her mother for a few weeks she wouldn't elaborate on the reason. Will be using a different pharmacy during this time.  Pain Inventory Average Pain 9 Pain Right Now 7 My pain is intermittent, sharp, burning, dull, stabbing, tingling and aching  In the last 24 hours, has pain interfered with the following? General activity 9 Relation with others 9 Enjoyment of life 9 What TIME of day is your pain at its worst? morning and night Sleep (in general) Poor  Pain is worse with: walking, bending, sitting, standing and some activites Pain improves with: medication Relief from Meds: 5  Mobility walk without assistance how many minutes can you walk? 15 ability to climb steps?  yes do you drive?  yes  Function disabled: date disabled .  Neuro/Psych weakness numbness tingling dizziness confusion depression anxiety  Prior Studies Any changes since last visit?  no  Physicians involved in your care Any changes since last visit?  no   Family History  Problem Relation Age of Onset  . Hypertension Mother   . Hypertension Father   . Breast cancer Maternal Grandmother   . Liver cancer Maternal Grandmother   . Cancer Maternal Grandmother     breast cancer  . Ovarian cancer Cousin   . Cancer Cousin     1st c. maternal side/ cervical cancer  . Diabetes Maternal Grandfather     both sides  . Cancer Cousin     1st c maternal/ colon cancer   History   Social History  . Marital Status: Married    Spouse Name: [name]  . Number of Children: 4  . Years of Education: M   Occupational History  . disabled     Social History Main Topics  . Smoking status: Former Smoker -- 1.00 packs/day for 20 years    Types: Cigarettes    Quit date: 07/31/2007  . Smokeless tobacco: Never Used  . Alcohol Use: No  . Drug Use: No  . Sexual Activity: Yes    Birth Control/ Protection: Surgical   Other Topics Concern  . None   Social History Narrative   Lives at home with husband [name]   Drinks 4 sodas a day   Past Surgical History  Procedure Laterality Date  . Back surgery  1997, 2003  . Abdominal hysterectomy    . Cystectomy    . Diagnostic laparoscopy     Past Medical History  Diagnosis Date  . Fibromyalgia   . Anxiety   . Depression   . Hyperlipidemia   . Asthma   . Degenerative joint disease   . Osteoarthritis   . Belchings   . Lumbar post-laminectomy syndrome   . Lumbosacral neuritis   . Disturbance of skin sensation   . Sciatica   . Chronic pain syndrome   . Other chronic postoperative pain    BP 128/64 mmHg  Pulse 78  Resp 14  SpO2 97%  Opioid Risk Score:   Fall Risk Score:  `1  Depression screen PHQ 2/9  Depression screen PHQ 2/9 12/02/2014  Decreased Interest 1  Down,  Depressed, Hopeless 1  PHQ - 2 Score 2  Altered sleeping 1  Tired, decreased energy 1  Change in appetite 0  Feeling bad or failure about yourself  0  Trouble concentrating 1  Moving slowly or fidgety/restless 0  Suicidal thoughts 0  PHQ-9 Score 5     Review of Systems  Constitutional: Positive for unexpected weight change.       Weight gain  Eyes: Negative.   Respiratory: Positive for cough, shortness of breath and wheezing.   Cardiovascular: Negative.   Gastrointestinal: Positive for nausea.  Endocrine: Negative.   Genitourinary: Negative.   Musculoskeletal: Positive for myalgias, back pain, arthralgias and neck pain.  Skin: Negative.   Allergic/Immunologic: Negative.   Neurological: Positive for dizziness, weakness, numbness and headaches.       Tingling  Hematological: Negative.    Psychiatric/Behavioral: Positive for confusion and dysphoric mood. The patient is nervous/anxious.        Objective:   Physical Exam  Constitutional: She is oriented to person, place, and time. She appears well-developed and well-nourished.  HENT:  Head: Normocephalic and atraumatic.  Neck: Normal range of motion. Neck supple.  Cervical Paraspinal Tenderness: C-5- C-7  Cardiovascular: Normal rate and regular rhythm.   Pulmonary/Chest: Effort normal and breath sounds normal.  Musculoskeletal:  Normal Muscle Bulk and Muscle Testing Reveals: Upper Extremities: Full ROM and Muscle Strength 5/5 Thoracic Paraspinal Tenderness: T-3- T-6 Lower Extremities: Full ROM and Muscle strength 5/5 Arises from chair with ease Narrow Based gait  Neurological: She is alert and oriented to person, place, and time.  Skin: Skin is warm and dry.  Psychiatric: She has a normal mood and affect.  Nursing note and vitals reviewed.         Assessment & Plan:  1.Lumbar postlaminectomy syndrome status post L5-S1 fusion radiating to ZOX:WRUEAVWU: MS contin 30 mg # 90 pills--use one pill every 8 hours for pain  2. Depression: Continue Current Medication Regimen of Prozac and Wellbutrin.   15 minutes of face to face patient care time was spent during this visit. All questions were encouraged and answered.

## 2015-03-27 LAB — PMP ALCOHOL METABOLITE (ETG): Ethyl Glucuronide (EtG): NEGATIVE ng/mL

## 2015-03-29 LAB — OPIATES/OPIOIDS (LC/MS-MS)
Codeine Urine: NEGATIVE ng/mL (ref <50)
Hydrocodone: NEGATIVE ng/mL (ref <50)
Hydromorphone: 82 ng/mL (ref <50)
Morphine Urine: 7800 ng/mL (ref <50)
NORHYDROCODONE, UR: NEGATIVE ng/mL (ref <50)
NOROXYCODONE, UR: NEGATIVE ng/mL (ref <50)
OXYCODONE, UR: NEGATIVE ng/mL (ref <50)
Oxymorphone: NEGATIVE ng/mL (ref <50)

## 2015-03-31 LAB — PRESCRIPTION MONITORING PROFILE (SOLSTAS)
Amphetamine/Meth: NEGATIVE ng/mL
Barbiturate Screen, Urine: NEGATIVE ng/mL
Benzodiazepine Screen, Urine: NEGATIVE ng/mL
Buprenorphine, Urine: NEGATIVE ng/mL
CANNABINOID SCRN UR: NEGATIVE ng/mL
COCAINE METABOLITES: NEGATIVE ng/mL
CREATININE, URINE: 42.88 mg/dL (ref >20.0)
Carisoprodol, Urine: NEGATIVE ng/mL
ECSTASY: NEGATIVE ng/mL
Fentanyl, Ur: NEGATIVE ng/mL
Meperidine, Ur: NEGATIVE ng/mL
Methadone Screen, Urine: NEGATIVE ng/mL
NITRITES URINE, INITIAL: NEGATIVE ug/mL
Oxycodone Screen, Ur: NEGATIVE ng/mL
PH URINE, INITIAL: 5.7 pH (ref 4.5–8.9)
Propoxyphene: NEGATIVE ng/mL
Tapentadol, urine: NEGATIVE ng/mL
Tramadol Scrn, Ur: NEGATIVE ng/mL
ZOLPIDEM, URINE: NEGATIVE ng/mL

## 2015-04-17 ENCOUNTER — Emergency Department (HOSPITAL_COMMUNITY)
Admission: EM | Admit: 2015-04-17 | Discharge: 2015-04-17 | Disposition: A | Discharge status: Home / Self Care | Payer: Medicare Other | Attending: Emergency Medicine | Admitting: Emergency Medicine

## 2015-04-17 ENCOUNTER — Emergency Department (HOSPITAL_COMMUNITY): Payer: Medicare Other

## 2015-04-17 ENCOUNTER — Encounter (HOSPITAL_COMMUNITY): Payer: Self-pay | Admitting: Emergency Medicine

## 2015-04-17 DIAGNOSIS — F329 Major depressive disorder, single episode, unspecified: Secondary | ICD-10-CM | POA: Insufficient documentation

## 2015-04-17 DIAGNOSIS — M797 Fibromyalgia: Secondary | ICD-10-CM | POA: Insufficient documentation

## 2015-04-17 DIAGNOSIS — Z87891 Personal history of nicotine dependence: Secondary | ICD-10-CM | POA: Insufficient documentation

## 2015-04-17 DIAGNOSIS — R079 Chest pain, unspecified: Secondary | ICD-10-CM | POA: Insufficient documentation

## 2015-04-17 DIAGNOSIS — G8929 Other chronic pain: Secondary | ICD-10-CM | POA: Diagnosis not present

## 2015-04-17 DIAGNOSIS — Z79899 Other long term (current) drug therapy: Secondary | ICD-10-CM | POA: Insufficient documentation

## 2015-04-17 DIAGNOSIS — Z8639 Personal history of other endocrine, nutritional and metabolic disease: Secondary | ICD-10-CM | POA: Diagnosis not present

## 2015-04-17 DIAGNOSIS — F419 Anxiety disorder, unspecified: Secondary | ICD-10-CM | POA: Diagnosis not present

## 2015-04-17 DIAGNOSIS — J45909 Unspecified asthma, uncomplicated: Secondary | ICD-10-CM | POA: Insufficient documentation

## 2015-04-17 LAB — CBC
HCT: 39.2 % (ref 36.0–46.0)
HEMOGLOBIN: 12.8 g/dL (ref 12.0–15.0)
MCH: 28.8 pg (ref 26.0–34.0)
MCHC: 32.7 g/dL (ref 30.0–36.0)
MCV: 88.3 fL (ref 78.0–100.0)
Platelets: 272 10*3/uL (ref 150–400)
RBC: 4.44 MIL/uL (ref 3.87–5.11)
RDW: 12.8 % (ref 11.5–15.5)
WBC: 5.5 10*3/uL (ref 4.0–10.5)

## 2015-04-17 LAB — BASIC METABOLIC PANEL
ANION GAP: 6 (ref 5–15)
BUN: 12 mg/dL (ref 6–20)
CALCIUM: 9.1 mg/dL (ref 8.9–10.3)
CO2: 29 mmol/L (ref 22–32)
Chloride: 105 mmol/L (ref 101–111)
Creatinine, Ser: 0.96 mg/dL (ref 0.44–1.00)
GLUCOSE: 105 mg/dL — AB (ref 65–99)
POTASSIUM: 3.8 mmol/L (ref 3.5–5.1)
SODIUM: 140 mmol/L (ref 135–145)

## 2015-04-17 LAB — TROPONIN I

## 2015-04-17 NOTE — ED Provider Notes (Signed)
CSN: [id number]     Arrival date & time 04/17/15  1238 History   First MD Initiated Contact with Patient 04/17/15 1429     Chief Complaint  Patient presents with  . Chest Pain     (Consider location/radiation/quality/duration/timing/severity/associated sxs/prior Treatment) The history is provided by the patient and medical records.    This is a 49 year old female with history of fibromyalgia, anxiety, depression, hyperlipidemia, osteoarthritis, sciatica, presenting to the ED for chest pain. Patient states about a week ago she developed a cold with a nonproductive cough which is since resolved. She states over the past several days she has had some intermittent pain in the left side of her chest with some radiation into her left arm. She denies any shortness breath, diaphoresis, nausea, or vomiting. She states she has felt a "fluttering sensation" in the left side of her chest as well. Patient denies any dizziness or feelings of syncope. Patient had another episode of chest pain early this morning that was more intense than before, however pain resolved prior to arrival in ED. Patient has no known cardiac history. Her father and grandmother have history of heart disease, no prior MI. Patient is a former smoker, quit 9 years ago.  Patient does not take aspirin as she has allergy.  VSS.  Past Medical History  Diagnosis Date  . Fibromyalgia   . Anxiety   . Depression   . Hyperlipidemia   . Asthma   . Degenerative joint disease   . Osteoarthritis   . Belchings   . Lumbar post-laminectomy syndrome   . Lumbosacral neuritis   . Disturbance of skin sensation   . Sciatica   . Chronic pain syndrome   . Other chronic postoperative pain    Past Surgical History  Procedure Laterality Date  . Back surgery  1997, 2003  . Abdominal hysterectomy    . Cystectomy    . Diagnostic laparoscopy     Family History  Problem Relation Age of Onset  . Hypertension Mother   . Hypertension Father   .  Breast cancer Maternal Grandmother   . Liver cancer Maternal Grandmother   . Cancer Maternal Grandmother     breast cancer  . Ovarian cancer Cousin   . Cancer Cousin     1st c. maternal side/ cervical cancer  . Diabetes Maternal Grandfather     both sides  . Cancer Cousin     1st c maternal/ colon cancer   Social History  Substance Use Topics  . Smoking status: Former Smoker -- 1.00 packs/day for 20 years    Types: Cigarettes    Quit date: 07/31/2007  . Smokeless tobacco: Never Used  . Alcohol Use: No   OB History    No data available     Review of Systems  Respiratory: Positive for cough.   Cardiovascular: Positive for chest pain.  All other systems reviewed and are negative.     Allergies  Aspirin; Nsaids; Sulfonamide derivatives; Cymbalta; Gabapentin; Other; and Pregabalin  Home Medications   Prior to Admission medications   Medication Sig Start Date End Date Taking? Authorizing Provider  albuterol (PROAIR HFA) 108 (90 BASE) MCG/ACT inhaler Inhale into the lungs. 02/24/15 02/24/16  Historical Provider, MD  buPROPion (WELLBUTRIN SR) 150 MG 12 hr tablet Take 150 mg by mouth 2 (two) times daily.    Historical Provider, MD  esomeprazole (NEXIUM) 40 MG capsule Take 1 capsule (40 mg total) by mouth 2 (two) times daily. Patient not taking: Reported  on 03/05/2015 09/14/12 12/02/15  Rachael Fee, MD  FLUoxetine (PROZAC) 20 MG tablet Take 20 mg by mouth daily.    Historical Provider, MD  lamoTRIgine (LAMICTAL) 100 MG tablet Take 100 mg by mouth daily. Current does unknown    Historical Provider, MD  morphine (MS CONTIN) 30 MG 12 hr tablet Take 1 tablet (30 mg total) by mouth every 8 (eight) hours. 03/26/15   Jones Bales, NP  Multiple Vitamin (MULITIVITAMIN WITH MINERALS) TABS Take 1 tablet by mouth daily.    Historical Provider, MD  tiZANidine (ZANAFLEX) 4 MG tablet Take 1 tablet (4 mg total) by mouth 2 (two) times daily. 12/02/14   Jones Bales, NP   BP 114/77 mmHg  Pulse  70  Temp(Src) 98.2 F (36.8 C) (Oral)  Resp 16  Ht 5\' 3"  (1.6 m)  Wt 160 lb (72.576 kg)  BMI 28.35 kg/m2  SpO2 99%   Physical Exam  Constitutional: She is oriented to person, place, and time. She appears well-developed and well-nourished. No distress.  HENT:  Head: Normocephalic and atraumatic.  Mouth/Throat: Oropharynx is clear and moist.  Eyes: Conjunctivae and EOM are normal. Pupils are equal, round, and reactive to light.  Neck: Normal range of motion. Neck supple.  Cardiovascular: Normal rate, regular rhythm and normal heart sounds.   Pulmonary/Chest: Effort normal and breath sounds normal. No respiratory distress. She has no wheezes.  Abdominal: Soft. Bowel sounds are normal. There is no tenderness. There is no guarding.  Musculoskeletal: Normal range of motion. She exhibits no edema.  Neurological: She is alert and oriented to person, place, and time.  Skin: Skin is warm and dry. She is not diaphoretic.  Psychiatric: She has a normal mood and affect.  Nursing note and vitals reviewed.   ED Course  Procedures (including critical care time) Labs Review Labs Reviewed  BASIC METABOLIC PANEL - Abnormal; Notable for the following:    Glucose, Bld 105 (*)    All other components within normal limits  CBC  TROPONIN I  TROPONIN I    Imaging Review Dg Chest 2 View  04/17/2015   CLINICAL DATA:  Left-sided chest pain this morning.  EXAM: CHEST  2 VIEW  COMPARISON:  Chest x-ray 09/26/2012 and chest CT 08/15/2013  FINDINGS: The cardiac silhouette, mediastinal and hilar contours are within normal limits and stable. The lungs are clear. No pleural effusion. Nodular densities overlying the upper chest are likely EKG leads. The bony thorax is intact.  IMPRESSION: No acute cardiopulmonary findings.   Electronically Signed   By: Rudie Meyer M.D.   On: 04/17/2015 13:18   I have personally reviewed and evaluated these images and lab results as part of my medical decision-making.   EKG  Interpretation   Date/Time:  Thursday April 17 2015 12:43:48 EDT Ventricular Rate:  78 PR Interval:  132 QRS Duration: 82 QT Interval:  390 QTC Calculation: 444 R Axis:   59 Text Interpretation:  Normal sinus rhythm Nonspecific ST abnormality  Abnormal ECG Confirmed by ZAMMIT  MD, JOSEPH ([id number]) on 04/17/2015 3:18:59  PM      MDM   Final diagnoses:  Chest pain, unspecified chest pain type   49 year old female here with chest pain. Recent URIs well. Patient is afebrile, nontoxic. She denies any chest pain at present. Vital signs stable on room air. EKG sinus rhythm, no acute ischemic changes. Lab work is reassuring, troponin negative. Chest x-ray is clear. Patient does have some concerning factors regarding her  pain without known cardiac RF, however she has never had prior cardiac evaluation.  Will obtain delta troponin.  If negative and patient remains pain free, most likely can discharge home with PCP FU.  Delta troponin negative. Patient remains chest pain-free. Vital signs stable. At this time low suspicion for ACS, PE, dissection, or other acute cardiac event at this time. Patient may follow-up with her PCP. She requests referral to cardiology which was given.  Discussed plan with patient, he/she acknowledged understanding and agreed with plan of care.  Return precautions given for new or worsening symptoms.  Garlon Hatchet, PA-C 04/17/15 1945  Bethann Berkshire, MD 04/18/15 [phone]

## 2015-04-17 NOTE — Discharge Instructions (Signed)
Your workup today was normal. You may call and schedule a follow-up with cardiology if desired-- contact info provided. Otherwise you may follow-up with her primary care physician. Return here for any new or worsening symptoms.

## 2015-04-17 NOTE — ED Notes (Signed)
Patient states mid to L chest pain that radiates into L arm.   Patient denies other symptoms today.  Patient states chest pain has resolved itself at this time.

## 2015-04-21 NOTE — Progress Notes (Signed)
Urine drug screen for this encounter is consistent for prescribed medication 

## 2015-04-25 ENCOUNTER — Encounter: Payer: Medicare Other | Attending: Registered Nurse | Admitting: Registered Nurse

## 2015-04-25 ENCOUNTER — Encounter: Payer: Self-pay | Admitting: Registered Nurse

## 2015-04-25 VITALS — BP 123/78 | HR 72

## 2015-04-25 DIAGNOSIS — M797 Fibromyalgia: Secondary | ICD-10-CM | POA: Diagnosis not present

## 2015-04-25 DIAGNOSIS — G8929 Other chronic pain: Secondary | ICD-10-CM | POA: Diagnosis present

## 2015-04-25 DIAGNOSIS — F329 Major depressive disorder, single episode, unspecified: Secondary | ICD-10-CM | POA: Insufficient documentation

## 2015-04-25 DIAGNOSIS — Z981 Arthrodesis status: Secondary | ICD-10-CM | POA: Insufficient documentation

## 2015-04-25 DIAGNOSIS — M1990 Unspecified osteoarthritis, unspecified site: Secondary | ICD-10-CM | POA: Insufficient documentation

## 2015-04-25 DIAGNOSIS — E785 Hyperlipidemia, unspecified: Secondary | ICD-10-CM | POA: Diagnosis not present

## 2015-04-25 DIAGNOSIS — Z87891 Personal history of nicotine dependence: Secondary | ICD-10-CM | POA: Diagnosis not present

## 2015-04-25 DIAGNOSIS — G894 Chronic pain syndrome: Secondary | ICD-10-CM | POA: Diagnosis not present

## 2015-04-25 DIAGNOSIS — Z5181 Encounter for therapeutic drug level monitoring: Secondary | ICD-10-CM | POA: Diagnosis not present

## 2015-04-25 DIAGNOSIS — Z79899 Other long term (current) drug therapy: Secondary | ICD-10-CM

## 2015-04-25 DIAGNOSIS — G629 Polyneuropathy, unspecified: Secondary | ICD-10-CM | POA: Diagnosis not present

## 2015-04-25 DIAGNOSIS — J45909 Unspecified asthma, uncomplicated: Secondary | ICD-10-CM | POA: Insufficient documentation

## 2015-04-25 DIAGNOSIS — M961 Postlaminectomy syndrome, not elsewhere classified: Secondary | ICD-10-CM | POA: Diagnosis not present

## 2015-04-25 MED ORDER — MORPHINE SULFATE ER 30 MG PO TBCR: 30.0000 mg | EXTENDED_RELEASE_TABLET | Freq: Three times a day (TID) | ORAL | Status: DC

## 2015-04-25 NOTE — Progress Notes (Signed)
Subjective:    Patient ID: Cindy Pratt, female    DOB: 1966-02-25, 49 y.o.   MRN: [id number]  HPI: [name] is a 49 year old female who returns for follow up for chronic pain and medication refill. She's complaining of generalized pain, fibro pain and headaches. She rates her pain 6. Her current exercise regime is walking. Also went to [facility] ED on 04/17/15 for chest pain she has a follow up appointment with cardiology.  Pain Inventory Average Pain 9 Pain Right Now 6 My pain is constant and dull  In the last 24 hours, has pain interfered with the following? General activity 8 Relation with others 8 Enjoyment of life 8 What TIME of day is your pain at its worst? na Sleep (in general) Fair  Pain is worse with: na Pain improves with: medication Relief from Meds: 9  Mobility Do you have any goals in this area?  no  Function Do you have any goals in this area?  no  Neuro/Psych weakness tingling dizziness confusion depression anxiety  Prior Studies Any changes since last visit?  yes x-rays  Physicians involved in your care Any changes since last visit?  yes   Family History  Problem Relation Age of Onset  . Hypertension Mother   . Hypertension Father   . Breast cancer Maternal Grandmother   . Liver cancer Maternal Grandmother   . Cancer Maternal Grandmother     breast cancer  . Ovarian cancer Cousin   . Cancer Cousin     1st c. maternal side/ cervical cancer  . Diabetes Maternal Grandfather     both sides  . Cancer Cousin     1st c maternal/ colon cancer   Social History   Social History  . Marital Status: Married    Spouse Name: [name]  . Number of Children: 4  . Years of Education: M   Occupational History  . disabled    Social History Main Topics  . Smoking status: Former Smoker -- 1.00 packs/day for 20 years    Types: Cigarettes    Quit date: 07/31/2007  . Smokeless tobacco: Never Used  . Alcohol Use: No  . Drug Use: No  .  Sexual Activity: Yes    Birth Control/ Protection: Surgical   Other Topics Concern  . None   Social History Narrative   Lives at home with husband [name]   Drinks 4 sodas a day   Past Surgical History  Procedure Laterality Date  . Back surgery  1997, 2003  . Abdominal hysterectomy    . Cystectomy    . Diagnostic laparoscopy     Past Medical History  Diagnosis Date  . Fibromyalgia   . Anxiety   . Depression   . Hyperlipidemia   . Asthma   . Degenerative joint disease   . Osteoarthritis   . Belchings   . Lumbar post-laminectomy syndrome   . Lumbosacral neuritis   . Disturbance of skin sensation   . Sciatica   . Chronic pain syndrome   . Other chronic postoperative pain    BP 123/78 mmHg  Pulse 72  SpO2 94%  Opioid Risk Score:   Fall Risk Score:  `1  Depression screen PHQ 2/9  Depression screen PHQ 2/9 12/02/2014  Decreased Interest 1  Down, Depressed, Hopeless 1  PHQ - 2 Score 2  Altered sleeping 1  Tired, decreased energy 1  Change in appetite 0  Feeling bad or failure about  yourself  0  Trouble concentrating 1  Moving slowly or fidgety/restless 0  Suicidal thoughts 0  PHQ-9 Score 5     Review of Systems  Constitutional:       Low blood sugar  Respiratory: Positive for cough, shortness of breath and wheezing.   All other systems reviewed and are negative.      Objective:   Physical Exam  Constitutional: She is oriented to person, place, and time. She appears well-developed and well-nourished.  HENT:  Head: Normocephalic and atraumatic.  Neck: Normal range of motion. Neck supple.  Cervical Paraspinal Tenderness: C-5- C-6  Cardiovascular: Normal rate and regular rhythm.   Pulmonary/Chest: Effort normal and breath sounds normal.  Musculoskeletal:  Normal Muscle Bulk and Muscle Testing Reveals: Upper Extremities: Full ROM and Muscle Strength 5/5 Thoracic Paraspinal Tenderness: T-1- T-2 Lower Extremities: Full ROM and Muscle Strength 5/5  Arises  from chair with ease Narrow Based gait   Neurological: She is alert and oriented to person, place, and time.  Skin: Skin is warm and dry.  Psychiatric: She has a normal mood and affect.  Nursing note and vitals reviewed.         Assessment & Plan:  1.Lumbar postlaminectomy syndrome status post L5-S1 fusion radiating to ZOX:WRUEAVWU: MS contin 30 mg # 90 pills--use one pill every 8 hours for pain  2. Depression: Continue Current Medication Regimen of Prozac and Wellbutrin.   15 minutes of face to face patient care time was spent during this visit. All questions were encouraged and answered.

## 2015-05-19 ENCOUNTER — Ambulatory Visit (INDEPENDENT_AMBULATORY_CARE_PROVIDER_SITE_OTHER): Payer: Medicare Other | Admitting: Cardiology

## 2015-05-19 ENCOUNTER — Encounter: Payer: Self-pay | Admitting: Cardiology

## 2015-05-19 VITALS — BP 112/78 | HR 69 | Ht 63.5 in | Wt 165.1 lb

## 2015-05-19 DIAGNOSIS — R079 Chest pain, unspecified: Secondary | ICD-10-CM

## 2015-05-19 DIAGNOSIS — M797 Fibromyalgia: Secondary | ICD-10-CM | POA: Diagnosis not present

## 2015-05-19 DIAGNOSIS — Z87891 Personal history of nicotine dependence: Secondary | ICD-10-CM

## 2015-05-19 NOTE — Progress Notes (Signed)
[address] [place], [place]  [address] Phone: [phone] Fax:  [phone]  Date:  05/19/2015   ID:  Cindy Pratt, DOB 04/14/1966, MRN [id number]  PCP:  Tomi Bamberger, NP   History of Present Illness: Cindy Pratt is a 49 y.o. female here for evaluation of chest pain. She was seen in the emergency department on 04/17/15 with chest discomfort. Approximately week before her arrival she was having a nonproductive cough with a cold and describing intermittent pain in the left side of her chest with perhaps radiation to her left arm. She also felt palpitations described as a fluttering sensation in the left side of her chest. No syncope. On the morning of visit to the emergency room, her pain became more intense. Her father as well as grandmother have a history of heart disease.  She has been expressing some dyspnea  She quit smoking over 9 years ago. No aspirin secondary to allergy. She also has past history of fibromyalgia, anxiety, depression, hyperlipidemia.  She does not take aspirin or other NSAIDs because they "lock up her lungs ".   Wt Readings from Last 3 Encounters:  05/19/15 165 lb 1.9 oz (74.898 kg)  04/17/15 160 lb (72.576 kg)  03/05/15 165 lb 3.2 oz (74.934 kg)     Past Medical History  Diagnosis Date  . Fibromyalgia   . Anxiety   . Depression   . Hyperlipidemia   . Asthma   . Degenerative joint disease   . Osteoarthritis   . Belchings   . Lumbar post-laminectomy syndrome   . Lumbosacral neuritis   . Disturbance of skin sensation   . Sciatica   . Chronic pain syndrome   . Other chronic postoperative pain     Past Surgical History  Procedure Laterality Date  . Back surgery  1997, 2003  . Abdominal hysterectomy    . Cystectomy    . Diagnostic laparoscopy      Current Outpatient Prescriptions  Medication Sig Dispense Refill  . albuterol (PROAIR HFA) 108 (90 BASE) MCG/ACT inhaler Inhale 1-2 puffs into the lungs every 4 (four)  hours as needed for wheezing or shortness of breath.     Marland Kitchen buPROPion (WELLBUTRIN SR) 150 MG 12 hr tablet Take 150 mg by mouth 2 (two) times daily.    Marland Kitchen esomeprazole (NEXIUM) 40 MG capsule Take 1 capsule (40 mg total) by mouth 2 (two) times daily. 60 capsule 11  . FLUoxetine (PROZAC) 20 MG capsule Take 60 mg by mouth daily.  5  . lamoTRIgine (LAMICTAL) 150 MG tablet Take 150 mg by mouth daily.    Marland Kitchen morphine (MS CONTIN) 30 MG 12 hr tablet Take 1 tablet (30 mg total) by mouth every 8 (eight) hours. 90 tablet 0  . tiZANidine (ZANAFLEX) 4 MG tablet Take 1 tablet (4 mg total) by mouth 2 (two) times daily. 60 tablet 5   No current facility-administered medications for this visit.    Allergies:    Allergies  Allergen Reactions  . Aspirin Shortness Of Breath  . Nsaids Shortness Of Breath  . Sulfonamide Derivatives Hives  . Cymbalta [Duloxetine Hcl] Other (See Comments)    confusion  . Gabapentin Other (See Comments)    Causes confusion  . Other     [place]hda? Patient is not sure of spelling it is an herb  . Pregabalin     REACTION: confusion    Social History:  The patient  reports that she  quit smoking about 7 years ago. Her smoking use included Cigarettes. She has a 20 pack-year smoking history. She has never used smokeless tobacco. She reports that she does not drink alcohol or use illicit drugs.   Family History  Problem Relation Age of Onset  . Hypertension Mother   . Hypertension Father   . Breast cancer Maternal Grandmother   . Liver cancer Maternal Grandmother   . Cancer Maternal Grandmother     breast cancer  . Ovarian cancer Cousin   . Cancer Cousin     1st c. maternal side/ cervical cancer  . Diabetes Maternal Grandfather     both sides  . Cancer Cousin     1st c maternal/ colon cancer    ROS:  Please see the history of present illness.   No syncope, no bleeding.   All other systems reviewed and negative.   PHYSICAL EXAM: VS:  BP 112/78 mmHg  Pulse 69  Ht 5' 3.5"  (1.613 m)  Wt 165 lb 1.9 oz (74.898 kg)  BMI 28.79 kg/m2  SpO2 97% Well nourished, well developed, in no acute distress HEENT: normal, [place]/AT, EOMI Neck: no JVD, normal carotid upstroke, no bruit Cardiac:  normal S1, S2; RRR; no murmur Lungs:  clear to auscultation bilaterally, no wheezing, rhonchi or rales Abd: soft, nontender, no hepatomegaly, no bruits Ext: no edema, 2+ distal pulses Skin: warm and dry GU: deferred Neuro: no focal abnormalities noted, AAO x 3  EKG:  Today 04/17/15-sinus rhythm, nonspecific ST-T wave changes personally viewed  ASSESSMENT AND PLAN:  1. Atypical chest discomfort-we will go ahead and check a nuclear stress test, pharmacologic. She is unable to exercise because of her disability. It is possible that her pain may be musculoskeletal in origin. Perhaps even fluttering sensation could be fasciculations of the intercostal muscles between the ribs perhaps. No high-risk symptoms such as syncope. EKG did not show any evidence of dangerous arrhythmias. 2. Former smoker-quit several years ago. Excellent. 3. Fibromyalgia-may be playing a role in her pain syndrome. Nonetheless, I would like to exclude the possibility of high-risk coronary artery disease. 4. We will follow-up with results of stress test.  Signed, Donato Schultz, MD [facility]  05/19/2015 3:43 PM

## 2015-05-19 NOTE — Patient Instructions (Signed)
Medication Instructions:  The current medical regimen is effective;  continue present plan and medications.  Testing/Procedures: Your physician has requested that you have a lexiscan myoview. For further information please visit www.cardiosmart.org. Please follow instruction sheet, as given.  Follow-Up: Follow up as needed.  Thank you for choosing [facility]!!       

## 2015-05-21 ENCOUNTER — Telehealth (HOSPITAL_COMMUNITY): Payer: Self-pay | Admitting: *Deleted

## 2015-05-21 NOTE — Telephone Encounter (Signed)
Left message on voicemail per DPR in reference to upcoming appointment scheduled on 05/23/15 at 915 with detailed instructions given per Myocardial Perfusion Study Information Sheet for the test. LM to arrive 15 minutes early, and that it is imperative to arrive on time for appointment to keep from having the test rescheduled. If you need to cancel or reschedule your appointment, please call the office within 24 hours of your appointment. Failure to do so may result in a cancellation of your appointment, and a $50 no show fee. Phone number given for call back for any questions. Antionette Char, RN

## 2015-05-22 ENCOUNTER — Encounter: Payer: Self-pay | Admitting: Registered Nurse

## 2015-05-22 ENCOUNTER — Encounter: Payer: Medicare Other | Attending: Registered Nurse | Admitting: Registered Nurse

## 2015-05-22 VITALS — BP 111/67 | HR 78

## 2015-05-22 DIAGNOSIS — J45909 Unspecified asthma, uncomplicated: Secondary | ICD-10-CM | POA: Diagnosis not present

## 2015-05-22 DIAGNOSIS — G894 Chronic pain syndrome: Secondary | ICD-10-CM

## 2015-05-22 DIAGNOSIS — F329 Major depressive disorder, single episode, unspecified: Secondary | ICD-10-CM | POA: Diagnosis not present

## 2015-05-22 DIAGNOSIS — M961 Postlaminectomy syndrome, not elsewhere classified: Secondary | ICD-10-CM | POA: Diagnosis not present

## 2015-05-22 DIAGNOSIS — Z981 Arthrodesis status: Secondary | ICD-10-CM | POA: Insufficient documentation

## 2015-05-22 DIAGNOSIS — G8929 Other chronic pain: Secondary | ICD-10-CM | POA: Diagnosis present

## 2015-05-22 DIAGNOSIS — Z87891 Personal history of nicotine dependence: Secondary | ICD-10-CM | POA: Diagnosis not present

## 2015-05-22 DIAGNOSIS — Z5181 Encounter for therapeutic drug level monitoring: Secondary | ICD-10-CM

## 2015-05-22 DIAGNOSIS — E785 Hyperlipidemia, unspecified: Secondary | ICD-10-CM | POA: Diagnosis not present

## 2015-05-22 DIAGNOSIS — Z79899 Other long term (current) drug therapy: Secondary | ICD-10-CM

## 2015-05-22 DIAGNOSIS — G629 Polyneuropathy, unspecified: Secondary | ICD-10-CM | POA: Diagnosis not present

## 2015-05-22 DIAGNOSIS — M1990 Unspecified osteoarthritis, unspecified site: Secondary | ICD-10-CM | POA: Diagnosis not present

## 2015-05-22 DIAGNOSIS — M797 Fibromyalgia: Secondary | ICD-10-CM | POA: Diagnosis not present

## 2015-05-22 MED ORDER — TIZANIDINE HCL 4 MG PO TABS: 4.0000 mg | ORAL_TABLET | Freq: Two times a day (BID) | ORAL | Status: DC

## 2015-05-22 MED ORDER — MORPHINE SULFATE ER 30 MG PO TBCR: 30.0000 mg | EXTENDED_RELEASE_TABLET | Freq: Three times a day (TID) | ORAL | Status: DC

## 2015-05-22 NOTE — Progress Notes (Signed)
Subjective:    Patient ID: Cindy Pratt, female    DOB: 1966/07/05, 49 y.o.   MRN: [id number]  HPI: [name] is a 49 year old female who returns for follow up for chronic pain and medication refill. She's complaining of generalized pain, fibro pain. She rates her pain 8. Her current exercise regime is walking in the home.Cindy Pratt has followed up with the cardiologist regarding her chest pain she's scheduled for a  stress test next week. Daughter in room.  Pain Inventory Average Pain 9 Pain Right Now 8 My pain is intermittent, constant, sharp, burning, dull, stabbing, tingling and aching  In the last 24 hours, has pain interfered with the following? General activity 9 Relation with others 9 Enjoyment of life 9 What TIME of day is your pain at its worst? all Sleep (in general) NA  Pain is worse with: some activites Pain improves with: medication Relief from Meds: no answer  Mobility walk without assistance  Function Do you have any goals in this area?  no  Neuro/Psych weakness numbness tingling dizziness confusion depression anxiety  Prior Studies Any changes since last visit?  no  Physicians involved in your care Any changes since last visit?  no   Family History  Problem Relation Age of Onset  . Hypertension Mother   . Hypertension Father   . Breast cancer Maternal Grandmother   . Liver cancer Maternal Grandmother   . Cancer Maternal Grandmother     breast cancer  . Ovarian cancer Cousin   . Cancer Cousin     1st c. maternal side/ cervical cancer  . Diabetes Maternal Grandfather     both sides  . Cancer Cousin     1st c maternal/ colon cancer   Social History   Social History  . Marital Status: Married    Spouse Name: [name]  . Number of Children: 4  . Years of Education: M   Occupational History  . disabled    Social History Main Topics  . Smoking status: Former Smoker -- 1.00 packs/day for 20 years    Types: Cigarettes   Quit date: 07/31/2007  . Smokeless tobacco: Never Used  . Alcohol Use: No  . Drug Use: No  . Sexual Activity: Yes    Birth Control/ Protection: Surgical   Other Topics Concern  . None   Social History Narrative   Lives at home with husband [name]   Drinks 4 sodas a day   Past Surgical History  Procedure Laterality Date  . Back surgery  1997, 2003  . Abdominal hysterectomy    . Cystectomy    . Diagnostic laparoscopy     Past Medical History  Diagnosis Date  . Fibromyalgia   . Anxiety   . Depression   . Hyperlipidemia   . Asthma   . Degenerative joint disease   . Osteoarthritis   . Belchings   . Lumbar post-laminectomy syndrome   . Lumbosacral neuritis   . Disturbance of skin sensation   . Sciatica   . Chronic pain syndrome   . Other chronic postoperative pain    BP 111/67 mmHg  Pulse 78  SpO2 96%  Opioid Risk Score:   Fall Risk Score:  `1  Depression screen PHQ 2/9  Depression screen [facility] 2/9 05/22/2015 12/02/2014  Decreased Interest 1 1  Down, Depressed, Hopeless 1 1  PHQ - 2 Score 2 2  Altered sleeping - 1  Tired, decreased energy - 1  Change in appetite - 0  Feeling bad or failure about yourself  - 0  Trouble concentrating - 1  Moving slowly or fidgety/restless - 0  Suicidal thoughts - 0  PHQ-9 Score - 5     Review of Systems  Neurological: Positive for dizziness, weakness and numbness.       Tingling  Psychiatric/Behavioral: Positive for confusion and dysphoric mood. The patient is nervous/anxious.   All other systems reviewed and are negative.      Objective:   Physical Exam  Constitutional: She is oriented to person, place, and time. She appears well-developed and well-nourished.  HENT:  Head: Normocephalic and atraumatic.  Neck: Normal range of motion. Neck supple.  Cardiovascular: Normal rate and regular rhythm.   Pulmonary/Chest: Effort normal and breath sounds normal.  Musculoskeletal:  Normal Muscle Bulk and Muscle Testing  Reveals: Upper Extremities: Full ROM and Muscle Strength 5/5 Thoracic Paraspinal Tenderness: T-1- T-5 Lumbar Paraspinal Tenderness: L-3- L-4 Lower Extremities: Full ROM and Muscle Strength 5/5 Arises from chair with ease Narrow Based Gait  Neurological: She is alert and oriented to person, place, and time.  Skin: Skin is warm and dry.  Psychiatric: She has a normal mood and affect.  Nursing note and vitals reviewed.         Assessment & Plan:  1.Lumbar postlaminectomy syndrome status post L5-S1 fusion radiating to [id number]: MS contin 30 mg # 90 pills--use one pill every 8 hours for pain  2. Depression: Continue Current Medication Regimen of Prozac and Wellbutrin.   15 minutes of face to face patient care time was spent during this visit. All questions were encouraged and answered.

## 2015-05-23 ENCOUNTER — Encounter: Payer: Medicare Other | Admitting: Registered Nurse

## 2015-05-23 ENCOUNTER — Ambulatory Visit (HOSPITAL_COMMUNITY): Payer: Medicare Other | Attending: Cardiology

## 2015-05-23 DIAGNOSIS — R079 Chest pain, unspecified: Secondary | ICD-10-CM | POA: Diagnosis not present

## 2015-05-23 DIAGNOSIS — R002 Palpitations: Secondary | ICD-10-CM | POA: Insufficient documentation

## 2015-05-23 DIAGNOSIS — E119 Type 2 diabetes mellitus without complications: Secondary | ICD-10-CM | POA: Diagnosis not present

## 2015-05-23 LAB — MYOCARDIAL PERFUSION IMAGING
CHL CUP NUCLEAR SSS: 1
CHL CUP RESTING HR STRESS: 66 {beats}/min
CSEPPHR: 98 {beats}/min
LV dias vol: 85 mL
LVSYSVOL: 27 mL
NUC STRESS TID: 1.09
RATE: 0.36
SDS: 0
SRS: 1

## 2015-05-23 MED ORDER — REGADENOSON 0.4 MG/5ML IV SOLN
0.4000 mg | Freq: Once | INTRAVENOUS | Status: AC
Start: 2015-05-23 — End: 2015-05-23
  Administered 2015-05-23: 0.4 mg via INTRAVENOUS

## 2015-05-23 MED ORDER — TECHNETIUM TC 99M SESTAMIBI GENERIC - CARDIOLITE
32.4000 | Freq: Once | INTRAVENOUS | Status: AC | PRN
  Administered 2015-05-23: 32 via INTRAVENOUS

## 2015-05-23 MED ORDER — TECHNETIUM TC 99M SESTAMIBI GENERIC - CARDIOLITE
9.6000 | Freq: Once | INTRAVENOUS | Status: AC | PRN
  Administered 2015-05-23: 10 via INTRAVENOUS

## 2015-06-19 ENCOUNTER — Encounter: Payer: Medicare Other | Admitting: Registered Nurse

## 2015-06-25 ENCOUNTER — Encounter: Payer: Self-pay | Admitting: Registered Nurse

## 2015-06-25 ENCOUNTER — Encounter: Payer: Medicare Other | Attending: Registered Nurse | Admitting: Registered Nurse

## 2015-06-25 VITALS — BP 106/73 | HR 74 | Resp 14

## 2015-06-25 DIAGNOSIS — M1990 Unspecified osteoarthritis, unspecified site: Secondary | ICD-10-CM | POA: Diagnosis not present

## 2015-06-25 DIAGNOSIS — Z5181 Encounter for therapeutic drug level monitoring: Secondary | ICD-10-CM

## 2015-06-25 DIAGNOSIS — G8929 Other chronic pain: Secondary | ICD-10-CM | POA: Diagnosis present

## 2015-06-25 DIAGNOSIS — Z87891 Personal history of nicotine dependence: Secondary | ICD-10-CM | POA: Insufficient documentation

## 2015-06-25 DIAGNOSIS — Z981 Arthrodesis status: Secondary | ICD-10-CM | POA: Diagnosis not present

## 2015-06-25 DIAGNOSIS — J45909 Unspecified asthma, uncomplicated: Secondary | ICD-10-CM | POA: Insufficient documentation

## 2015-06-25 DIAGNOSIS — G629 Polyneuropathy, unspecified: Secondary | ICD-10-CM | POA: Insufficient documentation

## 2015-06-25 DIAGNOSIS — M797 Fibromyalgia: Secondary | ICD-10-CM | POA: Diagnosis not present

## 2015-06-25 DIAGNOSIS — G894 Chronic pain syndrome: Secondary | ICD-10-CM

## 2015-06-25 DIAGNOSIS — F329 Major depressive disorder, single episode, unspecified: Secondary | ICD-10-CM | POA: Insufficient documentation

## 2015-06-25 DIAGNOSIS — E785 Hyperlipidemia, unspecified: Secondary | ICD-10-CM | POA: Diagnosis not present

## 2015-06-25 DIAGNOSIS — M961 Postlaminectomy syndrome, not elsewhere classified: Secondary | ICD-10-CM | POA: Insufficient documentation

## 2015-06-25 DIAGNOSIS — Z79899 Other long term (current) drug therapy: Secondary | ICD-10-CM

## 2015-06-25 MED ORDER — MORPHINE SULFATE ER 30 MG PO TBCR: 30.0000 mg | EXTENDED_RELEASE_TABLET | Freq: Three times a day (TID) | ORAL | Status: DC

## 2015-06-25 NOTE — Progress Notes (Signed)
Subjective:    Patient ID: Cindy Pratt, female    DOB: 12-Mar-1966, 49 y.o.   MRN: [id number]  HPI: Cindy Pratt is a 49 year old female who returns for follow up for chronic pain and medication refill. She says her pain is located in her neck and lower back. Also complaining of generalized pain all over. She rates her pain 7. Her current exercise regime is walking in the home.  Husband in room.   Pain Inventory Average Pain 9 Pain Right Now 7 My pain is intermittent, constant, sharp, burning, dull, stabbing, tingling and aching  In the last 24 hours, has pain interfered with the following? General activity 9 Relation with others 9 Enjoyment of life 9 What TIME of day is your pain at its worst? All the time Sleep (in general) Fair  Pain is worse with: NA Pain improves with: Alternating Positions Relief from Meds: NA  Mobility Do you have any goals in this area?  no  Function Do you have any goals in this area?  no  Neuro/Psych weakness tingling dizziness confusion depression anxiety  Prior Studies Any changes since last visit?  no  Physicians involved in your care Any changes since last visit?  no   Family History  Problem Relation Age of Onset  . Hypertension Mother   . Hypertension Father   . Breast cancer Maternal Grandmother   . Liver cancer Maternal Grandmother   . Cancer Maternal Grandmother     breast cancer  . Ovarian cancer Cousin   . Cancer Cousin     1st c. maternal side/ cervical cancer  . Diabetes Maternal Grandfather     both sides  . Cancer Cousin     1st c maternal/ colon cancer   Social History   Social History  . Marital Status: Married    Spouse Name: Cindy Pratt  . Number of Children: 4  . Years of Education: M   Occupational History  . disabled    Social History Main Topics  . Smoking status: Former Smoker -- 1.00 packs/day for 20 years    Types: Cigarettes    Quit date: 07/31/2007  . Smokeless tobacco: Never Used    . Alcohol Use: No  . Drug Use: No  . Sexual Activity: Yes    Birth Control/ Protection: Surgical   Other Topics Concern  . None   Social History Narrative   Lives at home with husband Cindy Pratt   Drinks 4 sodas a day   Past Surgical History  Procedure Laterality Date  . Back surgery  1997, 2003  . Abdominal hysterectomy    . Cystectomy    . Diagnostic laparoscopy     Past Medical History  Diagnosis Date  . Fibromyalgia   . Anxiety   . Depression   . Hyperlipidemia   . Asthma   . Degenerative joint disease   . Osteoarthritis   . Belchings   . Lumbar post-laminectomy syndrome   . Lumbosacral neuritis   . Disturbance of skin sensation   . Sciatica   . Chronic pain syndrome   . Other chronic postoperative pain    BP 106/73 mmHg  Pulse 74  Resp 14  SpO2 94%  Opioid Risk Score:   Fall Risk Score:  `1  Depression screen PHQ 2/9  [facility] 05/22/2015 12/02/2014  Decreased Interest 1 1  Down, Depressed, Hopeless 1 1  PHQ - 2 Score 2 2  Altered sleeping - 1  Tired, decreased energy - 1  Change in appetite - 0  Feeling bad or failure about yourself  - 0  Trouble concentrating - 1  Moving slowly or fidgety/restless - 0  Suicidal thoughts - 0  PHQ-9 Score - 5     Review of Systems  Constitutional: Positive for unexpected weight change.  Neurological: Positive for dizziness and weakness.       Tingling  Psychiatric/Behavioral: Positive for confusion. The patient is nervous/anxious.        Depression  All other systems reviewed and are negative.      Objective:   Physical Exam  Constitutional: She is oriented to person, place, and time. She appears well-developed and well-nourished.  HENT:  Head: Normocephalic and atraumatic.  Neck: Normal range of motion. Neck supple.  Cervical Paraspinal Tenderness: C-5- C-6  Cardiovascular: Normal rate and regular rhythm.   Pulmonary/Chest: Effort normal and breath sounds normal.  Musculoskeletal:   Normal Muscle Bulk and Muscle Testing Reveals: Upper Extremities: Full ROM and Muscle Strength 5/5 Thoracic Paraspinal Tenderness: T-10- T-12 Lumbar Paraspinal Tenderness: L-4- L-5 Lower Extremities: Full ROM and Muscle Strength 5/5 Arises From chair with ease Narrow Based gait  Neurological: She is alert and oriented to person, place, and time.  Skin: Skin is warm and dry.  Psychiatric: She has a normal mood and affect.  Nursing note and vitals reviewed.         Assessment & Plan:  1.Lumbar postlaminectomy syndrome status post L5-S1 fusion radiating to [id number]: MS contin 30 mg # 90 pills--use one pill every 8 hours for pain  2. Depression: Continue Current Medication Regimen of Prozac and Wellbutrin.   15 minutes of face to face patient care time was spent during this visit. All questions were encouraged and answered

## 2015-07-21 ENCOUNTER — Encounter: Payer: Medicare Other | Attending: Registered Nurse | Admitting: Registered Nurse

## 2015-07-21 ENCOUNTER — Ambulatory Visit
Admission: RE | Admit: 2015-07-21 | Discharge: 2015-07-21 | Disposition: A | Discharge status: Home / Self Care | Payer: Medicare Other | Source: Ambulatory Visit

## 2015-07-21 ENCOUNTER — Encounter: Payer: Self-pay | Admitting: Registered Nurse

## 2015-07-21 ENCOUNTER — Other Ambulatory Visit: Payer: Self-pay

## 2015-07-21 VITALS — BP 123/71 | HR 74

## 2015-07-21 DIAGNOSIS — G894 Chronic pain syndrome: Secondary | ICD-10-CM | POA: Diagnosis not present

## 2015-07-21 DIAGNOSIS — E785 Hyperlipidemia, unspecified: Secondary | ICD-10-CM | POA: Diagnosis not present

## 2015-07-21 DIAGNOSIS — G629 Polyneuropathy, unspecified: Secondary | ICD-10-CM | POA: Diagnosis not present

## 2015-07-21 DIAGNOSIS — F329 Major depressive disorder, single episode, unspecified: Secondary | ICD-10-CM | POA: Insufficient documentation

## 2015-07-21 DIAGNOSIS — M961 Postlaminectomy syndrome, not elsewhere classified: Secondary | ICD-10-CM | POA: Diagnosis not present

## 2015-07-21 DIAGNOSIS — G8929 Other chronic pain: Secondary | ICD-10-CM | POA: Diagnosis present

## 2015-07-21 DIAGNOSIS — Z87891 Personal history of nicotine dependence: Secondary | ICD-10-CM | POA: Diagnosis not present

## 2015-07-21 DIAGNOSIS — J45909 Unspecified asthma, uncomplicated: Secondary | ICD-10-CM | POA: Insufficient documentation

## 2015-07-21 DIAGNOSIS — M797 Fibromyalgia: Secondary | ICD-10-CM | POA: Diagnosis not present

## 2015-07-21 DIAGNOSIS — Z981 Arthrodesis status: Secondary | ICD-10-CM | POA: Insufficient documentation

## 2015-07-21 DIAGNOSIS — M1990 Unspecified osteoarthritis, unspecified site: Secondary | ICD-10-CM | POA: Insufficient documentation

## 2015-07-21 DIAGNOSIS — M5412 Radiculopathy, cervical region: Secondary | ICD-10-CM | POA: Diagnosis not present

## 2015-07-21 DIAGNOSIS — Z5181 Encounter for therapeutic drug level monitoring: Secondary | ICD-10-CM

## 2015-07-21 DIAGNOSIS — Z79899 Other long term (current) drug therapy: Secondary | ICD-10-CM

## 2015-07-21 DIAGNOSIS — Z1231 Encounter for screening mammogram for malignant neoplasm of breast: Secondary | ICD-10-CM

## 2015-07-21 MED ORDER — MORPHINE SULFATE ER 30 MG PO TBCR: 30.0000 mg | EXTENDED_RELEASE_TABLET | Freq: Three times a day (TID) | ORAL | Status: DC

## 2015-07-21 NOTE — Progress Notes (Signed)
Subjective:    Patient ID: Cindy Pratt, female    DOB: 1965-11-20, 49 y.o.   MRN: [id number]  HPI: [name] is a 49 year old female who returns for follow up for chronic pain and medication refill. She says her pain is located in her neck radiating into her left arm with numbness and lower back.  She rates her pain 9. Her current exercise regime is walking in the home.  Asked about  Cervical MRI she states she spoke to Dr. Wynn Banker regarding this a few months ago.  She's still undergoing a cardiac workup once release from cardiology will speak to Dr. Wynn Banker regarding the MRI, she verbalizes understanding.  Pain Inventory Average Pain 8 Pain Right Now 9 My pain is NA  In the last 24 hours, has pain interfered with the following? General activity 9 Relation with others 9 Enjoyment of life 9 What TIME of day is your pain at its worst? All the time Sleep (in general) Poor  Pain is worse with: walking, bending, sitting, inactivity, standing and some activites Pain improves with: NA Relief from Meds: 9  Mobility Do you have any goals in this area?  no  Function Do you have any goals in this area?  no  Neuro/Psych weakness numbness dizziness confusion depression anxiety  Prior Studies Any changes since last visit?  no  Physicians involved in your care Any changes since last visit?  no   Family History  Problem Relation Age of Onset  . Hypertension Mother   . Hypertension Father   . Breast cancer Maternal Grandmother   . Liver cancer Maternal Grandmother   . Cancer Maternal Grandmother     breast cancer  . Ovarian cancer Cousin   . Cancer Cousin     1st c. maternal side/ cervical cancer  . Diabetes Maternal Grandfather     both sides  . Cancer Cousin     1st c maternal/ colon cancer   Social History   Social History  . Marital Status: Married    Spouse Name: [name]  . Number of Children: 4  . Years of Education: M   Occupational History    . disabled    Social History Main Topics  . Smoking status: Former Smoker -- 1.00 packs/day for 20 years    Types: Cigarettes    Quit date: 07/31/2007  . Smokeless tobacco: Never Used  . Alcohol Use: No  . Drug Use: No  . Sexual Activity: Yes    Birth Control/ Protection: Surgical   Other Topics Concern  . None   Social History Narrative   Lives at home with husband [name]   Drinks 4 sodas a day   Past Surgical History  Procedure Laterality Date  . Back surgery  1997, 2003  . Abdominal hysterectomy    . Cystectomy    . Diagnostic laparoscopy     Past Medical History  Diagnosis Date  . Fibromyalgia   . Anxiety   . Depression   . Hyperlipidemia   . Asthma   . Degenerative joint disease   . Osteoarthritis   . Belchings   . Lumbar post-laminectomy syndrome   . Lumbosacral neuritis   . Disturbance of skin sensation   . Sciatica   . Chronic pain syndrome   . Other chronic postoperative pain    BP 123/71 mmHg  Pulse 74  SpO2 95%  Opioid Risk Score:   Fall Risk Score:  `1  Depression screen  PHQ 2/9  Depression screen [facility] 2/9 05/22/2015 12/02/2014  Decreased Interest 1 1  Down, Depressed, Hopeless 1 1  PHQ - 2 Score 2 2  Altered sleeping - 1  Tired, decreased energy - 1  Change in appetite - 0  Feeling bad or failure about yourself  - 0  Trouble concentrating - 1  Moving slowly or fidgety/restless - 0  Suicidal thoughts - 0  PHQ-9 Score - 5     Review of Systems  Constitutional: Positive for diaphoresis.  Respiratory: Positive for shortness of breath.   Gastrointestinal: Positive for nausea.  Endocrine:       Low blood Sugar  Neurological: Positive for dizziness, weakness and numbness.  Psychiatric/Behavioral: Positive for confusion. The patient is nervous/anxious.        Depression  All other systems reviewed and are negative.      Objective:   Physical Exam  Constitutional: She is oriented to person, place, and time. She appears well-developed  and well-nourished.  HENT:  Head: Normocephalic and atraumatic.  Neck: Normal range of motion. Neck supple.  Cardiovascular: Normal rate and regular rhythm.   Pulmonary/Chest: Effort normal and breath sounds normal.  Musculoskeletal:  Normal Muscle Bulk and Muscle Testing Reveals: Upper Extremities: Full ROM and Muscle Strength 5/5 Thoracic Paraspinal Tenderness: T-10- T-12 Lumbar Paraspinal Tenderness: L-3- L-5 Lower Extremities: Full ROM and Muscle Strength 5/5 Arises from chair with ease Narrow Based gait  Neurological: She is alert and oriented to person, place, and time.  Skin: Skin is warm and dry.  Psychiatric: She has a normal mood and affect.  Nursing note and vitals reviewed.         Assessment & Plan:  1.Lumbar postlaminectomy syndrome status post L5-S1 fusion radiating to [id number]:Refilled: MS contin 30 mg # 90 pills--use one pill every 8 hours for pain  2. Depression: Continue Current Medication Regimen of Prozac and Wellbutrin.   15 minutes of face to face patient care time was spent during this visit. All questions were encouraged and answered

## 2015-07-22 ENCOUNTER — Other Ambulatory Visit: Payer: Self-pay | Admitting: Physical Medicine & Rehabilitation

## 2015-07-22 ENCOUNTER — Encounter: Payer: Medicare Other | Admitting: Registered Nurse

## 2015-07-23 LAB — PMP ALCOHOL METABOLITE (ETG): Ethyl Glucuronide (EtG): NEGATIVE ng/mL

## 2015-07-27 LAB — OPIATES/OPIOIDS (LC/MS-MS)
Codeine Urine: NEGATIVE ng/mL (ref <50)
HYDROCODONE: NEGATIVE ng/mL (ref <50)
HYDROMORPHONE: 516 ng/mL (ref <50)
NORHYDROCODONE, UR: NEGATIVE ng/mL (ref <50)
NOROXYCODONE, UR: NEGATIVE ng/mL (ref <50)
Oxycodone, ur: NEGATIVE ng/mL (ref <50)
Oxymorphone: NEGATIVE ng/mL (ref <50)

## 2015-07-27 LAB — FENTANYL (GC/LC/MS), URINE
Fentanyl, confirm: NEGATIVE ng/mL (ref <0.5)
NORFENTANYL (GC/MS) CONFIRM: NEGATIVE ng/mL (ref <0.5)

## 2015-07-29 ENCOUNTER — Telehealth: Payer: Self-pay | Admitting: Registered Nurse

## 2015-07-29 LAB — PRESCRIPTION MONITORING PROFILE (SOLSTAS)
AMPHETAMINE/METH: NEGATIVE ng/mL
BARBITURATE SCREEN, URINE: NEGATIVE ng/mL
Benzodiazepine Screen, Urine: NEGATIVE ng/mL
Buprenorphine, Urine: NEGATIVE ng/mL
CANNABINOID SCRN UR: NEGATIVE ng/mL
CARISOPRODOL, URINE: NEGATIVE ng/mL
CREATININE, URINE: 162.75 mg/dL (ref >20.0)
Cocaine Metabolites: NEGATIVE ng/mL
ECSTASY: NEGATIVE ng/mL
METHADONE SCREEN, URINE: NEGATIVE ng/mL
Meperidine, Ur: NEGATIVE ng/mL
NITRITES URINE, INITIAL: NEGATIVE ug/mL
OXYCODONE SCRN UR: NEGATIVE ng/mL
PH URINE, INITIAL: 5 pH (ref 4.5–8.9)
PROPOXYPHENE: NEGATIVE ng/mL
TRAMADOL UR: NEGATIVE ng/mL
Tapentadol, urine: NEGATIVE ng/mL
Zolpidem, Urine: NEGATIVE ng/mL

## 2015-07-29 NOTE — Telephone Encounter (Signed)
Placed a call to Ms. Sproule regarding her question of MRI. Spoke with Dr. Wynn BankerKirsteins. Awaiting a return call from Ms. Cavanah.

## 2015-07-30 NOTE — Progress Notes (Signed)
Urine drug screen for this encounter is consistent for prescribed medication 

## 2015-08-04 ENCOUNTER — Telehealth: Payer: Self-pay | Admitting: Registered Nurse

## 2015-08-04 NOTE — Telephone Encounter (Signed)
Mrs. Cindy Pratt returned my call today. I let her know I spoke with Dr. Wynn BankerKirsteins regarding her question for a MRI. Dr. Wynn BankerKirsteins will see her first prior to MRI being scheduled. Mrs. Cindy Pratt would like to see Dr. Wynn BankerKirsteins in January appointment scheduled she verbalizes understanding.

## 2015-08-26 ENCOUNTER — Encounter: Payer: Medicare Other | Admitting: Registered Nurse

## 2015-08-27 ENCOUNTER — Encounter: Payer: Medicare Other | Attending: Registered Nurse | Admitting: Registered Nurse

## 2015-08-27 ENCOUNTER — Encounter: Payer: Self-pay | Admitting: Registered Nurse

## 2015-08-27 VITALS — BP 104/73 | HR 67

## 2015-08-27 DIAGNOSIS — Z5181 Encounter for therapeutic drug level monitoring: Secondary | ICD-10-CM

## 2015-08-27 DIAGNOSIS — Z79899 Other long term (current) drug therapy: Secondary | ICD-10-CM

## 2015-08-27 DIAGNOSIS — M1990 Unspecified osteoarthritis, unspecified site: Secondary | ICD-10-CM | POA: Diagnosis not present

## 2015-08-27 DIAGNOSIS — E785 Hyperlipidemia, unspecified: Secondary | ICD-10-CM | POA: Insufficient documentation

## 2015-08-27 DIAGNOSIS — Z87891 Personal history of nicotine dependence: Secondary | ICD-10-CM | POA: Diagnosis not present

## 2015-08-27 DIAGNOSIS — J45909 Unspecified asthma, uncomplicated: Secondary | ICD-10-CM | POA: Insufficient documentation

## 2015-08-27 DIAGNOSIS — G894 Chronic pain syndrome: Secondary | ICD-10-CM | POA: Diagnosis not present

## 2015-08-27 DIAGNOSIS — G629 Polyneuropathy, unspecified: Secondary | ICD-10-CM | POA: Diagnosis not present

## 2015-08-27 DIAGNOSIS — M961 Postlaminectomy syndrome, not elsewhere classified: Secondary | ICD-10-CM | POA: Insufficient documentation

## 2015-08-27 DIAGNOSIS — Z981 Arthrodesis status: Secondary | ICD-10-CM | POA: Diagnosis not present

## 2015-08-27 DIAGNOSIS — M5412 Radiculopathy, cervical region: Secondary | ICD-10-CM | POA: Diagnosis not present

## 2015-08-27 DIAGNOSIS — M797 Fibromyalgia: Secondary | ICD-10-CM | POA: Insufficient documentation

## 2015-08-27 DIAGNOSIS — F329 Major depressive disorder, single episode, unspecified: Secondary | ICD-10-CM | POA: Diagnosis not present

## 2015-08-27 DIAGNOSIS — G8929 Other chronic pain: Secondary | ICD-10-CM | POA: Diagnosis present

## 2015-08-27 MED ORDER — MORPHINE SULFATE ER 30 MG PO TBCR: 30.0000 mg | EXTENDED_RELEASE_TABLET | Freq: Three times a day (TID) | ORAL | Status: DC

## 2015-08-27 NOTE — Progress Notes (Signed)
Subjective:    Patient ID: Cindy Pratt, female    DOB: 1966-04-16, 49 y.o.   MRN: [id number]  HPI: Cindy Pratt is a 49 year old female who returns for follow up for chronic pain and medication refill. She says her pain is located in her neck radiating into her right arm into her ( deltoid) and lower back radiating into left lower extremity laterally. Also states her neck pain has intensified she has an appointment with Dr. Wynn Banker he will decide regarding the MRI she verbalizes understanding. She rates her pain 7. Her current exercise regime is walking in the home.  Pain Inventory Average Pain 9 Pain Right Now 7 My pain is constant  In the last 24 hours, has pain interfered with the following? General activity 9 Relation with others 9 Enjoyment of life 9 What TIME of day is your pain at its worst? all Sleep (in general) Poor  Pain is worse with: some activites Pain improves with: medication Relief from Meds: 8  Mobility Do you have any goals in this area?  no  Function Do you have any goals in this area?  no  Neuro/Psych weakness tremor tingling dizziness confusion depression anxiety  Prior Studies Any changes since last visit?  no  Physicians involved in your care Any changes since last visit?  no   Family History  Problem Relation Age of Onset  . Hypertension Mother   . Hypertension Father   . Breast cancer Maternal Grandmother   . Liver cancer Maternal Grandmother   . Cancer Maternal Grandmother     breast cancer  . Ovarian cancer Cousin   . Cancer Cousin     1st c. maternal side/ cervical cancer  . Diabetes Maternal Grandfather     both sides  . Cancer Cousin     1st c maternal/ colon cancer   Social History   Social History  . Marital Status: Married    Spouse Name: Cindy Pratt  . Number of Children: 4  . Years of Education: M   Occupational History  . disabled    Social History Main Topics  . Smoking status: Former Smoker -- 1.00  packs/day for 20 years    Types: Cigarettes    Quit date: 07/31/2007  . Smokeless tobacco: Never Used  . Alcohol Use: No  . Drug Use: No  . Sexual Activity: Yes    Birth Control/ Protection: Surgical   Other Topics Concern  . None   Social History Narrative   Lives at home with husband Cindy Pratt   Drinks 4 sodas a day   Past Surgical History  Procedure Laterality Date  . Back surgery  1997, 2003  . Abdominal hysterectomy    . Cystectomy    . Diagnostic laparoscopy     Past Medical History  Diagnosis Date  . Fibromyalgia   . Anxiety   . Depression   . Hyperlipidemia   . Asthma   . Degenerative joint disease   . Osteoarthritis   . Belchings   . Lumbar post-laminectomy syndrome   . Lumbosacral neuritis   . Disturbance of skin sensation   . Sciatica   . Chronic pain syndrome   . Other chronic postoperative pain    BP 104/73 mmHg  Pulse 67  SpO2 98%  Opioid Risk Score:   Fall Risk Score:  `1  Depression screen PHQ 2/9  Depression screen [facility] 2/9 05/22/2015 12/02/2014  Decreased Interest 1 1  Down, Depressed, Hopeless 1  1  PHQ - 2 Score 2 2  Altered sleeping - 1  Tired, decreased energy - 1  Change in appetite - 0  Feeling bad or failure about yourself  - 0  Trouble concentrating - 1  Moving slowly or fidgety/restless - 0  Suicidal thoughts - 0  PHQ-9 Score - 5     Review of Systems  All other systems reviewed and are negative.      Objective:   Physical Exam  Constitutional: She is oriented to person, place, and time. She appears well-developed and well-nourished.  HENT:  Head: Normocephalic and atraumatic.  Neck: Normal range of motion. Neck supple.  Cervical Paraspinal Tenderness: C-3- C-6  Cardiovascular: Normal rate and regular rhythm.   Pulmonary/Chest: Effort normal and breath sounds normal.  Musculoskeletal:  Normal Muscle Bulk and Muscle Testing Reveals: Upper Extremities: Decreased ROM 90 Degrees and Muscle Strength 4/5 Thoracic  Hypersensitivity Lumbar Paraspinal Tenderness: L-3- L-5 Lower Extremities: Decreased ROM and Muscle Strength 5/5 Bilateral Lower Extremities Flexion Produces Pain into Hamstrings Arises from chair slowly Antalgic Gait   Neurological: She is alert and oriented to person, place, and time.  Skin: Skin is warm and dry.  Psychiatric: She has a normal mood and affect.  Nursing note and vitals reviewed.         Assessment & Plan:  1.Lumbar postlaminectomy syndrome status post L5-S1 fusion radiating to ZOX:[id number]:Refilled: MS contin 30 mg # 90 pills--use one pill every 8 hours for pain  2. Depression: Continue Current Medication Regimen of Prozac and Wellbutrin.   15 minutes of face to face patient care time was spent during this visit. All questions were encouraged and answered

## 2015-09-22 ENCOUNTER — Encounter: Payer: Medicare Other | Attending: Registered Nurse

## 2015-09-22 ENCOUNTER — Ambulatory Visit
Admission: RE | Admit: 2015-09-22 | Discharge: 2015-09-22 | Disposition: A | Discharge status: Home / Self Care | Payer: Medicare Other | Source: Ambulatory Visit | Attending: Nurse Practitioner | Admitting: Nurse Practitioner

## 2015-09-22 ENCOUNTER — Other Ambulatory Visit: Payer: Self-pay | Admitting: Nurse Practitioner

## 2015-09-22 ENCOUNTER — Encounter: Payer: Self-pay | Admitting: Physical Medicine & Rehabilitation

## 2015-09-22 ENCOUNTER — Ambulatory Visit (HOSPITAL_BASED_OUTPATIENT_CLINIC_OR_DEPARTMENT_OTHER): Payer: Medicare Other | Admitting: Physical Medicine & Rehabilitation

## 2015-09-22 VITALS — BP 107/70 | HR 74 | Resp 14

## 2015-09-22 DIAGNOSIS — M961 Postlaminectomy syndrome, not elsewhere classified: Secondary | ICD-10-CM

## 2015-09-22 DIAGNOSIS — M25531 Pain in right wrist: Secondary | ICD-10-CM

## 2015-09-22 DIAGNOSIS — J45909 Unspecified asthma, uncomplicated: Secondary | ICD-10-CM | POA: Insufficient documentation

## 2015-09-22 DIAGNOSIS — F329 Major depressive disorder, single episode, unspecified: Secondary | ICD-10-CM | POA: Diagnosis not present

## 2015-09-22 DIAGNOSIS — Z87891 Personal history of nicotine dependence: Secondary | ICD-10-CM | POA: Insufficient documentation

## 2015-09-22 DIAGNOSIS — M1990 Unspecified osteoarthritis, unspecified site: Secondary | ICD-10-CM | POA: Insufficient documentation

## 2015-09-22 DIAGNOSIS — G629 Polyneuropathy, unspecified: Secondary | ICD-10-CM | POA: Diagnosis not present

## 2015-09-22 DIAGNOSIS — M797 Fibromyalgia: Secondary | ICD-10-CM | POA: Diagnosis not present

## 2015-09-22 DIAGNOSIS — Z981 Arthrodesis status: Secondary | ICD-10-CM | POA: Diagnosis not present

## 2015-09-22 DIAGNOSIS — R202 Paresthesia of skin: Secondary | ICD-10-CM

## 2015-09-22 DIAGNOSIS — E785 Hyperlipidemia, unspecified: Secondary | ICD-10-CM | POA: Insufficient documentation

## 2015-09-22 DIAGNOSIS — G8929 Other chronic pain: Secondary | ICD-10-CM | POA: Diagnosis present

## 2015-09-22 MED ORDER — MORPHINE SULFATE ER 30 MG PO TBCR: 30.0000 mg | EXTENDED_RELEASE_TABLET | Freq: Three times a day (TID) | ORAL | Status: DC

## 2015-09-22 NOTE — Patient Instructions (Signed)
Please get a wrist splint to wear on the left wrist at night. You could get this at any pharmacy. It has Velcro straps

## 2015-09-22 NOTE — Progress Notes (Signed)
Subjective:    Patient ID: Cindy Pratt, female    DOB: [birth year], 50 y.o.   MRN: [id number]  HPI CC:  Neck and Left arm pain  Onset is approximate 6 months ago and slowly worsening with time. No obvious injury. This seemed to start on its own. Patient does note that she's fallen a few times over the last couple years. Neck pain has been constant for several years. The arm pain is the new problem. She notes some pain in the wrist as well as tingling in the finger when holding objects such as Bible or her tablet. wakes up with similar symptoms at night. Pain improves when she moves the arm. Patient has similar symptoms when driving. She feels numbness in the hand and some heaviness in her forearm area.  Patient denies history of carpal tunnel. She had a ganglion cyst removal on the dorsum of her left wrist in the 1990s  Neck movements do not seem to aggravate the pain in the arm  Pain Inventory Average Pain 9 Pain Right Now 9 My pain is constant, sharp, burning, dull, stabbing, tingling and aching  In the last 24 hours, has pain interfered with the following? General activity 9 Relation with others 9 Enjoyment of life 9 What TIME of day is your pain at its worst? all Sleep (in general) Poor  Pain is worse with: some activites Pain improves with: medication Relief from Meds: 8  Mobility walk without assistance Do you have any goals in this area?  no  Function Do you have any goals in this area?  no  Neuro/Psych dizziness confusion depression anxiety  Prior Studies Any changes since last visit?  no  Physicians involved in your care Any changes since last visit?  no   Family History  Problem Relation Age of Onset  . Hypertension Mother   . Hypertension Father   . Breast cancer Maternal Grandmother   . Liver cancer Maternal Grandmother   . Cancer Maternal Grandmother     breast cancer  . Ovarian cancer Cousin   . Cancer Cousin     1st c. maternal side/  cervical cancer  . Diabetes Maternal Grandfather     both sides  . Cancer Cousin     1st c maternal/ colon cancer   Social History   Social History  . Marital Status: Married    Spouse Name: [name]  . Number of Children: 4  . Years of Education: M   Occupational History  . disabled    Social History Main Topics  . Smoking status: Former Smoker -- 1.00 packs/day for 20 years    Types: Cigarettes    Quit date: 07/31/2007  . Smokeless tobacco: Never Used  . Alcohol Use: No  . Drug Use: No  . Sexual Activity: Yes    Birth Control/ Protection: Surgical   Other Topics Concern  . None   Social History Narrative   Lives at home with husband [name]   Drinks 4 sodas a day   Past Surgical History  Procedure Laterality Date  . Back surgery  1997, 2003  . Abdominal hysterectomy    . Cystectomy    . Diagnostic laparoscopy     Past Medical History  Diagnosis Date  . Fibromyalgia   . Anxiety   . Depression   . Hyperlipidemia   . Asthma   . Degenerative joint disease   . Osteoarthritis   . Belchings   . Lumbar post-laminectomy syndrome   .  Lumbosacral neuritis   . Disturbance of skin sensation   . Sciatica   . Chronic pain syndrome   . Other chronic postoperative pain    BP 107/70 mmHg  Pulse 74  Resp 14  SpO2 97%  Opioid Risk Score:   Fall Risk Score:  `1  Depression screen PHQ 2/9  Depression screen [facility] 2/9 05/22/2015 12/02/2014  Decreased Interest 1 1  Down, Depressed, Hopeless 1 1  PHQ - 2 Score 2 2  Altered sleeping - 1  Tired, decreased energy - 1  Change in appetite - 0  Feeling bad or failure about yourself  - 0  Trouble concentrating - 1  Moving slowly or fidgety/restless - 0  Suicidal thoughts - 0  PHQ-9 Score - 5     Review of Systems  All other systems reviewed and are negative.      Objective:   Physical Exam  Musculoskeletal:  Motor strength is 5/5 bilateral hip flexion and knee extensor and ankle dorsiflexor  Neurological:    Reflex Scores:      Tricep reflexes are 2+ on the right side and 2+ on the left side.      Bicep reflexes are 2+ on the right side and 2+ on the left side.      Brachioradialis reflexes are 2+ on the right side and 2+ on the left side.      Patellar reflexes are 2+ on the right side and 2+ on the left side.      Achilles reflexes are 2+ on the right side and 1+ on the left side. Decreased sensation digits 2 and 3 and 5 on the left hand compared to the right hand with pinprick sensation  Tinel's negative Phalen's negative  Motor strength 5/5 bilateral deltoid, bicep, triceps, grip    Cervical range of motion 0-25% with flexion extension lateral bending and rotation. Shoulder range of motion is full with internal/external rotation. Negative impingement sign Negative foraminal compression test, just has neck pain    Assessment & Plan:  1. Left hand and wrist pain and numbness. Differential includes median nerve compression at the wrist versus cervical radiculopathy. Because her pain seems to be more dependent upon wrist positioning rather than neck I favor the former. We'll do EMG/MCV to clarify No cervical MRI needed unless EMG/NCV is negative  2. Lumbar postlaminectomy syndrome-L5-S1 fusion, Continue morphine sulfate 30 mg3 times a day Continue opioid monitoring program. This consists of regular clinic visits, examinations, urine drug screen, pill counts as well as use of [place] controlled substance reporting System.Last urine toxicology was appropriate in November 2016

## 2015-09-26 ENCOUNTER — Ambulatory Visit: Payer: Medicare Other | Admitting: Physical Medicine & Rehabilitation

## 2015-10-03 ENCOUNTER — Other Ambulatory Visit: Payer: Self-pay | Admitting: Nurse Practitioner

## 2015-10-20 ENCOUNTER — Ambulatory Visit
Admission: RE | Admit: 2015-10-20 | Discharge: 2015-10-20 | Disposition: A | Discharge status: Home / Self Care | Payer: Medicare Other | Source: Ambulatory Visit | Attending: Physical Medicine & Rehabilitation | Admitting: Physical Medicine & Rehabilitation

## 2015-10-20 ENCOUNTER — Ambulatory Visit (HOSPITAL_BASED_OUTPATIENT_CLINIC_OR_DEPARTMENT_OTHER): Payer: Medicare Other | Admitting: Physical Medicine & Rehabilitation

## 2015-10-20 ENCOUNTER — Telehealth: Payer: Self-pay | Admitting: Physical Medicine & Rehabilitation

## 2015-10-20 ENCOUNTER — Encounter: Payer: Medicare Other | Attending: Registered Nurse

## 2015-10-20 ENCOUNTER — Encounter: Payer: Self-pay | Admitting: Physical Medicine & Rehabilitation

## 2015-10-20 VITALS — BP 117/69 | HR 69 | Resp 14

## 2015-10-20 DIAGNOSIS — M542 Cervicalgia: Secondary | ICD-10-CM

## 2015-10-20 DIAGNOSIS — Z87891 Personal history of nicotine dependence: Secondary | ICD-10-CM | POA: Insufficient documentation

## 2015-10-20 DIAGNOSIS — Z981 Arthrodesis status: Secondary | ICD-10-CM | POA: Insufficient documentation

## 2015-10-20 DIAGNOSIS — M1990 Unspecified osteoarthritis, unspecified site: Secondary | ICD-10-CM | POA: Insufficient documentation

## 2015-10-20 DIAGNOSIS — F329 Major depressive disorder, single episode, unspecified: Secondary | ICD-10-CM | POA: Insufficient documentation

## 2015-10-20 DIAGNOSIS — G629 Polyneuropathy, unspecified: Secondary | ICD-10-CM | POA: Insufficient documentation

## 2015-10-20 DIAGNOSIS — J45909 Unspecified asthma, uncomplicated: Secondary | ICD-10-CM | POA: Insufficient documentation

## 2015-10-20 DIAGNOSIS — M961 Postlaminectomy syndrome, not elsewhere classified: Secondary | ICD-10-CM | POA: Diagnosis not present

## 2015-10-20 DIAGNOSIS — G8929 Other chronic pain: Secondary | ICD-10-CM | POA: Diagnosis present

## 2015-10-20 DIAGNOSIS — E785 Hyperlipidemia, unspecified: Secondary | ICD-10-CM | POA: Insufficient documentation

## 2015-10-20 DIAGNOSIS — M797 Fibromyalgia: Secondary | ICD-10-CM | POA: Insufficient documentation

## 2015-10-20 MED ORDER — MORPHINE SULFATE ER 30 MG PO TBCR: 30.0000 mg | EXTENDED_RELEASE_TABLET | Freq: Three times a day (TID) | ORAL | Status: DC

## 2015-10-20 NOTE — Progress Notes (Signed)
Subjective:    Patient ID: Cindy Pratt, female    DOB: 11-Aug-1966, 50 y.o.   MRN: [id number]  HPI Onset is approximate 6 months ago and slowly worsening with time. No obvious injury. This seemed to start on its own. Patient does note that she's fallen a few times over the last couple years. Neck pain has been constant for several years. The arm pain is the new problem. She notes some pain in the wrist as well as tingling in the finger when holding objects such as Bible or her tablet. wakes up with similar symptoms at night. Pain improves when she moves the arm. Patient has similar symptoms when driving. She feels numbness in the hand and some heaviness in her forearm area.  Patient denies history of carpal tunnel. She had a ganglion cyst removal on the dorsum of her left wrist in the 1990s  Pain Inventory Average Pain 9 Pain Right Now 9 My pain is constant, sharp, burning, dull, stabbing, tingling and aching  In the last 24 hours, has pain interfered with the following? General activity 0 Relation with others 0 Enjoyment of life 0 What TIME of day is your pain at its worst? all Sleep (in general) NA  Pain is worse with: some activites Pain improves with: medication Relief from Meds: not answered  Mobility walk without assistance  Function disabled: date disabled .  Neuro/Psych dizziness confusion depression anxiety  Prior Studies Any changes since last visit?  no 03/08/2012 Clinical Data: Chronic neck pain for years. Worse recently.  CERVICAL SPINE - COMPLETE 4+ VIEW  Comparison: None.  Findings: Mild reversal normal cervical lordotic curve. Disc space narrowing C6-C7. No prevertebral soft tissue swelling. No significant foraminal narrowing. Clear lung apices. Negative odontoid.  IMPRESSION: Mild degenerative change as described.  Original Report Authenticated By: Elsie Stain, M.D. Physicians involved in your care Any changes since last  visit?  no   Family History  Problem Relation Age of Onset  . Hypertension Mother   . Hypertension Father   . Breast cancer Maternal Grandmother   . Liver cancer Maternal Grandmother   . Cancer Maternal Grandmother     breast cancer  . Ovarian cancer Cousin   . Cancer Cousin     1st c. maternal side/ cervical cancer  . Diabetes Maternal Grandfather     both sides  . Cancer Cousin     1st c maternal/ colon cancer   Social History   Social History  . Marital Status: Married    Spouse Name: [name]  . Number of Children: 4  . Years of Education: M   Occupational History  . disabled    Social History Main Topics  . Smoking status: Former Smoker -- 1.00 packs/day for 20 years    Types: Cigarettes    Quit date: 07/31/2007  . Smokeless tobacco: Never Used  . Alcohol Use: No  . Drug Use: No  . Sexual Activity: Yes    Birth Control/ Protection: Surgical   Other Topics Concern  . None   Social History Narrative   Lives at home with husband [name]   Drinks 4 sodas a day   Past Surgical History  Procedure Laterality Date  . Back surgery  1997, 2003  . Abdominal hysterectomy    . Cystectomy    . Diagnostic laparoscopy     Past Medical History  Diagnosis Date  . Fibromyalgia   . Anxiety   . Depression   . Hyperlipidemia   .  Asthma   . Degenerative joint disease   . Osteoarthritis   . Belchings   . Lumbar post-laminectomy syndrome   . Lumbosacral neuritis   . Disturbance of skin sensation   . Sciatica   . Chronic pain syndrome   . Other chronic postoperative pain    BP 117/69 mmHg  Pulse 69  Resp 14  SpO2 92%  Opioid Risk Score:   Fall Risk Score:  `1  Depression screen PHQ 2/9  Depression screen [facility] 2/9 10/20/2015 05/22/2015 12/02/2014  Decreased Interest 1 1 1   Down, Depressed, Hopeless 1 1 1   PHQ - 2 Score 2 2 2   Altered sleeping - - 1  Tired, decreased energy - - 1  Change in appetite - - 0  Feeling bad or failure about yourself  - - 0  Trouble  concentrating - - 1  Moving slowly or fidgety/restless - - 0  Suicidal thoughts - - 0  PHQ-9 Score - - 5     Review of Systems  All other systems reviewed and are negative.      Objective:   Physical Exam  Constitutional: She is oriented to person, place, and time. She appears well-developed and well-nourished.  HENT:  Head: Normocephalic and atraumatic.  Right Ear: External ear normal.  Left Ear: External ear normal.  Eyes: Conjunctivae and EOM are normal. Pupils are equal, round, and reactive to light.  Neck: Normal range of motion.  Musculoskeletal:       Cervical back: She exhibits decreased range of motion. She exhibits no tenderness and no deformity.  Neurological: She is alert and oriented to person, place, and time. She displays no atrophy. A sensory deficit is present. Coordination and gait normal.  Reflex Scores:      Tricep reflexes are 3+ on the right side and 3+ on the left side.      Bicep reflexes are 3+ on the right side and 3+ on the left side.      Brachioradialis reflexes are 3+ on the right side and 3+ on the left side.      Patellar reflexes are 3+ on the right side and 3+ on the left side.      Achilles reflexes are 1+ on the right side and 0 on the left side. Psychiatric: Her affect is blunt.  Nursing note and vitals reviewed. Limited cervical range of motion with flexion extension lateral bending and rotation Motor strength is 5/5 bilateral deltoid, bicep, tricep 4/5 right grip limited by wrist brace, 5/5 at the left grip. Sensation is reduced to pinprick in the left middle finger and bilateral index fingers  Gait is normal Mood and affect are flat  Negative Tinel's Cannot do Phalen's due to right wrist brace        Assessment & Plan:  1. Left hand and wrist pain and numbness.   Differential includes median nerve compression at the wrist versus cervical radiculopathy. Because her pain seems to be more dependent upon wrist positioning rather than  neck I favor the former. The patient has started complaining more of her neck pain but she denies any neck positioning influences her upper extremity pain. She has symptoms that are typical of carpal tunnel syndrome on the left side. We'll do EMG/NCV to clarify No cervical MRI needed unless EMG/NCV is negative  2. Lumbar postlaminectomy syndrome-L5-S1 fusion, Continue morphine sulfate 30 mg 3 times a day Continue opioid monitoring program. This consists of regular clinic visits, examinations, urine drug screen, pill  counts as well as use of [place] controlled substance reporting System.Last urine toxicology was appropriate in November 2016  3. Right wrist pain, undergoing further evaluation with hand surgeon. She knows she went to the hand center but could not tell me what was the name of her surgeon today. She has an MRI pending of the right wrist. X-rays suggest potential lunate avascular necrosis  4. Cervicalgia this appears to be increasing. I reviewed her last x-rays of her neck which were performed in 2013. Disc space narrow C6-7, no significant foraminal narrowing noted at that time. Printed off isometric exercises to strengthen neck anterior flexors and lateral flexors. Also instructed chin brace  Recheck x-rays of the cervical spine including obliques

## 2015-10-20 NOTE — Telephone Encounter (Signed)
Patient was seen in our office today.  By mistake patient was given a prescription for another patient, it wasn't signed.  I told her to tear up the other prescription and I would notify my manager.

## 2015-10-20 NOTE — Patient Instructions (Signed)
We'll check x-ray for your neck  See exercise sheet

## 2015-10-21 ENCOUNTER — Other Ambulatory Visit: Payer: Self-pay | Admitting: Orthopedic Surgery

## 2015-10-21 DIAGNOSIS — M25831 Other specified joint disorders, right wrist: Secondary | ICD-10-CM

## 2015-10-21 DIAGNOSIS — M92211 Osteochondrosis (juvenile) of carpal lunate [Kienbock], right hand: Secondary | ICD-10-CM

## 2015-11-05 ENCOUNTER — Ambulatory Visit
Admission: RE | Admit: 2015-11-05 | Discharge: 2015-11-05 | Disposition: A | Discharge status: Home / Self Care | Payer: Medicare Other | Source: Ambulatory Visit | Attending: Orthopedic Surgery | Admitting: Orthopedic Surgery

## 2015-11-05 DIAGNOSIS — M25831 Other specified joint disorders, right wrist: Secondary | ICD-10-CM

## 2015-11-05 DIAGNOSIS — M92211 Osteochondrosis (juvenile) of carpal lunate [Kienbock], right hand: Secondary | ICD-10-CM

## 2015-11-05 MED ORDER — IOHEXOL 180 MG/ML  SOLN
2.0000 mL | Freq: Once | INTRAMUSCULAR | Status: AC | PRN
  Administered 2015-11-05: 2 mL via INTRA_ARTICULAR

## 2015-11-12 ENCOUNTER — Other Ambulatory Visit: Payer: Self-pay | Admitting: Orthopedic Surgery

## 2015-11-12 ENCOUNTER — Encounter (HOSPITAL_BASED_OUTPATIENT_CLINIC_OR_DEPARTMENT_OTHER): Payer: Self-pay | Admitting: *Deleted

## 2015-11-13 ENCOUNTER — Telehealth: Payer: Self-pay | Admitting: *Deleted

## 2015-11-13 ENCOUNTER — Ambulatory Visit (HOSPITAL_BASED_OUTPATIENT_CLINIC_OR_DEPARTMENT_OTHER): Payer: Medicare Other | Admitting: Certified Registered"

## 2015-11-13 ENCOUNTER — Ambulatory Visit (HOSPITAL_BASED_OUTPATIENT_CLINIC_OR_DEPARTMENT_OTHER)
Admission: RE | Admit: 2015-11-13 | Discharge: 2015-11-13 | Disposition: A | Discharge status: Home / Self Care | Payer: Medicare Other | Source: Ambulatory Visit | Attending: Orthopedic Surgery | Admitting: Orthopedic Surgery

## 2015-11-13 ENCOUNTER — Encounter (HOSPITAL_BASED_OUTPATIENT_CLINIC_OR_DEPARTMENT_OTHER)
Admission: RE | Disposition: A | Discharge status: Home / Self Care | Payer: Self-pay | Source: Ambulatory Visit | Attending: Orthopedic Surgery

## 2015-11-13 ENCOUNTER — Encounter (HOSPITAL_BASED_OUTPATIENT_CLINIC_OR_DEPARTMENT_OTHER): Payer: Self-pay | Admitting: Certified Registered"

## 2015-11-13 DIAGNOSIS — Z87891 Personal history of nicotine dependence: Secondary | ICD-10-CM | POA: Insufficient documentation

## 2015-11-13 DIAGNOSIS — G894 Chronic pain syndrome: Secondary | ICD-10-CM | POA: Diagnosis not present

## 2015-11-13 DIAGNOSIS — S63511A Sprain of carpal joint of right wrist, initial encounter: Secondary | ICD-10-CM | POA: Insufficient documentation

## 2015-11-13 DIAGNOSIS — J45909 Unspecified asthma, uncomplicated: Secondary | ICD-10-CM | POA: Diagnosis not present

## 2015-11-13 DIAGNOSIS — X58XXXA Exposure to other specified factors, initial encounter: Secondary | ICD-10-CM | POA: Insufficient documentation

## 2015-11-13 DIAGNOSIS — F329 Major depressive disorder, single episode, unspecified: Secondary | ICD-10-CM | POA: Insufficient documentation

## 2015-11-13 DIAGNOSIS — S63591A Other specified sprain of right wrist, initial encounter: Secondary | ICD-10-CM | POA: Insufficient documentation

## 2015-11-13 DIAGNOSIS — E119 Type 2 diabetes mellitus without complications: Secondary | ICD-10-CM | POA: Diagnosis not present

## 2015-11-13 DIAGNOSIS — F419 Anxiety disorder, unspecified: Secondary | ICD-10-CM | POA: Diagnosis not present

## 2015-11-13 DIAGNOSIS — K219 Gastro-esophageal reflux disease without esophagitis: Secondary | ICD-10-CM | POA: Diagnosis not present

## 2015-11-13 DIAGNOSIS — M249 Joint derangement, unspecified: Secondary | ICD-10-CM | POA: Diagnosis present

## 2015-11-13 DIAGNOSIS — Z79899 Other long term (current) drug therapy: Secondary | ICD-10-CM | POA: Diagnosis not present

## 2015-11-13 HISTORY — PX: WRIST ARTHROSCOPY WITH DEBRIDEMENT: SHX6194

## 2015-11-13 HISTORY — DX: Gastro-esophageal reflux disease without esophagitis: K21.9

## 2015-11-13 SURGERY — WRIST ARTHROSCOPY WITH DEBRIDEMENT
Anesthesia: General | Site: Wrist | Laterality: Right

## 2015-11-13 MED ORDER — MEPERIDINE HCL 25 MG/ML IJ SOLN: 6.2500 mg | INTRAMUSCULAR | Status: DC | PRN

## 2015-11-13 MED ORDER — FENTANYL CITRATE (PF) 100 MCG/2ML IJ SOLN
INTRAMUSCULAR | Status: AC
  Filled 2015-11-13: qty 2

## 2015-11-13 MED ORDER — MIDAZOLAM HCL 2 MG/2ML IJ SOLN
INTRAMUSCULAR | Status: AC
  Filled 2015-11-13: qty 2

## 2015-11-13 MED ORDER — HYDROMORPHONE HCL 1 MG/ML IJ SOLN: 0.2500 mg | INTRAMUSCULAR | Status: DC | PRN

## 2015-11-13 MED ORDER — OXYCODONE HCL 5 MG/5ML PO SOLN: 5.0000 mg | Freq: Once | ORAL | Status: DC | PRN

## 2015-11-13 MED ORDER — ONDANSETRON HCL 4 MG/2ML IJ SOLN
INTRAMUSCULAR | Status: AC
  Filled 2015-11-13: qty 2

## 2015-11-13 MED ORDER — ONDANSETRON HCL 4 MG/2ML IJ SOLN
INTRAMUSCULAR | Status: DC | PRN
  Administered 2015-11-13: 4 mg via INTRAVENOUS

## 2015-11-13 MED ORDER — OXYCODONE HCL 5 MG PO TABS: 5.0000 mg | ORAL_TABLET | Freq: Once | ORAL | Status: DC | PRN

## 2015-11-13 MED ORDER — MIDAZOLAM HCL 2 MG/2ML IJ SOLN
1.0000 mg | INTRAMUSCULAR | Status: DC | PRN
  Administered 2015-11-13: 2 mg via INTRAVENOUS

## 2015-11-13 MED ORDER — DEXAMETHASONE SODIUM PHOSPHATE 10 MG/ML IJ SOLN
INTRAMUSCULAR | Status: DC | PRN
  Administered 2015-11-13: 10 mg via INTRAVENOUS

## 2015-11-13 MED ORDER — OXYCODONE-ACETAMINOPHEN 5-325 MG PO TABS: ORAL_TABLET | ORAL | Status: DC

## 2015-11-13 MED ORDER — DEXAMETHASONE SODIUM PHOSPHATE 10 MG/ML IJ SOLN
INTRAMUSCULAR | Status: AC
  Filled 2015-11-13: qty 1

## 2015-11-13 MED ORDER — CEFAZOLIN SODIUM-DEXTROSE 2-3 GM-% IV SOLR
INTRAVENOUS | Status: AC
  Filled 2015-11-13: qty 50

## 2015-11-13 MED ORDER — CHLORHEXIDINE GLUCONATE 4 % EX LIQD
60.0000 mL | Freq: Once | CUTANEOUS | Status: DC
Start: 2015-11-13 — End: 2015-11-13

## 2015-11-13 MED ORDER — PROPOFOL 10 MG/ML IV BOLUS
INTRAVENOUS | Status: DC | PRN
  Administered 2015-11-13: 160 mg via INTRAVENOUS

## 2015-11-13 MED ORDER — LIDOCAINE HCL (CARDIAC) 20 MG/ML IV SOLN
INTRAVENOUS | Status: AC
  Filled 2015-11-13: qty 5

## 2015-11-13 MED ORDER — GLYCOPYRROLATE 0.2 MG/ML IJ SOLN: 0.2000 mg | Freq: Once | INTRAMUSCULAR | Status: DC | PRN

## 2015-11-13 MED ORDER — LACTATED RINGERS IV SOLN
INTRAVENOUS | Status: DC
  Administered 2015-11-13: 12:00:00 via INTRAVENOUS

## 2015-11-13 MED ORDER — FENTANYL CITRATE (PF) 100 MCG/2ML IJ SOLN
50.0000 ug | INTRAMUSCULAR | Status: DC | PRN
  Administered 2015-11-13: 100 ug via INTRAVENOUS
  Administered 2015-11-13: 50 ug via INTRAVENOUS

## 2015-11-13 MED ORDER — SCOPOLAMINE 1 MG/3DAYS TD PT72: 1.0000 | MEDICATED_PATCH | Freq: Once | TRANSDERMAL | Status: DC | PRN

## 2015-11-13 MED ORDER — CEFAZOLIN SODIUM-DEXTROSE 2-3 GM-% IV SOLR
2.0000 g | INTRAVENOUS | Status: AC
  Administered 2015-11-13: 2 g via INTRAVENOUS

## 2015-11-13 MED ORDER — LIDOCAINE HCL (CARDIAC) 20 MG/ML IV SOLN
INTRAVENOUS | Status: DC | PRN
  Administered 2015-11-13: 50 mg via INTRAVENOUS

## 2015-11-13 SURGICAL SUPPLY — 85 items
BANDAGE ACE 3X5.8 VEL STRL LF (GAUZE/BANDAGES/DRESSINGS) ×3 IMPLANT
BANDAGE ACE 4X5 VEL STRL LF (GAUZE/BANDAGES/DRESSINGS) ×3 IMPLANT
BLADE CUDA 2.0 (BLADE) IMPLANT
BLADE EAR TYMPAN 2.5 60D BEAV (BLADE) IMPLANT
BLADE MINI RND TIP GREEN BEAV (BLADE) IMPLANT
BLADE SURG 15 STRL LF DISP TIS (BLADE) ×2 IMPLANT
BLADE SURG 15 STRL SS (BLADE) ×6
BNDG CMPR 9X4 STRL LF SNTH (GAUZE/BANDAGES/DRESSINGS)
BNDG ESMARK 4X9 LF (GAUZE/BANDAGES/DRESSINGS) IMPLANT
BNDG GAUZE ELAST 4 BULKY (GAUZE/BANDAGES/DRESSINGS) ×3 IMPLANT
BUR CUDA 2.9 (BURR) IMPLANT
BUR CUDA 2.9MM (BURR)
BUR FULL RADIUS 2.0 (BURR) ×1 IMPLANT
BUR FULL RADIUS 2.0MM (BURR) ×1
BUR FULL RADIUS 2.9 (BURR) IMPLANT
BUR FULL RADIUS 2.9MM (BURR)
BUR GATOR 2.9 (BURR) IMPLANT
BUR GATOR 2.9MM (BURR)
BUR SPHERICAL 2.9 (BURR) IMPLANT
BUR SPHERICAL 2.9MM (BURR)
CANISTER SUCT 1200ML W/VALVE (MISCELLANEOUS) IMPLANT
CHLORAPREP W/TINT 26ML (MISCELLANEOUS) ×3 IMPLANT
CLOSURE WOUND 1/2 X4 (GAUZE/BANDAGES/DRESSINGS)
CLOSURE WOUND 1/4X4 (GAUZE/BANDAGES/DRESSINGS)
CORDS BIPOLAR (ELECTRODE) IMPLANT
COVER BACK TABLE 60X90IN (DRAPES) ×3 IMPLANT
CUFF TOURNIQUET SINGLE 18IN (TOURNIQUET CUFF) ×3 IMPLANT
DRAPE EXTREMITY T 121X128X90 (DRAPE) ×3 IMPLANT
DRAPE IMP U-DRAPE 54X76 (DRAPES) ×6 IMPLANT
DRAPE OEC MINIVIEW 54X84 (DRAPES) IMPLANT
DRAPE SURG 17X23 STRL (DRAPES) ×3 IMPLANT
ELECT SMALL JOINT 90D BASC (ELECTRODE) IMPLANT
GAUZE SPONGE 4X4 12PLY STRL (GAUZE/BANDAGES/DRESSINGS) ×3 IMPLANT
GAUZE XEROFORM 1X8 LF (GAUZE/BANDAGES/DRESSINGS) ×3 IMPLANT
GLOVE BIO SURGEON STRL SZ7.5 (GLOVE) ×3 IMPLANT
GLOVE BIOGEL PI IND STRL 8.5 (GLOVE) ×1 IMPLANT
GLOVE BIOGEL PI INDICATOR 8.5 (GLOVE) ×2
GLOVE SURG ORTHO 8.0 STRL STRW (GLOVE) ×2 IMPLANT
GOWN STRL REUS W/ TWL LRG LVL3 (GOWN DISPOSABLE) ×1 IMPLANT
GOWN STRL REUS W/TWL LRG LVL3 (GOWN DISPOSABLE) ×3
GOWN STRL REUS W/TWL XL LVL3 (GOWN DISPOSABLE) ×3 IMPLANT
IV SET EXTENSION GRAVITY 40 LF (IV SETS) ×3 IMPLANT
MANIFOLD NEPTUNE II (INSTRUMENTS) IMPLANT
NDL EPIDURAL TUOHY 20GX3.5 (NEEDLE) IMPLANT
NDL SAFETY ECLIPSE 18X1.5 (NEEDLE) ×1 IMPLANT
NDL SPNL 18GX3.5 QUINCKE PK (NEEDLE) IMPLANT
NEEDLE HYPO 18GX1.5 SHARP (NEEDLE) ×3
NEEDLE HYPO 22GX1.5 SAFETY (NEEDLE) ×3 IMPLANT
NEEDLE SPNL 18GX3.5 QUINCKE PK (NEEDLE) IMPLANT
NEEDLE TUOHY 20GX3.5 (NEEDLE) IMPLANT
PACK BASIN DAY SURGERY FS (CUSTOM PROCEDURE TRAY) ×3 IMPLANT
PAD CAST 3X4 CTTN HI CHSV (CAST SUPPLIES) ×1 IMPLANT
PADDING CAST ABS 3INX4YD NS (CAST SUPPLIES) ×2
PADDING CAST ABS 4INX4YD NS (CAST SUPPLIES) ×2
PADDING CAST ABS COTTON 3X4 (CAST SUPPLIES) ×1 IMPLANT
PADDING CAST ABS COTTON 4X4 ST (CAST SUPPLIES) ×1 IMPLANT
PADDING CAST COTTON 3X4 STRL (CAST SUPPLIES) ×3
ROUTER HOODED VORTEX 2.9MM (BLADE) IMPLANT
SET ARTHROSCOPY TUBING (MISCELLANEOUS) ×3
SET ARTHROSCOPY TUBING LN (MISCELLANEOUS) IMPLANT
SET SM JOINT TUBING/CANN (CANNULA) ×2 IMPLANT
SLEEVE SCD COMPRESS KNEE MED (MISCELLANEOUS) ×3 IMPLANT
SLING ARM FOAM STRAP MED (SOFTGOODS) ×2 IMPLANT
SPLINT PLASTER CAST XFAST 3X15 (CAST SUPPLIES) ×30 IMPLANT
SPLINT PLASTER XTRA FASTSET 3X (CAST SUPPLIES) ×60
STOCKINETTE 4X48 STRL (DRAPES) ×3 IMPLANT
STRIP CLOSURE SKIN 1/2X4 (GAUZE/BANDAGES/DRESSINGS) IMPLANT
STRIP CLOSURE SKIN 1/4X4 (GAUZE/BANDAGES/DRESSINGS) IMPLANT
SUCTION FRAZIER HANDLE 10FR (MISCELLANEOUS)
SUCTION TUBE FRAZIER 10FR DISP (MISCELLANEOUS) IMPLANT
SUT ETHILON 4 0 PS 2 18 (SUTURE) IMPLANT
SUT ETHILON 5 0 P 3 18 (SUTURE)
SUT MERSILENE 4 0 P 3 (SUTURE) IMPLANT
SUT NYLON ETHILON 5-0 P-3 1X18 (SUTURE) IMPLANT
SUT PDS AB 2-0 CT2 27 (SUTURE) IMPLANT
SUT STEEL 4 0 (SUTURE) IMPLANT
SUT VIC AB 2-0 PS2 27 (SUTURE) IMPLANT
SUT VICRYL 4-0 PS2 18IN ABS (SUTURE) IMPLANT
SYR BULB 3OZ (MISCELLANEOUS) ×3 IMPLANT
SYR CONTROL 10ML LL (SYRINGE) ×3 IMPLANT
TUBE CONNECTING 20'X1/4 (TUBING)
TUBE CONNECTING 20X1/4 (TUBING) IMPLANT
UNDERPAD 30X30 (UNDERPADS AND DIAPERS) ×3 IMPLANT
WAND 1.5 MICROBLATOR (SURGICAL WAND) IMPLANT
WATER STERILE IRR 1000ML POUR (IV SOLUTION) ×3 IMPLANT

## 2015-11-13 NOTE — Op Note (Signed)
[id number] 

## 2015-11-13 NOTE — Op Note (Signed)
I assisted Surgeon(s) and Role:    * Betha LoaKevin Nyari Olsson, MD - Primary    * Cindee SaltGary Rudy Luhmann, MD - Assisting on the Procedure(s): RIGHT WRIST ARTHROSCOPY WITH DEBRIDEMENT on 11/13/2015.  I provided assistance on this case as follows: Iwith setup. Evaluation of images, suggestions of debridement and repair,followed by application of splints.  Electronically signed by: Nicki ReaperKUZMA,Kaitlyn Skowron R, MD Date: 11/13/2015 Time: 3:08 PM

## 2015-11-13 NOTE — Anesthesia Procedure Notes (Addendum)
Anesthesia Regional Block:  Supraclavicular block  Pre-Anesthetic Checklist: ,, timeout performed, Correct Patient, Correct Site, Correct Laterality, Correct Position, site marked, risks and benefits discussed,,,  Laterality: Right  Prep: chloraprep       Needles:  Injection technique: Single-shot  Needle Type: Echogenic Needle     Needle Length: 9cm 9 cm Needle Gauge: 24 and 24 G    Additional Needles:  Procedures: ultrasound guided (picture in chart) and nerve stimulator Supraclavicular block  Nerve Stimulator or Paresthesia:  Response: 0.4 mA,   Additional Responses:   Narrative:  Start time: 11/13/2015 12:02 PM Injection made incrementally with aspirations every 5 mL. Anesthesiologist: Cristela BlueJACKSON, KYLE  Additional Notes: Dr Ivin Bootyrews in attendance .5% Bupiv 20cc incrementally Tolerated well KJ   Procedure Name: LMA Insertion Performed by: York GricePEARSON, Sarajane Fambrough W Pre-anesthesia Checklist: Patient identified, Emergency Drugs available, Suction available and Patient being monitored Patient Re-evaluated:Patient Re-evaluated prior to inductionOxygen Delivery Method: Circle System Utilized Preoxygenation: Pre-oxygenation with 100% oxygen Intubation Type: IV induction Ventilation: Mask ventilation without difficulty LMA: LMA inserted LMA Size: 4.0 Number of attempts: 1 Airway Equipment and Method: bite block Placement Confirmation: positive ETCO2 Tube secured with: Tape Dental Injury: Teeth and Oropharynx as per pre-operative assessment

## 2015-11-13 NOTE — Progress Notes (Signed)
Assisted Dr. Jackson with right, ultrasound guided, supraclavicular block. Side rails up, monitors on throughout procedure. See vital signs in flow sheet. Tolerated Procedure well. 

## 2015-11-13 NOTE — Op Note (Signed)
NAMTory Emerald:  Saye, Dorcas                ACCOUNT NO.:  [id number]  MEDICAL RECORD NO.:  [id number]  LOCATION:                                 FACILITY:  PHYSICIAN:  Betha LoaKevin Barbar Brede, MD        DATE OF BIRTH:  1965-10-20  DATE OF PROCEDURE:  11/13/2015 DATE OF DISCHARGE:                              OPERATIVE REPORT   PREOPERATIVE DIAGNOSIS:  Right wrist ulnocarpal abutment versus TFCC tear.  POSTOPERATIVE DIAGNOSIS:  Right wrist TFCC central tear, lunotriquetral tear, and ulnocarpal ligament tear.  PROCEDURE:  Right wrist arthroscopy with debridement of TFCC central tear and LT ligament tear and shrinkage of ulnocarpal ligament tear.  SURGEON:  Betha LoaKevin Johnjoseph Rolfe, MD.  ASSISTANT:  Cindee SaltGary Dionisios Ricci, MD.  ANESTHESIA:  General with regional.  IV FLUIDS:  Per anesthesia flow sheet.  ESTIMATED BLOOD LOSS:  Minimal.  COMPLICATIONS:  None.  SPECIMENS:  None.  TIME OF TOURNIQUET:  None.  DISPOSITION:  Stable to PACU.  INDICATIONS:  Ms. [name] is a 50 year old, right-hand dominant female, who has been having right wrist pain for a few months.  This is bothersome to her.  An MRI shows a cystic change in the lunate and possible central TFCC tear.  We discussed wrist arthroscopy with debridement and repair as necessary.  Risks, benefits, and alternatives of surgery were discussed including risk of blood loss; infection; damage to nerves, vessels, tendons, ligaments, bone; failure of surgery; need for additional surgery; complications with wound healing; continued pain; need for further procedures.  She voiced understanding of these risks and elected to proceed.  OPERATIVE COURSE:  After being identified preoperatively by myself, the patient and I agreed upon procedure and site of procedure.  Surgical site was marked.  The risks, benefits, and alternatives of surgery were reviewed and she wished to proceed.  Surgical consent had been signed. She was given IV Ancef as preoperative antibiotic  prophylaxis.  Regional block was performed by Anesthesia in preoperative holding.  She was transferred to the operating room and placed on the operating room table in supine position with the right upper extremity on arm board.  General anesthesia was induced by Anesthesiology.  Right upper extremity was prepped and draped in normal sterile orthopedic fashion.  Surgical pause was performed between surgeons, anesthesia, and operating staff, and all were in agreement as to the patient, procedure, and site of procedure. The tourniquet was never inflated.  The arm was secured in the arthroscopy tower.  The 3/4 portal was made after insufflating the joint with sterile saline.  The skin was incised.  A hemostat was used to create the portal and the camera introduced.  The joint was inspected. There was intact scapholunate ligament.  The LT ligament was noted to have some tearing to it.  There was a central tear of the TFCC at the radial attachment.  There was patulousness to the ulnocarpal ligaments. There was synovitis at the dorsum of the wrist.  There was chondral sprain at the ridge between the scaphoid and lunate facets.  A 4/5 portal was made and the portal used for the probe, ArthroCare wand, and shaver.  The central portion of the TFCC  was debrided using combination of the shaver and ArthroCare wand.  This brought back to a stable edge. The lunotriquetral tear was debrided with the shaver and the ulnar carpal ligament tearing was shrunk with the [employer].  The remaining chondral fraying was debrided with the shaver.  A midcarpal portal was made.  Initially, a radial portal was made, but there was difficulty introducing the trocar.  A more ulnar portal was made and the camera was able to be introduced.  There was noted to be a gouge in the dorsum of the capitate where the initial portal was attempted to be placed.  There was no chondral damage.  The lunotriquetral and scapholunate  intervals were identified.  There was some fraying of the ligament in between.  There was no step-off.  There was no widening.  The arthroscopic equipment was removed.  The portals were closed with 4-0 nylon horizontal mattress fashion.  They were dressed with sterile Xeroform, 4x4s, and wrapped with a Kerlix bandage.  A volar splint was placed and wrapped with Kerlix and Ace bandage.  The operative drapes were broken down.  The patient was awoken from anesthesia safely.  She was transferred back to stretcher and taken to PACU in stable condition.  I will see her back in the office in 1 week for postoperative followup.  I will give her Percocet 5/325, 1-2 p.o. q.6 hours p.r.n. pain, dispensed #40.     Betha Loa, MD     KK/MEDQ  D:  11/13/2015  T:  11/13/2015  Job:  [id number]

## 2015-11-13 NOTE — H&P (Signed)
Cindy Pratt is an 50 y.o. female.   Chief Complaint: right wrist pain HPI: 50 yo rhd female with pain in right wrist x 2 months.  MRI shows cystic changes in lunate and possible ligament injury.  She wishes to undergo wrist arthroscopy with possible debridement and repair as necessary.  Allergies:  Allergies  Allergen Reactions  . Aspirin Shortness Of Breath  . Nsaids Shortness Of Breath  . Sulfa Antibiotics Other (See Comments) and Hives  . Sulfonamide Derivatives Hives  . Tolmetin Shortness Of Breath  . Cymbalta [Duloxetine Hcl] Other (See Comments)    confusion  . Duloxetine Other (See Comments)    confusion  . Gabapentin Other (See Comments)    Causes confusion  . Other     Aswanghda? Patient is not sure of spelling it is an herb  . Pregabalin     REACTION: confusion    Past Medical History  Diagnosis Date  . Fibromyalgia   . Anxiety   . Depression   . Hyperlipidemia   . Asthma   . Degenerative joint disease   . Osteoarthritis   . Belchings   . Lumbar post-laminectomy syndrome   . Lumbosacral neuritis   . Disturbance of skin sensation   . Sciatica   . Chronic pain syndrome   . Other chronic postoperative pain   . GERD (gastroesophageal reflux disease)     Past Surgical History  Procedure Laterality Date  . Back surgery  1997, 2003  . Abdominal hysterectomy    . Cystectomy    . Diagnostic laparoscopy    . Wrist arthroplasty Left     Family History: Family History  Problem Relation Age of Onset  . Hypertension Mother   . Hypertension Father   . Breast cancer Maternal Grandmother   . Liver cancer Maternal Grandmother   . Cancer Maternal Grandmother     breast cancer  . Ovarian cancer Cousin   . Cancer Cousin     1st c. maternal side/ cervical cancer  . Diabetes Maternal Grandfather     both sides  . Cancer Cousin     1st c maternal/ colon cancer    Social History:   reports that she quit smoking about 8 years ago. Her smoking use included  Cigarettes. She has a 20 pack-year smoking history. She has never used smokeless tobacco. She reports that she does not drink alcohol or use illicit drugs.  Medications: Medications Prior to Admission  Medication Sig Dispense Refill  . albuterol (PROAIR HFA) 108 (90 BASE) MCG/ACT inhaler Inhale 1-2 puffs into the lungs every 4 (four) hours as needed for wheezing or shortness of breath.     Marland Kitchen buPROPion (WELLBUTRIN SR) 150 MG 12 hr tablet Take 150 mg by mouth 2 (two) times daily.    Marland Kitchen esomeprazole (NEXIUM) 40 MG capsule Take 1 capsule (40 mg total) by mouth 2 (two) times daily. 60 capsule 11  . FLUoxetine (PROZAC) 20 MG capsule Take 60 mg by mouth daily.  5  . lamoTRIgine (LAMICTAL) 150 MG tablet Take 150 mg by mouth daily.    Marland Kitchen morphine (MS CONTIN) 30 MG 12 hr tablet Take 1 tablet (30 mg total) by mouth every 8 (eight) hours. 90 tablet 0  . tiZANidine (ZANAFLEX) 4 MG tablet Take 1 tablet (4 mg total) by mouth 2 (two) times daily. 60 tablet 5    No results found for this or any previous visit (from the past 48 hour(s)).  No results found.  A comprehensive review of systems was negative.   Blood pressure 127/77, pulse 68, temperature 98.1 F (36.7 C), temperature source Oral, resp. rate 22, height 5' 3.5" (1.613 m), weight 76.204 kg (168 lb), SpO2 99 %.  General appearance: alert, cooperative and appears stated age Head: Normocephalic, without obvious abnormality, atraumatic Neck: supple, symmetrical, trachea midline Resp: clear to auscultation bilaterally Cardio: regular rate and rhythm GI: non-tender Extremities: Intact sensation and capillary refill all digits.  +epl/fpl/io.  No wounds. Pulses: 2+ and symmetric Skin: Skin color, texture, turgor normal. No rashes or lesions Neurologic: Grossly normal Incision/Wound: none  Assessment/Plan Right wrist possible ulnocarpal impaction vs tfcc tear.  Plan for arthroscopy with debridement possible repair of ligament as necessary.   Risks, benefits, and alternatives of surgery were discussed and the patient agrees with the plan of care.   Cindy Pratt R 11/13/2015, 1:33 PM

## 2015-11-13 NOTE — Transfer of Care (Signed)
Immediate Anesthesia Transfer of Care Note  Patient: Cindy Pratt  Procedure(s) Performed: Procedure(s): RIGHT WRIST ARTHROSCOPY WITH DEBRIDEMENT (Right)  Patient Location: PACU  Anesthesia Type:General  Level of Consciousness: awake and sedated  Airway & Oxygen Therapy: Patient Spontanous Breathing and Patient connected to face mask oxygen  Post-op Assessment: Report given to RN and Post -op Vital signs reviewed and stable  Post vital signs: Reviewed and stable  Last Vitals:  Filed Vitals:   11/13/15 1225 11/13/15 1504  BP:    Pulse: 68 61  Temp:    Resp: 22     Complications: No apparent anesthesia complications

## 2015-11-13 NOTE — Anesthesia Preprocedure Evaluation (Addendum)
Anesthesia Evaluation  Patient identified by MRN, date of birth, ID band Patient awake    Reviewed: Allergy & Precautions, H&P , Patient's Chart, lab work & pertinent test results, reviewed documented beta blocker date and time   Airway Mallampati: II  TM Distance: >3 FB Neck ROM: full    Dental no notable dental hx.    Pulmonary asthma (Rare inghaler use; clear lungs) , former smoker,    Pulmonary exam normal breath sounds clear to auscultation       Cardiovascular  Rhythm:regular Rate:Normal     Neuro/Psych PSYCHIATRIC DISORDERS Anxiety Depression    GI/Hepatic GERD  Medicated,  Endo/Other  diabetes, Type 2, Oral Hypoglycemic Agents  Renal/GU      Musculoskeletal  (+) Arthritis , Fibromyalgia -  Abdominal   Peds  Hematology   Anesthesia Other Findings   Reproductive/Obstetrics                          Anesthesia Physical Anesthesia Plan  ASA: II  Anesthesia Plan: General   Post-op Pain Management:  Regional for Post-op pain   Induction: Intravenous  Airway Management Planned: LMA  Additional Equipment:   Intra-op Plan:   Post-operative Plan: Extubation in OR  Informed Consent: I have reviewed the patients History and Physical, chart, labs and discussed the procedure including the risks, benefits and alternatives for the proposed anesthesia with the patient or authorized representative who has indicated his/her understanding and acceptance.   Dental advisory given  Plan Discussed with: CRNA and Surgeon  Anesthesia Plan Comments: (Discussed GA with LMA, possible sore throat, potential need to switch to ETT, N/V, pulmonary aspiration. Questions answered. )       Anesthesia Quick Evaluation

## 2015-11-13 NOTE — Anesthesia Postprocedure Evaluation (Signed)
Anesthesia Post Note  Patient: Birdena JubileeLori A Ferrer  Procedure(s) Performed: Procedure(s) (LRB): RIGHT WRIST ARTHROSCOPY WITH DEBRIDEMENT (Right)  Patient location during evaluation: PACU Anesthesia Type: General Level of consciousness: awake and alert Pain management: pain level controlled Vital Signs Assessment: post-procedure vital signs reviewed and stable Respiratory status: spontaneous breathing, nonlabored ventilation and respiratory function stable Cardiovascular status: blood pressure returned to baseline and stable Postop Assessment: no signs of nausea or vomiting Anesthetic complications: no    Last Vitals:  Filed Vitals:   11/13/15 1504 11/13/15 1515  BP: 110/70   Pulse: 61 60  Temp: 36.6 C   Resp: 16 13    Last Pain:  Filed Vitals:   11/13/15 1515  PainSc: 0-No pain                 Nishita Isaacks A

## 2015-11-13 NOTE — Telephone Encounter (Signed)
duplicate

## 2015-11-13 NOTE — Telephone Encounter (Signed)
Patients husband called stating that she is having surgery and the surgeon is going to be prescribing vicodin.  I informed the husband that he did the right thing by calling and that as long as the patient is under the surgeons care she is only to take the medication that the surgeon prescribes. I informed that once she is released by the surgeon back to our care, she can resume taking the medication that we prescribe.

## 2015-11-13 NOTE — Brief Op Note (Signed)
11/13/2015  3:02 PM  PATIENT:  Birdena JubileeLori A Bruemmer  50 y.o. female  PRE-OPERATIVE DIAGNOSIS:  right ulnocarpal Impaction vs Ligament Tear  POST-OPERATIVE DIAGNOSIS:  Right lunotriquetral ligament tear, TFCC central tear, ulnocarpal ligament tear  PROCEDURE:  Procedure(s): RIGHT WRIST ARTHROSCOPY WITH DEBRIDEMENT (Right)  SURGEON:  Surgeon(s) and Role:    * Betha LoaKevin Lamoyne Hessel, MD - Primary    * Cindee SaltGary Halana Deisher, MD - Assisting  PHYSICIAN ASSISTANT:   ASSISTANTS: Cindee SaltGary Mats Jeanlouis, MD   ANESTHESIA:   regional and general  EBL:  Total I/O In: 1300 [I.V.:1300] Out: 2 [Blood:2]  BLOOD ADMINISTERED:none  DRAINS: none   LOCAL MEDICATIONS USED:  NONE  SPECIMEN:  No Specimen  DISPOSITION OF SPECIMEN:  N/A  COUNTS:  YES  TOURNIQUET:    DICTATION: .Other Dictation: Dictation Number [id number]  PLAN OF CARE: Discharge to home after PACU  PATIENT DISPOSITION:  PACU - hemodynamically stable.

## 2015-11-13 NOTE — Discharge Instructions (Addendum)
Post Anesthesia Home Care Instructions  Activity: Get plenty of rest for the remainder of the day. A responsible adult should stay with you for 24 hours following the procedure.  For the next 24 hours, DO NOT: -Drive a car -Advertising copywriterperate machinery -Drink alcoholic beverages -Take any medication unless instructed by your physician -Make any legal decisions or sign important papers.  Meals: Start with liquid foods such as gelatin or soup. Progress to regular foods as tolerated. Avoid greasy, spicy, heavy foods. If nausea and/or vomiting occur, drink only clear liquids until the nausea and/or vomiting subsides. Call your physician if vomiting continues.  Special Instructions/Symptoms: Your throat may feel dry or sore from the anesthesia or the breathing tube placed in your throat during surgery. If this causes discomfort, gargle with warm salt water. The discomfort should disappear within 24 hours.  If you had a scopolamine patch placed behind your ear for the management of post- operative nausea and/or vomiting:  1. The medication in the patch is effective for 72 hours, after which it should be removed.  Wrap patch in a tissue and discard in the trash. Wash hands thoroughly with soap and water. 2. You may remove the patch earlier than 72 hours if you experience unpleasant side effects which may include dry mouth, dizziness or visual disturbances. 3. Avoid touching the patch. Wash your hands with soap and water after contact with the patch.         Regional Anesthesia Blocks  1. Numbness or the inability to move the "blocked" extremity may last from 3-48 hours after placement. The length of time depends on the medication injected and your individual response to the medication. If the numbness is not going away after 48 hours, call your surgeon.  2. The extremity that is blocked will need to be protected until the numbness is gone and the  Strength has returned. Because you cannot feel it,  you will need to take extra care to avoid injury. Because it may be weak, you may have difficulty moving it or using it. You may not know what position it is in without looking at it while the block is in effect.  3. For blocks in the legs and feet, returning to weight bearing and walking needs to be done carefully. You will need to wait until the numbness is entirely gone and the strength has returned. You should be able to move your leg and foot normally before you try and bear weight or walk. You will need someone to be with you when you first try to ensure you do not fall and possibly risk injury.  4. Bruising and tenderness at the needle site are common side effects and will resolve in a few days.  5. Persistent numbness or new problems with movement should be communicated to the surgeon or the [facility] [phone])/ [facility] [phone]).   Hand Center Instructions Hand Surgery  Wound Care: Keep your hand elevated above the level of your heart.  Do not allow it to dangle by your side.  Keep the dressing dry and do not remove it unless your doctor advises you to do so.  He will usually change it at the time of your post-op visit.  Moving your fingers is advised to stimulate circulation but will depend on the site of your surgery.  If you have a splint applied, your doctor will advise you regarding movement.  Activity: Do not drive or operate machinery today.  Rest  today and then you may return to your normal activity and work as indicated by your physician.  Diet:  Drink liquids today or eat a light diet.  You may resume a regular diet tomorrow.    General expectations: Pain for two to three days. Fingers may become slightly swollen.  Call your doctor if any of the following occur: Severe pain not relieved by pain medication. Elevated temperature. Dressing soaked with blood. Inability to move fingers. White or bluish color to fingers.  Regional  Anesthesia Blocks  1. Numbness or the inability to move the "blocked" extremity may last from 3-48 hours after placement. The length of time depends on the medication injected and your individual response to the medication. If the numbness is not going away after 48 hours, call your surgeon.  2. The extremity that is blocked will need to be protected until the numbness is gone and the  Strength has returned. Because you cannot feel it, you will need to take extra care to avoid injury. Because it may be weak, you may have difficulty moving it or using it. You may not know what position it is in without looking at it while the block is in effect.  3. For blocks in the legs and feet, returning to weight bearing and walking needs to be done carefully. You will need to wait until the numbness is entirely gone and the strength has returned. You should be able to move your leg and foot normally before you try and bear weight or walk. You will need someone to be with you when you first try to ensure you do not fall and possibly risk injury.  4. Bruising and tenderness at the needle site are common side effects and will resolve in a few days.  5. Persistent numbness or new problems with movement should be communicated to the surgeon or the [facility] [phone] [address] [facility] [phone]).     Post Anesthesia Home Care Instructions  Activity: Get plenty of rest for the remainder of the day. A responsible adult should stay with you for 24 hours following the procedure.  For the next 24 hours, DO NOT: -Drive a car -Advertising copywriter -Drink alcoholic beverages -Take any medication unless instructed by your physician -Make any legal decisions or sign important papers.  Meals: Start with liquid foods such as gelatin or soup. Progress to regular foods as tolerated. Avoid greasy, spicy, heavy foods. If nausea and/or vomiting occur, drink only clear liquids until the nausea  and/or vomiting subsides. Call your physician if vomiting continues.  Special Instructions/Symptoms: Your throat may feel dry or sore from the anesthesia or the breathing tube placed in your throat during surgery. If this causes discomfort, gargle with warm salt water. The discomfort should disappear within 24 hours.  If you had a scopolamine patch placed behind your ear for the management of post- operative nausea and/or vomiting:  1. The medication in the patch is effective for 72 hours, after which it should be removed.  Wrap patch in a tissue and discard in the trash. Wash hands thoroughly with soap and water. 2. You may remove the patch earlier than 72 hours if you experience unpleasant side effects which may include dry mouth, dizziness or visual disturbances. 3. Avoid touching the patch. Wash your hands with soap and water after contact with the patch.

## 2015-11-14 ENCOUNTER — Encounter (HOSPITAL_BASED_OUTPATIENT_CLINIC_OR_DEPARTMENT_OTHER): Payer: Self-pay | Admitting: Orthopedic Surgery

## 2015-11-14 ENCOUNTER — Telehealth: Payer: Self-pay

## 2015-11-14 NOTE — Telephone Encounter (Signed)
Pt states that she had wrist surgery yesterday to repair 3 ligaments. They gave her Percocet to help with the pain. Rocky LinkKen spoke with husband yesterday and informed him that they can take the Percocet as long as she is under surgeons care, but needs to stop the Morphine. The pt is asking if she can still take her Morphine as directed and then take the Percocet between those times she takes the Morphine.Please advise.

## 2015-11-17 ENCOUNTER — Encounter: Payer: Self-pay | Admitting: Physical Medicine & Rehabilitation

## 2015-11-17 ENCOUNTER — Encounter: Payer: Medicare Other | Attending: Registered Nurse

## 2015-11-17 ENCOUNTER — Ambulatory Visit (HOSPITAL_BASED_OUTPATIENT_CLINIC_OR_DEPARTMENT_OTHER): Payer: Medicare Other | Admitting: Physical Medicine & Rehabilitation

## 2015-11-17 VITALS — BP 115/74 | HR 70 | Resp 14

## 2015-11-17 DIAGNOSIS — Z5181 Encounter for therapeutic drug level monitoring: Secondary | ICD-10-CM | POA: Diagnosis not present

## 2015-11-17 DIAGNOSIS — M1990 Unspecified osteoarthritis, unspecified site: Secondary | ICD-10-CM | POA: Diagnosis not present

## 2015-11-17 DIAGNOSIS — J45909 Unspecified asthma, uncomplicated: Secondary | ICD-10-CM | POA: Insufficient documentation

## 2015-11-17 DIAGNOSIS — M797 Fibromyalgia: Secondary | ICD-10-CM | POA: Diagnosis not present

## 2015-11-17 DIAGNOSIS — G894 Chronic pain syndrome: Secondary | ICD-10-CM

## 2015-11-17 DIAGNOSIS — G8929 Other chronic pain: Secondary | ICD-10-CM | POA: Diagnosis present

## 2015-11-17 DIAGNOSIS — R202 Paresthesia of skin: Secondary | ICD-10-CM | POA: Diagnosis not present

## 2015-11-17 DIAGNOSIS — Z981 Arthrodesis status: Secondary | ICD-10-CM | POA: Diagnosis not present

## 2015-11-17 DIAGNOSIS — Z87891 Personal history of nicotine dependence: Secondary | ICD-10-CM | POA: Diagnosis not present

## 2015-11-17 DIAGNOSIS — Z79899 Other long term (current) drug therapy: Secondary | ICD-10-CM | POA: Diagnosis not present

## 2015-11-17 DIAGNOSIS — M961 Postlaminectomy syndrome, not elsewhere classified: Secondary | ICD-10-CM | POA: Diagnosis not present

## 2015-11-17 DIAGNOSIS — G629 Polyneuropathy, unspecified: Secondary | ICD-10-CM | POA: Insufficient documentation

## 2015-11-17 DIAGNOSIS — E785 Hyperlipidemia, unspecified: Secondary | ICD-10-CM | POA: Diagnosis not present

## 2015-11-17 DIAGNOSIS — F329 Major depressive disorder, single episode, unspecified: Secondary | ICD-10-CM | POA: Insufficient documentation

## 2015-11-17 DIAGNOSIS — R200 Anesthesia of skin: Secondary | ICD-10-CM

## 2015-11-17 MED ORDER — MORPHINE SULFATE ER 30 MG PO TBCR: 30.0000 mg | EXTENDED_RELEASE_TABLET | Freq: Three times a day (TID) | ORAL | Status: DC

## 2015-11-17 NOTE — Patient Instructions (Addendum)
EMG today There is evidence of carpal tunnel syndrome You have a prolonged median sensory conduction across the wrist. Your ulnar nerve was normal. Median motor was normal  You may benefit from wrist injection. You may discuss this with your hand surgeon.

## 2015-11-21 LAB — 6-ACETYLMORPHINE,TOXASSURE ADD
6-ACETYLMORPHINE: NEGATIVE
6-ACETYLMORPHINE: NOT DETECTED ng/mg{creat}

## 2015-11-21 LAB — TOXASSURE SELECT,+ANTIDEPR,UR: PDF: 0

## 2015-12-01 NOTE — Progress Notes (Signed)
Urine drug screen for this encounter is consistent for prescribed medication.  Oxycodone was not taken so that not being present is actually expected.

## 2015-12-10 ENCOUNTER — Encounter: Payer: Self-pay | Admitting: Registered Nurse

## 2015-12-10 ENCOUNTER — Encounter: Payer: Medicare Other | Attending: Registered Nurse | Admitting: Registered Nurse

## 2015-12-10 VITALS — BP 112/73 | HR 68 | Resp 14

## 2015-12-10 DIAGNOSIS — Z981 Arthrodesis status: Secondary | ICD-10-CM | POA: Insufficient documentation

## 2015-12-10 DIAGNOSIS — R200 Anesthesia of skin: Secondary | ICD-10-CM

## 2015-12-10 DIAGNOSIS — G629 Polyneuropathy, unspecified: Secondary | ICD-10-CM | POA: Diagnosis not present

## 2015-12-10 DIAGNOSIS — J45909 Unspecified asthma, uncomplicated: Secondary | ICD-10-CM | POA: Insufficient documentation

## 2015-12-10 DIAGNOSIS — F329 Major depressive disorder, single episode, unspecified: Secondary | ICD-10-CM | POA: Insufficient documentation

## 2015-12-10 DIAGNOSIS — M5412 Radiculopathy, cervical region: Secondary | ICD-10-CM

## 2015-12-10 DIAGNOSIS — M961 Postlaminectomy syndrome, not elsewhere classified: Secondary | ICD-10-CM | POA: Diagnosis not present

## 2015-12-10 DIAGNOSIS — G894 Chronic pain syndrome: Secondary | ICD-10-CM | POA: Diagnosis not present

## 2015-12-10 DIAGNOSIS — Z79899 Other long term (current) drug therapy: Secondary | ICD-10-CM

## 2015-12-10 DIAGNOSIS — R202 Paresthesia of skin: Secondary | ICD-10-CM | POA: Diagnosis not present

## 2015-12-10 DIAGNOSIS — M1990 Unspecified osteoarthritis, unspecified site: Secondary | ICD-10-CM | POA: Insufficient documentation

## 2015-12-10 DIAGNOSIS — Z87891 Personal history of nicotine dependence: Secondary | ICD-10-CM | POA: Diagnosis not present

## 2015-12-10 DIAGNOSIS — E785 Hyperlipidemia, unspecified: Secondary | ICD-10-CM | POA: Diagnosis not present

## 2015-12-10 DIAGNOSIS — M797 Fibromyalgia: Secondary | ICD-10-CM | POA: Diagnosis not present

## 2015-12-10 DIAGNOSIS — Z5181 Encounter for therapeutic drug level monitoring: Secondary | ICD-10-CM

## 2015-12-10 DIAGNOSIS — G8929 Other chronic pain: Secondary | ICD-10-CM | POA: Diagnosis present

## 2015-12-10 MED ORDER — MORPHINE SULFATE ER 30 MG PO TBCR: 30.0000 mg | EXTENDED_RELEASE_TABLET | Freq: Three times a day (TID) | ORAL | Status: DC

## 2015-12-10 NOTE — Progress Notes (Signed)
Subjective:    Patient ID: Cindy Pratt, female    DOB: 1966-08-28, 50 y.o.   MRN: [id number]  HPI: Cindy Pratt is a 50 year old female who returns for follow up for chronic pain and medication refill. She states her pain is located in her neck radiating into her right arm, right wrist, bilateral shoulder's and  lower back .She rates her pain 7. Her current exercise regime is walking in the home. Cindy Pratt had surgery be Kuzma right wrist arthroscopy with debridement on 11/13/15.  S/P EMG of left hand .  Husband in room. Pain Inventory Average Pain 9 Pain Right Now 7 My pain is constant, sharp, burning, dull, stabbing, tingling and aching  In the last 24 hours, has pain interfered with the following? General activity 0 Relation with others 0 Enjoyment of life 0 What TIME of day is your pain at its worst? all Sleep (in general) Fair  Pain is worse with: some activites Pain improves with: medication Relief from Meds: 7  Mobility walk without assistance Do you have any goals in this area?  no  Function disabled: date disabled . Do you have any goals in this area?  no  Neuro/Psych weakness numbness tingling dizziness confusion depression anxiety  Prior Studies Any changes since last visit?  no  Physicians involved in your care Any changes since last visit?  no   Family History  Problem Relation Age of Onset  . Hypertension Mother   . Hypertension Father   . Breast cancer Maternal Grandmother   . Liver cancer Maternal Grandmother   . Cancer Maternal Grandmother     breast cancer  . Ovarian cancer Cousin   . Cancer Cousin     1st c. maternal side/ cervical cancer  . Diabetes Maternal Grandfather     both sides  . Cancer Cousin     1st c maternal/ colon cancer   Social History   Social History  . Marital Status: Married    Spouse Name: Velda ShellHubert  . Number of Children: 4  . Years of Education: M   Occupational History  . disabled    Social  History Main Topics  . Smoking status: Former Smoker -- 1.00 packs/day for 20 years    Types: Cigarettes    Quit date: 07/31/2007  . Smokeless tobacco: Never Used  . Alcohol Use: No  . Drug Use: No  . Sexual Activity: Yes    Birth Control/ Protection: Surgical   Other Topics Concern  . None   Social History Narrative   Lives at home with husband Velda ShellHubert   Drinks 4 sodas a day   Past Surgical History  Procedure Laterality Date  . Back surgery  1997, 2003  . Abdominal hysterectomy    . Cystectomy    . Diagnostic laparoscopy    . Wrist arthroplasty Left   . Wrist arthroscopy with debridement Right 11/13/2015    Procedure: RIGHT WRIST ARTHROSCOPY WITH DEBRIDEMENT;  Surgeon: Betha LoaKevin Kuzma, MD;  Location: [facility];  Service: Orthopedics;  Laterality: Right;   Past Medical History  Diagnosis Date  . Fibromyalgia   . Anxiety   . Depression   . Hyperlipidemia   . Asthma   . Degenerative joint disease   . Osteoarthritis   . Belchings   . Lumbar post-laminectomy syndrome   . Lumbosacral neuritis   . Disturbance of skin sensation   . Sciatica   . Chronic pain syndrome   .  Other chronic postoperative pain   . GERD (gastroesophageal reflux disease)    BP 112/73 mmHg  Pulse 68  Resp 14  SpO2 95%  Opioid Risk Score:   Fall Risk Score:  `1  Depression screen PHQ 2/9  Depression screen [facility] 2/9 10/20/2015 05/22/2015 12/02/2014  Decreased Interest Down, Depressed, Hopeless PHQ - 2 Score Altered sleeping - - 1  Tired, decreased energy - - 1  Change in appetite - - 0  Feeling bad or failure about yourself  - - 0  Trouble concentrating - - 1  Moving slowly or fidgety/restless - - 0  Suicidal thoughts - - 0  PHQ-9 Score - - 5    Review of Systems  All other systems reviewed and are negative.      Objective:   Physical Exam  Constitutional: She is oriented to person, place, and time. She appears well-developed and well-nourished.    HENT:  Head: Normocephalic and atraumatic.  Neck: Normal range of motion. Neck supple.  Cardiovascular: Normal rate and regular rhythm.   Pulmonary/Chest: Effort normal and breath sounds normal.  Musculoskeletal:  Normal Muscle Bulk and Muscle Testing Reveals: Upper Extremities: Left:Full ROM and Muscle Strength 5/5 Right: Decreased ROM 90 Degrees and Muscle Strength 3/5 Right Wrist Brace Intact Thoracic Paraspinal Tenderness: T-1- T-3 Lumbar Paraspinal Tenderness: L-3- L-5 Lower Extremities: Full ROM and Muscle Strength 5/5 Arises from chair with ease Narrow Based Gait  Neurological: She is alert and oriented to person, place, and time.  Skin: Skin is warm and dry.  Psychiatric: She has a normal mood and affect.  Nursing note and vitals reviewed.         Assessment & Plan:  1.Lumbar postlaminectomy syndrome status post L5-S1 fusion radiating to [id number]: MS contin 30 mg # 90 pills--use one pill every 8 hours for pain . We will continue the opioid monitoring program, this consists of regular clinic visits, examinations, urine drug screen, pill counts as well as use of [place] Controlled Substance reporting System. 2. Depression: Continue Current Medication Regimen of Prozac and Wellbutrin.   15 minutes of face to face patient care time was spent during this visit. All questions were encouraged and answered

## 2015-12-22 ENCOUNTER — Telehealth: Payer: Self-pay | Admitting: Physical Medicine & Rehabilitation

## 2015-12-22 MED ORDER — TIZANIDINE HCL 4 MG PO TABS: 4.0000 mg | ORAL_TABLET | Freq: Two times a day (BID) | ORAL | Status: DC

## 2015-12-22 NOTE — Telephone Encounter (Signed)
Placed a call to Cindy Pratt, she is aware her Tizanidine was ordered. She verbalizes understanding.

## 2015-12-22 NOTE — Telephone Encounter (Signed)
Patient is needing a refill on her muscle relaxer.  Her pharmacy closes at 6pm.  Please call her when this is done [phone].

## 2015-12-22 NOTE — Telephone Encounter (Signed)
Riley LamEunice please review.

## 2016-01-14 ENCOUNTER — Encounter: Payer: Self-pay | Admitting: Registered Nurse

## 2016-01-14 ENCOUNTER — Encounter: Payer: Medicare Other | Attending: Registered Nurse | Admitting: Registered Nurse

## 2016-01-14 VITALS — BP 113/62 | HR 72 | Resp 16

## 2016-01-14 DIAGNOSIS — F329 Major depressive disorder, single episode, unspecified: Secondary | ICD-10-CM | POA: Insufficient documentation

## 2016-01-14 DIAGNOSIS — G629 Polyneuropathy, unspecified: Secondary | ICD-10-CM | POA: Insufficient documentation

## 2016-01-14 DIAGNOSIS — E785 Hyperlipidemia, unspecified: Secondary | ICD-10-CM | POA: Diagnosis not present

## 2016-01-14 DIAGNOSIS — J45909 Unspecified asthma, uncomplicated: Secondary | ICD-10-CM | POA: Insufficient documentation

## 2016-01-14 DIAGNOSIS — M961 Postlaminectomy syndrome, not elsewhere classified: Secondary | ICD-10-CM | POA: Diagnosis not present

## 2016-01-14 DIAGNOSIS — Z87891 Personal history of nicotine dependence: Secondary | ICD-10-CM | POA: Insufficient documentation

## 2016-01-14 DIAGNOSIS — M1990 Unspecified osteoarthritis, unspecified site: Secondary | ICD-10-CM | POA: Diagnosis not present

## 2016-01-14 DIAGNOSIS — M797 Fibromyalgia: Secondary | ICD-10-CM | POA: Diagnosis not present

## 2016-01-14 DIAGNOSIS — Z5181 Encounter for therapeutic drug level monitoring: Secondary | ICD-10-CM

## 2016-01-14 DIAGNOSIS — G894 Chronic pain syndrome: Secondary | ICD-10-CM

## 2016-01-14 DIAGNOSIS — M5412 Radiculopathy, cervical region: Secondary | ICD-10-CM

## 2016-01-14 DIAGNOSIS — G8929 Other chronic pain: Secondary | ICD-10-CM | POA: Diagnosis present

## 2016-01-14 DIAGNOSIS — Z981 Arthrodesis status: Secondary | ICD-10-CM | POA: Diagnosis not present

## 2016-01-14 DIAGNOSIS — Z79899 Other long term (current) drug therapy: Secondary | ICD-10-CM

## 2016-01-14 DIAGNOSIS — M25551 Pain in right hip: Secondary | ICD-10-CM | POA: Diagnosis not present

## 2016-01-14 MED ORDER — MORPHINE SULFATE ER 30 MG PO TBCR: 30.0000 mg | EXTENDED_RELEASE_TABLET | Freq: Three times a day (TID) | ORAL | Status: DC

## 2016-01-14 NOTE — Progress Notes (Signed)
Subjective:     Patient ID: Cindy Pratt, female   DOB: May 17, 1966, [age] y.o.   MRN: [id number]  HPI: Cindy Pratt is a 50 year old female who returns for follow up for chronic pain and medication refill. She states her pain is located in her neck radiating into her right arm, bilateral shoulder's and lower back. Also states her pain has intensified and her MS Contin wears off in 4 hours. Will discuss with Dr., Wynn BankerKirsteins regarding adding Tramadol, she verbalizes understanding. She rates her pain 8. Her current exercise regime is walking in the home. [name] had surgery by Dr. Merlyn LotKuzma right wrist arthroscopy with debridement on 11/13/15.  Husband in room.  Pain Inventory Average Pain 9 Pain Right Now 8 My pain is sharp, burning, dull, stabbing, tingling and aching  In the last 24 hours, has pain interfered with the following? General activity 8 Relation with others 8 Enjoyment of life 8 What TIME of day is your pain at its worst? All Sleep (in general) Fair  Pain is worse with: na Pain improves with: na Relief from Meds: na  Mobility Do you have any goals in this area?  no  Function Do you have any goals in this area?  no  Neuro/Psych weakness numbness tremor tingling dizziness confusion depression anxiety  Prior Studies Any changes since last visit?  no  Physicians involved in your care Any changes since last visit?  no   Family History  Problem Relation Age of Onset  . Hypertension Mother   . Hypertension Father   . Breast cancer Maternal Grandmother   . Liver cancer Maternal Grandmother   . Cancer Maternal Grandmother     breast cancer  . Ovarian cancer Cousin   . Cancer Cousin     1st c. maternal side/ cervical cancer  . Diabetes Maternal Grandfather     both sides  . Cancer Cousin     1st c maternal/ colon cancer   Social History   Social History  . Marital Status: Married    Spouse Name: Velda ShellHubert  . Number of Children: 4  . Years of  Education: M   Occupational History  . disabled    Social History Main Topics  . Smoking status: Former Smoker -- 1.00 packs/day for 20 years    Types: Cigarettes    Quit date: 07/31/2007  . Smokeless tobacco: Never Used  . Alcohol Use: No  . Drug Use: No  . Sexual Activity: Yes    Birth Control/ Protection: Surgical   Other Topics Concern  . None   Social History Narrative   Lives at home with husband Velda ShellHubert   Drinks 4 sodas a day   Past Surgical History  Procedure Laterality Date  . Back surgery  1997, 2003  . Abdominal hysterectomy    . Cystectomy    . Diagnostic laparoscopy    . Wrist arthroplasty Left   . Wrist arthroscopy with debridement Right 11/13/2015    Procedure: RIGHT WRIST ARTHROSCOPY WITH DEBRIDEMENT;  Surgeon: Betha LoaKevin Kuzma, MD;  Location: [facility];  Service: Orthopedics;  Laterality: Right;   Past Medical History  Diagnosis Date  . Fibromyalgia   . Anxiety   . Depression   . Hyperlipidemia   . Asthma   . Degenerative joint disease   . Osteoarthritis   . Belchings   . Lumbar post-laminectomy syndrome   . Lumbosacral neuritis   . Disturbance of skin sensation   . Sciatica   .  Chronic pain syndrome   . Other chronic postoperative pain   . GERD (gastroesophageal reflux disease)    BP 113/62 mmHg  Pulse 72  Resp 16  SpO2 97%  Opioid Risk Score:   Fall Risk Score:  `1  Depression screen PHQ 2/9  Depression screen [facility] 2/9 10/20/2015 05/22/2015 12/02/2014  Decreased Interest Down, Depressed, Hopeless PHQ - 2 Score Altered sleeping - - 1  Tired, decreased energy - - 1  Change in appetite - - 0  Feeling bad or failure about yourself  - - 0  Trouble concentrating - - 1  Moving slowly or fidgety/restless - - 0  Suicidal thoughts - - 0  PHQ-9 Score - - 5      Review of Systems  All other systems reviewed and are negative.      Objective:   Physical Exam  Constitutional: She is oriented to person,  place, and time. She appears well-developed and well-nourished.  HENT:  Head: Normocephalic and atraumatic.  Neck: Normal range of motion. Neck supple.  Cardiovascular: Normal rate and regular rhythm.   Pulmonary/Chest: Effort normal and breath sounds normal.  Musculoskeletal:  Normal Muscle Bulk and Muscle Testing Reveals: Upper Extremities: Decreased ROM 90 Degrees and Muscle Strength 4/5 Lumbar Paraspinal Tenderness: L-3- L-5 Lower Extremities: Full ROM and Muscle Strength 5/5 Arises from chair with ease Narrow Based Gait   Neurological: She is alert and oriented to person, place, and time.  Skin: Skin is warm and dry.  Psychiatric: She has a normal mood and affect.  Nursing note and vitals reviewed.      Assessment:Plan  1.Lumbar postlaminectomy syndrome status post L5-S1 fusion radiating to [id number]: MS contin 30 mg # 90 pills--use one pill every 8 hours for pain . We will continue the opioid monitoring program, this consists of regular clinic visits, examinations, urine drug screen, pill counts as well as use of [place] Controlled Substance reporting System. 2. Depression: Continue Current Medication Regimen of Prozac and Wellbutrin.   20 minutes of face to face patient care time was spent during this visit. All questions were encouraged and answered

## 2016-01-15 ENCOUNTER — Telehealth: Payer: Self-pay | Admitting: Registered Nurse

## 2016-01-15 NOTE — Telephone Encounter (Signed)
Placed a call to Cindy Pratt, no answer left message to return the call.

## 2016-01-19 ENCOUNTER — Telehealth: Payer: Self-pay

## 2016-01-19 MED ORDER — TRAMADOL HCL 50 MG PO TABS: 50.0000 mg | ORAL_TABLET | Freq: Two times a day (BID) | ORAL | Status: DC | PRN

## 2016-01-19 NOTE — Telephone Encounter (Signed)
Pt is returning a phone call from ET.

## 2016-01-19 NOTE — Telephone Encounter (Signed)
Return Ms. Finigan call, we will order Tramadol 50 mg BID as needed, she verbalizes understanding. Dr. Wynn BankerKirsteins agrees with the above.

## 2016-02-16 ENCOUNTER — Encounter: Payer: Self-pay | Admitting: Registered Nurse

## 2016-02-16 ENCOUNTER — Encounter: Payer: Medicare Other | Attending: Registered Nurse | Admitting: Registered Nurse

## 2016-02-16 VITALS — BP 120/81 | HR 82 | Resp 14

## 2016-02-16 DIAGNOSIS — M797 Fibromyalgia: Secondary | ICD-10-CM | POA: Diagnosis not present

## 2016-02-16 DIAGNOSIS — M5417 Radiculopathy, lumbosacral region: Secondary | ICD-10-CM | POA: Diagnosis not present

## 2016-02-16 DIAGNOSIS — F329 Major depressive disorder, single episode, unspecified: Secondary | ICD-10-CM | POA: Insufficient documentation

## 2016-02-16 DIAGNOSIS — M961 Postlaminectomy syndrome, not elsewhere classified: Secondary | ICD-10-CM | POA: Insufficient documentation

## 2016-02-16 DIAGNOSIS — M5416 Radiculopathy, lumbar region: Secondary | ICD-10-CM

## 2016-02-16 DIAGNOSIS — Z981 Arthrodesis status: Secondary | ICD-10-CM | POA: Insufficient documentation

## 2016-02-16 DIAGNOSIS — Z87891 Personal history of nicotine dependence: Secondary | ICD-10-CM | POA: Insufficient documentation

## 2016-02-16 DIAGNOSIS — M1990 Unspecified osteoarthritis, unspecified site: Secondary | ICD-10-CM | POA: Insufficient documentation

## 2016-02-16 DIAGNOSIS — E785 Hyperlipidemia, unspecified: Secondary | ICD-10-CM | POA: Diagnosis not present

## 2016-02-16 DIAGNOSIS — G629 Polyneuropathy, unspecified: Secondary | ICD-10-CM | POA: Diagnosis not present

## 2016-02-16 DIAGNOSIS — G894 Chronic pain syndrome: Secondary | ICD-10-CM

## 2016-02-16 DIAGNOSIS — Z79899 Other long term (current) drug therapy: Secondary | ICD-10-CM

## 2016-02-16 DIAGNOSIS — J45909 Unspecified asthma, uncomplicated: Secondary | ICD-10-CM | POA: Diagnosis not present

## 2016-02-16 DIAGNOSIS — G8929 Other chronic pain: Secondary | ICD-10-CM | POA: Diagnosis present

## 2016-02-16 DIAGNOSIS — Z5181 Encounter for therapeutic drug level monitoring: Secondary | ICD-10-CM

## 2016-02-16 DIAGNOSIS — R251 Tremor, unspecified: Secondary | ICD-10-CM

## 2016-02-16 MED ORDER — MORPHINE SULFATE ER 30 MG PO TBCR: 30.0000 mg | EXTENDED_RELEASE_TABLET | Freq: Three times a day (TID) | ORAL | Status: DC

## 2016-02-16 NOTE — Progress Notes (Signed)
Subjective:    Patient ID: Cindy Pratt, female    DOB: 1966-04-16, 50 y.o.   MRN: [id number]  HPI: Cindy Pratt is a 50 year old female who returns for follow up for chronic pain and medication refill. She states her pain is located in her neck radiating into her right arm, bilateral shoulder's and lower back radiating into her right hip and right lower extremity anteriorly . She rates her pain 9. Her current exercise regime is walking in the home. Also states she has been having tremors for the past 8 months, she hasn't told any of her providers. She has resting tremor noted, referral placed to her neurologist Dr. Oswaldo Conroy at [facility].   Cindy Pratt had surgery by Dr. Merlyn Lot right wrist arthroscopy with debridement on 11/13/15.  Pain Inventory Average Pain 9 Pain Right Now 9 My pain is na  In the last 24 hours, has pain interfered with the following? General activity 0 Relation with others 0 Enjoyment of life 0 What TIME of day is your pain at its worst? na Sleep (in general) NA  Pain is worse with: na Pain improves with: na Relief from Meds: na  Mobility Do you have any goals in this area?  no  Function Do you have any goals in this area?  no  Neuro/Psych No problems in this area  Prior Studies Any changes since last visit?  no  Physicians involved in your care Any changes since last visit?  no   Family History  Problem Relation Age of Onset  . Hypertension Mother   . Hypertension Father   . Breast cancer Maternal Grandmother   . Liver cancer Maternal Grandmother   . Cancer Maternal Grandmother     breast cancer  . Ovarian cancer Cousin   . Cancer Cousin     1st c. maternal side/ cervical cancer  . Diabetes Maternal Grandfather     both sides  . Cancer Cousin     1st c maternal/ colon cancer   Social History   Social History  . Marital Status: Married    Spouse Name: [name]  . Number of Children: 4  . Years of Education: M   Occupational History    . disabled    Social History Main Topics  . Smoking status: Former Smoker -- 1.00 packs/day for 20 years    Types: Cigarettes    Quit date: 07/31/2007  . Smokeless tobacco: Never Used  . Alcohol Use: No  . Drug Use: No  . Sexual Activity: Yes    Birth Control/ Protection: Surgical   Other Topics Concern  . None   Social History Narrative   Lives at home with husband [name]   Drinks 4 sodas a day   Past Surgical History  Procedure Laterality Date  . Back surgery  1997, 2003  . Abdominal hysterectomy    . Cystectomy    . Diagnostic laparoscopy    . Wrist arthroplasty Left   . Wrist arthroscopy with debridement Right 11/13/2015    Procedure: RIGHT WRIST ARTHROSCOPY WITH DEBRIDEMENT;  Surgeon: Betha Loa, MD;  Location:  SURGERY CENTER;  Service: Orthopedics;  Laterality: Right;   Past Medical History  Diagnosis Date  . Fibromyalgia   . Anxiety   . Depression   . Hyperlipidemia   . Asthma   . Degenerative joint disease   . Osteoarthritis   . Belchings   . Lumbar post-laminectomy syndrome   . Lumbosacral neuritis   .  Disturbance of skin sensation   . Sciatica   . Chronic pain syndrome   . Other chronic postoperative pain   . GERD (gastroesophageal reflux disease)    BP 120/81 mmHg  Pulse 82  Resp 14  SpO2 97%  Opioid Risk Score:   Fall Risk Score:  `1  Depression screen PHQ 2/9  [facility] 02/16/2016 10/20/2015 05/22/2015 12/02/2014  Decreased Interest 0 1 1 1   Down, Depressed, Hopeless 0 1 1 1   PHQ - 2 Score 0 2 2 2   Altered sleeping - - - 1  Tired, decreased energy - - - 1  Change in appetite - - - 0  Feeling bad or failure about yourself  - - - 0  Trouble concentrating - - - 1  Moving slowly or fidgety/restless - - - 0  Suicidal thoughts - - - 0  PHQ-9 Score - - - 5       Review of Systems  All other systems reviewed and are negative.      Objective:   Physical Exam  Constitutional: She is oriented to person, place,  and time. She appears well-developed and well-nourished.  HENT:  Head: Normocephalic and atraumatic.  Neck: Normal range of motion. Neck supple.  Cardiovascular: Normal rate and regular rhythm.   Pulmonary/Chest: Effort normal and breath sounds normal.  Musculoskeletal:  Normal Muscle Bulk and Muscle Testing Reveals: Upper Extremities: Full ROM and Muscle Strength 5/5 Lumbar Paraspinal Tenderness: L-3- L-5 Lower Extremities: Full ROM and Muscle Strength 5/5 Arises from chair with ease Narrow Based Gait  Neurological: She is alert and oriented to person, place, and time.  Skin: Skin is warm and dry.  Psychiatric: She has a normal mood and affect.  Nursing note and vitals reviewed.         Assessment & Plan:  1.Lumbar postlaminectomy syndrome status post L5-S1 fusion radiating to NWG:[id number]:Refilled: MS contin 30 mg # 90 pills--use one pill every 8 hours for pain and Continue Tramadol 50 mg BID. #60. We will continue the opioid monitoring program, this consists of regular clinic visits, examinations, urine drug screen, pill counts as well as use of [place] Controlled Substance reporting System. 2. Depression: Continue Current Medication Regimen of Prozac, Lamictal and Wellbutrin. PCP following 3. Tremors: RX: Neurology Referral: Dr. Oswaldo ConroyPenamali  20 minutes of face to face patient care time was spent during this visit. All questions were encouraged and answered

## 2016-03-01 ENCOUNTER — Telehealth: Payer: Self-pay | Admitting: Diagnostic Neuroimaging

## 2016-03-01 ENCOUNTER — Ambulatory Visit: Payer: Medicare Other | Admitting: Diagnostic Neuroimaging

## 2016-03-01 NOTE — Telephone Encounter (Signed)
Phone call to patient 7/3 12:02pm [facility]MTC regarding rescheduling same day cancellation 03/01/16, per Angie A.

## 2016-03-03 ENCOUNTER — Encounter: Payer: Self-pay | Admitting: Diagnostic Neuroimaging

## 2016-03-08 ENCOUNTER — Encounter (HOSPITAL_COMMUNITY): Payer: Self-pay | Admitting: Emergency Medicine

## 2016-03-08 ENCOUNTER — Emergency Department (HOSPITAL_COMMUNITY)
Admission: EM | Admit: 2016-03-08 | Discharge: 2016-03-08 | Disposition: A | Discharge status: Home / Self Care | Payer: Medicare Other | Attending: Emergency Medicine | Admitting: Emergency Medicine

## 2016-03-08 ENCOUNTER — Emergency Department (HOSPITAL_COMMUNITY): Payer: Medicare Other

## 2016-03-08 DIAGNOSIS — E785 Hyperlipidemia, unspecified: Secondary | ICD-10-CM | POA: Diagnosis not present

## 2016-03-08 DIAGNOSIS — Z87891 Personal history of nicotine dependence: Secondary | ICD-10-CM | POA: Insufficient documentation

## 2016-03-08 DIAGNOSIS — J45909 Unspecified asthma, uncomplicated: Secondary | ICD-10-CM | POA: Insufficient documentation

## 2016-03-08 DIAGNOSIS — Y999 Unspecified external cause status: Secondary | ICD-10-CM | POA: Insufficient documentation

## 2016-03-08 DIAGNOSIS — X58XXXA Exposure to other specified factors, initial encounter: Secondary | ICD-10-CM | POA: Insufficient documentation

## 2016-03-08 DIAGNOSIS — R110 Nausea: Secondary | ICD-10-CM | POA: Diagnosis not present

## 2016-03-08 DIAGNOSIS — Y939 Activity, unspecified: Secondary | ICD-10-CM | POA: Insufficient documentation

## 2016-03-08 DIAGNOSIS — F329 Major depressive disorder, single episode, unspecified: Secondary | ICD-10-CM | POA: Insufficient documentation

## 2016-03-08 DIAGNOSIS — M1990 Unspecified osteoarthritis, unspecified site: Secondary | ICD-10-CM | POA: Insufficient documentation

## 2016-03-08 DIAGNOSIS — Y929 Unspecified place or not applicable: Secondary | ICD-10-CM | POA: Insufficient documentation

## 2016-03-08 DIAGNOSIS — Z79899 Other long term (current) drug therapy: Secondary | ICD-10-CM | POA: Insufficient documentation

## 2016-03-08 DIAGNOSIS — S0003XA Contusion of scalp, initial encounter: Secondary | ICD-10-CM

## 2016-03-08 DIAGNOSIS — S0990XA Unspecified injury of head, initial encounter: Secondary | ICD-10-CM | POA: Diagnosis present

## 2016-03-08 NOTE — ED Notes (Signed)
Pt states she noticed a knot on top of her head this morning.  States she does not remember hitting head or any injury.

## 2016-03-08 NOTE — Discharge Instructions (Signed)

## 2016-03-08 NOTE — ED Provider Notes (Signed)
CSN: [id number]     Arrival date & time 03/08/16  1309 History  By signing my name below, I, Cindy Pratt, attest that this documentation has been prepared under the direction and in the presence of Langston MaskerKaren Dylyn Mclaren, [place]PA-C. Electronically Signed: [name], Scribe. 03/08/2016. 2:39 PM.   Chief Complaint  Patient presents with  . Head Injury    The history is provided by the patient. No language interpreter was used.     HPI Comments: Cindy JubileeLori A Pratt is a 50 y.o. female who presents to the Emergency Department complaining of a "knot" on her scalp that she noticed this morning. She denies recent injury or trauma to her head. Pt endorses shooting pain through the knot when she bends down. She states that she experiences intermittent headaches at baseline and notes she became concerned for a potential serious medical issue when she noticed the knot. She also endorses associated intermittent nausea since onset. She denies dizziness or any other associated symptoms.  PCP: Dr. Toni ArthursFuller Pt reports she may have hit the top of her head.  Pt fell.  Pt complains of pain at site Past Medical History  Diagnosis Date  . Fibromyalgia   . Anxiety   . Depression   . Hyperlipidemia   . Asthma   . Degenerative joint disease   . Osteoarthritis   . Belchings   . Lumbar post-laminectomy syndrome   . Lumbosacral neuritis   . Disturbance of skin sensation   . Sciatica   . Chronic pain syndrome   . Other chronic postoperative pain   . GERD (gastroesophageal reflux disease)    Past Surgical History  Procedure Laterality Date  . Back surgery  1997, 2003  . Abdominal hysterectomy    . Cystectomy    . Diagnostic laparoscopy    . Wrist arthroplasty Left   . Wrist arthroscopy with debridement Right 11/13/2015    Procedure: RIGHT WRIST ARTHROSCOPY WITH DEBRIDEMENT;  Surgeon: Betha LoaKevin Kuzma, MD;  Location: [facility];  Service: Orthopedics;  Laterality: Right;   Family History  Problem Relation  Age of Onset  . Hypertension Mother   . Hypertension Father   . Breast cancer Maternal Grandmother   . Liver cancer Maternal Grandmother   . Cancer Maternal Grandmother     breast cancer  . Ovarian cancer Cousin   . Cancer Cousin     1st c. maternal side/ cervical cancer  . Diabetes Maternal Grandfather     both sides  . Cancer Cousin     1st c maternal/ colon cancer   Social History  Substance Use Topics  . Smoking status: Former Smoker -- 1.00 packs/day for 20 years    Types: Cigarettes    Quit date: 07/31/2007  . Smokeless tobacco: Never Used  . Alcohol Use: No   OB History    No data available     Review of Systems  HENT: Positive for facial swelling ("knot" on scalp).   Gastrointestinal: Positive for nausea.  Neurological: Positive for headaches (at baseline). Negative for dizziness.  All other systems reviewed and are negative.   Allergies  Aspirin; Nsaids; Sulfa antibiotics; Sulfonamide derivatives; Tolmetin; Cymbalta; Duloxetine; Gabapentin; Other; and Pregabalin  Home Medications   Prior to Admission medications   Medication Sig Start Date End Date Taking? Authorizing Provider  albuterol (PROAIR HFA) 108 (90 BASE) MCG/ACT inhaler Inhale 1-2 puffs into the lungs every 4 (four) hours as needed for wheezing or shortness of breath.  02/24/15 02/24/16  Historical  Provider, MD  buPROPion (WELLBUTRIN SR) 150 MG 12 hr tablet Take 150 mg by mouth 2 (two) times daily.    Historical Provider, MD  esomeprazole (NEXIUM) 40 MG capsule Take 1 capsule (40 mg total) by mouth 2 (two) times daily. 09/14/12 12/02/15  Rachael Fee, MD  FLUoxetine (PROZAC) 20 MG capsule Take 60 mg by mouth daily. 03/28/15   Historical Provider, MD  lamoTRIgine (LAMICTAL) 150 MG tablet Take 150 mg by mouth daily.    Historical Provider, MD  morphine (MS CONTIN) 30 MG 12 hr tablet Take 1 tablet (30 mg total) by mouth every 8 (eight) hours. 02/16/16   Jones Bales, NP  tiZANidine (ZANAFLEX) 4 MG tablet  Take 1 tablet (4 mg total) by mouth 2 (two) times daily. 12/22/15   Jones Bales, NP  traMADol (ULTRAM) 50 MG tablet Take 1 tablet (50 mg total) by mouth 2 (two) times daily as needed. 01/19/16   Jones Bales, NP   BP 125/69 mmHg  Pulse 66  Temp(Src) 98 F (36.7 C) (Temporal)  Resp 16  Ht 5\' 4"  (1.626 m)  Wt 165 lb (74.844 kg)  BMI 28.31 kg/m2  SpO2 99% Physical Exam  Constitutional: She is oriented to person, place, and time. She appears well-developed and well-nourished. No distress.  HENT:  Head: Normocephalic and atraumatic.  2.5 x 1 cm midline frontal scalp swelling.  Eyes: Conjunctivae and EOM are normal.  Neck: Neck supple. No tracheal deviation present.  Cardiovascular: Normal rate.   Pulmonary/Chest: Effort normal. No respiratory distress.  Musculoskeletal: Normal range of motion.  Neurological: She is alert and oriented to person, place, and time.  Skin: Skin is warm and dry.  Psychiatric: She has a normal mood and affect. Her behavior is normal.  Nursing note and vitals reviewed.   ED Course  Procedures (including critical care time)  DIAGNOSTIC STUDIES: Oxygen Saturation is 99% on RA, normal by my interpretation.    COORDINATION OF CARE: 2:39 PM Discussed treatment plan with pt at bedside and pt agreed to plan.  Labs Review Labs Reviewed - No data to display  Imaging Review No results found. I have personally reviewed and evaluated these images and lab results as part of my medical decision-making.   EKG Interpretation None      MDM   Final diagnoses:  Contusion of scalp, initial encounter    Meds ordered this encounter  Medications  . lurasidone (LATUDA) 40 MG TABS tablet    Sig: Take 40 mg by mouth daily with breakfast.  . CALCIUM PO    Sig: Take 1 tablet by mouth daily.  . Cholecalciferol (VITAMIN D-3 PO)    Sig: Take 1 tablet by mouth daily.  . Multiple Vitamin (MULTIVITAMIN WITH MINERALS) TABS tablet    Sig: Take 1 tablet by mouth  daily.  . Multiple Vitamins-Minerals (HAIR SKIN NAILS PO)    Sig: Take 1 tablet by mouth daily.  . B Complex-Folic Acid (SUPER B COMPLEX MAXI PO)    Sig: Take 1 tablet by mouth daily.  Marland Kitchen loratadine (CLARITIN) 10 MG tablet    Sig: Take 10 mg by mouth daily.  . Omega-3 Fatty Acids (FISH OIL PO)    Sig: Take 1 capsule by mouth daily.  An After Visit Summary was printed and given to the patient.  I personally performed the services in this documentation, which was scribed in my presence.  The recorded information has been reviewed and considered.   Barnet Pall.  Elson Areas, PA-C 03/08/16 1641  Lonia Skinner [place], PA-C 03/08/16 1641  Derwood Kaplan, MD 03/10/16 [phone]

## 2016-03-15 ENCOUNTER — Encounter: Payer: Medicare Other | Attending: Registered Nurse | Admitting: Registered Nurse

## 2016-03-15 ENCOUNTER — Encounter: Payer: Self-pay | Admitting: Registered Nurse

## 2016-03-15 ENCOUNTER — Ambulatory Visit
Admission: RE | Admit: 2016-03-15 | Discharge: 2016-03-15 | Disposition: A | Discharge status: Home / Self Care | Payer: Medicare Other | Source: Ambulatory Visit | Attending: Registered Nurse | Admitting: Registered Nurse

## 2016-03-15 VITALS — BP 121/82 | HR 80

## 2016-03-15 DIAGNOSIS — M961 Postlaminectomy syndrome, not elsewhere classified: Secondary | ICD-10-CM | POA: Diagnosis not present

## 2016-03-15 DIAGNOSIS — G894 Chronic pain syndrome: Secondary | ICD-10-CM

## 2016-03-15 DIAGNOSIS — M1990 Unspecified osteoarthritis, unspecified site: Secondary | ICD-10-CM | POA: Diagnosis not present

## 2016-03-15 DIAGNOSIS — R51 Headache: Secondary | ICD-10-CM

## 2016-03-15 DIAGNOSIS — M5417 Radiculopathy, lumbosacral region: Secondary | ICD-10-CM | POA: Diagnosis not present

## 2016-03-15 DIAGNOSIS — M5416 Radiculopathy, lumbar region: Secondary | ICD-10-CM

## 2016-03-15 DIAGNOSIS — G8929 Other chronic pain: Secondary | ICD-10-CM | POA: Diagnosis present

## 2016-03-15 DIAGNOSIS — G629 Polyneuropathy, unspecified: Secondary | ICD-10-CM | POA: Diagnosis not present

## 2016-03-15 DIAGNOSIS — Z87891 Personal history of nicotine dependence: Secondary | ICD-10-CM | POA: Insufficient documentation

## 2016-03-15 DIAGNOSIS — M542 Cervicalgia: Secondary | ICD-10-CM

## 2016-03-15 DIAGNOSIS — E785 Hyperlipidemia, unspecified: Secondary | ICD-10-CM | POA: Diagnosis not present

## 2016-03-15 DIAGNOSIS — F329 Major depressive disorder, single episode, unspecified: Secondary | ICD-10-CM | POA: Diagnosis not present

## 2016-03-15 DIAGNOSIS — R519 Headache, unspecified: Secondary | ICD-10-CM

## 2016-03-15 DIAGNOSIS — J45909 Unspecified asthma, uncomplicated: Secondary | ICD-10-CM | POA: Diagnosis not present

## 2016-03-15 DIAGNOSIS — Z5181 Encounter for therapeutic drug level monitoring: Secondary | ICD-10-CM

## 2016-03-15 DIAGNOSIS — Z981 Arthrodesis status: Secondary | ICD-10-CM | POA: Insufficient documentation

## 2016-03-15 DIAGNOSIS — M797 Fibromyalgia: Secondary | ICD-10-CM

## 2016-03-15 DIAGNOSIS — M5412 Radiculopathy, cervical region: Secondary | ICD-10-CM

## 2016-03-15 DIAGNOSIS — Z79899 Other long term (current) drug therapy: Secondary | ICD-10-CM

## 2016-03-15 MED ORDER — MORPHINE SULFATE ER 30 MG PO TBCR
30.0000 mg | EXTENDED_RELEASE_TABLET | Freq: Three times a day (TID) | ORAL | Status: DC
Start: 2016-03-15 — End: 2016-04-15

## 2016-03-15 MED ORDER — TRAMADOL HCL 50 MG PO TABS: 50.0000 mg | ORAL_TABLET | Freq: Two times a day (BID) | ORAL | Status: DC | PRN

## 2016-03-15 NOTE — Progress Notes (Signed)
Subjective:    Patient ID: Cindy Pratt, female    DOB: 12-Oct-1965, 50 y.o.   MRN: [id number]  HPI: Cindy Pratt is a 50 year old female who returns for follow up for chronic pain and medication refill. She states her pain is located in her neck radiating into her right shoulder and right arm and lower back radiating into her right hip and right lower extremity anteriorly . Also states she fell on July 5th she tripped over the dog bowl she wasn't sure where she landed, she went to [facility] for evaluation on March 08, 2016. States she has a headache since yesterday. She rates her pain 10. Her current exercise regime is walking in the home.   Pain Inventory Average Pain 9 Pain Right Now 10 My pain is intermittent, constant, sharp, burning, dull, stabbing, tingling and aching  In the last 24 hours, has pain interfered with the following? General activity 0 Relation with others 0 Enjoyment of life 0 What TIME of day is your pain at its worst? all Sleep (in general) NA  Pain is worse with: no selection Pain improves with: no selection Relief from Meds: no selection  Mobility Do you have any goals in this area?  no  Function Do you have any goals in this area?  no  Neuro/Psych dizziness confusion depression anxiety  Prior Studies Any changes since last visit?  no  Physicians involved in your care Any changes since last visit?  no   Family History  Problem Relation Age of Onset  . Hypertension Mother   . Hypertension Father   . Breast cancer Maternal Grandmother   . Liver cancer Maternal Grandmother   . Cancer Maternal Grandmother     breast cancer  . Ovarian cancer Cousin   . Cancer Cousin     1st c. maternal side/ cervical cancer  . Diabetes Maternal Grandfather     both sides  . Cancer Cousin     1st c maternal/ colon cancer   Social History   Social History  . Marital Status: Married    Spouse Name: Velda ShellHubert  . Number of Children: 4  . Years  of Education: M   Occupational History  . disabled    Social History Main Topics  . Smoking status: Former Smoker -- 1.00 packs/day for 20 years    Types: Cigarettes    Quit date: 07/31/2007  . Smokeless tobacco: Never Used  . Alcohol Use: No  . Drug Use: No  . Sexual Activity: Yes    Birth Control/ Protection: Surgical   Other Topics Concern  . None   Social History Narrative   Lives at home with husband Velda ShellHubert   Drinks 4 sodas a day   Past Surgical History  Procedure Laterality Date  . Back surgery  1997, 2003  . Abdominal hysterectomy    . Cystectomy    . Diagnostic laparoscopy    . Wrist arthroplasty Left   . Wrist arthroscopy with debridement Right 11/13/2015    Procedure: RIGHT WRIST ARTHROSCOPY WITH DEBRIDEMENT;  Surgeon: Betha LoaKevin Kuzma, MD;  Location: [facility];  Service: Orthopedics;  Laterality: Right;   Past Medical History  Diagnosis Date  . Fibromyalgia   . Anxiety   . Depression   . Hyperlipidemia   . Asthma   . Degenerative joint disease   . Osteoarthritis   . Belchings   . Lumbar post-laminectomy syndrome   . Lumbosacral neuritis   .  Disturbance of skin sensation   . Sciatica   . Chronic pain syndrome   . Other chronic postoperative pain   . GERD (gastroesophageal reflux disease)    BP 121/82 mmHg  Pulse 80  SpO2 96%  Opioid Risk Score:   Fall Risk Score:  `1  Depression screen PHQ 2/9  Depression screen [facility] 2/9 02/16/2016 10/20/2015 05/22/2015 12/02/2014  Decreased Interest 0 Down, Depressed, Hopeless 0 PHQ - 2 Score 0 Altered sleeping - - - 1  Tired, decreased energy - - - 1  Change in appetite - - - 0  Feeling bad or failure about yourself  - - - 0  Trouble concentrating - - - 1  Moving slowly or fidgety/restless - - - 0  Suicidal thoughts - - - 0  PHQ-9 Score - - - 5     Review of Systems  Constitutional: Negative.   HENT: Negative.   Eyes: Negative.   Respiratory: Negative.     Cardiovascular: Negative.   Gastrointestinal: Negative.   Endocrine: Negative.   Genitourinary: Negative.   Musculoskeletal: Positive for myalgias, back pain, arthralgias and neck pain.  Skin: Negative.   Allergic/Immunologic: Negative.   Neurological: Positive for dizziness.  Psychiatric/Behavioral: Positive for confusion and dysphoric mood. The patient is nervous/anxious.   All other systems reviewed and are negative.      Objective:   Physical Exam  Constitutional: She is oriented to person, place, and time. She appears well-developed and well-nourished.  HENT:  Head: Normocephalic and atraumatic.  Neck: Normal range of motion. Neck supple.  Cervical Paraspinal Tenderness: C-5- C-6  Cardiovascular: Normal rate and regular rhythm.   Pulmonary/Chest: Effort normal and breath sounds normal.  Musculoskeletal:  Normal Muscle Bulk and Muscle Testing Reveals: Upper Extremities: Decreased ROM 45 Degrees and Muscle Strength 4/5 Lumbar Paraspinal Tenderness: L-3- L-5 Lower Extremities: Full ROM and Muscle Strength 5/5 Arises from table with ease Narrow Based Gait  Neurological: She is alert and oriented to person, place, and time.  Skin: Skin is warm.  Psychiatric: She has a normal mood and affect.  Nursing note and vitals reviewed.         Assessment & Plan:  1.Lumbar postlaminectomy syndrome status post L5-S1 fusion radiating to RUE:[id number]: MS contin 30 mg # 90 pills--use one pill every 8 hours for pain and Continue Tramadol 50 mg BID. #60. We will continue the opioid monitoring program, this consists of regular clinic visits, examinations, urine drug screen, pill counts as well as use of [place] Controlled Substance reporting System. 2. Depression: Continue Current Medication Regimen of Prozac, Latuda and Wellbutrin. PCP following 3. Tremors: No complaints: F/U Neurology: Dr. Oswaldo Conroy 4. Muscle Spasm: Continue Tizanidine  20 minutes of face to face patient care  time was spent during this visit. All questions were encouraged and answered

## 2016-03-15 NOTE — Progress Notes (Signed)
Subjective:    Patient ID: Cindy Pratt, female    DOB: [birth year], 50 y.o.   MRN: [id number]  HPI  Pain Inventory Average Pain 9 Pain Right Now 10 My pain is intermittent, constant, sharp, burning, dull, stabbing, tingling and aching  In the last 24 hours, has pain interfered with the following? General activity 0 Relation with others 0 Enjoyment of life 0 What TIME of day is your pain at its worst? na Sleep (in general) NA  Pain is worse with: some activites Pain improves with: medication Relief from Meds: na  Mobility Do you have any goals in this area?  no  Function Do you have any goals in this area?  no  Neuro/Psych dizziness confusion depression anxiety  Prior Studies Any changes since last visit?  no  Physicians involved in your care Any changes since last visit?  no   Family History  Problem Relation Age of Onset  . Hypertension Mother   . Hypertension Father   . Breast cancer Maternal Grandmother   . Liver cancer Maternal Grandmother   . Cancer Maternal Grandmother     breast cancer  . Ovarian cancer Cousin   . Cancer Cousin     1st c. maternal side/ cervical cancer  . Diabetes Maternal Grandfather     both sides  . Cancer Cousin     1st c maternal/ colon cancer   Social History   Social History  . Marital Status: Married    Spouse Name: Velda ShellHubert  . Number of Children: 4  . Years of Education: M   Occupational History  . disabled    Social History Main Topics  . Smoking status: Former Smoker -- 1.00 packs/day for 20 years    Types: Cigarettes    Quit date: 07/31/2007  . Smokeless tobacco: Never Used  . Alcohol Use: No  . Drug Use: No  . Sexual Activity: Yes    Birth Control/ Protection: Surgical   Other Topics Concern  . None   Social History Narrative   Lives at home with husband Velda ShellHubert   Drinks 4 sodas a day   Past Surgical History  Procedure Laterality Date  . Back surgery  1997, 2003  . Abdominal hysterectomy      . Cystectomy    . Diagnostic laparoscopy    . Wrist arthroplasty Left   . Wrist arthroscopy with debridement Right 11/13/2015    Procedure: RIGHT WRIST ARTHROSCOPY WITH DEBRIDEMENT;  Surgeon: Betha LoaKevin Kuzma, MD;  Location: [facility];  Service: Orthopedics;  Laterality: Right;   Past Medical History  Diagnosis Date  . Fibromyalgia   . Anxiety   . Depression   . Hyperlipidemia   . Asthma   . Degenerative joint disease   . Osteoarthritis   . Belchings   . Lumbar post-laminectomy syndrome   . Lumbosacral neuritis   . Disturbance of skin sensation   . Sciatica   . Chronic pain syndrome   . Other chronic postoperative pain   . GERD (gastroesophageal reflux disease)    BP 121/82 mmHg  Pulse 80  SpO2 96%  Opioid Risk Score:   Fall Risk Score:  `1  Depression screen PHQ 2/9  Dep[facility] screen [facility] 02/16/2016 10/20/2015 05/22/2015 12/02/2014  Decreased Interest 0 1 1 1   Down, Depressed, Hopeless 0 1 1 1   PHQ - 2 Score 0 2 2 2   Altered sleeping - - - 1  Tired, decreased energy - - - 1  Change in appetite - - - 0  Feeling bad or failure about yourself  - - - 0  Trouble concentrating - - - 1  Moving slowly or fidgety/restless - - - 0  Suicidal thoughts - - - 0  PHQ-9 Score - - - 5     Review of Systems  All other systems reviewed and are negative.      Objective:   Physical Exam        Assessment & Plan:

## 2016-03-17 ENCOUNTER — Telehealth: Payer: Self-pay | Admitting: Registered Nurse

## 2016-03-17 NOTE — Telephone Encounter (Signed)
Placed a call to Ms. Hepburn, to review X-ray, no answer left message to return the call.

## 2016-03-23 LAB — 6-ACETYLMORPHINE,TOXASSURE ADD
6-ACETYLMORPHINE: NEGATIVE
6-acetylmorphine: NOT DETECTED ng/mg creat

## 2016-03-23 LAB — TOXASSURE SELECT,+ANTIDEPR,UR: PDF: 0

## 2016-03-29 NOTE — Progress Notes (Signed)
Urine drug screen for this encounter is consistent for prescribed medications.   

## 2016-04-14 ENCOUNTER — Telehealth: Payer: Self-pay

## 2016-04-14 NOTE — Telephone Encounter (Signed)
Pt had to cancel her appointment tomorrow with [facility]. Her father had a heart attack and is having open heart surgery. She will be out of meds by tomorrow. Pt is requesting a refill and would like her husband, Siri ColeHerbert, to pick up rx tomorrow morning. Please advise on refill.Marland Kitchen..Marland Kitchen

## 2016-04-15 ENCOUNTER — Encounter: Payer: Medicare Other | Admitting: Registered Nurse

## 2016-04-15 MED ORDER — MORPHINE SULFATE ER 30 MG PO TBCR: 30.0000 mg | EXTENDED_RELEASE_TABLET | Freq: Three times a day (TID) | ORAL | 0 refills | Status: DC

## 2016-04-15 NOTE — Telephone Encounter (Signed)
Return Ms. Shindler call, no answer. Left message to return the call. MS Contin last picked up [facility] 03/19/16, [facility] . She needs to make an appointment.

## 2016-05-14 ENCOUNTER — Ambulatory Visit: Payer: Medicare Other | Admitting: Physical Medicine & Rehabilitation

## 2016-05-17 ENCOUNTER — Encounter: Payer: Medicare Other | Attending: Registered Nurse

## 2016-05-17 ENCOUNTER — Ambulatory Visit: Payer: Medicare Other | Admitting: Physical Medicine & Rehabilitation

## 2016-05-17 ENCOUNTER — Encounter: Payer: Self-pay | Admitting: Physical Medicine & Rehabilitation

## 2016-05-17 ENCOUNTER — Ambulatory Visit (HOSPITAL_BASED_OUTPATIENT_CLINIC_OR_DEPARTMENT_OTHER): Payer: Medicare Other | Admitting: Physical Medicine & Rehabilitation

## 2016-05-17 VITALS — BP 113/78 | HR 74 | Resp 14

## 2016-05-17 DIAGNOSIS — J45909 Unspecified asthma, uncomplicated: Secondary | ICD-10-CM | POA: Insufficient documentation

## 2016-05-17 DIAGNOSIS — Z87891 Personal history of nicotine dependence: Secondary | ICD-10-CM | POA: Insufficient documentation

## 2016-05-17 DIAGNOSIS — E785 Hyperlipidemia, unspecified: Secondary | ICD-10-CM | POA: Diagnosis not present

## 2016-05-17 DIAGNOSIS — G629 Polyneuropathy, unspecified: Secondary | ICD-10-CM | POA: Insufficient documentation

## 2016-05-17 DIAGNOSIS — G894 Chronic pain syndrome: Secondary | ICD-10-CM

## 2016-05-17 DIAGNOSIS — F329 Major depressive disorder, single episode, unspecified: Secondary | ICD-10-CM | POA: Insufficient documentation

## 2016-05-17 DIAGNOSIS — M797 Fibromyalgia: Secondary | ICD-10-CM | POA: Diagnosis not present

## 2016-05-17 DIAGNOSIS — M961 Postlaminectomy syndrome, not elsewhere classified: Secondary | ICD-10-CM

## 2016-05-17 DIAGNOSIS — M1990 Unspecified osteoarthritis, unspecified site: Secondary | ICD-10-CM | POA: Insufficient documentation

## 2016-05-17 DIAGNOSIS — Z981 Arthrodesis status: Secondary | ICD-10-CM | POA: Insufficient documentation

## 2016-05-17 DIAGNOSIS — M533 Sacrococcygeal disorders, not elsewhere classified: Secondary | ICD-10-CM

## 2016-05-17 DIAGNOSIS — G8929 Other chronic pain: Secondary | ICD-10-CM | POA: Diagnosis present

## 2016-05-17 MED ORDER — TIZANIDINE HCL 4 MG PO TABS: 4.0000 mg | ORAL_TABLET | Freq: Two times a day (BID) | ORAL | 5 refills | Status: DC

## 2016-05-17 MED ORDER — TRAMADOL HCL 50 MG PO TABS: 50.0000 mg | ORAL_TABLET | Freq: Two times a day (BID) | ORAL | 2 refills | Status: DC | PRN

## 2016-05-17 MED ORDER — MORPHINE SULFATE ER 30 MG PO TBCR: 30.0000 mg | EXTENDED_RELEASE_TABLET | Freq: Three times a day (TID) | ORAL | 0 refills | Status: DC

## 2016-05-17 NOTE — Progress Notes (Signed)
Subjective:    Patient ID: Cindy Pratt, female    DOB: 08/25/1966, 50 y.o.   MRN: [id number]  HPI 50 year old female with history of lumbar fusion. Her primary pain complaint is low back pain. She does have some neck pain as well but this is not severe. In addition, she has fibromyalgia syndrome  No further falls. Father has been sick and hospitalized for over a month at [facility]. Patient is very stressed out about this and has not been sleeping well. She has recovered nicely from her carpal tunnel surgery  Pain Inventory Average Pain 9 Pain Right Now 9 My pain is intermittent, constant, sharp, burning, dull, stabbing, tingling and aching  In the last 24 hours, has pain interfered with the following? General activity 8 Relation with others 10 Enjoyment of life 10 What TIME of day is your pain at its worst? all Sleep (in general) Poor  Pain is worse with: walking, bending, sitting, inactivity, standing and some activites Pain improves with: medication Relief from Meds: 3  Mobility walk without assistance  Function Do you have any goals in this area?  no  Neuro/Psych dizziness confusion depression anxiety  Prior Studies Any changes since last visit?  no  Physicians involved in your care Any changes since last visit?  no   Family History  Problem Relation Age of Onset  . Hypertension Mother   . Hypertension Father   . Breast cancer Maternal Grandmother   . Liver cancer Maternal Grandmother   . Cancer Maternal Grandmother     breast cancer  . Ovarian cancer Cousin   . Cancer Cousin     1st c. maternal side/ cervical cancer  . Cancer Cousin     1st c maternal/ colon cancer  . Diabetes Maternal Grandfather     both sides   Social History   Social History  . Marital status: Married    Spouse name: [name]  . Number of children: 4  . Years of education: M   Occupational History  . disabled Disable   Social History Main Topics  . Smoking status: Former  Smoker    Packs/day: 1.00    Years: 20.00    Types: Cigarettes    Quit date: 07/31/2007  . Smokeless tobacco: Never Used  . Alcohol use No  . Drug use: No  . Sexual activity: Yes    Birth control/ protection: Surgical   Other Topics Concern  . None   Social History Narrative   Lives at home with husband [name]   Drinks 4 sodas a day   Past Surgical History:  Procedure Laterality Date  . ABDOMINAL HYSTERECTOMY    . BACK SURGERY  1997, 2003  . CYSTECTOMY    . DIAGNOSTIC LAPAROSCOPY    . WRIST ARTHROPLASTY Left   . WRIST ARTHROSCOPY WITH DEBRIDEMENT Right 11/13/2015   Procedure: RIGHT WRIST ARTHROSCOPY WITH DEBRIDEMENT;  Surgeon: Betha Loa, MD;  Location: [facility];  Service: Orthopedics;  Laterality: Right;   Past Medical History:  Diagnosis Date  . Anxiety   . Asthma   . Belchings   . Chronic pain syndrome   . Degenerative joint disease   . Depression   . Disturbance of skin sensation   . Fibromyalgia   . GERD (gastroesophageal reflux disease)   . Hyperlipidemia   . Lumbar post-laminectomy syndrome   . Lumbosacral neuritis   . Osteoarthritis   . Other chronic postoperative pain   . Sciatica    BP  113/78 (BP Location: Right Arm, Patient Position: Sitting, Cuff Size: Large)   Pulse 74   Resp 14   SpO2 95%   Opioid Risk Score:   Fall Risk Score:  `1  Depression screen PHQ 2/9  Dep[facility] 02/16/2016 10/20/2015 05/22/2015 12/02/2014  Decreased Interest 0 1 1 1   Down, Depressed, Hopeless 0 1 1 1   PHQ - 2 Score 0 2 2 2   Altered sleeping - - - 1  Tired, decreased energy - - - 1  Change in appetite - - - 0  Feeling bad or failure about yourself  - - - 0  Trouble concentrating - - - 1  Moving slowly or fidgety/restless - - - 0  Suicidal thoughts - - - 0  PHQ-9 Score - - - 5    Review of Systems  Constitutional: Negative.   HENT: Negative.   Eyes: Negative.   Respiratory: Negative.   Cardiovascular: Negative.   Endocrine:  Negative.   Genitourinary: Negative.   Musculoskeletal: Positive for arthralgias, back pain, myalgias and neck pain.  Allergic/Immunologic: Negative.   Neurological: Positive for dizziness.  Psychiatric/Behavioral: Positive for confusion and dysphoric mood. The patient is nervous/anxious.   All other systems reviewed and are negative.      Objective:   Physical Exam  Constitutional: She is oriented to person, place, and time. She appears well-developed and well-nourished.  HENT:  Head: Normocephalic and atraumatic.  Eyes: Conjunctivae and EOM are normal. Pupils are equal, round, and reactive to light.  Musculoskeletal: Normal range of motion. She exhibits tenderness. She exhibits no deformity.  Decreased range of motion lumbar flexion, extension, lateral bending and rotation  Patient has normal hip internal/external rotation in a sitting position  No tenderness over the trochanteric bursa area  Neurological: She is alert and oriented to person, place, and time.  Psychiatric: She has a normal mood and affect.  Nursing note and vitals reviewed.  Tenderness to palpation bilateral PSIS No pain above L4 Negative straight leg raising test. Faber's limited due to pain in the low back area bilaterally. Motor strength is 5/5 bilateral deltoid, biceps, triceps, grip, hip flexor, knee extensor, ankle dorsi flexion, plantar flexor.        Assessment & Plan:  1. Lumbar postlaminectomy syndrome, history of L5-S1 fusion, last MRI 2014 showed no significant adjacent level degeneration. Her increased pain is below the level of the fusion. 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 Exam is consistent with this. We discussed treatment options including sacroiliac injection as a diagnostic/therapeutic procedure. She would first like to wait until her father's illness subsides before she considers this. Continue MS Contin 30 mg 3 times a day. Continue opioid monitoring program. This consists of  regular clinic visits, examinations, urine drug screen, pill counts as well as use of [place] controlled substance reporting System.  Continue tramadol 50 mg twice a day, no sign of serotonin syndrome is taking SSRI as well  Continue tizanidine 4 mg twice a day  Nurse practitioner. Visit 1 month  If patient wishes to pursue sacroiliac injection, we'll schedule

## 2016-05-17 NOTE — Patient Instructions (Addendum)
Sacroiliac injection was performed today. A combination of a naming medicine plus a cortisone medicine was injected. The injection was done under x-ray guidance. This procedure has been performed to help reduce low back and buttocks pain as well as potentially hip pain. The duration of this injection is variable lasting from hours to  Months. It may repeated if needed. 

## 2016-06-14 ENCOUNTER — Encounter: Payer: Self-pay | Admitting: Registered Nurse

## 2016-06-14 ENCOUNTER — Encounter: Payer: Medicare Other | Attending: Registered Nurse | Admitting: Registered Nurse

## 2016-06-14 VITALS — BP 100/69 | HR 79

## 2016-06-14 DIAGNOSIS — M1990 Unspecified osteoarthritis, unspecified site: Secondary | ICD-10-CM | POA: Insufficient documentation

## 2016-06-14 DIAGNOSIS — Z79899 Other long term (current) drug therapy: Secondary | ICD-10-CM

## 2016-06-14 DIAGNOSIS — E785 Hyperlipidemia, unspecified: Secondary | ICD-10-CM | POA: Insufficient documentation

## 2016-06-14 DIAGNOSIS — J45909 Unspecified asthma, uncomplicated: Secondary | ICD-10-CM | POA: Insufficient documentation

## 2016-06-14 DIAGNOSIS — M961 Postlaminectomy syndrome, not elsewhere classified: Secondary | ICD-10-CM | POA: Insufficient documentation

## 2016-06-14 DIAGNOSIS — G894 Chronic pain syndrome: Secondary | ICD-10-CM | POA: Diagnosis not present

## 2016-06-14 DIAGNOSIS — F329 Major depressive disorder, single episode, unspecified: Secondary | ICD-10-CM | POA: Insufficient documentation

## 2016-06-14 DIAGNOSIS — M533 Sacrococcygeal disorders, not elsewhere classified: Secondary | ICD-10-CM | POA: Diagnosis not present

## 2016-06-14 DIAGNOSIS — G8929 Other chronic pain: Secondary | ICD-10-CM | POA: Diagnosis present

## 2016-06-14 DIAGNOSIS — Z981 Arthrodesis status: Secondary | ICD-10-CM | POA: Insufficient documentation

## 2016-06-14 DIAGNOSIS — M797 Fibromyalgia: Secondary | ICD-10-CM | POA: Diagnosis not present

## 2016-06-14 DIAGNOSIS — Z87891 Personal history of nicotine dependence: Secondary | ICD-10-CM | POA: Insufficient documentation

## 2016-06-14 DIAGNOSIS — G629 Polyneuropathy, unspecified: Secondary | ICD-10-CM | POA: Insufficient documentation

## 2016-06-14 DIAGNOSIS — Z5181 Encounter for therapeutic drug level monitoring: Secondary | ICD-10-CM

## 2016-06-14 MED ORDER — MORPHINE SULFATE ER 30 MG PO TBCR: 30.0000 mg | EXTENDED_RELEASE_TABLET | Freq: Three times a day (TID) | ORAL | 0 refills | Status: DC

## 2016-06-14 NOTE — Progress Notes (Signed)
Subjective:    Patient ID: Cindy Pratt, female    DOB: 06/03/1966, 50 y.o.   MRN: [id number]  HPI:  [name] is a 50 year old female who returns for follow up for chronic pain and medication refill. She states she's having generalized pain all over today.  She rates her pain 7. Her current exercise regime is walking in the home.  We discussed the sacroiliac injection, she would like to think about it, and call office when she is ready. Her father passed away last month emotional support given.     Pain Inventory Average Pain 8 Pain Right Now 7 My pain is sharp, burning, dull, stabbing, tingling and aching  In the last 24 hours, has pain interfered with the following? General activity 7 Relation with others 0 Enjoyment of life 0 What TIME of day is your pain at its worst? all times Sleep (in general) Fair  Pain is worse with: walking, bending, sitting, inactivity, standing and some activites Pain improves with: therapy/exercise and medication Relief from Meds: 5  Mobility Do you have any goals in this area?  no  Function Do you have any goals in this area?  no  Neuro/Psych weakness numbness tremor tingling dizziness confusion depression anxiety  Prior Studies Any changes since last visit?  no  Physicians involved in your care Any changes since last visit?  no   Family History  Problem Relation Age of Onset  . Hypertension Mother   . Hypertension Father   . Breast cancer Maternal Grandmother   . Liver cancer Maternal Grandmother   . Cancer Maternal Grandmother     breast cancer  . Ovarian cancer Cousin   . Cancer Cousin     1st c. maternal side/ cervical cancer  . Cancer Cousin     1st c maternal/ colon cancer  . Diabetes Maternal Grandfather     both sides   Social History   Social History  . Marital status: Married    Spouse name: Cindy Pratt  . Number of children: 4  . Years of education: M   Occupational History  . disabled Disable     Social History Main Topics  . Smoking status: Former Smoker    Packs/day: 1.00    Years: 20.00    Types: Cigarettes    Quit date: 07/31/2007  . Smokeless tobacco: Never Used  . Alcohol use No  . Drug use: No  . Sexual activity: Yes    Birth control/ protection: Surgical   Other Topics Concern  . None   Social History Narrative   Lives at home with husband Cindy Pratt   Drinks 4 sodas a day   Past Surgical History:  Procedure Laterality Date  . ABDOMINAL HYSTERECTOMY    . BACK SURGERY  1997, 2003  . CYSTECTOMY    . DIAGNOSTIC LAPAROSCOPY    . WRIST ARTHROPLASTY Left   . WRIST ARTHROSCOPY WITH DEBRIDEMENT Right 11/13/2015   Procedure: RIGHT WRIST ARTHROSCOPY WITH DEBRIDEMENT;  Surgeon: Betha Loa, MD;  Location: [facility];  Service: Orthopedics;  Laterality: Right;   Past Medical History:  Diagnosis Date  . Anxiety   . Asthma   . Belchings   . Chronic pain syndrome   . Degenerative joint disease   . Depression   . Disturbance of skin sensation   . Fibromyalgia   . GERD (gastroesophageal reflux disease)   . Hyperlipidemia   . Lumbar post-laminectomy syndrome   . Lumbosacral neuritis   .  Osteoarthritis   . Other chronic postoperative pain   . Sciatica    BP 100/69   Pulse 79   SpO2 95%   Opioid Risk Score:   Fall Risk Score:  `1  Depression screen PHQ 2/9  [facility] 02/16/2016 10/20/2015 05/22/2015 12/02/2014  Decreased Interest 0 1 1 1   Down, Depressed, Hopeless 0 1 1 1   PHQ - 2 Score 0 2 2 2   Altered sleeping - - - 1  Tired, decreased energy - - - 1  Change in appetite - - - 0  Feeling bad or failure about yourself  - - - 0  Trouble concentrating - - - 1  Moving slowly or fidgety/restless - - - 0  Suicidal thoughts - - - 0  PHQ-9 Score - - - 5     Review of Systems  HENT: Negative.   Eyes: Negative.   Respiratory: Negative.   Cardiovascular: Negative.   Gastrointestinal: Negative.   Endocrine: Negative.    Genitourinary: Negative.   Musculoskeletal: Positive for back pain.  Skin: Negative.   Allergic/Immunologic: Negative.   Neurological: Positive for dizziness, tremors, weakness and numbness.  Hematological: Negative.   Psychiatric/Behavioral: Positive for confusion and dysphoric mood. The patient is nervous/anxious.        Objective:   Physical Exam  Constitutional: She is oriented to person, place, and time. She appears well-developed and well-nourished.  HENT:  Head: Normocephalic and atraumatic.  Neck: Normal range of motion. Neck supple.  Cervical Paraspinal Tenderness: C-5- C-6   Cardiovascular: Normal rate and regular rhythm.   Pulmonary/Chest: Effort normal and breath sounds normal.  Musculoskeletal:  Normal Muscle Bulk and Muscle Testing Reveals: Upper Extremities: Decreased ROM  90 Degrees and Muscle Strength 5/5 Thoracic Paraspinal Tenderness: T-7- T-9 Lumbar Paraspinal Tenderness: L-3- L-5 Lower Extremities: Full ROM and Muscle Strength 5/5 Arises from table with ease Narrow Based Gait     Neurological: She is alert and oriented to person, place, and time.  Skin: Skin is warm and dry.  Psychiatric: She has a normal mood and affect.  Nursing note and vitals reviewed.         Assessment & Plan:  1.Lumbar postlaminectomy syndrome status post L5-S1 fusion radiating to ZOX:WRUEAVWULLE:Refilled: MS contin 30 mg # 90 pills--use one pill every 8 hours for pain and Continue Tramadol 50 mg BID. #60. We will continue the opioid monitoring program, this consists of regular clinic visits, examinations, urine drug screen, pill counts as well as use of [place]  Controlled Substance reporting System. 2. Depression: Continue Current Medication Regimen of Prozac, Latuda and Wellbutrin. PCP following 3. Tremors: No complaints: F/U Neurology: Dr. Oswaldo ConroyPenamali 4. Muscle Spasm: Continue Tizanidine  20 minutes of face to face patient care time was spent during this visit. All questions  were encouraged and answered

## 2016-07-12 ENCOUNTER — Encounter: Payer: Medicare Other | Admitting: Registered Nurse

## 2016-07-13 ENCOUNTER — Ambulatory Visit (HOSPITAL_BASED_OUTPATIENT_CLINIC_OR_DEPARTMENT_OTHER): Payer: Medicare Other | Admitting: Physical Medicine & Rehabilitation

## 2016-07-13 ENCOUNTER — Encounter: Payer: Medicare Other | Attending: Registered Nurse

## 2016-07-13 ENCOUNTER — Encounter: Payer: Self-pay | Admitting: Physical Medicine & Rehabilitation

## 2016-07-13 VITALS — BP 110/77 | HR 62 | Resp 14

## 2016-07-13 DIAGNOSIS — G894 Chronic pain syndrome: Secondary | ICD-10-CM

## 2016-07-13 DIAGNOSIS — F329 Major depressive disorder, single episode, unspecified: Secondary | ICD-10-CM | POA: Diagnosis not present

## 2016-07-13 DIAGNOSIS — G629 Polyneuropathy, unspecified: Secondary | ICD-10-CM | POA: Diagnosis not present

## 2016-07-13 DIAGNOSIS — G8929 Other chronic pain: Secondary | ICD-10-CM | POA: Diagnosis present

## 2016-07-13 DIAGNOSIS — Z981 Arthrodesis status: Secondary | ICD-10-CM | POA: Diagnosis not present

## 2016-07-13 DIAGNOSIS — Z79899 Other long term (current) drug therapy: Secondary | ICD-10-CM

## 2016-07-13 DIAGNOSIS — M961 Postlaminectomy syndrome, not elsewhere classified: Secondary | ICD-10-CM

## 2016-07-13 DIAGNOSIS — Z87891 Personal history of nicotine dependence: Secondary | ICD-10-CM | POA: Diagnosis not present

## 2016-07-13 DIAGNOSIS — J45909 Unspecified asthma, uncomplicated: Secondary | ICD-10-CM | POA: Insufficient documentation

## 2016-07-13 DIAGNOSIS — M797 Fibromyalgia: Secondary | ICD-10-CM | POA: Diagnosis not present

## 2016-07-13 DIAGNOSIS — M1990 Unspecified osteoarthritis, unspecified site: Secondary | ICD-10-CM | POA: Insufficient documentation

## 2016-07-13 DIAGNOSIS — E785 Hyperlipidemia, unspecified: Secondary | ICD-10-CM | POA: Insufficient documentation

## 2016-07-13 DIAGNOSIS — Z5181 Encounter for therapeutic drug level monitoring: Secondary | ICD-10-CM

## 2016-07-13 DIAGNOSIS — M533 Sacrococcygeal disorders, not elsewhere classified: Secondary | ICD-10-CM

## 2016-07-13 MED ORDER — MORPHINE SULFATE ER 30 MG PO TBCR: 30.0000 mg | EXTENDED_RELEASE_TABLET | Freq: Three times a day (TID) | ORAL | 0 refills | Status: DC

## 2016-07-13 NOTE — Progress Notes (Signed)

## 2016-07-13 NOTE — Patient Instructions (Signed)
Sacroiliac injection was performed today. A combination of a naming medicine plus a cortisone medicine was injected. The injection was done under x-ray guidance. This procedure has been performed to help reduce low back and buttocks pain as well as potentially hip pain. The duration of this injection is variable lasting from hours to  Months. It may repeated if needed. 

## 2016-07-13 NOTE — Progress Notes (Signed)
  PROCEDURE RECORD [name]   Name: Cindy JubileeLori A Bundrick DOB:Aug 06, 1966 MRN: [id number]  Date:07/13/2016  Physician: Claudette LawsAndrew Kirsteins, MD    Nurse/CMA: Zaharah Amir, CMA  Allergies:  Allergies  Allergen Reactions  . Aspirin Shortness Of Breath  . Nsaids Shortness Of Breath  . Sulfa Antibiotics Other (See Comments) and Hives  . Sulfonamide Derivatives Hives  . Tolmetin Shortness Of Breath  . Cymbalta [Duloxetine Hcl] Other (See Comments)    confusion  . Duloxetine Other (See Comments)    confusion  . Gabapentin Other (See Comments)    Causes confusion  . Other     Aswanghda? Patient is not sure of spelling it is an herb  . Pregabalin     REACTION: confusion    Consent Signed: Yes.    Is patient diabetic? No.  CBG today? n/a  Pregnant: No. LMP: No LMP recorded. Patient has had a hysterectomy. (age 51-55)  Anticoagulants: no Anti-inflammatory: no Antibiotics: no  Procedure: sacroiliac steroid injection  Position: Prone Start Time: 3:04pm  End Time: 3:10pm  Fluoro Time: 18  RN/CMA Jenesa Foresta, CMA Penina Reisner, CMA    Time 2:45pm 3:15pm    BP 110/77 107/73    Pulse 62 63    Respirations 14 14    O2 Sat 95 98    S/S 6 6    Pain Level 6/10 4/10     D/C home with husband, patient A & O X 3, D/C instructions reviewed, and sits independently.

## 2016-07-13 NOTE — Addendum Note (Signed)
Addended by: Erick ColaceKIRSTEINS, Esthefany Herrig E on: 07/13/2016 03:18 PM   Modules accepted: Orders

## 2016-07-20 LAB — 6-ACETYLMORPHINE,TOXASSURE ADD
6-ACETYLMORPHINE: NEGATIVE
6-ACETYLMORPHINE: NOT DETECTED ng/mg{creat}

## 2016-07-20 LAB — TOXASSURE SELECT,+ANTIDEPR,UR

## 2016-08-06 ENCOUNTER — Telehealth: Payer: Self-pay | Admitting: Registered Nurse
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
## 2016-08-10 ENCOUNTER — Encounter: Payer: Self-pay | Admitting: Registered Nurse

## 2016-08-10 ENCOUNTER — Encounter: Payer: Medicare Other | Attending: Registered Nurse | Admitting: Registered Nurse

## 2016-08-10 VITALS — BP 100/64 | HR 54 | Resp 14

## 2016-08-10 DIAGNOSIS — Z5181 Encounter for therapeutic drug level monitoring: Secondary | ICD-10-CM

## 2016-08-10 DIAGNOSIS — E785 Hyperlipidemia, unspecified: Secondary | ICD-10-CM | POA: Insufficient documentation

## 2016-08-10 DIAGNOSIS — Z87891 Personal history of nicotine dependence: Secondary | ICD-10-CM | POA: Diagnosis not present

## 2016-08-10 DIAGNOSIS — G8929 Other chronic pain: Secondary | ICD-10-CM | POA: Diagnosis present

## 2016-08-10 DIAGNOSIS — M542 Cervicalgia: Secondary | ICD-10-CM | POA: Diagnosis not present

## 2016-08-10 DIAGNOSIS — Z79899 Other long term (current) drug therapy: Secondary | ICD-10-CM

## 2016-08-10 DIAGNOSIS — M1990 Unspecified osteoarthritis, unspecified site: Secondary | ICD-10-CM | POA: Insufficient documentation

## 2016-08-10 DIAGNOSIS — M961 Postlaminectomy syndrome, not elsewhere classified: Secondary | ICD-10-CM | POA: Diagnosis not present

## 2016-08-10 DIAGNOSIS — M545 Low back pain, unspecified: Secondary | ICD-10-CM

## 2016-08-10 DIAGNOSIS — G894 Chronic pain syndrome: Secondary | ICD-10-CM

## 2016-08-10 DIAGNOSIS — Z981 Arthrodesis status: Secondary | ICD-10-CM | POA: Insufficient documentation

## 2016-08-10 DIAGNOSIS — G629 Polyneuropathy, unspecified: Secondary | ICD-10-CM | POA: Diagnosis not present

## 2016-08-10 DIAGNOSIS — F329 Major depressive disorder, single episode, unspecified: Secondary | ICD-10-CM | POA: Diagnosis not present

## 2016-08-10 DIAGNOSIS — M797 Fibromyalgia: Secondary | ICD-10-CM

## 2016-08-10 DIAGNOSIS — M5412 Radiculopathy, cervical region: Secondary | ICD-10-CM

## 2016-08-10 DIAGNOSIS — J45909 Unspecified asthma, uncomplicated: Secondary | ICD-10-CM | POA: Diagnosis not present

## 2016-08-10 DIAGNOSIS — R42 Dizziness and giddiness: Secondary | ICD-10-CM

## 2016-08-10 MED ORDER — TRAMADOL HCL 50 MG PO TABS: 50.0000 mg | ORAL_TABLET | Freq: Two times a day (BID) | ORAL | 2 refills | Status: DC | PRN

## 2016-08-10 MED ORDER — MORPHINE SULFATE ER 30 MG PO TBCR: 30.0000 mg | EXTENDED_RELEASE_TABLET | Freq: Three times a day (TID) | ORAL | 0 refills | Status: DC

## 2016-08-10 NOTE — Patient Instructions (Addendum)
Follow up with Neurology regarding Dizziness: [facility]:  Dr. Danae OrleansPenumali : [phone]
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
## 2016-09-07 ENCOUNTER — Encounter: Payer: Medicare Other | Attending: Registered Nurse | Admitting: Registered Nurse

## 2016-09-07 ENCOUNTER — Encounter: Payer: Self-pay | Admitting: Registered Nurse

## 2016-09-07 VITALS — BP 119/79 | HR 69

## 2016-09-07 DIAGNOSIS — Z5181 Encounter for therapeutic drug level monitoring: Secondary | ICD-10-CM

## 2016-09-07 DIAGNOSIS — Z981 Arthrodesis status: Secondary | ICD-10-CM | POA: Diagnosis not present

## 2016-09-07 DIAGNOSIS — Z79899 Other long term (current) drug therapy: Secondary | ICD-10-CM

## 2016-09-07 DIAGNOSIS — M542 Cervicalgia: Secondary | ICD-10-CM | POA: Diagnosis not present

## 2016-09-07 DIAGNOSIS — M1990 Unspecified osteoarthritis, unspecified site: Secondary | ICD-10-CM | POA: Insufficient documentation

## 2016-09-07 DIAGNOSIS — F329 Major depressive disorder, single episode, unspecified: Secondary | ICD-10-CM | POA: Insufficient documentation

## 2016-09-07 DIAGNOSIS — G629 Polyneuropathy, unspecified: Secondary | ICD-10-CM | POA: Insufficient documentation

## 2016-09-07 DIAGNOSIS — J45909 Unspecified asthma, uncomplicated: Secondary | ICD-10-CM | POA: Diagnosis not present

## 2016-09-07 DIAGNOSIS — E785 Hyperlipidemia, unspecified: Secondary | ICD-10-CM | POA: Diagnosis not present

## 2016-09-07 DIAGNOSIS — Z87891 Personal history of nicotine dependence: Secondary | ICD-10-CM | POA: Insufficient documentation

## 2016-09-07 DIAGNOSIS — M797 Fibromyalgia: Secondary | ICD-10-CM

## 2016-09-07 DIAGNOSIS — M545 Low back pain, unspecified: Secondary | ICD-10-CM

## 2016-09-07 DIAGNOSIS — G894 Chronic pain syndrome: Secondary | ICD-10-CM

## 2016-09-07 DIAGNOSIS — M961 Postlaminectomy syndrome, not elsewhere classified: Secondary | ICD-10-CM | POA: Insufficient documentation

## 2016-09-07 DIAGNOSIS — G8929 Other chronic pain: Secondary | ICD-10-CM | POA: Diagnosis present

## 2016-09-07 MED ORDER — MORPHINE SULFATE ER 30 MG PO TBCR: 30.0000 mg | EXTENDED_RELEASE_TABLET | Freq: Three times a day (TID) | ORAL | 0 refills | Status: DC

## 2016-09-07 NOTE — Progress Notes (Signed)
Subjective:    Patient ID: Cindy Pratt, female    DOB: June 21, 1966, 51 y.o.   MRN: [id number]  HPI: [name] is a 51 year old female who returns for follow up appointment for chronic pain and medication refill. She states her pain is located in her neck, lower back radiating into her left hip and generalized pain all over.She rates her pain 8. Her current exercise regime is walking in the home.  Pain Inventory Average Pain 7 Pain Right Now 8 My pain is sharp, burning, dull, stabbing, tingling and aching  In the last 24 hours, has pain interfered with the following? General activity 8 Relation with others 8 Enjoyment of life 8 What TIME of day is your pain at its worst? . Sleep (in general) Fair  Pain is worse with: all Pain improves with: medication Relief from Meds: 8  Mobility walk without assistance Do you have any goals in this area?  no  Function Do you have any goals in this area?  no  Neuro/Psych No problems in this area  Prior Studies Any changes since last visit?  no  Physicians involved in your care Any changes since last visit?  no   Family History  Problem Relation Age of Onset  . Hypertension Mother   . Hypertension Father   . Breast cancer Maternal Grandmother   . Liver cancer Maternal Grandmother   . Cancer Maternal Grandmother     breast cancer  . Ovarian cancer Cousin   . Cancer Cousin     1st c. maternal side/ cervical cancer  . Cancer Cousin     1st c maternal/ colon cancer  . Diabetes Maternal Grandfather     both sides   Social History   Social History  . Marital status: Married    Spouse name: Cindy Pratt  . Number of children: 4  . Years of education: M   Occupational History  . disabled Disable   Social History Main Topics  . Smoking status: Former Smoker    Packs/day: 1.00    Years: 20.00    Types: Cigarettes    Quit date: 07/31/2007  . Smokeless tobacco: Never Used  . Alcohol use No  . Drug use: No  .  Sexual activity: Yes    Birth control/ protection: Surgical   Other Topics Concern  . None   Social History Narrative   Lives at home with husband Cindy Pratt   Drinks 4 sodas a day   Past Surgical History:  Procedure Laterality Date  . ABDOMINAL HYSTERECTOMY    . BACK SURGERY  1997, 2003  . CYSTECTOMY    . DIAGNOSTIC LAPAROSCOPY    . WRIST ARTHROPLASTY Left   . WRIST ARTHROSCOPY WITH DEBRIDEMENT Right 11/13/2015   Procedure: RIGHT WRIST ARTHROSCOPY WITH DEBRIDEMENT;  Surgeon: Betha Loa, MD;  Location: [facility];  Service: Orthopedics;  Laterality: Right;   Past Medical History:  Diagnosis Date  . Anxiety   . Asthma   . Belchings   . Chronic pain syndrome   . Degenerative joint disease   . Depression   . Disturbance of skin sensation   . Fibromyalgia   . GERD (gastroesophageal reflux disease)   . Hyperlipidemia   . Lumbar post-laminectomy syndrome   . Lumbosacral neuritis   . Osteoarthritis   . Other chronic postoperative pain   . Sciatica    BP 119/79   Pulse 69   SpO2 93%   Opioid Risk Score:  Fall Risk Score:  `1  Depression screen PHQ 2/9  [facility] 02/16/2016 10/20/2015 05/22/2015 12/02/2014  Decreased Interest 0 1 1 1   Down, Depressed, Hopeless 0 1 1 1   PHQ - 2 Score 0 2 2 2   Altered sleeping - - - 1  Tired, decreased energy - - - 1  Change in appetite - - - 0  Feeling bad or failure about yourself  - - - 0  Trouble concentrating - - - 1  Moving slowly or fidgety/restless - - - 0  Suicidal thoughts - - - 0  PHQ-9 Score - - - 5   Review of Systems  Constitutional: Negative.   HENT: Negative.   Eyes: Negative.   Respiratory: Negative.   Cardiovascular: Negative.   Gastrointestinal: Negative.   Endocrine: Negative.   Genitourinary: Negative.   Musculoskeletal: Positive for back pain.  Skin: Negative.   Neurological: Negative.   Hematological: Negative.   Psychiatric/Behavioral: Negative.   All other systems reviewed  and are negative.      Objective:   Physical Exam  Constitutional: She is oriented to person, place, and time. She appears well-developed and well-nourished.  HENT:  Head: Normocephalic and atraumatic.  Neck: Normal range of motion. Neck supple.  Cervical Paraspinal Tenderness: C-5-C-6  Cardiovascular: Normal rate and regular rhythm.   Pulmonary/Chest: Effort normal and breath sounds normal.  Musculoskeletal:  Normal Muscle Bulk and Muscle Testing Reveals: Upper Extremities: Full ROM and Muscle Strength 5/5 Lumbar Paraspinal Tenderness: L-3-L-5 Lower Extremities: Full ROM and Muscle Strength 5/5 Bilateral Lower Extremity Flexion Produces Pain into Lumbar Arises from Table with ease Narrow Based Gait  Neurological: She is alert and oriented to person, place, and time.  Skin: Skin is warm and dry.  Psychiatric: She has a normal mood and affect.  Nursing note and vitals reviewed.         Assessment & Plan:  1.Lumbar postlaminectomy syndrome status post L5-S1 fusion radiating to VHQ:[id number]:Refilled: MS contin 30 mg # 90 pills--use one pill every 8 hours for pain and Tramadol 50 mg BID. #60. We will continue the opioid monitoring program, this consists of regular clinic visits, examinations, urine drug screen, pill counts as well as use of [place] Controlled Substance reporting System. 2. Depression: Continue Current Medication Regimen of Prozac, Latuda and Wellbutrin. PCP following 3. Tremors: No complaints: F/U Neurology: Dr. Oswaldo ConroyPenamali 4. Muscle Spasm: Continue Tizanidine  20 minutes of face to face patient care time was spent during this visit. All questions were encouraged and answered

## 2016-09-16 ENCOUNTER — Telehealth: Payer: Self-pay | Admitting: Registered Nurse

## 2016-09-16 NOTE — Telephone Encounter (Signed)
On January 18,2018 NCCSR was reviewed: No conflict was seen [place] Cont[place]stance Reporting System with Multiple Prescribers. Ms. Legrand Pittsearsonhas a signed Narcotic Contract with our office. If there were any discrepancies this would have beenreported to her Physcian.

## 2016-10-06 ENCOUNTER — Encounter: Payer: Medicare Other | Attending: Registered Nurse | Admitting: Registered Nurse

## 2016-10-06 ENCOUNTER — Encounter: Payer: Self-pay | Admitting: Registered Nurse

## 2016-10-06 VITALS — BP 115/77 | HR 69

## 2016-10-06 DIAGNOSIS — Z79899 Other long term (current) drug therapy: Secondary | ICD-10-CM

## 2016-10-06 DIAGNOSIS — M1990 Unspecified osteoarthritis, unspecified site: Secondary | ICD-10-CM | POA: Insufficient documentation

## 2016-10-06 DIAGNOSIS — M545 Low back pain, unspecified: Secondary | ICD-10-CM

## 2016-10-06 DIAGNOSIS — J45909 Unspecified asthma, uncomplicated: Secondary | ICD-10-CM | POA: Diagnosis not present

## 2016-10-06 DIAGNOSIS — F329 Major depressive disorder, single episode, unspecified: Secondary | ICD-10-CM | POA: Insufficient documentation

## 2016-10-06 DIAGNOSIS — G8929 Other chronic pain: Secondary | ICD-10-CM | POA: Diagnosis present

## 2016-10-06 DIAGNOSIS — G629 Polyneuropathy, unspecified: Secondary | ICD-10-CM | POA: Diagnosis not present

## 2016-10-06 DIAGNOSIS — Z87891 Personal history of nicotine dependence: Secondary | ICD-10-CM | POA: Diagnosis not present

## 2016-10-06 DIAGNOSIS — E785 Hyperlipidemia, unspecified: Secondary | ICD-10-CM | POA: Insufficient documentation

## 2016-10-06 DIAGNOSIS — Z5181 Encounter for therapeutic drug level monitoring: Secondary | ICD-10-CM

## 2016-10-06 DIAGNOSIS — M542 Cervicalgia: Secondary | ICD-10-CM

## 2016-10-06 DIAGNOSIS — M797 Fibromyalgia: Secondary | ICD-10-CM | POA: Diagnosis not present

## 2016-10-06 DIAGNOSIS — M961 Postlaminectomy syndrome, not elsewhere classified: Secondary | ICD-10-CM | POA: Diagnosis not present

## 2016-10-06 DIAGNOSIS — Z981 Arthrodesis status: Secondary | ICD-10-CM | POA: Diagnosis not present

## 2016-10-06 DIAGNOSIS — G894 Chronic pain syndrome: Secondary | ICD-10-CM

## 2016-10-06 MED ORDER — TRAMADOL HCL 50 MG PO TABS: 50.0000 mg | ORAL_TABLET | Freq: Two times a day (BID) | ORAL | 2 refills | Status: DC | PRN

## 2016-10-06 MED ORDER — MORPHINE SULFATE ER 30 MG PO TBCR: 30.0000 mg | EXTENDED_RELEASE_TABLET | Freq: Three times a day (TID) | ORAL | 0 refills | Status: DC

## 2016-10-06 NOTE — Progress Notes (Signed)
Subjective:    Patient ID: Cindy Pratt, female    DOB: [birth year], 51 y.o.   MRN: [id number]  HPI: Cindy Pratt is a 51 year old female who returns for follow up appointment for chronic pain and medication refill. She states her pain is located in her neck and lower back radiating into her right lower extremity anteriorly. She rates her pain 7. Her current exercise regime is walking in the home and using stationary bicycle for 10 minutes twice a day every other day.  Pain Inventory Average Pain 9 Pain Right Now 7 My pain is sharp, burning, stabbing, tingling and aching  In the last 24 hours, has pain interfered with the following? General activity NA Relation with others NA Enjoyment of life NA What TIME of day is your pain at its worst? NA Sleep (in general) NA  Pain is worse with: NA Pain improves with: NA Relief from Meds: NA  Mobility Do you have any goals in this area?  no  Function Do you have any goals in this area?  no  Neuro/Psych weakness numbness tremor tingling dizziness confusion depression anxiety  Prior Studies Any changes since last visit?  no  Physicians involved in your care Any changes since last visit?  no   Family History  Problem Relation Age of Onset  . Hypertension Mother   . Hypertension Father   . Breast cancer Maternal Grandmother   . Liver cancer Maternal Grandmother   . Cancer Maternal Grandmother     breast cancer  . Ovarian cancer Cousin   . Cancer Cousin     1st c. maternal side/ cervical cancer  . Cancer Cousin     1st c maternal/ colon cancer  . Diabetes Maternal Grandfather     both sides   Social History   Social History  . Marital status: Married    Spouse name: [name]  . Number of children: 4  . Years of education: M   Occupational History  . disabled Disable   Social History Main Topics  . Smoking status: Former Smoker    Packs/day: 1.00    Years: 20.00    Types: Cigarettes    Quit date:  07/31/2007  . Smokeless tobacco: Never Used  . Alcohol use No  . Drug use: No  . Sexual activity: Yes    Birth control/ protection: Surgical   Other Topics Concern  . None   Social History Narrative   Lives at home with husband [name]   Drinks 4 sodas a day   Past Surgical History:  Procedure Laterality Date  . ABDOMINAL HYSTERECTOMY    . BACK SURGERY  1997, 2003  . CYSTECTOMY    . DIAGNOSTIC LAPAROSCOPY    . WRIST ARTHROPLASTY Left   . WRIST ARTHROSCOPY WITH DEBRIDEMENT Right 11/13/2015   Procedure: RIGHT WRIST ARTHROSCOPY WITH DEBRIDEMENT;  Surgeon: Betha Loa, MD;  Location: [facility];  Service: Orthopedics;  Laterality: Right;   Past Medical History:  Diagnosis Date  . Anxiety   . Asthma   . Belchings   . Chronic pain syndrome   . Degenerative joint disease   . Depression   . Disturbance of skin sensation   . Fibromyalgia   . GERD (gastroesophageal reflux disease)   . Hyperlipidemia   . Lumbar post-laminectomy syndrome   . Lumbosacral neuritis   . Osteoarthritis   . Other chronic postoperative pain   . Sciatica    BP 115/77 (BP Location:  Right Arm, Patient Position: Sitting, Cuff Size: Normal)   Pulse 69   SpO2 97%   Opioid Risk Score:   Fall Risk Score:  `1  Depression screen PHQ 2/9  Depression screen [facility] 2/9 02/16/2016 10/20/2015 05/22/2015 12/02/2014  Decreased Interest 0 1 1 1   Down, Depressed, Hopeless 0 1 1 1   PHQ - 2 Score 0 2 2 2   Altered sleeping - - - 1  Tired, decreased energy - - - 1  Change in appetite - - - 0  Feeling bad or failure about yourself  - - - 0  Trouble concentrating - - - 1  Moving slowly or fidgety/restless - - - 0  Suicidal thoughts - - - 0  PHQ-9 Score - - - 5     Review of Systems  HENT: Negative.   Eyes: Negative.   Respiratory: Negative.   Cardiovascular: Negative.   Gastrointestinal: Negative.   Endocrine: Negative.   Genitourinary: Negative.   Musculoskeletal: Negative.   Skin: Negative.     Allergic/Immunologic: Negative.   Neurological: Positive for weakness and numbness.       Tingling  Hematological: Negative.   Psychiatric/Behavioral: Positive for confusion and dysphoric mood. The patient is nervous/anxious.   All other systems reviewed and are negative.      Objective:   Physical Exam  Constitutional: She is oriented to person, place, and time. She appears well-developed and well-nourished.  HENT:  Head: Normocephalic and atraumatic.  Neck: Normal range of motion. Neck supple.  Cardiovascular: Normal rate and regular rhythm.   Pulmonary/Chest: Effort normal and breath sounds normal.  Musculoskeletal:  Normal Muscle Bulk and Muscle Testing Reveals:  Upper Extremities: Full ROM and Muscle Strength 5/5 Thoracic Paraspinal Tenderness: T-1-T-3 Lumbar Paraspinal Tenderness: L-3-L-5 Lower Extremities: Full ROM and Muscle Strength 5/5 Arises from Table with ease Narrow Based Gait   Neurological: She is alert and oriented to person, place, and time.  Skin: Skin is warm and dry.  Psychiatric: She has a normal mood and affect.  Nursing note and vitals reviewed.         Assessment & Plan:  1.Lumbar postlaminectomy syndrome status post L5-S1 fusion radiating to ZOX:[id number]:Refilled: MS contin 30 mg # 90 pills--use one pill every 8 hours for pain and Tramadol 50 mg BID. #60. 10/06/2016 We will continue the opioid monitoring program, this consists of regular clinic visits, examinations, urine drug screen, pill counts as well as use of [place] Controlled Substance reporting System. 2. Depression: Continue Current Medication Regimen of Prozac, Latuda and Wellbutrin. PCP following 3. Tremors: No complaints: Neurology Following : Dr. Oswaldo ConroyPenamali 4. Muscle Spasm: Continue Tizanidine  20 minutes of face to face patient care time was spent during this visit. All questions were encouraged and answered.  F/U in 1 month

## 2016-10-29 ENCOUNTER — Encounter: Payer: Medicare Other | Attending: Registered Nurse | Admitting: Registered Nurse

## 2016-10-29 ENCOUNTER — Encounter: Payer: Self-pay | Admitting: Registered Nurse

## 2016-10-29 VITALS — BP 105/75 | HR 69

## 2016-10-29 DIAGNOSIS — F329 Major depressive disorder, single episode, unspecified: Secondary | ICD-10-CM | POA: Insufficient documentation

## 2016-10-29 DIAGNOSIS — E785 Hyperlipidemia, unspecified: Secondary | ICD-10-CM | POA: Diagnosis not present

## 2016-10-29 DIAGNOSIS — Z5181 Encounter for therapeutic drug level monitoring: Secondary | ICD-10-CM | POA: Diagnosis not present

## 2016-10-29 DIAGNOSIS — G629 Polyneuropathy, unspecified: Secondary | ICD-10-CM | POA: Diagnosis not present

## 2016-10-29 DIAGNOSIS — G8929 Other chronic pain: Secondary | ICD-10-CM | POA: Diagnosis present

## 2016-10-29 DIAGNOSIS — M797 Fibromyalgia: Secondary | ICD-10-CM | POA: Diagnosis not present

## 2016-10-29 DIAGNOSIS — J45909 Unspecified asthma, uncomplicated: Secondary | ICD-10-CM | POA: Diagnosis not present

## 2016-10-29 DIAGNOSIS — M1990 Unspecified osteoarthritis, unspecified site: Secondary | ICD-10-CM | POA: Insufficient documentation

## 2016-10-29 DIAGNOSIS — Z87891 Personal history of nicotine dependence: Secondary | ICD-10-CM | POA: Insufficient documentation

## 2016-10-29 DIAGNOSIS — M533 Sacrococcygeal disorders, not elsewhere classified: Secondary | ICD-10-CM

## 2016-10-29 DIAGNOSIS — Z981 Arthrodesis status: Secondary | ICD-10-CM | POA: Diagnosis not present

## 2016-10-29 DIAGNOSIS — Z79899 Other long term (current) drug therapy: Secondary | ICD-10-CM

## 2016-10-29 DIAGNOSIS — M961 Postlaminectomy syndrome, not elsewhere classified: Secondary | ICD-10-CM

## 2016-10-29 DIAGNOSIS — M5416 Radiculopathy, lumbar region: Secondary | ICD-10-CM

## 2016-10-29 DIAGNOSIS — G894 Chronic pain syndrome: Secondary | ICD-10-CM

## 2016-10-29 MED ORDER — MORPHINE SULFATE ER 30 MG PO TBCR: 30.0000 mg | EXTENDED_RELEASE_TABLET | Freq: Three times a day (TID) | ORAL | 0 refills | Status: DC

## 2016-10-29 MED ORDER — TIZANIDINE HCL 4 MG PO TABS: 4.0000 mg | ORAL_TABLET | Freq: Two times a day (BID) | ORAL | 5 refills | Status: DC

## 2016-10-29 NOTE — Progress Notes (Signed)
Subjective:    Patient ID: Cindy Pratt, female    DOB: 04/15/1966, 51 y.o.   MRN: [id number]  HPI: Cindy Pratt is a 51 year old female who returns for follow up appointmentfor chronic pain and medication refill. She states her pain is located in her upper- lower back radiating into her right lower extremity anteriorly to her right knee. She rates her pain 7. Her current exercise regime is walking in the home and using stationary bicycle for 20 minutes daily.  Pain Inventory Average Pain 9 Pain Right Now 7 My pain is constant, sharp, burning, stabbing, tingling and aching  In the last 24 hours, has pain interfered with the following? General activity 8 Relation with others 8 Enjoyment of life 8 What TIME of day is your pain at its worst? all Sleep (in general) Fair  Pain is worse with: . Pain improves with: . Relief from Meds: .  Mobility walk without assistance Do you have any goals in this area?  no  Function Do you have any goals in this area?  no  Neuro/Psych weakness numbness tremor tingling dizziness confusion depression anxiety  Prior Studies Any changes since last visit?  no  Physicians involved in your care Any changes since last visit?  no   Family History  Problem Relation Age of Onset  . Hypertension Mother   . Hypertension Father   . Breast cancer Maternal Grandmother   . Liver cancer Maternal Grandmother   . Cancer Maternal Grandmother     breast cancer  . Ovarian cancer Cousin   . Cancer Cousin     1st c. maternal side/ cervical cancer  . Cancer Cousin     1st c maternal/ colon cancer  . Diabetes Maternal Grandfather     both sides   Social History   Social History  . Marital status: Married    Spouse name: Velda ShellHubert  . Number of children: 4  . Years of education: M   Occupational History  . disabled Disable   Social History Main Topics  . Smoking status: Former Smoker    Packs/day: 1.00    Years: 20.00    Types:  Cigarettes    Quit date: 07/31/2007  . Smokeless tobacco: Never Used  . Alcohol use No  . Drug use: No  . Sexual activity: Yes    Birth control/ protection: Surgical   Other Topics Concern  . None   Social History Narrative   Lives at home with husband Velda ShellHubert   Drinks 4 sodas a day   Past Surgical History:  Procedure Laterality Date  . ABDOMINAL HYSTERECTOMY    . BACK SURGERY  1997, 2003  . CYSTECTOMY    . DIAGNOSTIC LAPAROSCOPY    . WRIST ARTHROPLASTY Left   . WRIST ARTHROSCOPY WITH DEBRIDEMENT Right 11/13/2015   Procedure: RIGHT WRIST ARTHROSCOPY WITH DEBRIDEMENT;  Surgeon: Betha LoaKevin Kuzma, MD;  Location: [facility];  Service: Orthopedics;  Laterality: Right;   Past Medical History:  Diagnosis Date  . Anxiety   . Asthma   . Belchings   . Chronic pain syndrome   . Degenerative joint disease   . Depression   . Disturbance of skin sensation   . Fibromyalgia   . GERD (gastroesophageal reflux disease)   . Hyperlipidemia   . Lumbar post-laminectomy syndrome   . Lumbosacral neuritis   . Osteoarthritis   . Other chronic postoperative pain   . Sciatica    BP 105/75  Pulse 69   SpO2 95%   Opioid Risk Score:   Fall Risk Score:  `1  Depression screen PHQ 2/9  Depression screen [facility] 2/9 02/16/2016 10/20/2015 05/22/2015 12/02/2014  Decreased Interest 0 1 1 1   Down, Depressed, Hopeless 0 1 1 1   PHQ - 2 Score 0 2 2 2   Altered sleeping - - - 1  Tired, decreased energy - - - 1  Change in appetite - - - 0  Feeling bad or failure about yourself  - - - 0  Trouble concentrating - - - 1  Moving slowly or fidgety/restless - - - 0  Suicidal thoughts - - - 0  PHQ-9 Score - - - 5    Review of Systems  Constitutional: Negative.   HENT: Negative.   Eyes: Negative.   Respiratory: Negative.   Cardiovascular: Negative.   Gastrointestinal: Negative.   Endocrine: Negative.   Genitourinary: Negative.   Musculoskeletal: Positive for back pain and neck pain.  Skin:  Negative.   Allergic/Immunologic: Negative.   Neurological: Negative.   Hematological: Negative.   Psychiatric/Behavioral: Negative.        Objective:   Physical Exam  Constitutional: She is oriented to person, place, and time. She appears well-developed and well-nourished.  HENT:  Head: Normocephalic and atraumatic.  Neck: Normal range of motion. Neck supple.  Cardiovascular: Normal rate and regular rhythm.   Pulmonary/Chest: Effort normal and breath sounds normal.  Musculoskeletal:  Normal Muscle Bulk and Muscle Testing Reveals: Upper Extremities: Full ROM and Muscle Strength 5/5 Thoracic Paraspinal Tenderness: T-1-T-3 Lumbar Paraspinal Tenderness: L-3-L-5 Lower Extremities: Full ROM and Muscle Strength 5/5 Arises from Table with ease Narrow Based Gait  Neurological: She is alert and oriented to person, place, and time.  Skin: Skin is warm and dry.  Psychiatric: She has a normal mood and affect.  Nursing note and vitals reviewed.         Assessment & Plan:  1.Lumbar postlaminectomy syndrome status post L5-S1 fusion radiating to LLE: 10/29/2016 Refilled: MS contin 30 mg # 90 pills--use one pill every 8 hours for pain and Tramadol 50 mg BID. #60. 10/06/2016. We will continue the opioid monitoring program, this consists of regular clinic visits, examinations, urine drug screen, pill counts as well as use of [place] Controlled Substance reporting System. 2. Depression: Continue Current Medication Regimen of Prozac, Latuda and Wellbutrin. PCP following. 10/29/2016 3. Tremors: No complaints: Neurology Following : Dr. Oswaldo Conroy. 10/29/2016 4. Muscle Spasm: Continue Tizanidine. 10/29/2016 5. Sacroiliac Joint Dysfunction: Continue to monitor.  15  minutes of face to face patient care time was spent during this visit. All questions were encouraged and answered.    F/U in 1 month

## 2016-11-29 ENCOUNTER — Encounter: Payer: Medicare Other | Admitting: Registered Nurse

## 2016-12-02 ENCOUNTER — Encounter: Payer: Medicare Other | Attending: Registered Nurse | Admitting: Registered Nurse

## 2016-12-02 ENCOUNTER — Encounter: Payer: Self-pay | Admitting: Registered Nurse

## 2016-12-02 ENCOUNTER — Telehealth: Payer: Self-pay | Admitting: Registered Nurse

## 2016-12-02 ENCOUNTER — Ambulatory Visit
Admission: RE | Admit: 2016-12-02 | Discharge: 2016-12-02 | Disposition: A | Discharge status: Home / Self Care | Payer: Medicare Other | Source: Ambulatory Visit | Attending: Registered Nurse | Admitting: Registered Nurse

## 2016-12-02 VITALS — BP 111/75 | HR 76

## 2016-12-02 DIAGNOSIS — G629 Polyneuropathy, unspecified: Secondary | ICD-10-CM | POA: Diagnosis not present

## 2016-12-02 DIAGNOSIS — J45909 Unspecified asthma, uncomplicated: Secondary | ICD-10-CM | POA: Diagnosis not present

## 2016-12-02 DIAGNOSIS — Z79899 Other long term (current) drug therapy: Secondary | ICD-10-CM

## 2016-12-02 DIAGNOSIS — M1990 Unspecified osteoarthritis, unspecified site: Secondary | ICD-10-CM | POA: Diagnosis not present

## 2016-12-02 DIAGNOSIS — Z981 Arthrodesis status: Secondary | ICD-10-CM | POA: Diagnosis not present

## 2016-12-02 DIAGNOSIS — F329 Major depressive disorder, single episode, unspecified: Secondary | ICD-10-CM | POA: Insufficient documentation

## 2016-12-02 DIAGNOSIS — G894 Chronic pain syndrome: Secondary | ICD-10-CM | POA: Diagnosis not present

## 2016-12-02 DIAGNOSIS — E785 Hyperlipidemia, unspecified: Secondary | ICD-10-CM | POA: Diagnosis not present

## 2016-12-02 DIAGNOSIS — G8929 Other chronic pain: Secondary | ICD-10-CM | POA: Diagnosis present

## 2016-12-02 DIAGNOSIS — M5416 Radiculopathy, lumbar region: Secondary | ICD-10-CM | POA: Diagnosis not present

## 2016-12-02 DIAGNOSIS — M546 Pain in thoracic spine: Secondary | ICD-10-CM

## 2016-12-02 DIAGNOSIS — M961 Postlaminectomy syndrome, not elsewhere classified: Secondary | ICD-10-CM

## 2016-12-02 DIAGNOSIS — Z87891 Personal history of nicotine dependence: Secondary | ICD-10-CM | POA: Diagnosis not present

## 2016-12-02 DIAGNOSIS — M533 Sacrococcygeal disorders, not elsewhere classified: Secondary | ICD-10-CM

## 2016-12-02 DIAGNOSIS — M797 Fibromyalgia: Secondary | ICD-10-CM | POA: Diagnosis not present

## 2016-12-02 DIAGNOSIS — Z5181 Encounter for therapeutic drug level monitoring: Secondary | ICD-10-CM | POA: Diagnosis not present

## 2016-12-02 DIAGNOSIS — M545 Low back pain: Secondary | ICD-10-CM | POA: Diagnosis not present

## 2016-12-02 MED ORDER — MORPHINE SULFATE ER 30 MG PO TBCR: 30.0000 mg | EXTENDED_RELEASE_TABLET | Freq: Three times a day (TID) | ORAL | 0 refills | Status: DC

## 2016-12-02 NOTE — Progress Notes (Signed)
Subjective:    Patient ID: Cindy Pratt, female    DOB: December 02, 1965, 51 y.o.   MRN: [id number]  HPI: [name] is a 51 year old female who returns for follow up appointmentfor chronic pain and medication refill. She states her pain is located in her upper- lower back radiating into her right lower extremity anteriorly. Also states her lower back pain has increased in intensity over the last week, she requested a MRI, we will order Lumbar X-ray.  She rates her pain 7. Her current exercise regime is walking in the home.   Pain Inventory Average Pain 9 Pain Right Now 7 My pain is sharp, burning and stabbing  In the last 24 hours, has pain interfered with the following? General activity 6 Relation with others 6 Enjoyment of life 6 What TIME of day is your pain at its worst? . Sleep (in general) .  Pain is worse with: bending and sitting Pain improves with: rest and medication Relief from Meds: 5  Mobility walk without assistance  Function Do you have any goals in this area?  no  Neuro/Psych No problems in this area  Prior Studies Any changes since last visit?  no  Physicians involved in your care Any changes since last visit?  no   Family History  Problem Relation Age of Onset  . Hypertension Mother   . Hypertension Father   . Breast cancer Maternal Grandmother   . Liver cancer Maternal Grandmother   . Cancer Maternal Grandmother     breast cancer  . Ovarian cancer Cousin   . Cancer Cousin     1st c. maternal side/ cervical cancer  . Cancer Cousin     1st c maternal/ colon cancer  . Diabetes Maternal Grandfather     both sides   Social History   Social History  . Marital status: Married    Spouse name: Cindy Pratt  . Number of children: 4  . Years of education: M   Occupational History  . disabled Disable   Social History Main Topics  . Smoking status: Former Smoker    Packs/day: 1.00    Years: 20.00    Types: Cigarettes    Quit date:  07/31/2007  . Smokeless tobacco: Never Used  . Alcohol use No  . Drug use: No  . Sexual activity: Yes    Birth control/ protection: Surgical   Other Topics Concern  . None   Social History Narrative   Lives at home with husband Cindy Pratt   Drinks 4 sodas a day   Past Surgical History:  Procedure Laterality Date  . ABDOMINAL HYSTERECTOMY    . BACK SURGERY  1997, 2003  . CYSTECTOMY    . DIAGNOSTIC LAPAROSCOPY    . WRIST ARTHROPLASTY Left   . WRIST ARTHROSCOPY WITH DEBRIDEMENT Right 11/13/2015   Procedure: RIGHT WRIST ARTHROSCOPY WITH DEBRIDEMENT;  Surgeon: Betha Loa, MD;  Location: [facility];  Service: Orthopedics;  Laterality: Right;   Past Medical History:  Diagnosis Date  . Anxiety   . Asthma   . Belchings   . Chronic pain syndrome   . Degenerative joint disease   . Depression   . Disturbance of skin sensation   . Fibromyalgia   . GERD (gastroesophageal reflux disease)   . Hyperlipidemia   . Lumbar post-laminectomy syndrome   . Lumbosacral neuritis   . Osteoarthritis   . Other chronic postoperative pain   . Sciatica    BP 111/75  Pulse 76   SpO2 98%   Opioid Risk Score:   Fall Risk Score:  `1  Depression screen PHQ 2/9  Depression screen [facility] 2/9 02/16/2016 10/20/2015 05/22/2015 12/02/2014  Decreased Interest 0 Down, Depressed, Hopeless 0 PHQ - 2 Score 0 Altered sleeping - - - 1  Tired, decreased energy - - - 1  Change in appetite - - - 0  Feeling bad or failure about yourself  - - - 0  Trouble concentrating - - - 1  Moving slowly or fidgety/restless - - - 0  Suicidal thoughts - - - 0  PHQ-9 Score - - - 5    Review of Systems  Constitutional: Negative.   HENT: Negative.   Eyes: Negative.   Respiratory: Negative.   Cardiovascular: Negative.   Gastrointestinal: Negative.   Endocrine: Negative.   Genitourinary: Negative.   Musculoskeletal: Negative.   Skin: Negative.   Allergic/Immunologic: Negative.     Neurological: Negative.   Hematological: Negative.   Psychiatric/Behavioral: Negative.        Objective:   Physical Exam  Constitutional: She is oriented to person, place, and time. She appears well-developed and well-nourished.  HENT:  Head: Normocephalic and atraumatic.  Neck: Normal range of motion. Neck supple.  Cardiovascular: Normal rate and regular rhythm.   Pulmonary/Chest: Effort normal and breath sounds normal.  Musculoskeletal:  Normal Muscle Bulk and Muscle Testing Reveals: Upper Extremities: Decreased ROM 90 Degrees and Muscle Strength 4/5 Thoracic Paraspinal Tenderness: T-1-T-3 T-7-T-9 Lumbar Paraspinal Tenderness: L-10-L-12 Lower Extremities: Full ROM and Muscle Strength 5/5 Right Leg Raise Produces Pain into Right Hip Left Leg Raise Produces Pain into Left Buttock Arises from Table Slowly Narrow Based Gait  Neurological: She is alert and oriented to person, place, and time.  Skin: Skin is warm and dry.  Psychiatric: She has a normal mood and affect.  Nursing note and vitals reviewed.         Assessment & Plan:  1. Acute Exacerbation of Chronic Low Back Pain: RX: Lumbar X-ray 2.Lumbar postlaminectomy syndrome status post L5-S1 fusion radiating to LLE: 12/02/2016 Refilled: MS contin 30 mg # 90 pills--use one pill every 8 hours for pain and Continue Tramadol 50 mg BID. #60. We will continue the opioid monitoring program, this consists of regular clinic visits, examinations, urine drug screen, pill counts as well as use of [place] Controlled Substance reporting System. 3. Depression: Continue Current Medication Regimen of Prozac, Latuda and Wellbutrin. PCP following. 12/02/2016 4. Tremors: No complaints: Neurology Following : Dr. Oswaldo Conroy. 12/02/2016 5. Muscle Spasm: Continue Tizanidine. 12/02/2016 6. Sacroiliac Joint Dysfunction: Continue to monitor.12/02/2016  20 minutes of face to face patient care time was spent during this visit. All questions  were encouraged and answered.    F/U in 1 month

## 2016-12-02 NOTE — Telephone Encounter (Signed)
On 12/02/2016 the  NCCSR was reviewed no conflict was seen on the [facility] Controlled Substance Reporting System with multiple prescribers. Cindy Pratt has a signed narcotic contract with our office. If there were any discrepancies this would have been reported to her physician.

## 2016-12-03 ENCOUNTER — Telehealth: Payer: Self-pay | Admitting: Registered Nurse

## 2016-12-03 NOTE — Telephone Encounter (Signed)
Placed a call to Ms. Jhaveri, reviewed Lumbar X-ray, she verbalizes understanding.

## 2016-12-08 LAB — TOXASSURE SELECT,+ANTIDEPR,UR

## 2016-12-08 LAB — 6-ACETYLMORPHINE,TOXASSURE ADD
6-ACETYLMORPHINE: NEGATIVE
6-acetylmorphine: NOT DETECTED ng/mg creat

## 2016-12-15 ENCOUNTER — Telehealth: Payer: Self-pay | Admitting: *Deleted

## 2016-12-15 NOTE — Telephone Encounter (Signed)
Urine drug screen for this encounter is consistent for prescribed medication 

## 2016-12-22 DIAGNOSIS — F319 Bipolar disorder, unspecified: Secondary | ICD-10-CM | POA: Insufficient documentation

## 2016-12-28 ENCOUNTER — Encounter: Payer: Self-pay | Admitting: Physical Medicine & Rehabilitation

## 2016-12-28 ENCOUNTER — Encounter: Payer: Medicare Other | Attending: Registered Nurse

## 2016-12-28 ENCOUNTER — Ambulatory Visit (HOSPITAL_BASED_OUTPATIENT_CLINIC_OR_DEPARTMENT_OTHER): Payer: Medicare Other | Admitting: Physical Medicine & Rehabilitation

## 2016-12-28 VITALS — BP 120/78 | HR 76

## 2016-12-28 DIAGNOSIS — J45909 Unspecified asthma, uncomplicated: Secondary | ICD-10-CM | POA: Diagnosis not present

## 2016-12-28 DIAGNOSIS — Z981 Arthrodesis status: Secondary | ICD-10-CM | POA: Diagnosis not present

## 2016-12-28 DIAGNOSIS — Z87891 Personal history of nicotine dependence: Secondary | ICD-10-CM | POA: Insufficient documentation

## 2016-12-28 DIAGNOSIS — M961 Postlaminectomy syndrome, not elsewhere classified: Secondary | ICD-10-CM | POA: Insufficient documentation

## 2016-12-28 DIAGNOSIS — F329 Major depressive disorder, single episode, unspecified: Secondary | ICD-10-CM | POA: Insufficient documentation

## 2016-12-28 DIAGNOSIS — M1990 Unspecified osteoarthritis, unspecified site: Secondary | ICD-10-CM | POA: Insufficient documentation

## 2016-12-28 DIAGNOSIS — G629 Polyneuropathy, unspecified: Secondary | ICD-10-CM | POA: Diagnosis not present

## 2016-12-28 DIAGNOSIS — M533 Sacrococcygeal disorders, not elsewhere classified: Secondary | ICD-10-CM

## 2016-12-28 DIAGNOSIS — M797 Fibromyalgia: Secondary | ICD-10-CM | POA: Diagnosis not present

## 2016-12-28 DIAGNOSIS — E785 Hyperlipidemia, unspecified: Secondary | ICD-10-CM | POA: Insufficient documentation

## 2016-12-28 DIAGNOSIS — G8929 Other chronic pain: Secondary | ICD-10-CM | POA: Diagnosis present

## 2016-12-28 MED ORDER — TRAMADOL HCL 50 MG PO TABS: 50.0000 mg | ORAL_TABLET | Freq: Two times a day (BID) | ORAL | 2 refills | Status: DC | PRN

## 2016-12-28 MED ORDER — MORPHINE SULFATE ER 30 MG PO TBCR: 30.0000 mg | EXTENDED_RELEASE_TABLET | Freq: Three times a day (TID) | ORAL | 0 refills | Status: DC

## 2016-12-28 NOTE — Patient Instructions (Signed)
PT will call you for the therapy appt  Please cont Home exercise program after PT is finished

## 2016-12-28 NOTE — Progress Notes (Signed)
Subjective:    Patient ID: Cindy A PearsBirdena Pratt   DOB: 1966-07-01, 51 y.o.   MRN: [id number]  HPI New PCP, took xrays Of hip that are not available in the Epic system. Patient had normal urine drug screen. 12/02/2016 No new pain complaints Discussed last M.D. visit 07/13/2016, bilateral sacroiliac injections helped for about 2 days. We discussed that this confirms the sacroiliac is a pain generator. However, injection could not provide a longer term relief. We discussed other treatment options for sacroiliac dysfunction including physical therapy Pain Inventory Average Pain 8 Pain Right Now 8 My pain is constant  In the last 24 hours, has pain interfered with the following? General activity 8 Relation with others 8 Enjoyment of life 8 What TIME of day is your pain at its worst? all Sleep (in general) Fair  Pain is worse with: . Pain improves with: medication Relief from Meds: 8  Mobility Do you have any goals in this area?  no  Function Do you have any goals in this area?  no  Neuro/Psych weakness numbness tremor tingling dizziness confusion depression anxiety  Prior Studies Any changes since last visit?  no CLINICAL DATA:  Recent fall, back surgery  EXAM: LUMBAR SPINE - 2-3 VIEW  COMPARISON:  07/24/2013  FINDINGS: Three views of the lumbar spine submitted. No acute fracture or subluxation. There is posterior fusion with transpedicular screws and metallic rods at W0-J8 level with anatomic alignment. The fusion material appears intact.  IMPRESSION: No acute fracture or subluxation. Stable posterior fusion at L5-S1 level.   Electronically Signed   By: Natasha Mead M.D.   On: 12/02/2016 15:52 Physicians involved in your care Any changes since last visit?  no   Family History  Problem Relation Age of Onset  . Hypertension Mother   . Hypertension Father   . Breast cancer Maternal Grandmother   . Liver cancer Maternal Grandmother   . Cancer  Maternal Grandmother     breast cancer  . Ovarian cancer Cousin   . Cancer Cousin     1st c. maternal side/ cervical cancer  . Cancer Cousin     1st c maternal/ colon cancer  . Diabetes Maternal Grandfather     both sides   Social History   Social History  . Marital status: Married    Spouse name: [name]  . Number of children: 4  . Years of education: M   Occupational History  . disabled Disable   Social History Main Topics  . Smoking status: Former Smoker    Packs/day: 1.00    Years: 20.00    Types: Cigarettes    Quit date: 07/31/2007  . Smokeless tobacco: Never Used  . Alcohol use No  . Drug use: No  . Sexual activity: Yes    Birth control/ protection: Surgical   Other Topics Concern  . None   Social History Narrative   Lives at home with husband [name]   Drinks 4 sodas a day   Past Surgical History:  Procedure Laterality Date  . ABDOMINAL HYSTERECTOMY    . BACK SURGERY  1997, 2003  . CYSTECTOMY    . DIAGNOSTIC LAPAROSCOPY    . WRIST ARTHROPLASTY Left   . WRIST ARTHROSCOPY WITH DEBRIDEMENT Right 11/13/2015   Procedure: RIGHT WRIST ARTHROSCOPY WITH DEBRIDEMENT;  Surgeon: Betha Loa, MD;  Location: [facility];  Service: Orthopedics;  Laterality: Right;   Past Medical History:  Diagnosis Date  . Anxiety   .  Asthma   . Belchings   . Chronic pain syndrome   . Degenerative joint disease   . Depression   . Disturbance of skin sensation   . Fibromyalgia   . GERD (gastroesophageal reflux disease)   . Hyperlipidemia   . Lumbar post-laminectomy syndrome   . Lumbosacral neuritis   . Osteoarthritis   . Other chronic postoperative pain   . Sciatica    BP 120/78   Pulse 76   SpO2 97%   Opioid Risk Score:   Fall Risk Score:  `1  Depression screen PHQ 2/9  Depression screen [facility] 2/9 02/16/2016 10/20/2015 05/22/2015 12/02/2014  Decreased Interest 0 Down, Depressed, Hopeless 0 PHQ - 2 Score 0 Altered sleeping - - - 1    Tired, decreased energy - - - 1  Change in appetite - - - 0  Feeling bad or failure about yourself  - - - 0  Trouble concentrating - - - 1  Moving slowly or fidgety/restless - - - 0  Suicidal thoughts - - - 0  PHQ-9 Score - - - 5    Review of Systems  Constitutional: Negative.   HENT: Negative.   Eyes: Negative.   Respiratory: Negative.   Cardiovascular: Negative.   Gastrointestinal: Negative.   Endocrine: Negative.   Genitourinary: Negative.   Musculoskeletal: Negative.   Skin: Negative.   Allergic/Immunologic: Negative.   Neurological: Negative.   Hematological: Negative.   Psychiatric/Behavioral: Negative.   All other systems reviewed and are negative.      Objective:   Physical Exam  Constitutional: She is oriented to person, place, and time. She appears well-developed and well-nourished.  HENT:  Head: Normocephalic and atraumatic.  Neurological: She is alert and oriented to person, place, and time.  Reflex Scores:      Patellar reflexes are 2+ on the right side and 2+ on the left side.      Achilles reflexes are 2+ on the right side and 0 on the left side. Decreased pinprick sensation left S1  Psychiatric: She has a normal mood and affect. Her behavior is normal. Judgment and thought content normal.  Nursing note and vitals reviewed.  Decreased lumbar range of motion with flexion, extension, lateral bending and rotation. She has equivocal. Faber's bilaterally. Motor strength is 5/5 bilateral lower extremities.        Assessment & Plan:  1. Lumbar postlaminectomy syndrome with L5-S1 fusion, she has postoperative sacroiliac disorder, referral for physical therapy for stabilization exercises. Continue current medications MS Contin 30 mg every 8 hours, patient asked whether we can increase her narcotic analgesic, however, we discussed that the increased risk of doses greater than 90 mg of morphine per day , do not justify the meager benefit.

## 2017-01-03 ENCOUNTER — Other Ambulatory Visit: Payer: Self-pay | Admitting: Internal Medicine

## 2017-01-03 DIAGNOSIS — Z1231 Encounter for screening mammogram for malignant neoplasm of breast: Secondary | ICD-10-CM

## 2017-01-19 ENCOUNTER — Ambulatory Visit
Admission: RE | Admit: 2017-01-19 | Discharge: 2017-01-19 | Disposition: A | Discharge status: Home / Self Care | Payer: Medicare Other | Source: Ambulatory Visit | Attending: Internal Medicine | Admitting: Internal Medicine

## 2017-01-19 DIAGNOSIS — Z1231 Encounter for screening mammogram for malignant neoplasm of breast: Secondary | ICD-10-CM

## 2017-01-21 ENCOUNTER — Telehealth: Payer: Self-pay | Admitting: Registered Nurse

## 2017-01-21 NOTE — Telephone Encounter (Signed)
On 01/21/2017 the  NCC[place] was reviewed no conflict was seen [place] the [facility] Controlled[place] Reporting System with multiple prescribers. [name] has a signed narcotic contract with our office. If there were any discrepancies this would have been reported to her physician.

## 2017-01-25 ENCOUNTER — Encounter (HOSPITAL_BASED_OUTPATIENT_CLINIC_OR_DEPARTMENT_OTHER): Payer: Medicare Other | Admitting: Registered Nurse

## 2017-01-25 ENCOUNTER — Encounter: Payer: Self-pay | Admitting: Registered Nurse

## 2017-01-25 VITALS — BP 112/76 | HR 88 | Resp 14

## 2017-01-25 DIAGNOSIS — Z79899 Other long term (current) drug therapy: Secondary | ICD-10-CM | POA: Diagnosis not present

## 2017-01-25 DIAGNOSIS — Z5181 Encounter for therapeutic drug level monitoring: Secondary | ICD-10-CM

## 2017-01-25 DIAGNOSIS — M961 Postlaminectomy syndrome, not elsewhere classified: Secondary | ICD-10-CM | POA: Diagnosis not present

## 2017-01-25 DIAGNOSIS — M542 Cervicalgia: Secondary | ICD-10-CM

## 2017-01-25 DIAGNOSIS — M533 Sacrococcygeal disorders, not elsewhere classified: Secondary | ICD-10-CM | POA: Diagnosis not present

## 2017-01-25 DIAGNOSIS — G894 Chronic pain syndrome: Secondary | ICD-10-CM

## 2017-01-25 DIAGNOSIS — M5416 Radiculopathy, lumbar region: Secondary | ICD-10-CM | POA: Diagnosis not present

## 2017-01-25 MED ORDER — MORPHINE SULFATE ER 30 MG PO TBCR: 30.0000 mg | EXTENDED_RELEASE_TABLET | Freq: Three times a day (TID) | ORAL | 0 refills | Status: DC

## 2017-01-25 NOTE — Progress Notes (Signed)
Subjective:    Patient ID: Cindy Pratt, female    DOB: 01/09/1966, 51 y.o.   MRN: [id number]  HPI:  Cindy Pratt is a 51year old female who returns for follow up appointmentfor chronic pain and medication refill. She states her pain is located in her neck and lower back radiating into her left buttock.She rates her pain 8.Her current exercise regime is walking in the home and performing stretching exercises.   Lumbar X-ray from 12/02/2016: FINDINGS: Three views of the lumbar spine submitted. No acute fracture or subluxation. There is posterior fusion with transpedicular screws and metallic rods at W0-J8 level with anatomic alignment. The fusion material appears intact.  IMPRESSION: No acute fracture or subluxation. Stable posterior fusion at L5-S1 level.  Also states her PCP referred her to an Orthopedist, she has an appointment scheduled for 02/18/2017.   Last UDS was performed on : 12/02/2016, it was consistent.   Pain Inventory Average Pain 8 Pain Right Now 8 My pain is constant, sharp, burning, tingling and aching  In the last 24 hours, has pain interfered with the following? General activity 8 Relation with others 8 Enjoyment of life 8 What TIME of day is your pain at its worst? all Sleep (in general) Fair  Pain is worse with: unsure Pain improves with: medication Relief from Meds: 9  Mobility walk without assistance Do you have any goals in this area?  no  Function Do you have any goals in this area?  no  Neuro/Psych weakness numbness tremor tingling dizziness confusion depression anxiety  Prior Studies Any changes since last visit?  no  Physicians involved in your care Any changes since last visit?  no   Family History  Problem Relation Age of Onset  . Hypertension Mother   . Hypertension Father   . Breast cancer Maternal Grandmother   . Liver cancer Maternal Grandmother   . Cancer Maternal Grandmother        breast cancer    . Ovarian cancer Cousin   . Cancer Cousin        1st c. maternal side/ cervical cancer  . Cancer Cousin        1st c maternal/ colon cancer  . Diabetes Maternal Grandfather        both sides   Social History   Social History  . Marital status: Married    Spouse name: [name]  . Number of children: 4  . Years of education: M   Occupational History  . disabled Disable   Social History Main Topics  . Smoking status: Former Smoker    Packs/day: 1.00    Years: 20.00    Types: Cigarettes    Quit date: 07/31/2007  . Smokeless tobacco: Never Used  . Alcohol use No  . Drug use: No  . Sexual activity: Yes    Birth control/ protection: Surgical   Other Topics Concern  . None   Social History Narrative   Lives at home with husband [name]   Drinks 4 sodas a day   Past Surgical History:  Procedure Laterality Date  . ABDOMINAL HYSTERECTOMY    . BACK SURGERY  1997, 2003  . CYSTECTOMY    . DIAGNOSTIC LAPAROSCOPY    . WRIST ARTHROPLASTY Left   . WRIST ARTHROSCOPY WITH DEBRIDEMENT Right 11/13/2015   Procedure: RIGHT WRIST ARTHROSCOPY WITH DEBRIDEMENT;  Surgeon: Betha Loa, MD;  Location: [facility];  Service: Orthopedics;  Laterality: Right;   Past Medical History:  Diagnosis Date  . Anxiety   . Asthma   . Belchings   . Chronic pain syndrome   . Degenerative joint disease   . Depression   . Disturbance of skin sensation   . Fibromyalgia   . GERD (gastroesophageal reflux disease)   . Hyperlipidemia   . Lumbar post-laminectomy syndrome   . Lumbosacral neuritis   . Osteoarthritis   . Other chronic postoperative pain   . Sciatica    There were no vitals taken for this visit.  Opioid Risk Score:   Fall Risk Score:  `1  Depression screen PHQ 2/9  [facility] screen [facility] 2/9 02/16/2016 10/20/2015 05/22/2015 12/02/2014  Decreased Interest 0 1 1 1   Down, Depressed, Hopeless 0 1 1 1   PHQ - 2 Score 0 2 2 2   Altered sleeping - - - 1  Tired, decreased energy - -  - 1  Change in appetite - - - 0  Feeling bad or failure about yourself  - - - 0  Trouble concentrating - - - 1  Moving slowly or fidgety/restless - - - 0  Suicidal thoughts - - - 0  PHQ-9 Score - - - 5    Review of Systems  HENT: Negative.   Eyes: Negative.   Respiratory: Negative.   Cardiovascular: Negative.   Gastrointestinal: Negative.   Endocrine: Negative.   Genitourinary: Negative.   Musculoskeletal: Positive for arthralgias, back pain and neck pain.  Skin: Negative.   Allergic/Immunologic: Negative.   Neurological: Positive for dizziness, tremors, weakness and numbness.       Tingling  Hematological: Negative.   Psychiatric/Behavioral: Positive for confusion and dysphoric mood. The patient is nervous/anxious.        Objective:   Physical Exam  Constitutional: She is oriented to person, place, and time. She appears well-developed and well-nourished.  HENT:  Head: Normocephalic and atraumatic.  Neck: Normal range of motion. Neck supple.  Cardiovascular: Normal rate and regular rhythm.   Pulmonary/Chest: Effort normal and breath sounds normal.  Musculoskeletal:  Normal Muscle Bulk and Muscle Testing Reveals: Upper Extremities: Full ROM and Muscle Strength 5/5 Thoracic Paraspinal Tenderness: T-7- T-9 Lumbar Paraspinal Tenderness: L-3-L-5 Lower Extremities: Full ROM and Muscle Strength 5/5 Arises from Table with ease Narrow Based Gait  Neurological: She is alert and oriented to person, place, and time.  Skin: Skin is warm and dry.  Psychiatric: She has a normal mood and affect.  Nursing note and vitals reviewed.         Assessment & Plan:  1.Lumbar postlaminectomy syndrome status post L5-S1 fusion radiating to LLE: 01/25/2017 Refilled: MS contin 30 mg # 90 pills--use one pill every 8 hours for pain and Continue Tramadol 50 mg BID. #60. We will continue the opioid monitoring program, this consists of regular clinic visits, examinations, urine drug screen, pill  counts as well as use of [place] Controlled Substance reporting System. 2. Depression: Continue Current Medication Regimen of Prozac, Latuda and Wellbutrin. PCP following. 01/25/2017 3. Tremors: No complaints: Neurology Following : Dr. Oswaldo ConroyPenamali. 01/25/2017 4. Muscle Spasm: Continue Tizanidine. 01/25/2017 5. Sacroiliac Joint Dysfunction: Continue to monitor.01/25/2017  20 minutes of face to face patient care time was spent during this visit. All questions were encouraged and answered.   F/U in 1 month

## 2017-02-13 ENCOUNTER — Emergency Department (HOSPITAL_COMMUNITY): Payer: Medicare Other

## 2017-02-13 ENCOUNTER — Emergency Department (HOSPITAL_COMMUNITY)
Admission: EM | Admit: 2017-02-13 | Discharge: 2017-02-13 | Disposition: A | Discharge status: Home / Self Care | Payer: Medicare Other | Attending: Emergency Medicine | Admitting: Emergency Medicine

## 2017-02-13 ENCOUNTER — Encounter (HOSPITAL_COMMUNITY): Payer: Self-pay | Admitting: Adult Health

## 2017-02-13 DIAGNOSIS — Y929 Unspecified place or not applicable: Secondary | ICD-10-CM | POA: Insufficient documentation

## 2017-02-13 DIAGNOSIS — J45909 Unspecified asthma, uncomplicated: Secondary | ICD-10-CM | POA: Insufficient documentation

## 2017-02-13 DIAGNOSIS — X58XXXA Exposure to other specified factors, initial encounter: Secondary | ICD-10-CM | POA: Insufficient documentation

## 2017-02-13 DIAGNOSIS — T1490XA Injury, unspecified, initial encounter: Secondary | ICD-10-CM

## 2017-02-13 DIAGNOSIS — Z87891 Personal history of nicotine dependence: Secondary | ICD-10-CM | POA: Diagnosis not present

## 2017-02-13 DIAGNOSIS — S99921A Unspecified injury of right foot, initial encounter: Secondary | ICD-10-CM | POA: Diagnosis present

## 2017-02-13 DIAGNOSIS — S90221A Contusion of right lesser toe(s) with damage to nail, initial encounter: Secondary | ICD-10-CM | POA: Diagnosis not present

## 2017-02-13 DIAGNOSIS — Z79899 Other long term (current) drug therapy: Secondary | ICD-10-CM | POA: Diagnosis not present

## 2017-02-13 DIAGNOSIS — E119 Type 2 diabetes mellitus without complications: Secondary | ICD-10-CM | POA: Insufficient documentation

## 2017-02-13 DIAGNOSIS — Y9301 Activity, walking, marching and hiking: Secondary | ICD-10-CM | POA: Diagnosis not present

## 2017-02-13 DIAGNOSIS — Y999 Unspecified external cause status: Secondary | ICD-10-CM | POA: Diagnosis not present

## 2017-02-13 NOTE — ED Provider Notes (Signed)
[facility]-EMERG[facility]Y DEPT Provider Note   CSN: [id number] Arrival date & time: 02/13/17  1832     History   Chief Complaint Chief Complaint  Patient presents with  . Foot Injury    HPI Cindy Pratt is a 51 y.o. female.  HPI   Cindy Pratt is a 51 y.o. female who presents to the Emergency Department complaining of pain to the right little toe and nail injury that occurred yesterday. She describes a direct blow to her toe while walking that occurred 2 yesterday. She states the second injury "ripped" the needle off. She now describes a throbbing sensation to her toe and swelling of the toe. Pain is worse with weightbearing. She states she is in a pain management clinic and currently takes pain medication daily which has helped somewhat with controlling her toe pain. She denies excessive bleeding, numbness of the toe, or pain to the foot or ankle.  Tetanus is up-to-date   Past Medical History:  Diagnosis Date  . Anxiety   . Asthma   . Belchings   . Chronic pain syndrome   . Degenerative joint disease   . Depression   . Disturbance of skin sensation   . Fibromyalgia   . GERD (gastroesophageal reflux disease)   . Hyperlipidemia   . Lumbar post-laminectomy syndrome   . Lumbosacral neuritis   . Osteoarthritis   . Other chronic postoperative pain   . Sciatica     Patient Active Problem List   Diagnosis Date Noted  . Postlaminectomy syndrome, lumbar region 10/21/2011  . Thoracic or lumbosacral neuritis or radiculitis, unspecified 10/21/2011  . Chronic pain syndrome 10/21/2011  . Sternoclavicular joint disorder 10/21/2011  . DIABETES MELLITUS, TYPE II 08/14/2009  . FLANK PAIN, RIGHT 08/05/2009  . HYPERHIDROSIS 05/26/2009  . SWELLING, MASS, OR LUMP IN CHEST 05/26/2009  . ABDOMINAL TENDERNESS, RIGHT UPPER QUADRANT 05/26/2009  . BACK PAIN 02/18/2009  . ANXIETY 07/22/2008  . DEPRESSION 07/22/2008  . PERIPHERAL NEUROPATHY 07/22/2008  . ALLERGIC RHINITIS 07/22/2008  . ASTHMA  07/22/2008  . LOW BACK PAIN 07/22/2008  . Fibromyalgia syndrome 07/22/2008  . Nonspecific (abnormal) findings on radiological and other examination of body structure 07/22/2008  . NEPHROLITHIASIS, HX OF 07/22/2008  . CHEST XRAY, ABNORMAL 07/22/2008    Past Surgical History:  Procedure Laterality Date  . ABDOMINAL HYSTERECTOMY    . BACK SURGERY  1997, 2003  . CYSTECTOMY    . DIAGNOSTIC LAPAROSCOPY    . WRIST ARTHROPLASTY Left   . WRIST ARTHROSCOPY WITH DEBRIDEMENT Right 11/13/2015   Procedure: RIGHT WRIST ARTHROSCOPY WITH DEBRIDEMENT;  Surgeon: Betha Loa, MD;  Location: [facility];  Service: Orthopedics;  Laterality: Right;    OB History    No data available       Home Medications    Prior to Admission medications   Medication Sig Start Date End Date Taking? Authorizing Provider  albuterol (PROAIR HFA) 108 (90 BASE) MCG/ACT inhaler Inhale 1-2 puffs into the lungs every 4 (four) hours as needed for wheezing or shortness of breath.  02/24/15 02/13/17 Yes [provider]  B Complex-Folic Acid (SUPER B COMPLEX MAXI PO) Take 1 tablet by mouth daily.   Yes [provider]  buPROPion (WELLBUTRIN SR) 150 MG 12 hr tablet Take 150 mg by mouth 2 (two) times daily.   Yes [provider]  cetirizine (ZYRTEC) 10 MG tablet Take 10 mg by mouth daily.   Yes [provider]  FLUoxetine HCl 60 MG TABS  Take 60 mg by mouth daily. 03/28/15  Yes [provider]  lurasidone (LATUDA) 40 MG TABS tablet Take 40 mg by mouth at bedtime.    Yes [provider]  morphine (MS CONTIN) 30 MG 12 hr tablet Take 1 tablet (30 mg total) by mouth every 8 (eight) hours. 01/25/17  Yes Jones Bales, NP  Multiple Vitamin (MULTIVITAMIN WITH MINERALS) TABS tablet Take 1 tablet by mouth daily.   Yes [provider]  tiZANidine (ZANAFLEX) 4 MG tablet Take 1 tablet (4 mg total) by mouth 2 (two) times daily. 10/29/16  Yes Jones Bales, NP  traMADol  (ULTRAM) 50 MG tablet Take 1 tablet (50 mg total) by mouth 2 (two) times daily as needed. 12/28/16  Yes Kirsteins, Victorino Sparrow, MD    Family History Family History  Problem Relation Age of Onset  . Hypertension Mother   . Hypertension Father   . Breast cancer Maternal Grandmother   . Liver cancer Maternal Grandmother   . Cancer Maternal Grandmother        breast cancer  . Ovarian cancer Cousin   . Cancer Cousin        1st c. maternal side/ cervical cancer  . Cancer Cousin        1st c maternal/ colon cancer  . Diabetes Maternal Grandfather        both sides    Social History Social History  Substance Use Topics  . Smoking status: Former Smoker    Packs/day: 1.00    Years: 20.00    Types: Cigarettes    Quit date: 07/31/2007  . Smokeless tobacco: Never Used  . Alcohol use No     Allergies   Aspirin; Nsaids; Sulfa antibiotics; Sulfonamide derivatives; Tolmetin; Cymbalta [duloxetine hcl]; Duloxetine; Gabapentin; Other; and Pregabalin   Review of Systems Review of Systems  Constitutional: Negative for chills and fever.  Genitourinary: Negative for difficulty urinating and dysuria.  Musculoskeletal: Positive for arthralgias (Right fifth toe pain and swelling) and joint swelling.  Skin: Negative for color change and wound.  Neurological: Negative for weakness and numbness.  All other systems reviewed and are negative.    Physical Exam Updated Vital Signs BP 123/75   Pulse 77   Temp 98.4 F (36.9 C) (Oral)   Resp 18   SpO2 96%   Physical Exam  Constitutional: She is oriented to person, place, and time. She appears well-developed and well-nourished. No distress.  HENT:  Head: Normocephalic and atraumatic.  Cardiovascular: Normal rate, regular rhythm and intact distal pulses.   Pulmonary/Chest: Effort normal and breath sounds normal.  Musculoskeletal: She exhibits edema and tenderness. She exhibits no deformity.  Tenderness to palpation of the right fifth toe, mild to  moderate edema of the toe without bony deformity. Complete avulsion of the toenail, nail matrix appears dried. No active bleeding  No proximal tenderness.  Neurological: She is alert and oriented to person, place, and time. She exhibits normal muscle tone. Coordination normal.  Skin: Skin is warm and dry. Capillary refill takes less than 2 seconds.  Nursing note and vitals reviewed.    ED Treatments / Results  Labs (all labs ordered are listed, but only abnormal results are displayed) Labs Reviewed - No data to display  EKG  EKG Interpretation None       Radiology Dg Toe 5th Right  Result Date: 02/13/2017 CLINICAL DATA:  51 year old female with trauma to the right fifth toe. EXAM: RIGHT FIFTH TOE COMPARISON:  None. FINDINGS:  There is no evidence of fracture or dislocation. There is no evidence of arthropathy or other focal bone abnormality. Soft tissues are unremarkable. IMPRESSION: Negative. Electronically Signed   By: Elgie CollardArash  Radparvar M.D.   On: 02/13/2017 19:48    Procedures Procedures (including critical care time)  Medications Ordered in ED Medications - No data to display   Initial Impression / Assessment and Plan / ED Course  I have reviewed the triage vital signs and the nursing notes.  Pertinent labs & imaging results that were available during my care of the patient were reviewed by me and considered in my medical decision making (see chart for details).     Patient well-appearing. Blunt injury of the right fifth toe with complete avulsion of the nail. Bleeding is controlled. No bony deformities noted. NV intact.  No tenderness proximal to the toe.  Fifth toe was buddy taped and postop shoe applied. Patient is currently under pain management and has pain medication at home, she agrees to elevate and apply ice, referral information given for orthopedics.  Final Clinical Impressions(s) / ED Diagnoses   Final diagnoses:  Injury  Contusion of lesser toe of right  foot with damage to nail, initial encounter    New Prescriptions New Prescriptions   No medications on file     Rosey Bathriplett, Nicola Heinemann, PA-C 02/13/17 2010    Raeford RazorKohut, Stephen, MD 02/17/17 1034

## 2017-02-13 NOTE — ED Triage Notes (Signed)
Pt hit cat tree x 2 and injured her to R little toe, ripping the nail and now with pain  [facility]

## 2017-02-13 NOTE — Discharge Instructions (Signed)
Elevate your foot and apply ice packs on and off. Keep the toes buddy taped for at least 2 weeks. Call the orthopedic doctor listed to arrange a follow-up appointment if needed.

## 2017-02-18 ENCOUNTER — Other Ambulatory Visit: Payer: Self-pay | Admitting: Orthopedic Surgery

## 2017-02-18 DIAGNOSIS — M5416 Radiculopathy, lumbar region: Secondary | ICD-10-CM

## 2017-02-18 DIAGNOSIS — G8929 Other chronic pain: Secondary | ICD-10-CM

## 2017-02-18 DIAGNOSIS — M545 Low back pain: Secondary | ICD-10-CM

## 2017-02-18 DIAGNOSIS — Z981 Arthrodesis status: Secondary | ICD-10-CM

## 2017-02-22 ENCOUNTER — Encounter: Payer: Medicare Other | Attending: Registered Nurse | Admitting: Registered Nurse

## 2017-02-22 ENCOUNTER — Encounter: Payer: Self-pay | Admitting: Registered Nurse

## 2017-02-22 VITALS — BP 105/73 | HR 72

## 2017-02-22 DIAGNOSIS — M961 Postlaminectomy syndrome, not elsewhere classified: Secondary | ICD-10-CM

## 2017-02-22 DIAGNOSIS — J45909 Unspecified asthma, uncomplicated: Secondary | ICD-10-CM | POA: Diagnosis not present

## 2017-02-22 DIAGNOSIS — G8929 Other chronic pain: Secondary | ICD-10-CM | POA: Diagnosis present

## 2017-02-22 DIAGNOSIS — Z5181 Encounter for therapeutic drug level monitoring: Secondary | ICD-10-CM

## 2017-02-22 DIAGNOSIS — G894 Chronic pain syndrome: Secondary | ICD-10-CM | POA: Diagnosis not present

## 2017-02-22 DIAGNOSIS — Z981 Arthrodesis status: Secondary | ICD-10-CM | POA: Insufficient documentation

## 2017-02-22 DIAGNOSIS — Z79899 Other long term (current) drug therapy: Secondary | ICD-10-CM

## 2017-02-22 DIAGNOSIS — G629 Polyneuropathy, unspecified: Secondary | ICD-10-CM | POA: Insufficient documentation

## 2017-02-22 DIAGNOSIS — M533 Sacrococcygeal disorders, not elsewhere classified: Secondary | ICD-10-CM | POA: Diagnosis not present

## 2017-02-22 DIAGNOSIS — M542 Cervicalgia: Secondary | ICD-10-CM | POA: Diagnosis not present

## 2017-02-22 DIAGNOSIS — M5416 Radiculopathy, lumbar region: Secondary | ICD-10-CM | POA: Diagnosis not present

## 2017-02-22 DIAGNOSIS — M797 Fibromyalgia: Secondary | ICD-10-CM | POA: Diagnosis not present

## 2017-02-22 DIAGNOSIS — M1990 Unspecified osteoarthritis, unspecified site: Secondary | ICD-10-CM | POA: Diagnosis not present

## 2017-02-22 DIAGNOSIS — F329 Major depressive disorder, single episode, unspecified: Secondary | ICD-10-CM | POA: Insufficient documentation

## 2017-02-22 DIAGNOSIS — Z87891 Personal history of nicotine dependence: Secondary | ICD-10-CM | POA: Insufficient documentation

## 2017-02-22 DIAGNOSIS — E785 Hyperlipidemia, unspecified: Secondary | ICD-10-CM | POA: Diagnosis not present

## 2017-02-22 MED ORDER — MORPHINE SULFATE ER 30 MG PO TBCR: 30.0000 mg | EXTENDED_RELEASE_TABLET | Freq: Three times a day (TID) | ORAL | 0 refills | Status: DC

## 2017-02-22 NOTE — Progress Notes (Addendum)
Subjective:    Patient ID: Cindy Pratt, female    DOB: April 22, 1966, 51 y.o.   MRN: [id number]  HPI: Cindy Pratt is a [age]year old female who returns for follow up appointmentfor chronic pain and medication refill. She states her pain is located in her neck and lower back radiating into her left buttock and left lower extremity.Also states she has generalized pain all over.She rates her pain 8.Her current exercise regime is walking in the home and performing stretching exercises.   Last UDS was performed on : 12/02/2016, it was consistent.   Pain Inventory Average Pain 8 Pain Right Now 8 My pain is constant, sharp, burning, stabbing, tingling and aching  In the last 24 hours, has pain interfered with the following? General activity 8 Relation with others 8 Enjoyment of life 8 What TIME of day is your pain at its worst? . Sleep (in general) Poor  Pain is worse with: walking Pain improves with: rest and medication Relief from Meds: 5  Mobility Do you have any goals in this area?  no  Function Do you have any goals in this area?  no  Neuro/Psych weakness numbness tremor dizziness confusion depression anxiety  Prior Studies Any changes since last visit?  no  Physicians involved in your care Any changes since last visit?  no   Family History  Problem Relation Age of Onset  . Hypertension Mother   . Hypertension Father   . Breast cancer Maternal Grandmother   . Liver cancer Maternal Grandmother   . Cancer Maternal Grandmother        breast cancer  . Ovarian cancer Cousin   . Cancer Cousin        1st c. maternal side/ cervical cancer  . Cancer Cousin        1st c maternal/ colon cancer  . Diabetes Maternal Grandfather        both sides   Social History   Social History  . Marital status: Married    Spouse name: Velda ShellHubert  . Number of children: 4  . Years of education: M   Occupational History  . disabled Disable   Social History Main Topics    . Smoking status: Former Smoker    Packs/day: 1.00    Years: 20.00    Types: Cigarettes    Quit date: 07/31/2007  . Smokeless tobacco: Never Used  . Alcohol use No  . Drug use: No  . Sexual activity: Yes    Birth control/ protection: Surgical   Other Topics Concern  . Not on file   Social History Narrative   Lives at home with husband Velda ShellHubert   Drinks 4 sodas a day   Past Surgical History:  Procedure Laterality Date  . ABDOMINAL HYSTERECTOMY    . BACK SURGERY  1997, 2003  . CYSTECTOMY    . DIAGNOSTIC LAPAROSCOPY    . WRIST ARTHROPLASTY Left   . WRIST ARTHROSCOPY WITH DEBRIDEMENT Right 11/13/2015   Procedure: RIGHT WRIST ARTHROSCOPY WITH DEBRIDEMENT;  Surgeon: Betha LoaKevin Kuzma, MD;  Location: [facility];  Service: Orthopedics;  Laterality: Right;   Past Medical History:  Diagnosis Date  . Anxiety   . Asthma   . Belchings   . Chronic pain syndrome   . Degenerative joint disease   . Depression   . Disturbance of skin sensation   . Fibromyalgia   . GERD (gastroesophageal reflux disease)   . Hyperlipidemia   . Lumbar post-laminectomy syndrome   .  Lumbosacral neuritis   . Osteoarthritis   . Other chronic postoperative pain   . Sciatica    There were no vitals taken for this visit.  Opioid Risk Score:   Fall Risk Score:  `1  Depression screen PHQ 2/9  Depression screen [facility] 2/9 02/16/2016 10/20/2015 05/22/2015 12/02/2014  Decreased Interest 0 1 1 1   Down, Depressed, Hopeless 0 1 1 1   PHQ - 2 Score 0 2 2 2   Altered sleeping - - - 1  Tired, decreased energy - - - 1  Change in appetite - - - 0  Feeling bad or failure about yourself  - - - 0  Trouble concentrating - - - 1  Moving slowly or fidgety/restless - - - 0  Suicidal thoughts - - - 0  PHQ-9 Score - - - 5     Review of Systems  Constitutional: Negative.   HENT: Negative.   Eyes: Negative.   Respiratory: Negative.   Cardiovascular: Negative.   Gastrointestinal: Negative.   Endocrine: Negative.    Genitourinary: Negative.   Musculoskeletal: Negative.   Skin: Negative.   Allergic/Immunologic: Negative.   Neurological: Negative.   Hematological: Negative.   Psychiatric/Behavioral: Negative.   All other systems reviewed and are negative.      Objective:   Physical Exam  Constitutional: She appears well-developed and well-nourished.  HENT:  Head: Normocephalic and atraumatic.  Neck: Normal range of motion. Neck supple.  Cardiovascular: Normal rate.   Musculoskeletal:  Normal Muscle Bulk and Muscle Testing Reveals: Upper Extremities: Full ROM and Muscle Strength 5/5 Right AC Joint Tenderness Thoracic Paraspinal Tenderness: T-1-T-3 T-7-T-9 Lumbar Paraspinal Tenderness: L-3-L-5 Lower Extremities: Full ROM and Muscle Strength 5/5 Arises from Table with ease Narrow Based Gait  Nursing note and vitals reviewed.         Assessment & Plan:  1.Lumbar postlaminectomy syndrome status post L5-S1 fusion radiating to LLE: 02/22/2017 Refilled: MS contin 30 mg # 90 pills--use one pill every 8 hours for pain and ContinueTramadol 50 mg BID. #60. We will continue the opioid monitoring program, this consists of regular clinic visits, examinations, urine drug screen, pill counts as well as use of [place] Controlled Substance reporting System. 2. Depression: Continue Current Medication Regimen of Prozac, Latuda and Wellbutrin. PCP following. 02/22/2017 3. Tremors: No complaints: Neurology Following : Dr. Oswaldo Conroy. 02/22/2017 4. Muscle Spasm: Continue Tizanidine. 02/22/2017 5. Sacroiliac Joint Dysfunction: Continue to monitor.02/22/2017  20 minutes of face to face patient care time was spent during this visit. All questions were encouraged and answered.   F/U in 1 month

## 2017-02-27 LAB — 6-ACETYLMORPHINE,TOXASSURE ADD
6-ACETYLMORPHINE: NEGATIVE
6-acetylmorphine: NOT DETECTED ng/mg creat

## 2017-02-27 LAB — TOXASSURE SELECT,+ANTIDEPR,UR

## 2017-03-01 ENCOUNTER — Ambulatory Visit
Admission: RE | Admit: 2017-03-01 | Discharge: 2017-03-01 | Disposition: A | Discharge status: Home / Self Care | Payer: Medicare Other | Source: Ambulatory Visit | Attending: Orthopedic Surgery | Admitting: Orthopedic Surgery

## 2017-03-01 DIAGNOSIS — M545 Low back pain, unspecified: Secondary | ICD-10-CM

## 2017-03-01 DIAGNOSIS — G8929 Other chronic pain: Secondary | ICD-10-CM | POA: Insufficient documentation

## 2017-03-01 DIAGNOSIS — M5136 Other intervertebral disc degeneration, lumbar region: Secondary | ICD-10-CM | POA: Insufficient documentation

## 2017-03-01 DIAGNOSIS — Z981 Arthrodesis status: Secondary | ICD-10-CM | POA: Diagnosis not present

## 2017-03-01 DIAGNOSIS — M5416 Radiculopathy, lumbar region: Secondary | ICD-10-CM

## 2017-03-01 MED ORDER — GADOBENATE DIMEGLUMINE 529 MG/ML IV SOLN
15.0000 mL | Freq: Once | INTRAVENOUS | Status: AC | PRN
  Administered 2017-03-01: 15 mL via INTRAVENOUS

## 2017-03-09 ENCOUNTER — Telehealth: Payer: Self-pay | Admitting: *Deleted

## 2017-03-09 NOTE — Telephone Encounter (Signed)
Urine drug screen for this encounter is consistent for prescribed medication. We are not prescribing bupropion.

## 2017-03-18 ENCOUNTER — Encounter (HOSPITAL_COMMUNITY): Payer: Self-pay | Admitting: *Deleted

## 2017-03-18 ENCOUNTER — Emergency Department (HOSPITAL_COMMUNITY): Payer: Medicare Other

## 2017-03-18 ENCOUNTER — Emergency Department (HOSPITAL_COMMUNITY)
Admission: EM | Admit: 2017-03-18 | Discharge: 2017-03-19 | Disposition: A | Discharge status: Home / Self Care | Payer: Medicare Other | Attending: Emergency Medicine | Admitting: Emergency Medicine

## 2017-03-18 DIAGNOSIS — J45909 Unspecified asthma, uncomplicated: Secondary | ICD-10-CM | POA: Diagnosis not present

## 2017-03-18 DIAGNOSIS — E119 Type 2 diabetes mellitus without complications: Secondary | ICD-10-CM | POA: Insufficient documentation

## 2017-03-18 DIAGNOSIS — F329 Major depressive disorder, single episode, unspecified: Secondary | ICD-10-CM | POA: Insufficient documentation

## 2017-03-18 DIAGNOSIS — R51 Headache: Secondary | ICD-10-CM | POA: Diagnosis not present

## 2017-03-18 DIAGNOSIS — Z79899 Other long term (current) drug therapy: Secondary | ICD-10-CM | POA: Diagnosis not present

## 2017-03-18 DIAGNOSIS — Z87891 Personal history of nicotine dependence: Secondary | ICD-10-CM | POA: Diagnosis not present

## 2017-03-18 DIAGNOSIS — F419 Anxiety disorder, unspecified: Secondary | ICD-10-CM | POA: Insufficient documentation

## 2017-03-18 DIAGNOSIS — R519 Headache, unspecified: Secondary | ICD-10-CM

## 2017-03-18 LAB — CBC WITH DIFFERENTIAL/PLATELET
BASOS ABS: 0 10*3/uL (ref 0.0–0.1)
BASOS PCT: 1 %
EOS ABS: 0.2 10*3/uL (ref 0.0–0.7)
EOS PCT: 2 %
HCT: 38.9 % (ref 36.0–46.0)
Hemoglobin: 12.6 g/dL (ref 12.0–15.0)
Lymphocytes Relative: 38 %
Lymphs Abs: 2.4 10*3/uL (ref 0.7–4.0)
MCH: 29 pg (ref 26.0–34.0)
MCHC: 32.4 g/dL (ref 30.0–36.0)
MCV: 89.4 fL (ref 78.0–100.0)
MONO ABS: 0.7 10*3/uL (ref 0.1–1.0)
Monocytes Relative: 11 %
Neutro Abs: 3.1 10*3/uL (ref 1.7–7.7)
Neutrophils Relative %: 48 %
PLATELETS: 241 10*3/uL (ref 150–400)
RBC: 4.35 MIL/uL (ref 3.87–5.11)
RDW: 12.9 % (ref 11.5–15.5)
WBC: 6.4 10*3/uL (ref 4.0–10.5)

## 2017-03-18 LAB — BASIC METABOLIC PANEL
ANION GAP: 8 (ref 5–15)
BUN: 9 mg/dL (ref 6–20)
CALCIUM: 9.2 mg/dL (ref 8.9–10.3)
CO2: 29 mmol/L (ref 22–32)
Chloride: 103 mmol/L (ref 101–111)
Creatinine, Ser: 0.94 mg/dL (ref 0.44–1.00)
GLUCOSE: 102 mg/dL — AB (ref 65–99)
Potassium: 3.9 mmol/L (ref 3.5–5.1)
SODIUM: 140 mmol/L (ref 135–145)

## 2017-03-18 NOTE — Discharge Instructions (Signed)
Recommend to follow-up with neurology if these continue having headaches and other associated symptoms. May also wish to follow-up with your primary care doctor. Return to the ED for new or worsening symptoms.

## 2017-03-18 NOTE — ED Provider Notes (Signed)
[facility]-EMERGENCY DEPT Provider Note   CSN: [id number] Arrival date & time: 03/18/17  1818     History   Chief Complaint Chief Complaint  Patient presents with  . Headache    HPI Birdena JubileeLori A Mitschke is a 51 y.o. female.  The history is provided by the patient and medical records.  Headache      51 year old female with history of anxiety, asthma, chronic pain, depression, GERD, hyperlipidemia, ostia arthritis, sciatica, presenting to the ED with complaint of frequent headaches over the past few months. Patient states a few months ago she noticed a "ridge" on her scalp. States the area seems to be getting larger and tracking to the posterior portion of her scalp. States area is mildly tender and she has noticed increased frequency of headaches along this area. States headaches are usually throbbing in nature. Her husband at bedside has also mentioned that patient has been slightly more forgetful-- like forgetting where her cell phone is, the remote control, or her car keys. Patient reports sometimes she feels like her words are "jumbled up" and she sometimes feels unsteady on her feet. She does have chronic back pain so unsure if this is contributing. She is currently enrolled in pain management. She denies any new focal numbness or weakness. States she does wear reading glasses and her vision is occasionally blurred.  She has no history of TIA or stroke. She is not currently on anticoagulation. States she does follow-up with her doctor regularly, but usually more related to her chronic issues and she has not mentioned these symptoms to her primary care doctor yet.  Past Medical History:  Diagnosis Date  . Anxiety   . Asthma   . Belchings   . Chronic pain syndrome   . Degenerative joint disease   . Depression   . Disturbance of skin sensation   . Fibromyalgia   . GERD (gastroesophageal reflux disease)   . Hyperlipidemia   . Lumbar post-laminectomy syndrome   . Lumbosacral neuritis   .  Osteoarthritis   . Other chronic postoperative pain   . Sciatica     Patient Active Problem List   Diagnosis Date Noted  . Postlaminectomy syndrome, lumbar region 10/21/2011  . Thoracic or lumbosacral neuritis or radiculitis, unspecified 10/21/2011  . Chronic pain syndrome 10/21/2011  . Sternoclavicular joint disorder 10/21/2011  . DIABETES MELLITUS, TYPE II 08/14/2009  . FLANK PAIN, RIGHT 08/05/2009  . HYPERHIDROSIS 05/26/2009  . SWELLING, MASS, OR LUMP IN CHEST 05/26/2009  . ABDOMINAL TENDERNESS, RIGHT UPPER QUADRANT 05/26/2009  . BACK PAIN 02/18/2009  . ANXIETY 07/22/2008  . DEPRESSION 07/22/2008  . PERIPHERAL NEUROPATHY 07/22/2008  . ALLERGIC RHINITIS 07/22/2008  . ASTHMA 07/22/2008  . LOW BACK PAIN 07/22/2008  . Fibromyalgia syndrome 07/22/2008  . Nonspecific (abnormal) findings on radiological and other examination of body structure 07/22/2008  . NEPHROLITHIASIS, HX OF 07/22/2008  . CHEST XRAY, ABNORMAL 07/22/2008    Past Surgical History:  Procedure Laterality Date  . ABDOMINAL HYSTERECTOMY    . BACK SURGERY  1997, 2003  . CYSTECTOMY    . DIAGNOSTIC LAPAROSCOPY    . WRIST ARTHROPLASTY Left   . WRIST ARTHROSCOPY WITH DEBRIDEMENT Right 11/13/2015   Procedure: RIGHT WRIST ARTHROSCOPY WITH DEBRIDEMENT;  Surgeon: Betha LoaKevin Kuzma, MD;  Location: [facility];  Service: Orthopedics;  Laterality: Right;    OB History    No data available       Home Medications    Prior to Admission medications   Medication  Sig Start Date End Date Taking? Authorizing Provider  albuterol (PROAIR HFA) 108 (90 BASE) MCG/ACT inhaler Inhale 1-2 puffs into the lungs every 4 (four) hours as needed for wheezing or shortness of breath.  02/24/15 02/13/17  [provider]  B Complex-Folic Acid (SUPER B COMPLEX MAXI PO) Take 1 tablet by mouth daily.    [provider]  buPROPion (WELLBUTRIN SR) 150 MG 12 hr tablet Take 150 mg by mouth 2 (two) times daily.    [provider]  cetirizine (ZYRTEC) 10 MG tablet Take 10 mg by mouth daily.    [provider]  FLUoxetine HCl 60 MG TABS Take 60 mg by mouth daily. 03/28/15   [provider]  lurasidone (LATUDA) 40 MG TABS tablet Take 40 mg by mouth at bedtime.     [provider]  morphine (MS CONTIN) 30 MG 12 hr tablet Take 1 tablet (30 mg total) by mouth every 8 (eight) hours. 02/22/17   Jones Bales, NP  Multiple Vitamin (MULTIVITAMIN WITH MINERALS) TABS tablet Take 1 tablet by mouth daily.    [provider]  tiZANidine (ZANAFLEX) 4 MG tablet Take 1 tablet (4 mg total) by mouth 2 (two) times daily. 10/29/16   Jones Bales, NP  traMADol (ULTRAM) 50 MG tablet Take 1 tablet (50 mg total) by mouth 2 (two) times daily as needed. 12/28/16   Kirsteins, Victorino Sparrow, MD    Family History Family History  Problem Relation Age of Onset  . Hypertension Mother   . Hypertension Father   . Breast cancer Maternal Grandmother   . Liver cancer Maternal Grandmother   . Cancer Maternal Grandmother        breast cancer  . Ovarian cancer Cousin   . Cancer Cousin        1st c. maternal side/ cervical cancer  . Cancer Cousin        1st c maternal/ colon cancer  . Diabetes Maternal Grandfather        both sides    Social History Social History  Substance Use Topics  . Smoking status: Former Smoker    Packs/day: 1.00    Years: 20.00    Types: Cigarettes    Quit date: 07/31/2007  . Smokeless tobacco: Never Used  . Alcohol use No     Allergies   Aspirin; Nsaids; Sulfa antibiotics; Sulfonamide derivatives; Tolmetin; Cymbalta [duloxetine hcl]; Duloxetine; Gabapentin; Other; and Pregabalin   Review of Systems Review of Systems  Neurological: Positive for headaches.  All other systems reviewed and are negative.    Physical Exam Updated Vital Signs BP 128/84 (BP Location: Right Arm)   Pulse 70   Temp 98.2 F (36.8 C) (Oral)   Resp 16   Ht 5' 3.5" (1.613 m)   Wt 74.8  kg (165 lb)   SpO2 98%   BMI 28.77 kg/m   Physical Exam  Constitutional: She is oriented to person, place, and time. She appears well-developed and well-nourished.  HENT:  Head: Normocephalic and atraumatic.  Mouth/Throat: Oropharynx is clear and moist.  Along anterior central scalp there is a small ridge-like raised area extending about 2-3 cm towards posterior scalp; this falls where her natural hair part is; there is no overlying erythema, warmth to touch, or significant tenderness; no evidence of abscess formation or rash  Eyes: Pupils are equal, round, and reactive to light. Conjunctivae and EOM are normal.  Neck: Normal range of motion.  Cardiovascular: Normal rate, regular rhythm  and normal heart sounds.   Pulmonary/Chest: Effort normal and breath sounds normal.  Abdominal: Soft. Bowel sounds are normal.  Musculoskeletal: Normal range of motion.  Neurological: She is alert and oriented to person, place, and time.  AAOx3, answering questions and following commands appropriately; equal strength UE and LE bilaterally; CN grossly intact; moves all extremities appropriately without ataxia; normal rapid alternating movements; no focal neuro deficits or facial asymmetry appreciated; speech is clear and goal oriented, no apparent word finding difficulties witnessed here, gait is steady  Skin: Skin is warm and dry.  Psychiatric: She has a normal mood and affect.  Nursing note and vitals reviewed.    ED Treatments / Results  Labs (all labs ordered are listed, but only abnormal results are displayed) Labs Reviewed  BASIC METABOLIC PANEL - Abnormal; Notable for the following:       Result Value   Glucose, Bld 102 (*)    All other components within normal limits  CBC WITH DIFFERENTIAL/PLATELET    EKG  EKG Interpretation None       Radiology Ct Head Wo Contrast  Result Date: 03/18/2017 CLINICAL DATA:  Persistent headache and dizziness with growing "knot" spreading to posterior  head. EXAM: CT HEAD WITHOUT CONTRAST TECHNIQUE: Contiguous axial images were obtained from the base of the skull through the vertex without intravenous contrast. COMPARISON:  03/08/2016 FINDINGS: Brain: No evidence of acute infarction, hemorrhage, hydrocephalus, extra-axial collection or mass lesion/mass effect. Vascular: No hyperdense vessel or unexpected calcification. Skull: Intact bony calvarium without focal lesion or fracture. Sinuses/Orbits: No acute finding. Other: None. IMPRESSION: Stable appearance of the brain without acute intracranial abnormality. Electronically Signed   By: Tollie Eth M.D.   On: 03/18/2017 22:05    Procedures Procedures (including critical care time)  Medications Ordered in ED Medications - No data to display   Initial Impression / Assessment and Plan / ED Course  I have reviewed the triage vital signs and the nursing notes.  Pertinent labs & imaging results that were available during my care of the patient were reviewed by me and considered in my medical decision making (see chart for details).  51 y.o. F here with frequent headaches and other symptoms of forgetfulness, intermittent jumbling words, etc.  Symptoms ongoing for several months.  Not a code stroke.  Neurologically intact here.  Speech clear, no apparent word finding difficulties noted on my exam.  Gait normal.  No signs/symptoms concerning for meningitis.  Will plan for basic labs, CT head.  Labs reassuring.  CT head negative for acute findings.  No skin abnormalities noted on head CT.  At this time, do not feel patient needs further emergent work-up, rather can be done as an outpatient.  Will refer to neurology.  Recommended that she follow-up with her PCP as well.  Discussed plan with patient, she acknowledged understanding and agreed with plan of care.  Return precautions given for new or worsening symptoms.  Final Clinical Impressions(s) / ED Diagnoses   Final diagnoses:  Frequent headaches     New Prescriptions Discharge Medication List as of 03/18/2017 11:44 PM       Garlon Hatchet, PA-C 03/19/17 0020    Cathren Laine, MD 03/21/17 [phone]

## 2017-03-18 NOTE — ED Notes (Signed)
ED Provider at bedside. 

## 2017-03-18 NOTE — ED Triage Notes (Signed)
Pt reports having headaches and a "knot on top of her head" for several months. Pt feels like knot is getting bigger and expanding. Denies n/v. No acute distress is noted at triage.

## 2017-03-18 NOTE — ED Notes (Signed)
Patient transported to CT 

## 2017-03-19 NOTE — ED Notes (Signed)
Pt departed in NAD, refused use of wheelchair.  

## 2017-03-24 ENCOUNTER — Encounter: Payer: Medicare Other | Attending: Registered Nurse | Admitting: Registered Nurse

## 2017-03-24 ENCOUNTER — Encounter: Payer: Self-pay | Admitting: Registered Nurse

## 2017-03-24 VITALS — BP 102/70 | HR 74

## 2017-03-24 DIAGNOSIS — M1990 Unspecified osteoarthritis, unspecified site: Secondary | ICD-10-CM | POA: Insufficient documentation

## 2017-03-24 DIAGNOSIS — G629 Polyneuropathy, unspecified: Secondary | ICD-10-CM | POA: Insufficient documentation

## 2017-03-24 DIAGNOSIS — M797 Fibromyalgia: Secondary | ICD-10-CM | POA: Diagnosis not present

## 2017-03-24 DIAGNOSIS — Z87891 Personal history of nicotine dependence: Secondary | ICD-10-CM | POA: Diagnosis not present

## 2017-03-24 DIAGNOSIS — Z79899 Other long term (current) drug therapy: Secondary | ICD-10-CM | POA: Diagnosis not present

## 2017-03-24 DIAGNOSIS — Z981 Arthrodesis status: Secondary | ICD-10-CM | POA: Insufficient documentation

## 2017-03-24 DIAGNOSIS — F329 Major depressive disorder, single episode, unspecified: Secondary | ICD-10-CM | POA: Insufficient documentation

## 2017-03-24 DIAGNOSIS — Z5181 Encounter for therapeutic drug level monitoring: Secondary | ICD-10-CM | POA: Diagnosis not present

## 2017-03-24 DIAGNOSIS — M5416 Radiculopathy, lumbar region: Secondary | ICD-10-CM | POA: Diagnosis not present

## 2017-03-24 DIAGNOSIS — G894 Chronic pain syndrome: Secondary | ICD-10-CM

## 2017-03-24 DIAGNOSIS — J45909 Unspecified asthma, uncomplicated: Secondary | ICD-10-CM | POA: Insufficient documentation

## 2017-03-24 DIAGNOSIS — M961 Postlaminectomy syndrome, not elsewhere classified: Secondary | ICD-10-CM | POA: Diagnosis not present

## 2017-03-24 DIAGNOSIS — E785 Hyperlipidemia, unspecified: Secondary | ICD-10-CM | POA: Diagnosis not present

## 2017-03-24 DIAGNOSIS — M533 Sacrococcygeal disorders, not elsewhere classified: Secondary | ICD-10-CM | POA: Diagnosis not present

## 2017-03-24 DIAGNOSIS — G8929 Other chronic pain: Secondary | ICD-10-CM | POA: Diagnosis present

## 2017-03-24 MED ORDER — TRAMADOL HCL 50 MG PO TABS: 50.0000 mg | ORAL_TABLET | Freq: Two times a day (BID) | ORAL | 2 refills | Status: DC | PRN

## 2017-03-24 MED ORDER — MORPHINE SULFATE ER 30 MG PO TBCR: 30.0000 mg | EXTENDED_RELEASE_TABLET | Freq: Three times a day (TID) | ORAL | 0 refills | Status: DC

## 2017-03-24 NOTE — Progress Notes (Signed)
Subjective:    Patient ID: Cindy Pratt, female    DOB: Aug 29, 1966, 51 y.o.   MRN: [id number]  HPI: Ms. Cindy JubileeLori A Pratt is a 51year old female who returns for follow up appointmentfor chronic pain and medication refill. She states her pain is located in her neck and lower back radiating into her left buttock and left lower extremity.Also states she has generalized pain all over.She rates her pain 8.Her current exercise regime is walking in the home.  [name], she has an appointment to follow up with Dr. Danae OrleansPenumali.   Last UDS was performed on : 02/22/2017, it was consistent.    Pain Inventory Average Pain 9 Pain Right Now 8 My pain is all  In the last 24 hours, has pain interfered with the following? General activity 8 Relation with others 8 Enjoyment of life 8 What TIME of day is your pain at its worst? all Sleep (in general) Fair  Pain is worse with: . Pain improves with: medication Relief from Meds: 5  Mobility walk without assistance  Function Do you have any goals in this area?  no  Neuro/Psych weakness numbness tremor tingling spasms dizziness confusion depression anxiety loss of taste or smell  Prior Studies Any changes since last visit?  no  Physicians involved in your care Any changes since last visit?  no   Family History  Problem Relation Age of Onset  . Hypertension Mother   . Hypertension Father   . Breast cancer Maternal Grandmother   . Liver cancer Maternal Grandmother   . Cancer Maternal Grandmother        breast cancer  . Ovarian cancer Cousin   . Cancer Cousin        1st c. maternal side/ cervical cancer  . Cancer Cousin        1st c maternal/ colon cancer  . Diabetes Maternal Grandfather        both sides   Social History   Social History  . Marital status: Married    Spouse name: Cindy Pratt  . Number of children: 4  . Years of education: M   Occupational History  .  disabled Disable   Social History Main Topics  . Smoking status: Former Smoker    Packs/day: 1.00    Years: 20.00    Types: Cigarettes    Quit date: 07/31/2007  . Smokeless tobacco: Never Used  . Alcohol use No  . Drug use: No  . Sexual activity: Yes    Birth control/ protection: Surgical   Other Topics Concern  . Not on file   Social History Narrative   Lives at home with husband Cindy Pratt   Drinks 4 sodas a day   Past Surgical History:  Procedure Laterality Date  . ABDOMINAL HYSTERECTOMY    . BACK SURGERY  1997, 2003  . CYSTECTOMY    . DIAGNOSTIC LAPAROSCOPY    . WRIST ARTHROPLASTY Left   . WRIST ARTHROSCOPY WITH DEBRIDEMENT Right 11/13/2015   Procedure: RIGHT WRIST ARTHROSCOPY WITH DEBRIDEMENT;  Surgeon: Betha LoaKevin Kuzma, MD;  Location: [facility];  Service: Orthopedics;  Laterality: Right;   Past Medical History:  Diagnosis Date  . Anxiety   . Asthma   . Belchings   . Chronic pain syndrome   . Degenerative joint disease   . Depression   . Disturbance of skin sensation   . Fibromyalgia   . GERD (gastroesophageal reflux disease)   .  Hyperlipidemia   . Lumbar post-laminectomy syndrome   . Lumbosacral neuritis   . Osteoarthritis   . Other chronic postoperative pain   . Sciatica    There were no vitals taken for this visit.  Opioid Risk Score:   Fall Risk Score:  `1  Depression screen PHQ 2/9  [facility] 2/9 02/16/2016 10/20/2015 05/22/2015 12/02/2014  Decreased Interest 0 1 1 1   Down, Depressed, Hopeless 0 1 1 1   PHQ - 2 Score 0 2 2 2   Altered sleeping - - - 1  Tired, decreased energy - - - 1  Change in appetite - - - 0  Feeling bad or failure about yourself  - - - 0  Trouble concentrating - - - 1  Moving slowly or fidgety/restless - - - 0  Suicidal thoughts - - - 0  PHQ-9 Score - - - 5     Review of Systems  Constitutional: Negative.   HENT: Negative.   Eyes: Negative.   Respiratory: Negative.   Cardiovascular: Negative.     Gastrointestinal: Negative.   Endocrine: Negative.   Genitourinary: Negative.   Musculoskeletal: Negative.   Skin: Negative.   Allergic/Immunologic: Negative.   Neurological: Negative.   Hematological: Negative.   Psychiatric/Behavioral: Negative.   All other systems reviewed and are negative.      Objective:   Physical Exam  Constitutional: She is oriented to person, place, and time. She appears well-developed and well-nourished.  HENT:  Head: Normocephalic and atraumatic.  Neck: Normal range of motion. Neck supple.  Cardiovascular: Normal rate and regular rhythm.   Pulmonary/Chest: Effort normal and breath sounds normal.  Musculoskeletal:  Normal Muscle Bulk and Muscle Testing Reveals: Upper Extremities: Full ROM and Muscle Strength 5/5 Back without spinal tenderness Lower Extremities: Full ROM and Muscle Strength 5/5 Arises from Table with ease Narrow Based Gait  Neurological: She is alert and oriented to person, place, and time.  Skin: Skin is warm and dry.  Psychiatric: She has a normal mood and affect.  Nursing note and vitals reviewed.         Assessment & Plan:  1.Lumbar postlaminectomy syndrome status post L5-S1 fusion radiating to LLE: 03/24/2017 Refilled: MS contin 30 mg # 90 pills--use one pill every 8 hours for pain and ContinueTramadol 50 mg BID. #60. We will continue the opioid monitoring program, this consists of regular clinic visits, examinations, urine drug screen, pill counts as well as use of [place] Controlled Substance reporting System. 2. Depression: Continue Current Medication Regimen of Prozac, Latuda and Wellbutrin. PCP following. 03/24/2017 3. Muscle Spasm: Continue Tizanidine. 03/24/2017 4. Sacroiliac Joint Dysfunction: Continue to monitor.03/24/2017  20 minutes of face to face patient care time was spent during this visit. All questions were encouraged and answered.   F/U in 1 month

## 2017-04-18 ENCOUNTER — Telehealth: Payer: Self-pay | Admitting: *Deleted

## 2017-04-18 MED ORDER — LUBIPROSTONE 8 MCG PO CAPS: 8.0000 ug | ORAL_CAPSULE | Freq: Every day | ORAL | 0 refills | Status: DC

## 2017-04-18 NOTE — Telephone Encounter (Addendum)
Cindy Pratt called and said she is having issues with constipation.  She has tried to manage on her own but is having continuing difficulty and is asking for something to be sent to pharmacy. It looks like Amitiza and Linzess are on the formulary. She has tried fiber and miralax and psyllium. Please advise.

## 2017-04-18 NOTE — Telephone Encounter (Signed)
Per Riley Lam I placed an order for Amitiza 8 mcg daily #30 0 RF.  I spoke with pharmacy and it ran through without problem. Ferol is aware that it was being sent to pharmacy.

## 2017-04-21 ENCOUNTER — Encounter: Payer: Self-pay | Admitting: Registered Nurse

## 2017-04-21 ENCOUNTER — Encounter: Payer: Medicare Other | Attending: Registered Nurse | Admitting: Registered Nurse

## 2017-04-21 VITALS — BP 107/75 | HR 69

## 2017-04-21 DIAGNOSIS — J45909 Unspecified asthma, uncomplicated: Secondary | ICD-10-CM | POA: Insufficient documentation

## 2017-04-21 DIAGNOSIS — M961 Postlaminectomy syndrome, not elsewhere classified: Secondary | ICD-10-CM

## 2017-04-21 DIAGNOSIS — G629 Polyneuropathy, unspecified: Secondary | ICD-10-CM | POA: Diagnosis not present

## 2017-04-21 DIAGNOSIS — M5416 Radiculopathy, lumbar region: Secondary | ICD-10-CM

## 2017-04-21 DIAGNOSIS — G894 Chronic pain syndrome: Secondary | ICD-10-CM

## 2017-04-21 DIAGNOSIS — M1990 Unspecified osteoarthritis, unspecified site: Secondary | ICD-10-CM | POA: Diagnosis not present

## 2017-04-21 DIAGNOSIS — E785 Hyperlipidemia, unspecified: Secondary | ICD-10-CM | POA: Diagnosis not present

## 2017-04-21 DIAGNOSIS — F329 Major depressive disorder, single episode, unspecified: Secondary | ICD-10-CM | POA: Diagnosis not present

## 2017-04-21 DIAGNOSIS — M797 Fibromyalgia: Secondary | ICD-10-CM | POA: Diagnosis not present

## 2017-04-21 DIAGNOSIS — Z981 Arthrodesis status: Secondary | ICD-10-CM | POA: Insufficient documentation

## 2017-04-21 DIAGNOSIS — Z79899 Other long term (current) drug therapy: Secondary | ICD-10-CM | POA: Diagnosis not present

## 2017-04-21 DIAGNOSIS — Z87891 Personal history of nicotine dependence: Secondary | ICD-10-CM | POA: Insufficient documentation

## 2017-04-21 DIAGNOSIS — G8929 Other chronic pain: Secondary | ICD-10-CM | POA: Diagnosis present

## 2017-04-21 DIAGNOSIS — Z5181 Encounter for therapeutic drug level monitoring: Secondary | ICD-10-CM | POA: Diagnosis not present

## 2017-04-21 MED ORDER — MORPHINE SULFATE ER 30 MG PO TBCR: 30.0000 mg | EXTENDED_RELEASE_TABLET | Freq: Three times a day (TID) | ORAL | 0 refills | Status: DC

## 2017-04-21 NOTE — Progress Notes (Signed)
Subjective:    Patient ID: Cindy Pratt, female    DOB: 07/01/1966, 51 y.o.   MRN: [id number]  HPI: Cindy Pratt is a 51year old female who returns for follow up appointmentfor chronic pain and medication refill. She states her pain is located in her neck and lower back radiating into her left buttock and left lower extremity.Also states she has generalized pain all over.She rates her pain 9.Her current exercise regime is walking in the home.  Cindy Pratt stated she went to the [facility] and was told the Psychiatrist may not be able to see her due to she is prescribed analgesics. Placed a call to the [facility] for clarification, awaiting a return call.   Last UDS was performed on : 02/22/2017, it was consistent.    Pain Inventory Average Pain 8 Pain Right Now 9 My pain is constant, sharp, burning, stabbing, tingling and aching  In the last 24 hours, has pain interfered with the following? General activity 9 Relation with others 7 Enjoyment of life 9 What TIME of day is your pain at its worst? all Sleep (in general) Fair  Pain is worse with: . Pain improves with: . Relief from Meds: .  Mobility Do you have any goals in this area?  no  Function Do you have any goals in this area?  no  Neuro/Psych weakness numbness tremor tingling dizziness confusion depression anxiety  Prior Studies Any changes since last visit?  no  Physicians involved in your care Any changes since last visit?  no   Family History  Problem Relation Age of Onset  . Hypertension Mother   . Hypertension Father   . Breast cancer Maternal Grandmother   . Liver cancer Maternal Grandmother   . Cancer Maternal Grandmother        breast cancer  . Ovarian cancer Cousin   . Cancer Cousin        1st c. maternal side/ cervical cancer  . Cancer Cousin        1st c maternal/ colon cancer  . Diabetes Maternal Grandfather        both sides   Social History   Social History    . Marital status: Married    Spouse name: [name]  . Number of children: 4  . Years of education: M   Occupational History  . disabled Disable   Social History Main Topics  . Smoking status: Former Smoker    Packs/day: 1.00    Years: 20.00    Types: Cigarettes    Quit date: 07/31/2007  . Smokeless tobacco: Never Used  . Alcohol use No  . Drug use: No  . Sexual activity: Yes    Birth control/ protection: Surgical   Other Topics Concern  . Not on file   Social History Narrative   Lives at home with husband [name]   Drinks 4 sodas a day   Past Surgical History:  Procedure Laterality Date  . ABDOMINAL HYSTERECTOMY    . BACK SURGERY  1997, 2003  . CYSTECTOMY    . DIAGNOSTIC LAPAROSCOPY    . WRIST ARTHROPLASTY Left   . WRIST ARTHROSCOPY WITH DEBRIDEMENT Right 11/13/2015   Procedure: RIGHT WRIST ARTHROSCOPY WITH DEBRIDEMENT;  Surgeon: Betha Loa, MD;  Location: [facility];  Service: Orthopedics;  Laterality: Right;   Past Medical History:  Diagnosis Date  . Anxiety   . Asthma   . Belchings   . Chronic pain syndrome   .  Degenerative joint disease   . Depression   . Disturbance of skin sensation   . Fibromyalgia   . GERD (gastroesophageal reflux disease)   . Hyperlipidemia   . Lumbar post-laminectomy syndrome   . Lumbosacral neuritis   . Osteoarthritis   . Other chronic postoperative pain   . Sciatica    There were no vitals taken for this visit.  Opioid Risk Score:   Fall Risk Score:  `1  Depression screen PHQ 2/9  Depression screen [facility] 2/9 02/16/2016 10/20/2015 05/22/2015 12/02/2014  Decreased Interest 0 1 1 1   Down, Depressed, Hopeless 0 1 1 1   PHQ - 2 Score 0 2 2 2   Altered sleeping - - - 1  Tired, decreased energy - - - 1  Change in appetite - - - 0  Feeling bad or failure about yourself  - - - 0  Trouble concentrating - - - 1  Moving slowly or fidgety/restless - - - 0  Suicidal thoughts - - - 0  PHQ-9 Score - - - 5     Review of  Systems  Constitutional: Negative.   HENT: Negative.   Eyes: Negative.   Respiratory: Negative.   Cardiovascular: Negative.   Gastrointestinal: Negative.   Endocrine: Negative.   Genitourinary: Negative.   Musculoskeletal: Negative.   Skin: Negative.   Allergic/Immunologic: Negative.   Neurological: Negative.   Hematological: Negative.   Psychiatric/Behavioral: Negative.   All other systems reviewed and are negative.      Objective:   Physical Exam  Constitutional: She is oriented to person, place, and time. She appears well-developed and well-nourished.  HENT:  Head: Normocephalic and atraumatic.  Neck: Normal range of motion. Neck supple.  Cardiovascular: Normal rate and regular rhythm.   Pulmonary/Chest: Effort normal and breath sounds normal.  Musculoskeletal:  Normal Muscle Bulk and Muscle Testing Reveals: Upper Extremities: Full ROM and Muscle Strength 5/5 Thoracic Paraspinal tenderness: T-7-T-9 Lumbar Paraspinal Tenderness: L-3- L-5 Lower Extremities: Full ROM and Muscle Strength 5/5 Arises from Table with ease Narrow Based Gait  Neurological: She is alert and oriented to person, place, and time.  Skin: Skin is warm and dry.  Psychiatric: She has a normal mood and affect.  Nursing note and vitals reviewed.         Assessment & Plan:  1.Lumbar postlaminectomy syndrome status post L5-S1 fusion radiating to LLE: 04/21/2017 Refilled: MS contin 30 mg # 90 pills--use one pill every 8 hours for pain and ContinueTramadol 50 mg BID. #60. We will continue the opioid monitoring program, this consists of regular clinic visits, examinations, urine drug screen, pill counts as well as use of [place] Controlled Substance reporting System. 2. Depression: Continue Current Medication Regimen of Prozac, Latuda and Wellbutrin. PCP following. 04/21/2017 3. Muscle Spasm: Continue Tizanidine. 04/21/2017 4. Sacroiliac Joint Dysfunction: Continue to monitor.04/21/2017  20  minutes of face to face patient care time was spent during this visit. All questions were encouraged and answered.  F/U in 1 month

## 2017-05-04 ENCOUNTER — Other Ambulatory Visit: Payer: Self-pay

## 2017-05-04 MED ORDER — TIZANIDINE HCL 4 MG PO TABS: 4.0000 mg | ORAL_TABLET | Freq: Two times a day (BID) | ORAL | 5 refills | Status: DC

## 2017-05-09 ENCOUNTER — Encounter: Payer: Self-pay | Admitting: Diagnostic Neuroimaging

## 2017-05-09 ENCOUNTER — Ambulatory Visit (INDEPENDENT_AMBULATORY_CARE_PROVIDER_SITE_OTHER): Payer: Medicare Other | Admitting: Diagnostic Neuroimaging

## 2017-05-09 VITALS — BP 100/70 | HR 79 | Ht 63.5 in | Wt 157.8 lb

## 2017-05-09 DIAGNOSIS — G43009 Migraine without aura, not intractable, without status migrainosus: Secondary | ICD-10-CM

## 2017-05-09 DIAGNOSIS — L989 Disorder of the skin and subcutaneous tissue, unspecified: Secondary | ICD-10-CM | POA: Diagnosis not present

## 2017-05-09 DIAGNOSIS — R413 Other amnesia: Secondary | ICD-10-CM | POA: Diagnosis not present

## 2017-05-09 NOTE — Patient Instructions (Addendum)
Thank you for coming to see [place] at [facility]. I hope we have been able to provide you high quality care today.  You may receive a patient satisfaction survey over the next few weeks. We would appreciate your feedback and comments so that we may continue to improve ourselves and the health of our patients.  -MEMORY ISSUES - continue treatments for pain and mood - healthy brain activities reviewed  MIGRAINE WITHOUT AURA - use tylenol as needed  - may consider topiramate, aimovig, botox or rizatriptan in future  SCALP CONDITION - follow up with PCP or dermatology   ~~~~~~~~~~~~~~~~~~~~~~~~~~~~~~~~~~~~~~~~~~~~~~~~~~~~~~~~~~~~~~~~~  DR. PENUMALLI'S GUIDE TO HAPPY AND HEALTHY LIVING These are some of my general health and wellness recommendations. Some of them may apply to you better than others. Please use common sense as you try these suggestions and feel free to ask me any questions.   ACTIVITY/FITNESS Mental, social, emotional and physical stimulation are very important for brain and body health. Try learning a new activity (arts, music, language, sports, games).  Keep moving your body to the best of your abilities. You can do this at home, inside or outside, the park, community center, gym or anywhere you like. Consider a physical therapist or personal trainer to get started. Consider the app Sworkit. Fitness trackers such as smart-watches, smart-phones or Fitbits can help as well.   NUTRITION Eat more plants: colorful vegetables, nuts, seeds and berries.  Eat less sugar, salt, preservatives and processed foods.  Avoid toxins such as cigarettes and alcohol.  Drink water when you are thirsty. Warm water with a slice of lemon is an excellent morning drink to start the day.  Consider these websites for more information The Nutrition Source (https://www.henry-hernandez.biz/) Precision Nutrition  (WindowBlog.ch)   RELAXATION Consider practicing mindfulness meditation or other relaxation techniques such as deep breathing, prayer, yoga, tai chi, massage. See website mindful.org or the apps Headspace or Calm to help get started.   SLEEP Try to get at least 7-8+ hours sleep per day. Regular exercise and reduced caffeine will help you sleep better. Practice good sleep hygeine techniques. See website sleep.org for more information.   PLANNING Prepare estate planning, living will, healthcare POA documents. Sometimes this is best planned with the help of an attorney. Theconversationproject.org and agingwithdignity.org are excellent resources.

## 2017-05-09 NOTE — Progress Notes (Signed)
[facility]  PATIENT: Cindy Pratt DOB: 07-Jan-1966  REFERRING CLINICIAN: ER  HISTORY FROM: patient and chart review REASON FOR VISIT: new consult / existing patient   HISTORICAL  CHIEF COMPLAINT:  Chief Complaint  Patient presents with  . NX / ER Referral  . Headache/ memory    4-5 months ago, a ridge on head and then headaches.  Forgetfulness    HISTORY OF PRESENT ILLNESS:   UPDATE (05/09/17, VRP): Since last visit, here for evaluation of memory lapses, diff with concentration. Headaches are continuing right sided; throbbing; nausea with HA. Has constant photosensitivity. Uses tylenol as needed for headaches. Cannot use NSAIDs because of allergic reactions.  UPDATE 03/05/15: Since last visit, doing well/ stable. Memory loss stable. Pain and mood stable. On treatment.   PRIOR HPI (01/17/15): 51 year old right-handed female here for evaluation of memory lapses. Patient has chronic pain, on long-term narcotics, depression, anxiety, bipolar, on mood stabilizing medications. Patient reports at least 6 months of word finding difficulties and short-term memory problems. Her daughter thinks probably been going on for one year. Patient has been on pain and mood medications for at least 2 years. She has long mood disorder problems from age 38 years old related to family problems. Patient having more stress in her life in the last 1-2 years.  Patient fell in 1996, with resultant back injuries. She had surgeries in 1997 at 2003 for her back. She's been on long-term pain management since then. Patient also has long standing sleep problems with interrupted sleep.   REVIEW OF SYSTEMS: Full 14 system review of systems performed and negative with exception of: pain memory loss tremors.   ALLERGIES: Allergies  Allergen Reactions  . Aspirin Shortness Of Breath  . Nsaids Shortness Of Breath  . Sulfa Antibiotics Hives  . Sulfonamide Derivatives Hives  . Tolmetin Shortness Of  Breath  . Cymbalta [Duloxetine Hcl] Other (See Comments)    confusion  . Duloxetine Other (See Comments)    confusion  . Gabapentin Other (See Comments)    Causes confusion  . Other Other (See Comments)    Aswanghda? Patient is not sure of spelling it is an herb- caused confusion  . Pregabalin Other (See Comments)    confusion    HOME MEDICATIONS: Outpatient Medications Prior to Visit  Medication Sig Dispense Refill  . B Complex-Folic Acid (SUPER B COMPLEX MAXI PO) Take 1 tablet by mouth daily with supper.     Marland Kitchen buPROPion (WELLBUTRIN SR) 150 MG 12 hr tablet Take 150 mg by mouth 2 (two) times daily.    . cetirizine (ZYRTEC) 10 MG tablet Take 10 mg by mouth daily as needed for allergies.     Marland Kitchen FLUoxetine HCl 60 MG TABS Take 60 mg by mouth daily.  5  . lubiprostone (AMITIZA) 8 MCG capsule Take 1 capsule (8 mcg total) by mouth daily with breakfast. 30 capsule 0  . lurasidone (LATUDA) 40 MG TABS tablet Take 40 mg by mouth at bedtime.     Marland Kitchen morphine (MS CONTIN) 30 MG 12 hr tablet Take 1 tablet (30 mg total) by mouth every 8 (eight) hours. 90 tablet 0  . Multiple Vitamin (MULTIVITAMIN WITH MINERALS) TABS tablet Take 1 tablet by mouth daily with supper.     Marland Kitchen tiZANidine (ZANAFLEX) 4 MG tablet Take 1 tablet (4 mg total) by mouth 2 (two) times daily. 60 tablet 5  . traMADol (ULTRAM) 50 MG tablet Take 1 tablet (50 mg total) by mouth 2 (  two) times daily as needed. 60 tablet 2   No facility-administered medications prior to visit.     PAST MEDICAL HISTORY: Past Medical History:  Diagnosis Date  . Anxiety   . Asthma   . Belchings   . Chronic pain syndrome   . Degenerative joint disease   . Depression   . Disturbance of skin sensation   . Fibromyalgia   . GERD (gastroesophageal reflux disease)   . Hyperlipidemia   . Lumbar post-laminectomy syndrome   . Lumbosacral neuritis   . Osteoarthritis   . Other chronic postoperative pain   . Sciatica     PAST SURGICAL HISTORY: Past Surgical  History:  Procedure Laterality Date  . ABDOMINAL HYSTERECTOMY    . BACK SURGERY  1997, 2003  . CYSTECTOMY    . DIAGNOSTIC LAPAROSCOPY    . WRIST ARTHROPLASTY Left   . WRIST ARTHROSCOPY WITH DEBRIDEMENT Right 11/13/2015   Procedure: RIGHT WRIST ARTHROSCOPY WITH DEBRIDEMENT;  Surgeon: Betha LoaKevin Kuzma, MD;  Location: [facility];  Service: Orthopedics;  Laterality: Right;    FAMILY HISTORY: Family History  Problem Relation Age of Onset  . Hypertension Mother   . Hypertension Father   . Heart attack Father   . Breast cancer Maternal Grandmother   . Liver cancer Maternal Grandmother   . Cancer Maternal Grandmother        breast cancer  . Ovarian cancer Cousin   . Cancer Cousin        1st c. maternal side/ cervical cancer  . Cancer Cousin        1st c maternal/ colon cancer  . Diabetes Maternal Grandfather        both sides    SOCIAL HISTORY:  Social History   Social History  . Marital status: Married    Spouse name: Velda ShellHubert  . Number of children: 4  . Years of education: M   Occupational History  . disabled Disable   Social History Main Topics  . Smoking status: Former Smoker    Packs/day: 1.00    Years: 20.00    Types: Cigarettes    Quit date: 07/31/2007  . Smokeless tobacco: Never Used  . Alcohol use No  . Drug use: No  . Sexual activity: Yes    Birth control/ protection: Surgical   Other Topics Concern  . Not on file   Social History Narrative   Lives at home with husband Velda ShellHubert   Drinks 4 sodas a day     PHYSICAL EXAM  GENERAL EXAM/CONSTITUTIONAL: Vitals:  Vitals:   05/09/17 1242  BP: 100/70  Pulse: 79  Weight: 157 lb 12.8 oz (71.6 kg)  Height: 5' 3.5" (1.613 m)     Body mass index is 27.51 kg/m.  No exam data present  Patient is in no distress; well developed, nourished and groomed; neck is supple  SLIGHTLY RAISED, SLIGHTLY HARD AREA IN MIDLINE SCALP ANTERIORLY; NO RASH OR TENDERNESS  CARDIOVASCULAR:  Examination of  carotid arteries is normal; no carotid bruits  Regular rate and rhythm, no murmurs  Examination of peripheral vascular system by observation and palpation is normal  EYES:  Ophthalmoscopic exam of optic discs and posterior segments is normal; no papilledema or hemorrhages  MUSCULOSKELETAL:  Gait, strength, tone, movements noted in Neurologic exam below  NEUROLOGIC: MENTAL STATUS:  MMSE - Mini Mental State Exam 05/09/2017 03/05/2015 01/17/2015  Orientation to time 5 5 5   Orientation to Place 5 5 5   Registration 3 3 3  Attention/ Calculation Recall Language- name 2 objects Language- repeat 1 0 1  Language- follow 3 step command Language- read & follow direction Write a sentence Copy design Total score awake, alert, oriented to person, place and time  recent and remote memory intact  normal attention and concentration  language fluent, comprehension intact, naming intact,   fund of knowledge appropriate  CRANIAL NERVE:   2nd - no papilledema on fundoscopic exam  2nd, 3rd, 4th, 6th - pupils equal and reactive to light, visual fields full to confrontation, extraocular muscles intact, no nystagmus  5th - facial sensation symmetric  7th - facial strength symmetric  8th - hearing intact  9th - palate elevates symmetrically, uvula midline  11th - shoulder shrug symmetric  12th - tongue protrusion midline  MOTOR:   normal bulk and tone, full strength in the BUE, BLE  SENSORY:   normal and symmetric to light touch, temperature, vibration  COORDINATION:   finger-nose-finger, fine finger movements normal  REFLEXES:   deep tendon reflexes present and symmetric  GAIT/STATION:   narrow based gait    DIAGNOSTIC DATA (LABS, IMAGING, TESTING) - I reviewed patient records, labs, notes, testing and imaging myself where available.  Lab Results  Component Value Date   WBC 6.4 03/18/2017   HGB 12.6  03/18/2017   HCT 38.9 03/18/2017   MCV 89.4 03/18/2017   PLT 241 03/18/2017      Component Value Date/Time   NA 140 03/18/2017 2132   K 3.9 03/18/2017 2132   CL 103 03/18/2017 2132   CO2 29 03/18/2017 2132   GLUCOSE 102 (H) 03/18/2017 2132   BUN 9 03/18/2017 2132   CREATININE 0.94 03/18/2017 2132   CALCIUM 9.2 03/18/2017 2132   PROT 7.3 07/19/2011 1833   ALBUMIN 4.0 07/19/2011 1833   AST 28 07/19/2011 1833   ALT 22 07/19/2011 1833   ALKPHOS 79 07/19/2011 1833   BILITOT 0.2 (L) 07/19/2011 1833   GFRNONAA >60 03/18/2017 2132   GFRAA >60 03/18/2017 2132   Lab Results  Component Value Date   CHOL 218 (H) 01/29/2009   HDL 67.10 01/29/2009   LDLDIRECT 128.7 01/29/2009   TRIG 102.0 01/29/2009   CHOLHDL 3 01/29/2009   No results found for: HGBA1C Lab Results  Component Value Date   VITAMINB12 1,076 (H) 01/17/2015   Lab Results  Component Value Date   TSH 1.800 01/17/2015     01/31/15 MRI brain [I reviewed images myself and agree with interpretation. -VRP]  - Abnormal MRI brain showing scattered tiny periventricular and subcortical supratentorial tiny white matter hyperintensities which are nonspecific with differential discussed above.  03/18/17 CT head [I reviewed images myself and agree with interpretation. -VRP]  - Stable appearance of the brain without acute intracranial abnormality.     ASSESSMENT AND PLAN  51 y.o. year old female here with:   Ddx: mild memory / concentration impairment (related to mood and pain issues) + migraine without aura + benign skin finding (? tinea vs keratosis vs lipoma)  Meds tried and failed: NSAIDS (allergic reaction), gabapentin / lyrica (confused / groggy)  1. Memory loss   2. Migraine without aura and without status migrainosus, not intractable   3. Skin lesion of scalp       PLAN:  I spent 40 minutes  of face to face time with patient. Greater than 50% of time was spent in counseling and coordination of care with  patient. In summary we discussed:   MEMORY ISSUES - continue treatments for pain and mood - healthy brain activities reviewed  MIGRAINE WITHOUT AURA - use tylenol as needed  - may consider topiramate in future, but will hold off for now due to polypharmacy and memory issues - may consider CGRP antagonist meds in future (aimovig) - may consider botox for migraine - may consider rizatriptan for migraine rescue  SCALP CONDITION (likely benign skin condition) - follow up with PCP or dermatology  Return if symptoms worsen or fail to improve, for return to PCP.    Suanne Marker, MD 05/09/2017, 1:19 PM Certified in Neurology, Neurophysiology and Neuroimaging  [facility] [address] [place], [place] [address] [phone] a

## 2017-05-23 ENCOUNTER — Encounter: Payer: Self-pay | Admitting: Registered Nurse

## 2017-05-23 ENCOUNTER — Encounter: Payer: Medicare Other | Attending: Registered Nurse | Admitting: Registered Nurse

## 2017-05-23 VITALS — BP 108/73 | HR 64

## 2017-05-23 DIAGNOSIS — G894 Chronic pain syndrome: Secondary | ICD-10-CM

## 2017-05-23 DIAGNOSIS — M5412 Radiculopathy, cervical region: Secondary | ICD-10-CM

## 2017-05-23 DIAGNOSIS — M5416 Radiculopathy, lumbar region: Secondary | ICD-10-CM | POA: Diagnosis not present

## 2017-05-23 DIAGNOSIS — Z5181 Encounter for therapeutic drug level monitoring: Secondary | ICD-10-CM

## 2017-05-23 DIAGNOSIS — E785 Hyperlipidemia, unspecified: Secondary | ICD-10-CM | POA: Insufficient documentation

## 2017-05-23 DIAGNOSIS — M1990 Unspecified osteoarthritis, unspecified site: Secondary | ICD-10-CM | POA: Insufficient documentation

## 2017-05-23 DIAGNOSIS — J45909 Unspecified asthma, uncomplicated: Secondary | ICD-10-CM | POA: Insufficient documentation

## 2017-05-23 DIAGNOSIS — Z87891 Personal history of nicotine dependence: Secondary | ICD-10-CM | POA: Diagnosis not present

## 2017-05-23 DIAGNOSIS — M542 Cervicalgia: Secondary | ICD-10-CM | POA: Diagnosis not present

## 2017-05-23 DIAGNOSIS — Z79899 Other long term (current) drug therapy: Secondary | ICD-10-CM | POA: Diagnosis not present

## 2017-05-23 DIAGNOSIS — Z981 Arthrodesis status: Secondary | ICD-10-CM | POA: Diagnosis not present

## 2017-05-23 DIAGNOSIS — G8929 Other chronic pain: Secondary | ICD-10-CM | POA: Diagnosis present

## 2017-05-23 DIAGNOSIS — M797 Fibromyalgia: Secondary | ICD-10-CM | POA: Insufficient documentation

## 2017-05-23 DIAGNOSIS — M961 Postlaminectomy syndrome, not elsewhere classified: Secondary | ICD-10-CM | POA: Diagnosis not present

## 2017-05-23 DIAGNOSIS — G629 Polyneuropathy, unspecified: Secondary | ICD-10-CM | POA: Insufficient documentation

## 2017-05-23 DIAGNOSIS — F329 Major depressive disorder, single episode, unspecified: Secondary | ICD-10-CM | POA: Diagnosis not present

## 2017-05-23 MED ORDER — TOPIRAMATE 25 MG PO TABS: 25.0000 mg | ORAL_TABLET | Freq: Every day | ORAL | 2 refills | Status: DC

## 2017-05-23 MED ORDER — TRAMADOL HCL 50 MG PO TABS: 50.0000 mg | ORAL_TABLET | Freq: Two times a day (BID) | ORAL | 2 refills | Status: DC | PRN

## 2017-05-23 MED ORDER — MORPHINE SULFATE ER 30 MG PO TBCR: 30.0000 mg | EXTENDED_RELEASE_TABLET | Freq: Three times a day (TID) | ORAL | 0 refills | Status: DC

## 2017-05-23 NOTE — Patient Instructions (Signed)
[employer] samples with Education packet

## 2017-05-23 NOTE — Progress Notes (Signed)
Subjective:    Patient ID: Cindy Pratt, female    DOB: 11/13/1965, 51 y.o.   MRN: [id number]  HPI: [name] is a 51year old female who returns for follow up appointmentfor chronic pain and medication refill. She states her pain is located in her neck radiating into her bilateral shoulders and bilateral upper extremities with tingling and numbness and lower back radiating into her left lower extremity.Also states she has generalized pain all over.She rates her pain 9.Her current exercise regime is walking in the home.  Also states she constipated she has been prescribed Amitiza it is ineffective in past she has used colace, stool softners and vegetable laxative with no relief noted. She was given Movantik samples and education provided, she verbalizes understanding. Instructed to call office on Friday 05/27/2017 to evaluate medication regimen. She verbalizes understanding.   Last UDS was performed on : 02/22/2017, it was consistent.    Pain Inventory Average Pain 9 Pain Right Now 9 My pain is constant, sharp, burning, dull, stabbing, tingling and aching  In the last 24 hours, has pain interfered with the following? General activity 8 Relation with others 8 Enjoyment of life 8 What TIME of day is your pain at its worst? all Sleep (in general) Fair  Pain is worse with: all Pain improves with: medication Relief from Meds: not answer  Mobility Do you have any goals in this area?  no  Function Do you have any goals in this area?  no  Neuro/Psych weakness numbness tremor tingling dizziness confusion depression anxiety  Prior Studies Any changes since last visit?  no  Physicians involved in your care Any changes since last visit?  no   Family History  Problem Relation Age of Onset  . Hypertension Mother   . Hypertension Father   . Heart attack Father   . Breast cancer Maternal Grandmother   . Liver cancer Maternal Grandmother   . Cancer Maternal  Grandmother        breast cancer  . Ovarian cancer Cousin   . Cancer Cousin        1st c. maternal side/ cervical cancer  . Cancer Cousin        1st c maternal/ colon cancer  . Diabetes Maternal Grandfather        both sides   Social History   Social History  . Marital status: Married    Spouse name: Cindy Pratt  . Number of children: 4  . Years of education: M   Occupational History  . disabled Disable   Social History Main Topics  . Smoking status: Former Smoker    Packs/day: 1.00    Years: 20.00    Types: Cigarettes    Quit date: 07/31/2007  . Smokeless tobacco: Never Used  . Alcohol use No  . Drug use: No  . Sexual activity: Yes    Birth control/ protection: Surgical   Other Topics Concern  . None   Social History Narrative   Lives at home with husband Cindy Pratt   Drinks 4 sodas a day   Past Surgical History:  Procedure Laterality Date  . ABDOMINAL HYSTERECTOMY    . BACK SURGERY  1997, 2003  . CYSTECTOMY    . DIAGNOSTIC LAPAROSCOPY    . WRIST ARTHROPLASTY Left   . WRIST ARTHROSCOPY WITH DEBRIDEMENT Right 11/13/2015   Procedure: RIGHT WRIST ARTHROSCOPY WITH DEBRIDEMENT;  Surgeon: Betha Loa, MD;  Location: [facility];  Service: Orthopedics;  Laterality: Right;  Past Medical History:  Diagnosis Date  . Anxiety   . Asthma   . Belchings   . Chronic pain syndrome   . Degenerative joint disease   . Depression   . Disturbance of skin sensation   . Fibromyalgia   . GERD (gastroesophageal reflux disease)   . Hyperlipidemia   . Lumbar post-laminectomy syndrome   . Lumbosacral neuritis   . Osteoarthritis   . Other chronic postoperative pain   . Sciatica    BP 108/73   Pulse 64   SpO2 96%   Opioid Risk Score:  2 Fall Risk Score:  `1  Depression screen PHQ 2/9  Depression screen [facility] 2/9 05/23/2017 02/16/2016 10/20/2015 05/22/2015 12/02/2014  Decreased Interest 3 0 Down, Depressed, Hopeless 3 0 PHQ - 2 Score 6 0 Altered  sleeping - - - - 1  Tired, decreased energy - - - - 1  Change in appetite - - - - 0  Feeling bad or failure about yourself  - - - - 0  Trouble concentrating - - - - 1  Moving slowly or fidgety/restless - - - - 0  Suicidal thoughts - - - - 0  PHQ-9 Score - - - - 5     Review of Systems  HENT: Negative.   Eyes: Negative.   Respiratory: Negative.   Cardiovascular: Negative.   Gastrointestinal: Negative.   Endocrine: Negative.   Genitourinary: Negative.   Musculoskeletal:       Spasms  Skin: Negative.   Allergic/Immunologic: Negative.   Neurological: Positive for dizziness, tremors and weakness.       Tingling  Hematological: Negative.   Psychiatric/Behavioral: Positive for confusion and dysphoric mood. The patient is nervous/anxious.   All other systems reviewed and are negative.      Objective:   Physical Exam  Constitutional: She is oriented to person, place, and time. She appears well-developed and well-nourished.  HENT:  Head: Normocephalic and atraumatic.  Neck: Normal range of motion. Neck supple.  Cervical Paraspinal Tenderness: C-5-C-6  Cardiovascular: Normal rate and regular rhythm.   Pulmonary/Chest: Effort normal and breath sounds normal.  Musculoskeletal:  Normal Muscle Bulk and Muscle Testing Reveals: Upper Extremities: Full  ROM and Muscle Strength 5/5 Bilateral AC Joint Tenderness Thoracic Paraspinal tenderness: T-7-T-9 Lumbar Paraspinal Tenderness: L-3-L-5 Lower Extremities: Full ROM and Muscle Strength 5/5 Arises from Table with ease Narrow Based Gait  Neurological: She is alert and oriented to person, place, and time.  Skin: Skin is warm and dry.  Psychiatric: She has a normal mood and affect.  Nursing note and vitals reviewed.         Assessment & Plan:  1.Lumbar postlaminectomy syndrome status post L5-S1 fusion radiating to LLE: 05/23/2017 Refilled: MS contin 30 mg # 90 pills--use one pill every 8 hours for pain and ContinueTramadol 50 mg  BID. #60. We will continue the opioid monitoring program, this consists of regular clinic visits, examinations, urine drug screen, pill counts as well as use of [place] Controlled Substance reporting System. 2. Depression: Continue Current Medication Regimen of Prozac, Latuda and Wellbutrin. PCP following. 05/23/2017 3. Muscle Spasm: Continue Tizanidine. 05/23/2017 4. Sacroiliac Joint Dysfunction: Continue to monitor.05/23/2017  20 minutes of face to face patient care time was spent during this visit. All questions were encouraged and answered.  F/U in 1 month

## 2017-06-01 ENCOUNTER — Telehealth: Payer: Self-pay

## 2017-06-01 NOTE — Telephone Encounter (Addendum)
Patient of Dr. Doroteo Bradford, sees Jacalyn Lefevre alot and was given samples of Movantik and says that it is working really well and wants a prescription called in to [facility].

## 2017-06-03 MED ORDER — NALOXEGOL OXALATE 25 MG PO TABS: 25.0000 mg | ORAL_TABLET | Freq: Every day | ORAL | 2 refills | Status: DC

## 2017-06-03 NOTE — Telephone Encounter (Signed)
Left a Message on Cindy Pratt voicemail, Movantik was ordered, and apologize for the delay.

## 2017-06-21 ENCOUNTER — Ambulatory Visit (HOSPITAL_BASED_OUTPATIENT_CLINIC_OR_DEPARTMENT_OTHER): Payer: Medicare Other | Admitting: Physical Medicine & Rehabilitation

## 2017-06-21 ENCOUNTER — Encounter: Payer: Medicare Other | Attending: Registered Nurse

## 2017-06-21 ENCOUNTER — Encounter: Payer: Self-pay | Admitting: Physical Medicine & Rehabilitation

## 2017-06-21 VITALS — BP 104/69 | HR 68

## 2017-06-21 DIAGNOSIS — M1990 Unspecified osteoarthritis, unspecified site: Secondary | ICD-10-CM | POA: Insufficient documentation

## 2017-06-21 DIAGNOSIS — F329 Major depressive disorder, single episode, unspecified: Secondary | ICD-10-CM | POA: Diagnosis not present

## 2017-06-21 DIAGNOSIS — Z87891 Personal history of nicotine dependence: Secondary | ICD-10-CM | POA: Insufficient documentation

## 2017-06-21 DIAGNOSIS — M797 Fibromyalgia: Secondary | ICD-10-CM | POA: Diagnosis not present

## 2017-06-21 DIAGNOSIS — Z981 Arthrodesis status: Secondary | ICD-10-CM | POA: Diagnosis not present

## 2017-06-21 DIAGNOSIS — M533 Sacrococcygeal disorders, not elsewhere classified: Secondary | ICD-10-CM | POA: Diagnosis not present

## 2017-06-21 DIAGNOSIS — G629 Polyneuropathy, unspecified: Secondary | ICD-10-CM | POA: Diagnosis not present

## 2017-06-21 DIAGNOSIS — G8929 Other chronic pain: Secondary | ICD-10-CM | POA: Diagnosis present

## 2017-06-21 DIAGNOSIS — J45909 Unspecified asthma, uncomplicated: Secondary | ICD-10-CM | POA: Insufficient documentation

## 2017-06-21 DIAGNOSIS — M961 Postlaminectomy syndrome, not elsewhere classified: Secondary | ICD-10-CM

## 2017-06-21 DIAGNOSIS — E785 Hyperlipidemia, unspecified: Secondary | ICD-10-CM | POA: Diagnosis not present

## 2017-06-21 MED ORDER — MORPHINE SULFATE ER 30 MG PO TBCR: 30.0000 mg | EXTENDED_RELEASE_TABLET | Freq: Three times a day (TID) | ORAL | 0 refills | Status: DC

## 2017-06-21 NOTE — Patient Instructions (Signed)
Sacroiliac radiofrequency neurotomy  Would first need to do test blocks of the S1-S2-S3 nerves  If there was a good temporary relief of at least 50% than we would move on to do the radiofrequency neurotomy of the same nerves

## 2017-06-21 NOTE — Progress Notes (Signed)
Subjective:    Patient ID: Cindy Pratt, female    DOB: 11-11-1965, 51 y.o.   MRN: [id number]  HPI Last MD visit 12/28/2016 We discussed the results of her sacroiliac injection we discussed sacroiliac disorder as it pertains to lumbar fusion.  She did have good relief with bilateral sacroiliac injection but duration was 2 days.  We discussed other treatment options including radiofrequency neurotomy  Interval history: Seen by neurology for headaches.  Diagnosed with migraine without aura without status migrainosus Pain Inventory Average Pain 9 Pain Right Now 7 My pain is constant, sharp, burning, dull, stabbing, tingling and aching  In the last 24 hours, has pain interfered with the following? General activity 0 Relation with others 0 Enjoyment of life 0 What TIME of day is your pain at its worst? . Sleep (in general) Fair  Pain is worse with: . Pain improves with: . Relief from Meds: .  Mobility walk without assistance  Function Do you have any goals in this area?  no  Neuro/Psych weakness numbness tremor tingling dizziness confusion depression  Prior Studies Any changes since last visit?  no  Physicians involved in your care Any changes since last visit?  no   Family History  Problem Relation Age of Onset  . Hypertension Mother   . Hypertension Father   . Heart attack Father   . Breast cancer Maternal Grandmother   . Liver cancer Maternal Grandmother   . Cancer Maternal Grandmother        breast cancer  . Ovarian cancer Cousin   . Cancer Cousin        1st c. maternal side/ cervical cancer  . Cancer Cousin        1st c maternal/ colon cancer  . Diabetes Maternal Grandfather        both sides   Social History   Social History  . Marital status: Married    Spouse name: [name]  . Number of children: 4  . Years of education: M   Occupational History  . disabled Disable   Social History Main Topics  . Smoking status: Former Smoker   Packs/day: 1.00    Years: 20.00    Types: Cigarettes    Quit date: 07/31/2007  . Smokeless tobacco: Never Used  . Alcohol use No  . Drug use: No  . Sexual activity: Yes    Birth control/ protection: Surgical   Other Topics Concern  . Not on file   Social History Narrative   Lives at home with husband [name]   Drinks 4 sodas a day   Past Surgical History:  Procedure Laterality Date  . ABDOMINAL HYSTERECTOMY    . BACK SURGERY  1997, 2003  . CYSTECTOMY    . DIAGNOSTIC LAPAROSCOPY    . WRIST ARTHROPLASTY Left   . WRIST ARTHROSCOPY WITH DEBRIDEMENT Right 11/13/2015   Procedure: RIGHT WRIST ARTHROSCOPY WITH DEBRIDEMENT;  Surgeon: Betha Loa, MD;  Location: [facility];  Service: Orthopedics;  Laterality: Right;   Past Medical History:  Diagnosis Date  . Anxiety   . Asthma   . Belchings   . Chronic pain syndrome   . Degenerative joint disease   . Depression   . Disturbance of skin sensation   . Fibromyalgia   . GERD (gastroesophageal reflux disease)   . Hyperlipidemia   . Lumbar post-laminectomy syndrome   . Lumbosacral neuritis   . Osteoarthritis   . Other chronic postoperative pain   . Sciatica  There were no vitals taken for this visit.  Opioid Risk Score:   Fall Risk Score:  `1  Depression screen PHQ 2/9  [facility] 2/[facility] 05/23/2017 02/16/2016 10/20/2015 05/22/2015 12/02/2014  Decreased Interest 3 0 1 1 1   Down, Depressed, Hopeless 3 0 1 1 1   PHQ - 2 Score 6 0 2 2 2   Altered sleeping - - - - 1  Tired, decreased energy - - - - 1  Change in appetite - - - - 0  Feeling bad or failure about yourself  - - - - 0  Trouble concentrating - - - - 1  Moving slowly or fidgety/restless - - - - 0  Suicidal thoughts - - - - 0  PHQ-9 Score - - - - 5     Review of Systems  Constitutional: Negative.   HENT: Negative.   Eyes: Negative.   Respiratory: Negative.   Cardiovascular: Negative.   Gastrointestinal: Negative.   Endocrine: Negative.     Genitourinary: Negative.   Musculoskeletal: Negative.   Skin: Negative.   Neurological: Negative.   Hematological: Negative.   Psychiatric/Behavioral: Negative.   All other systems reviewed and are negative.      Objective:   Physical Exam  Constitutional: She is oriented to person, place, and time. She appears well-developed and well-nourished.  HENT:  Head: Normocephalic and atraumatic.  Eyes: Pupils are equal, round, and reactive to light. Conjunctivae and EOM are normal.  Neck: Normal range of motion.  Musculoskeletal:  She without tenderness to palpation in the lumbar or gluteal area. Her lumbar range of motion is reduced to 25% flexion extension lateral bending and rotation    Neurological: She is alert and oriented to person, place, and time. Gait normal.   gait is without evidence of toe drag or knee instability good standing balance.  Psychiatric: She has a normal mood and affect. Her behavior is normal. Judgment and thought content normal.          Assessment & Plan:  1.  Lumbar post laminectomy syndrome L5-S1 fusion She is on chronic narcotic analgesics, MS Contin 30 mg 3 times daily and tramadol 50 mg twice daily as needed Reviewed data from PMP aware, went over NARX score, which puts her in the top 5% of daily opiate dose in [place].  Reviewed overdose risk, 10 times elevated risk of unintentional overdose. Patient is not asking for increased dose today  2.  Sacroiliac joint disorder.  We discussed interventional procedures that potentially may have longer duration of response such as radiofrequency neurotomy.  Would need S1-S2-S3 lateral branch blocks first, could not access L5 dorsal ramus secondary to fusion level Went over spine model  #3.  History of headache patient elects not to follow-up with neurology She is on Topamax 25 mg/day for neuropathic pain, higher doses may help prevent headaches

## 2017-06-22 DIAGNOSIS — H903 Sensorineural hearing loss, bilateral: Secondary | ICD-10-CM | POA: Insufficient documentation

## 2017-06-22 DIAGNOSIS — H6983 Other specified disorders of Eustachian tube, bilateral: Secondary | ICD-10-CM | POA: Insufficient documentation

## 2017-07-18 ENCOUNTER — Encounter: Payer: Medicare Other | Attending: Registered Nurse | Admitting: Registered Nurse

## 2017-07-18 ENCOUNTER — Ambulatory Visit: Payer: Medicare Other | Admitting: Registered Nurse

## 2017-07-18 ENCOUNTER — Encounter: Payer: Self-pay | Admitting: Registered Nurse

## 2017-07-18 ENCOUNTER — Other Ambulatory Visit: Payer: Self-pay

## 2017-07-18 VITALS — BP 101/69 | HR 69

## 2017-07-18 DIAGNOSIS — Z981 Arthrodesis status: Secondary | ICD-10-CM | POA: Insufficient documentation

## 2017-07-18 DIAGNOSIS — E785 Hyperlipidemia, unspecified: Secondary | ICD-10-CM | POA: Diagnosis not present

## 2017-07-18 DIAGNOSIS — T402X5A Adverse effect of other opioids, initial encounter: Secondary | ICD-10-CM

## 2017-07-18 DIAGNOSIS — M5412 Radiculopathy, cervical region: Secondary | ICD-10-CM | POA: Diagnosis not present

## 2017-07-18 DIAGNOSIS — G894 Chronic pain syndrome: Secondary | ICD-10-CM

## 2017-07-18 DIAGNOSIS — J45909 Unspecified asthma, uncomplicated: Secondary | ICD-10-CM | POA: Diagnosis not present

## 2017-07-18 DIAGNOSIS — Z5181 Encounter for therapeutic drug level monitoring: Secondary | ICD-10-CM

## 2017-07-18 DIAGNOSIS — M5416 Radiculopathy, lumbar region: Secondary | ICD-10-CM

## 2017-07-18 DIAGNOSIS — Z87891 Personal history of nicotine dependence: Secondary | ICD-10-CM | POA: Diagnosis not present

## 2017-07-18 DIAGNOSIS — F329 Major depressive disorder, single episode, unspecified: Secondary | ICD-10-CM | POA: Insufficient documentation

## 2017-07-18 DIAGNOSIS — M961 Postlaminectomy syndrome, not elsewhere classified: Secondary | ICD-10-CM | POA: Insufficient documentation

## 2017-07-18 DIAGNOSIS — Z79899 Other long term (current) drug therapy: Secondary | ICD-10-CM | POA: Diagnosis not present

## 2017-07-18 DIAGNOSIS — K5903 Drug induced constipation: Secondary | ICD-10-CM | POA: Diagnosis not present

## 2017-07-18 DIAGNOSIS — M533 Sacrococcygeal disorders, not elsewhere classified: Secondary | ICD-10-CM | POA: Diagnosis not present

## 2017-07-18 DIAGNOSIS — M797 Fibromyalgia: Secondary | ICD-10-CM | POA: Diagnosis not present

## 2017-07-18 DIAGNOSIS — M1990 Unspecified osteoarthritis, unspecified site: Secondary | ICD-10-CM | POA: Diagnosis not present

## 2017-07-18 DIAGNOSIS — G8929 Other chronic pain: Secondary | ICD-10-CM | POA: Diagnosis present

## 2017-07-18 DIAGNOSIS — G629 Polyneuropathy, unspecified: Secondary | ICD-10-CM | POA: Diagnosis not present

## 2017-07-18 DIAGNOSIS — M542 Cervicalgia: Secondary | ICD-10-CM

## 2017-07-18 MED ORDER — TRAMADOL HCL 50 MG PO TABS: 50.0000 mg | ORAL_TABLET | Freq: Two times a day (BID) | ORAL | 2 refills | Status: DC | PRN

## 2017-07-18 MED ORDER — MORPHINE SULFATE ER 30 MG PO TBCR: 30.0000 mg | EXTENDED_RELEASE_TABLET | Freq: Three times a day (TID) | ORAL | 0 refills | Status: DC

## 2017-07-18 NOTE — Progress Notes (Signed)
Subjective:    Patient ID: Cindy Pratt, female    DOB: [birth year], 51 y.o.   MRN: [id number]  HPI: Cindy Pratt is a 51year old female who returns for follow up appointmentfor chronic pain and medication refill. She states her pain is located in her neck radiating into her bilateral shoulders,  lower back pain  radiating into her left lower extremity also reports left foot pain.Also states she has generalized pain all over.She rates her pain 6.Her current exercise regime is walking in the home.  Ms. [name] Morphine equivalent is  90.00 MME.  Last month I ordered Movantik due to Opioid Induced Constipation, this was denied.  [facility] wanted this provider to prescribe Lactulose this is not the treatment modality for Opioid Induced Constipation. We will submit an appeal today. In the past  she has been prescribed Amitiza which was ineffective as well as colace, stool softners and vegetable laxative with no relief noted.   Last UDS was performed on : 02/22/2017, it was consistent.    Pain Inventory Average Pain 9 Pain Right Now 6 My pain is intermittent, constant, sharp, burning, dull, stabbing, tingling and aching  In the last 24 hours, has pain interfered with the following? General activity 8 Relation with others 8 Enjoyment of life 8 What TIME of day is your pain at its worst? all Sleep (in general) Fair  Pain is worse with: all Pain improves with: medication and . Relief from Meds: 9  Mobility walk without assistance ability to climb steps?  yes do you drive?  yes Do you have any goals in this area?  no  Function Do you have any goals in this area?  no  Neuro/Psych weakness numbness tremor tingling trouble walking dizziness confusion depression anxiety  Prior Studies Any changes since last visit?  no  Physicians involved in your care Any changes since last visit?  no   Family History  Problem Relation Age of Onset  . Hypertension Mother     . Hypertension Father   . Heart attack Father   . Breast cancer Maternal Grandmother   . Liver cancer Maternal Grandmother   . Cancer Maternal Grandmother        breast cancer  . Ovarian cancer Cousin   . Cancer Cousin        1st c. maternal side/ cervical cancer  . Cancer Cousin        1st c maternal/ colon cancer  . Diabetes Maternal Grandfather        both sides   Social History   Socioeconomic History  . Marital status: Married    Spouse name: Cindy ShellHubert  . Number of children: 4  . Years of education: M  . Highest education level: Not on file  Social Needs  . Financial resource strain: Not on file  . Food insecurity - worry: Not on file  . Food insecurity - inability: Not on file  . Transportation needs - medical: Not on file  . Transportation needs - non-medical: Not on file  Occupational History  . Occupation: disabled    Employer: DISABLE  Tobacco Use  . Smoking status: Former Smoker    Packs/day: 1.00    Years: 20.00    Pack years: 20.00    Types: Cigarettes    Last attempt to quit: 07/31/2007    Years since quitting: 9.9  . Smokeless tobacco: Never Used  Substance and Sexual Activity  . Alcohol use: No  . Drug  use: No  . Sexual activity: Yes    Birth control/protection: Surgical  Other Topics Concern  . Not on file  Social History Narrative   Lives at home with husband Cindy Pratt   Drinks 4 sodas a day   Past Surgical History:  Procedure Laterality Date  . ABDOMINAL HYSTERECTOMY    . BACK SURGERY  1997, 2003  . CYSTECTOMY    . DIAGNOSTIC LAPAROSCOPY    . ESOPHAGOGASTRODUODENOSCOPY (EGD) WITH PROPOFOL N/A 08/19/2011   Performed by Rachael Fee, MD at [facility] ENDOSCOPY  . RIGHT WRIST ARTHROSCOPY WITH DEBRIDEMENT Right 11/13/2015   Performed by Betha Loa, MD at [facility]  . WRIST ARTHROPLASTY Left    Past Medical History:  Diagnosis Date  . Anxiety   . Asthma   . Belchings   . Chronic pain syndrome   . Degenerative joint disease    . Depression   . Disturbance of skin sensation   . Fibromyalgia   . GERD (gastroesophageal reflux disease)   . Hyperlipidemia   . Lumbar post-laminectomy syndrome   . Lumbosacral neuritis   . Osteoarthritis   . Other chronic postoperative pain   . Sciatica    There were no vitals taken for this visit.  Opioid Risk Score:  2 Fall Risk Score:  `1  Depression screen PHQ 2/9  Depression screen [facility] 2/9 07/18/2017 05/23/2017 02/16/2016 10/20/2015 05/22/2015 12/02/2014  Decreased Interest 0 3 0 1 1 1   Down, Depressed, Hopeless 0 3 0 1 1 1   PHQ - 2 Score 0 6 0 2 2 2   Altered sleeping - - - - - 1  Tired, decreased energy - - - - - 1  Change in appetite - - - - - 0  Feeling bad or failure about yourself  - - - - - 0  Trouble concentrating - - - - - 1  Moving slowly or fidgety/restless - - - - - 0  Suicidal thoughts - - - - - 0  PHQ-9 Score - - - - - 5     Review of Systems  HENT: Negative.   Eyes: Negative.   Respiratory: Negative.   Cardiovascular: Negative.   Gastrointestinal: Negative.   Endocrine: Negative.   Genitourinary: Negative.   Musculoskeletal:       Spasms  Skin: Negative.   Allergic/Immunologic: Negative.   Neurological: Positive for dizziness, tremors and weakness.       Tingling  Hematological: Negative.   Psychiatric/Behavioral: Positive for confusion and dysphoric mood. The patient is nervous/anxious.   All other systems reviewed and are negative.      Objective:   Physical Exam  Constitutional: She is oriented to person, place, and time. She appears well-developed and well-nourished.  HENT:  Head: Normocephalic and atraumatic.  Neck: Normal range of motion. Neck supple.  Cervical Paraspinal Tenderness: C-5-C-6  Cardiovascular: Normal rate and regular rhythm.  Pulmonary/Chest: Effort normal and breath sounds normal.  Musculoskeletal:  Normal Muscle Bulk and Muscle Testing Reveals: Upper Extremities: Decreased  ROM 90 Degrees and Muscle Strength  5/5 Bilateral AC Joint Tenderness Thoracic Paraspinal tenderness: T-7-T-9 Lumbar Paraspinal Tenderness: L-3-L-5 Lower Extremities: Full ROM and Muscle Strength 5/5 Arises from Table with ease Narrow Based Gait  Neurological: She is alert and oriented to person, place, and time.  Skin: Skin is warm and dry.  Psychiatric: She has a normal mood and affect.  Nursing note and vitals reviewed.         Assessment & Plan:  1.Lumbar postlaminectomy syndrome status post L5-S1 fusion radiating to LLE: 07/19/2017 Refilled: MS contin 30 mg # 90 pills--use one pill every 8 hours for pain and ContinueTramadol 50 mg BID. #60. We will continue the opioid monitoring program, this consists of regular clinic visits, examinations, urine drug screen, pill counts as well as use of [place] Controlled Substance reporting System. 2. Depression: Continue Current Medication Regimen of Prozac, Latuda and Wellbutrin. PCP following. 07/19/2017 3. Muscle Spasm: Continue Tizanidine. 07/19/2017 4. Sacroiliac Joint Dysfunction: Continue to monitor.07/19/2017 5. Opioid Induces Constipation: RX: Movantik: Appeal submitted awaiting response.   20 minutes of face to face patient care time was spent during this visit. All questions were encouraged and answered.  F/U in 1 month

## 2017-07-19 ENCOUNTER — Telehealth: Payer: Self-pay | Admitting: *Deleted

## 2017-07-19 NOTE — Telephone Encounter (Signed)
Prior Auth initiated for [employer] through [employer].

## 2017-07-19 NOTE — Telephone Encounter (Signed)
[facility] denied Movantik.  Has not tried lactulose.

## 2017-07-20 ENCOUNTER — Telehealth: Payer: Self-pay | Admitting: Registered Nurse

## 2017-07-20 NOTE — Telephone Encounter (Signed)
On 07/20/2017 the [place]CCSR was reviewed no conflict was seen [place] the [place] with multiple prescribers. Cindy Pratt has a signed narcotic contract with our office. If there were any discrepancies this would have been reported to her physician.

## 2017-08-03 ENCOUNTER — Other Ambulatory Visit: Payer: Self-pay | Admitting: *Deleted

## 2017-08-03 NOTE — Telephone Encounter (Signed)
Appeal letter written and faxed, medication has been approved

## 2017-08-17 ENCOUNTER — Encounter: Payer: Self-pay | Admitting: Registered Nurse

## 2017-08-17 ENCOUNTER — Encounter: Payer: Medicare Other | Attending: Registered Nurse | Admitting: Registered Nurse

## 2017-08-17 VITALS — BP 108/73 | HR 65 | Resp 14

## 2017-08-17 DIAGNOSIS — M533 Sacrococcygeal disorders, not elsewhere classified: Secondary | ICD-10-CM

## 2017-08-17 DIAGNOSIS — Z5181 Encounter for therapeutic drug level monitoring: Secondary | ICD-10-CM

## 2017-08-17 DIAGNOSIS — G629 Polyneuropathy, unspecified: Secondary | ICD-10-CM | POA: Insufficient documentation

## 2017-08-17 DIAGNOSIS — M542 Cervicalgia: Secondary | ICD-10-CM

## 2017-08-17 DIAGNOSIS — Z981 Arthrodesis status: Secondary | ICD-10-CM | POA: Insufficient documentation

## 2017-08-17 DIAGNOSIS — Z87891 Personal history of nicotine dependence: Secondary | ICD-10-CM | POA: Diagnosis not present

## 2017-08-17 DIAGNOSIS — M797 Fibromyalgia: Secondary | ICD-10-CM

## 2017-08-17 DIAGNOSIS — Z79899 Other long term (current) drug therapy: Secondary | ICD-10-CM

## 2017-08-17 DIAGNOSIS — M1990 Unspecified osteoarthritis, unspecified site: Secondary | ICD-10-CM | POA: Insufficient documentation

## 2017-08-17 DIAGNOSIS — M5416 Radiculopathy, lumbar region: Secondary | ICD-10-CM

## 2017-08-17 DIAGNOSIS — G894 Chronic pain syndrome: Secondary | ICD-10-CM

## 2017-08-17 DIAGNOSIS — M5412 Radiculopathy, cervical region: Secondary | ICD-10-CM | POA: Diagnosis not present

## 2017-08-17 DIAGNOSIS — F329 Major depressive disorder, single episode, unspecified: Secondary | ICD-10-CM | POA: Diagnosis not present

## 2017-08-17 DIAGNOSIS — E785 Hyperlipidemia, unspecified: Secondary | ICD-10-CM | POA: Insufficient documentation

## 2017-08-17 DIAGNOSIS — G8929 Other chronic pain: Secondary | ICD-10-CM | POA: Diagnosis present

## 2017-08-17 DIAGNOSIS — J45909 Unspecified asthma, uncomplicated: Secondary | ICD-10-CM | POA: Diagnosis not present

## 2017-08-17 DIAGNOSIS — M961 Postlaminectomy syndrome, not elsewhere classified: Secondary | ICD-10-CM | POA: Diagnosis not present

## 2017-08-17 MED ORDER — MORPHINE SULFATE ER 30 MG PO TBCR: 30.0000 mg | EXTENDED_RELEASE_TABLET | Freq: Three times a day (TID) | ORAL | 0 refills | Status: DC

## 2017-08-17 MED ORDER — TOPIRAMATE 25 MG PO TABS: 25.0000 mg | ORAL_TABLET | Freq: Every day | ORAL | 2 refills | Status: DC

## 2017-08-17 NOTE — Progress Notes (Signed)
Subjective:    Patient ID: Cindy Pratt, female    DOB: 27-Aug-1966, 51 y.o.   MRN: [id number]  HPI: Cindy Pratt is a 51year old female who returns for follow up appointmentfor chronic pain and medication refill. She staes her pain is located in her neck radiating into her bilateral shoulders and her neck pain has increased in intensity we will order x-rays she verbalizes understanding. Also reports lower back pain. She rates her pain 7.Her current exercise regime is walking in the home.  Cindy Pratt states she is still trying to find a psychiatrist, [facility], [facility] wouldn't accept her due to the complexity, she can attend counseling. Cindy Pratt will make an appointment for counseling, she was also instructed to call her insurance company for mental health coverage, she verbalizes understanding. Cindy Pratt trying to find psychiatrist to prescribe her antidepressants at this time she states her PCP is writing for medication until she is able to find psychiatrist. Admits to depression no suicidal thoughts.   Cindy Pratt Morphine equivalent is  100.00 MME.  Last UDS was performed on : 02/22/2017, it was consistent.   Husband in room.  Pain Inventory Average Pain 7 Pain Right Now 7 My pain is intermittent, constant, sharp, burning, dull, stabbing, tingling and aching  In the last 24 hours, has pain interfered with the following? General activity 8 Relation with others 8 Enjoyment of life 8 What TIME of day is your pain at its worst? all Sleep (in general) Fair  Pain is worse with: all Pain improves with: medication and . Relief from Meds: 9  Mobility walk without assistance ability to climb steps?  yes do you drive?  yes Do you have any goals in this area?  no  Function Do you have any goals in this area?  no  Neuro/Psych weakness dizziness confusion depression anxiety  Prior Studies Any changes  since last visit?  no  Physicians involved in your care Any changes since last visit?  no   Family History  Problem Relation Age of Onset  . Hypertension Mother   . Hypertension Father   . Heart attack Father   . Breast cancer Maternal Grandmother   . Liver cancer Maternal Grandmother   . Cancer Maternal Grandmother        breast cancer  . Ovarian cancer Cousin   . Cancer Cousin        1st c. maternal side/ cervical cancer  . Cancer Cousin        1st c maternal/ colon cancer  . Diabetes Maternal Grandfather        both sides   Social History   Socioeconomic History  . Marital status: Married    Spouse name: Velda ShellHubert  . Number of children: 4  . Years of education: M  . Highest education level: None  Social Needs  . Financial resource strain: None  . Food insecurity - worry: None  . Food insecurity - inability: None  . Transportation needs - medical: None  . Transportation needs - non-medical: None  Occupational History  . Occupation: disabled    Employer: DISABLE  Tobacco Use  . Smoking status: Former Smoker    Packs/day: 1.00    Years: 20.00    Pack years: 20.00    Types: Cigarettes    Last attempt to quit: 07/31/2007    Years since quitting: 10.0  . Smokeless tobacco: Never  Used  Substance and Sexual Activity  . Alcohol use: No  . Drug use: No  . Sexual activity: Yes    Birth control/protection: Surgical  Other Topics Concern  . None  Social History Narrative   Lives at home with husband Velda ShellHubert   Drinks 4 sodas a day   Past Surgical History:  Procedure Laterality Date  . ABDOMINAL HYSTERECTOMY    . BACK SURGERY  1997, 2003  . CYSTECTOMY    . DIAGNOSTIC LAPAROSCOPY    . WRIST ARTHROPLASTY Left   . WRIST ARTHROSCOPY WITH DEBRIDEMENT Right 11/13/2015   Procedure: RIGHT WRIST ARTHROSCOPY WITH DEBRIDEMENT;  Surgeon: Betha LoaKevin Kuzma, MD;  Location: [facility];  Service: Orthopedics;  Laterality: Right;   Past Medical History:  Diagnosis Date    . Anxiety   . Asthma   . Belchings   . Chronic pain syndrome   . Degenerative joint disease   . Depression   . Disturbance of skin sensation   . Fibromyalgia   . GERD (gastroesophageal reflux disease)   . Hyperlipidemia   . Lumbar post-laminectomy syndrome   . Lumbosacral neuritis   . Osteoarthritis   . Other chronic postoperative pain   . Sciatica    BP 108/73 (BP Location: Right Arm, Patient Position: Sitting, Cuff Size: Normal)   Pulse 65   Resp 14   SpO2 98%   Opioid Risk Score:  2 Fall Risk Score:  `1  Depression screen PHQ 2/9  [facility] 07/18/2017 05/23/2017 02/16/2016 10/20/2015 05/22/2015 12/02/2014  Decreased Interest 0 3 0 1 1 1   Down, Depressed, Hopeless 0 3 0 1 1 1   PHQ - 2 Score 0 6 0 2 2 2   Altered sleeping - - - - - 1  Tired, decreased energy - - - - - 1  Change in appetite - - - - - 0  Feeling bad or failure about yourself  - - - - - 0  Trouble concentrating - - - - - 1  Moving slowly or fidgety/restless - - - - - 0  Suicidal thoughts - - - - - 0  PHQ-9 Score - - - - - 5     Review of Systems  HENT: Negative.   Eyes: Negative.   Respiratory: Negative.   Cardiovascular: Negative.   Gastrointestinal: Negative.   Endocrine: Negative.   Genitourinary: Negative.   Musculoskeletal: Positive for arthralgias, back pain, myalgias and neck pain. Negative for joint swelling.       Spasms  Skin: Negative.   Allergic/Immunologic: Negative.   Neurological: Positive for weakness.  Hematological: Negative.   Psychiatric/Behavioral: Positive for confusion and dysphoric mood. The patient is nervous/anxious.   All other systems reviewed and are negative.      Objective:   Physical Exam  Constitutional: She is oriented to person, place, and time. She appears well-developed and well-nourished.  HENT:  Head: Normocephalic and atraumatic.  Neck: Normal range of motion. Neck supple.  Cervical Paraspinal Tenderness: C-5-C-6  Cardiovascular:  Normal rate and regular rhythm.  Pulmonary/Chest: Effort normal and breath sounds normal.  Musculoskeletal:  Normal Muscle Bulk and Muscle Testing Reveals: Upper Extremities: Full  ROM  and Muscle Strength 5/5 Right AC Joint Tenderness Thoracic Paraspinal tenderness: T-1-T-3 Lumbar Paraspinal Tenderness: L-3-L-5 Lower Extremities: Full ROM and Muscle Strength 5/5 Arises from Table with ease Narrow Based Gait  Neurological: She is alert and oriented to person, place, and time.  Skin: Skin is warm and dry.  Psychiatric: She has a normal mood and affect.  Nursing note and vitals reviewed.         Assessment & Plan:  1.Lumbar postlaminectomy syndrome status post L5-S1 fusion radiating to LLE: 08/17/2017 Refilled: MS contin 30 mg # 90 pills--use one pill every 8 hours for pain and ContinueTramadol 50 mg BID. #60. We will continue the opioid monitoring program, this consists of regular clinic visits, examinations, urine drug screen, pill counts as well as use of [place] Controlled Substance reporting System. 2. Depression: Continue Current Medication Regimen of Prozac, Latuda and Wellbutrin. PCP following. 08/17/2017 3. Muscle Spasm: Continue Tizanidine. 08/17/2017 4. Sacroiliac Joint Dysfunction: Continue to monitor.08/17/2017 5. Opioid Induces Constipation: Continue  Movantik 08/17/2017 6. Cervicalgia: RX: Cervical X-rays 7. Cervical Radiculitis: Continue to Monitor: Allergic: Gabapentin, Lyrica and Cymbalta.   20 minutes of face to face patient care time was spent during this visit. All questions were encouraged and answered.  F/U in 1 month

## 2017-09-19 ENCOUNTER — Encounter: Payer: Self-pay | Admitting: Registered Nurse

## 2017-09-19 ENCOUNTER — Encounter: Payer: Medicare Other | Attending: Registered Nurse | Admitting: Registered Nurse

## 2017-09-19 VITALS — BP 96/67 | HR 85

## 2017-09-19 DIAGNOSIS — Z79899 Other long term (current) drug therapy: Secondary | ICD-10-CM | POA: Diagnosis not present

## 2017-09-19 DIAGNOSIS — F329 Major depressive disorder, single episode, unspecified: Secondary | ICD-10-CM | POA: Insufficient documentation

## 2017-09-19 DIAGNOSIS — M5412 Radiculopathy, cervical region: Secondary | ICD-10-CM

## 2017-09-19 DIAGNOSIS — M5416 Radiculopathy, lumbar region: Secondary | ICD-10-CM

## 2017-09-19 DIAGNOSIS — G629 Polyneuropathy, unspecified: Secondary | ICD-10-CM | POA: Diagnosis not present

## 2017-09-19 DIAGNOSIS — Z981 Arthrodesis status: Secondary | ICD-10-CM | POA: Insufficient documentation

## 2017-09-19 DIAGNOSIS — G8929 Other chronic pain: Secondary | ICD-10-CM | POA: Diagnosis present

## 2017-09-19 DIAGNOSIS — Z87891 Personal history of nicotine dependence: Secondary | ICD-10-CM | POA: Insufficient documentation

## 2017-09-19 DIAGNOSIS — G894 Chronic pain syndrome: Secondary | ICD-10-CM | POA: Diagnosis not present

## 2017-09-19 DIAGNOSIS — M1990 Unspecified osteoarthritis, unspecified site: Secondary | ICD-10-CM | POA: Diagnosis not present

## 2017-09-19 DIAGNOSIS — M542 Cervicalgia: Secondary | ICD-10-CM

## 2017-09-19 DIAGNOSIS — J45909 Unspecified asthma, uncomplicated: Secondary | ICD-10-CM | POA: Diagnosis not present

## 2017-09-19 DIAGNOSIS — M797 Fibromyalgia: Secondary | ICD-10-CM | POA: Insufficient documentation

## 2017-09-19 DIAGNOSIS — M62838 Other muscle spasm: Secondary | ICD-10-CM | POA: Diagnosis not present

## 2017-09-19 DIAGNOSIS — E785 Hyperlipidemia, unspecified: Secondary | ICD-10-CM | POA: Diagnosis not present

## 2017-09-19 DIAGNOSIS — Z5181 Encounter for therapeutic drug level monitoring: Secondary | ICD-10-CM

## 2017-09-19 DIAGNOSIS — M961 Postlaminectomy syndrome, not elsewhere classified: Secondary | ICD-10-CM

## 2017-09-19 MED ORDER — TRAMADOL HCL 50 MG PO TABS: 50.0000 mg | ORAL_TABLET | Freq: Two times a day (BID) | ORAL | 2 refills | Status: DC | PRN

## 2017-09-19 MED ORDER — MORPHINE SULFATE ER 30 MG PO TBCR: 30.0000 mg | EXTENDED_RELEASE_TABLET | Freq: Three times a day (TID) | ORAL | 0 refills | Status: DC

## 2017-09-19 NOTE — Patient Instructions (Signed)
Tizanidine : May increase to three times a day as needed.  Call office in a week to evaluate medication change

## 2017-09-19 NOTE — Progress Notes (Signed)
Subjective:    Patient ID: Cindy Pratt, female    DOB: 06-09-1966, 52 y.o.   MRN: [id number]  HPI: [name] is a 52year old female who returns for follow up appointmentfor chronic pain and medication refill. She states her pain is located in her neck, lower back pain radiating into her bilateral hips and left lower extremities. She rates her pain 7. Her current exercise regime is walking.   Ms. Bronaugh Morphine equivalent is  100.00 MME.  Last UDS was performed on : 02/22/2017, it was consistent. Oral Swab was Performed Today.   Husband in room.  Pain Inventory Average Pain 9 Pain Right Now 7 My pain is intermittent, constant, sharp, burning, dull, stabbing, tingling and aching  In the last 24 hours, has pain interfered with the following? General activity 8 Relation with others 8 Enjoyment of life 8 What TIME of day is your pain at its worst? all Sleep (in general) Fair  Pain is worse with: all Pain improves with: medication and . Relief from Meds: 9  Mobility walk without assistance ability to climb steps?  yes do you drive?  yes Do you have any goals in this area?  no  Function Do you have any goals in this area?  no  Neuro/Psych weakness dizziness confusion depression anxiety  Prior Studies Any changes since last visit?  no  Physicians involved in your care Any changes since last visit?  no   Family History  Problem Relation Age of Onset  . Hypertension Mother   . Hypertension Father   . Heart attack Father   . Breast cancer Maternal Grandmother   . Liver cancer Maternal Grandmother   . Cancer Maternal Grandmother        breast cancer  . Ovarian cancer Cousin   . Cancer Cousin        1st c. maternal side/ cervical cancer  . Cancer Cousin        1st c maternal/ colon cancer  . Diabetes Maternal Grandfather        both sides   Social History   Socioeconomic History  . Marital status: Married    Spouse name: Cindy Pratt  . Number  of children: 4  . Years of education: M  . Highest education level: None  Social Needs  . Financial resource strain: None  . Food insecurity - worry: None  . Food insecurity - inability: None  . Transportation needs - medical: None  . Transportation needs - non-medical: None  Occupational History  . Occupation: disabled    Employer: DISABLE  Tobacco Use  . Smoking status: Former Smoker    Packs/day: 1.00    Years: 20.00    Pack years: 20.00    Types: Cigarettes    Last attempt to quit: 07/31/2007    Years since quitting: 10.1  . Smokeless tobacco: Never Used  Substance and Sexual Activity  . Alcohol use: No  . Drug use: No  . Sexual activity: Yes    Birth control/protection: Surgical  Other Topics Concern  . None  Social History Narrative   Lives at home with husband Cindy Pratt   Drinks 4 sodas a day   Past Surgical History:  Procedure Laterality Date  . ABDOMINAL HYSTERECTOMY    . BACK SURGERY  1997, 2003  . CYSTECTOMY    . DIAGNOSTIC LAPAROSCOPY    . WRIST ARTHROPLASTY Left   . WRIST ARTHROSCOPY WITH DEBRIDEMENT Right 11/13/2015   Procedure: RIGHT WRIST  ARTHROSCOPY WITH DEBRIDEMENT;  Surgeon: Betha Loa, MD;  Location: [facility];  Service: Orthopedics;  Laterality: Right;   Past Medical History:  Diagnosis Date  . Anxiety   . Asthma   . Belchings   . Chronic pain syndrome   . Degenerative joint disease   . Depression   . Disturbance of skin sensation   . Fibromyalgia   . GERD (gastroesophageal reflux disease)   . Hyperlipidemia   . Lumbar post-laminectomy syndrome   . Lumbosacral neuritis   . Osteoarthritis   . Other chronic postoperative pain   . Sciatica    Pulse 85   SpO2 98%   Opioid Risk Score:  2 Fall Risk Score:  `1  Depression screen PHQ 2/9  Depression screen [facility] 2/9 07/18/2017 05/23/2017 02/16/2016 10/20/2015 05/22/2015 12/02/2014  Decreased Interest 0 3 0 1 1 1   Down, Depressed, Hopeless 0 3 0 1 1 1   PHQ - 2 Score 0 6 0 2 2 2     Altered sleeping - - - - - 1  Tired, decreased energy - - - - - 1  Change in appetite - - - - - 0  Feeling bad or failure about yourself  - - - - - 0  Trouble concentrating - - - - - 1  Moving slowly or fidgety/restless - - - - - 0  Suicidal thoughts - - - - - 0  PHQ-9 Score - - - - - 5  Some recent data might be hidden     Review of Systems  HENT: Negative.   Eyes: Negative.   Respiratory: Negative.   Cardiovascular: Negative.   Gastrointestinal: Negative.   Endocrine: Negative.   Genitourinary: Negative.   Musculoskeletal: Positive for arthralgias, back pain, myalgias and neck pain. Negative for joint swelling.       Spasms  Skin: Negative.   Allergic/Immunologic: Negative.   Neurological: Negative.   Hematological: Negative.   Psychiatric/Behavioral: Negative.   All other systems reviewed and are negative.      Objective:   Physical Exam  Constitutional: She is oriented to person, place, and time. She appears well-developed and well-nourished.  HENT:  Head: Normocephalic and atraumatic.  Neck: Normal range of motion. Neck supple.  Cervical Paraspinal Tenderness: C-5-C-6  Cardiovascular: Normal rate and regular rhythm.  Pulmonary/Chest: Effort normal and breath sounds normal.  Musculoskeletal:  Normal Muscle Bulk and Muscle Testing Reveals:  Upper Extremities: Full  ROM  and Muscle Strength 5/5 Bilateral  AC Joint Tenderness Thoracic Paraspinal tenderness: T-1-T-3 Lumbar Paraspinal Tenderness: L-3-L-5 Lower Extremities: Full ROM and Muscle Strength 5/5 Arises from Table with ease Narrow Based Gait  Neurological: She is alert and oriented to person, place, and time.  Skin: Skin is warm and dry.  Psychiatric: She has a normal mood and affect.  Nursing note and vitals reviewed.         Assessment & Plan:  1.Lumbar postlaminectomy syndrome status post L5-S1 fusion radiating to LLE: Left Lumbar Radiculitis: Continue Topamax.  09/19/2017 Refilled: MS contin  30 mg # 90 pills--use one pill every 8 hours for pain and ContinueTramadol 50 mg BID. #60. We will continue the opioid monitoring program, this consists of regular clinic visits, examinations, urine drug screen, pill counts as well as use of [place] Controlled Substance reporting System. 2. Depression: Continue Current Medication Regimen of Prozac, Latuda and Wellbutrin. PCP following. 09/19/2017 3. Muscle Spasm: Continue Tizanidine. 09/19/2017 4. Sacroiliac Joint Dysfunction: Continue to monitor.09/19/2017 5. Opioid Induces  Constipation: Continue  To Monitor 09/19/2017 6. Cervicalgia/  Cervical Radiculitis:  Continue Topamax. Continue to Monitor: Allergic: Gabapentin, Lyrica and Cymbalta. 09/19/2017 7. Fibromyalgia Syndrome: Continue Topamax and Continue HEP. 09/19/2017  20 minutes of face to face patient care time was spent during this visit. All questions were encouraged and answered.  F/U in 1 month

## 2017-09-23 LAB — DRUG TOX MONITOR 1 W/CONF, ORAL FLD
AMPHETAMINES: NEGATIVE ng/mL (ref <10)
Barbiturates: NEGATIVE ng/mL (ref <10)
Benzodiazepines: NEGATIVE ng/mL (ref <0.50)
Buprenorphine: NEGATIVE ng/mL (ref <0.10)
CODEINE: NEGATIVE ng/mL (ref <2.5)
Cocaine: NEGATIVE ng/mL (ref <5.0)
DIHYDROCODEINE: NEGATIVE ng/mL (ref <2.5)
Fentanyl: NEGATIVE ng/mL (ref <0.10)
HYDROCODONE: NEGATIVE ng/mL (ref <2.5)
Heroin Metabolite: NEGATIVE ng/mL (ref <1.0)
Hydromorphone: NEGATIVE ng/mL (ref <2.5)
MARIJUANA: NEGATIVE ng/mL (ref <2.5)
MDMA: NEGATIVE ng/mL (ref <10)
Meprobamate: NEGATIVE ng/mL (ref <2.5)
Methadone: NEGATIVE ng/mL (ref <5.0)
Morphine: 16.7 ng/mL — ABNORMAL HIGH (ref <2.5)
Nicotine Metabolite: NEGATIVE ng/mL (ref <5.0)
Norhydrocodone: NEGATIVE ng/mL (ref <2.5)
Noroxycodone: NEGATIVE ng/mL (ref <2.5)
OPIATES: POSITIVE ng/mL — AB (ref <2.5)
Oxycodone: NEGATIVE ng/mL (ref <2.5)
Oxymorphone: NEGATIVE ng/mL (ref <2.5)
PHENCYCLIDINE: NEGATIVE ng/mL (ref <10)
TAPENTADOL: NEGATIVE ng/mL (ref <5.0)
Tramadol: POSITIVE ng/mL — AB (ref <5.0)
ZOLPIDEM: NEGATIVE ng/mL (ref <5.0)

## 2017-09-23 LAB — DRUG TOX ALC METAB W/CON, ORAL FLD: ALCOHOL METABOLITE: NEGATIVE ng/mL (ref <25)

## 2017-09-27 ENCOUNTER — Telehealth: Payer: Self-pay | Admitting: *Deleted

## 2017-09-27 NOTE — Telephone Encounter (Signed)
Oral swab drug screen was consistent for prescribed medications.  ?

## 2017-10-10 ENCOUNTER — Ambulatory Visit
Admission: RE | Admit: 2017-10-10 | Discharge: 2017-10-10 | Disposition: A | Discharge status: Home / Self Care | Payer: Medicare Other | Source: Ambulatory Visit | Attending: Registered Nurse | Admitting: Registered Nurse

## 2017-10-10 ENCOUNTER — Other Ambulatory Visit: Payer: Self-pay

## 2017-10-10 ENCOUNTER — Encounter: Payer: Medicare Other | Attending: Registered Nurse | Admitting: Registered Nurse

## 2017-10-10 ENCOUNTER — Encounter: Payer: Self-pay | Admitting: Registered Nurse

## 2017-10-10 VITALS — BP 118/79 | HR 65

## 2017-10-10 DIAGNOSIS — E785 Hyperlipidemia, unspecified: Secondary | ICD-10-CM | POA: Diagnosis not present

## 2017-10-10 DIAGNOSIS — F329 Major depressive disorder, single episode, unspecified: Secondary | ICD-10-CM | POA: Diagnosis not present

## 2017-10-10 DIAGNOSIS — M62838 Other muscle spasm: Secondary | ICD-10-CM

## 2017-10-10 DIAGNOSIS — M5416 Radiculopathy, lumbar region: Secondary | ICD-10-CM

## 2017-10-10 DIAGNOSIS — M542 Cervicalgia: Secondary | ICD-10-CM

## 2017-10-10 DIAGNOSIS — Z5181 Encounter for therapeutic drug level monitoring: Secondary | ICD-10-CM

## 2017-10-10 DIAGNOSIS — M533 Sacrococcygeal disorders, not elsewhere classified: Secondary | ICD-10-CM

## 2017-10-10 DIAGNOSIS — M961 Postlaminectomy syndrome, not elsewhere classified: Secondary | ICD-10-CM | POA: Diagnosis not present

## 2017-10-10 DIAGNOSIS — Z79899 Other long term (current) drug therapy: Secondary | ICD-10-CM | POA: Diagnosis not present

## 2017-10-10 DIAGNOSIS — G8929 Other chronic pain: Secondary | ICD-10-CM | POA: Diagnosis present

## 2017-10-10 DIAGNOSIS — G894 Chronic pain syndrome: Secondary | ICD-10-CM

## 2017-10-10 DIAGNOSIS — M797 Fibromyalgia: Secondary | ICD-10-CM

## 2017-10-10 DIAGNOSIS — M1990 Unspecified osteoarthritis, unspecified site: Secondary | ICD-10-CM | POA: Diagnosis not present

## 2017-10-10 DIAGNOSIS — J45909 Unspecified asthma, uncomplicated: Secondary | ICD-10-CM | POA: Diagnosis not present

## 2017-10-10 DIAGNOSIS — Z981 Arthrodesis status: Secondary | ICD-10-CM | POA: Diagnosis not present

## 2017-10-10 DIAGNOSIS — Z87891 Personal history of nicotine dependence: Secondary | ICD-10-CM | POA: Diagnosis not present

## 2017-10-10 DIAGNOSIS — M5412 Radiculopathy, cervical region: Secondary | ICD-10-CM | POA: Diagnosis not present

## 2017-10-10 DIAGNOSIS — G629 Polyneuropathy, unspecified: Secondary | ICD-10-CM | POA: Diagnosis not present

## 2017-10-10 MED ORDER — MORPHINE SULFATE ER 30 MG PO TBCR: 30.0000 mg | EXTENDED_RELEASE_TABLET | Freq: Three times a day (TID) | ORAL | 0 refills | Status: DC

## 2017-10-10 MED ORDER — TIZANIDINE HCL 4 MG PO TABS: 4.0000 mg | ORAL_TABLET | Freq: Two times a day (BID) | ORAL | 5 refills | Status: DC

## 2017-10-10 NOTE — Progress Notes (Signed)
Subjective:    Patient ID: Cindy Pratt, female    DOB: Dec 14, 1965, 52 y.o.   MRN: [id number]  HPI: Cindy Pratt is a 52year old female who returns for follow up appointmentfor chronic pain and medication refill. She states her pain is located in her neck radiating into her left shoulder, left arm with tingling and burning, also reports lower back pain radiating into her buttocks, left hip, and left lower extremity. She rates her pain 8. Her current exercise regime is walking.   Ms. Langer Morphine equivalent is  96.67 MME.  Oral Swab  was performed on : 09/19/2017, it was consistent.  Pain Inventory Average Pain 8 Pain Right Now 8 My pain is intermittent, constant, sharp, burning, dull, stabbing, tingling and aching  In the last 24 hours, has pain interfered with the following? General activity 8 Relation with others 8 Enjoyment of life 8 What TIME of day is your pain at its worst? all Sleep (in general) Fair  Pain is worse with: all Pain improves with: medication and . Relief from Meds: 9  Mobility Do you have any goals in this area?  no  Function Do you have any goals in this area?  no  Neuro/Psych weakness dizziness confusion depression anxiety  Prior Studies Any changes since last visit?  no  Physicians involved in your care Any changes since last visit?  no   Family History  Problem Relation Age of Onset  . Hypertension Mother   . Hypertension Father   . Heart attack Father   . Breast cancer Maternal Grandmother   . Liver cancer Maternal Grandmother   . Cancer Maternal Grandmother        breast cancer  . Ovarian cancer Cousin   . Cancer Cousin        1st c. maternal side/ cervical cancer  . Cancer Cousin        1st c maternal/ colon cancer  . Diabetes Maternal Grandfather        both sides   Social History   Socioeconomic History  . Marital status: Married    Spouse name: [name]  . Number of children: 4  . Years of education: M    . Highest education level: None  Social Needs  . Financial resource strain: None  . Food insecurity - worry: None  . Food insecurity - inability: None  . Transportation needs - medical: None  . Transportation needs - non-medical: None  Occupational History  . Occupation: disabled    Employer: DISABLE  Tobacco Use  . Smoking status: Former Smoker    Packs/day: 1.00    Years: 20.00    Pack years: 20.00    Types: Cigarettes    Last attempt to quit: 07/31/2007    Years since quitting: 10.2  . Smokeless tobacco: Never Used  Substance and Sexual Activity  . Alcohol use: No  . Drug use: No  . Sexual activity: Yes    Birth control/protection: Surgical  Other Topics Concern  . None  Social History Narrative   Lives at home with husband [name]   Drinks 4 sodas a day   Past Surgical History:  Procedure Laterality Date  . ABDOMINAL HYSTERECTOMY    . BACK SURGERY  1997, 2003  . CYSTECTOMY    . DIAGNOSTIC LAPAROSCOPY    . WRIST ARTHROPLASTY Left   . WRIST ARTHROSCOPY WITH DEBRIDEMENT Right 11/13/2015   Procedure: RIGHT WRIST ARTHROSCOPY WITH DEBRIDEMENT;  Surgeon: Betha Loa, MD;  Location: [facility];  Service: Orthopedics;  Laterality: Right;   Past Medical History:  Diagnosis Date  . Anxiety   . Asthma   . Belchings   . Chronic pain syndrome   . Degenerative joint disease   . Depression   . Disturbance of skin sensation   . Fibromyalgia   . GERD (gastroesophageal reflux disease)   . Hyperlipidemia   . Lumbar post-laminectomy syndrome   . Lumbosacral neuritis   . Osteoarthritis   . Other chronic postoperative pain   . Sciatica    BP 118/79   Pulse 65   SpO2 98%   Opioid Risk Score:  2 Fall Risk Score:  `1  Depression screen PHQ 2/9  Depression screen [facility] 2/9 10/10/2017 07/18/2017 05/23/2017 02/16/2016 10/20/2015 05/22/2015 12/02/2014  Decreased Interest 3 0 3 0 1 1 1   Down, Depressed, Hopeless 3 0 3 0 1 1 1   PHQ - 2 Score 6 0 6 0 2 2 2   Altered  sleeping - - - - - - 1  Tired, decreased energy - - - - - - 1  Change in appetite - - - - - - 0  Feeling bad or failure about yourself  - - - - - - 0  Trouble concentrating - - - - - - 1  Moving slowly or fidgety/restless - - - - - - 0  Suicidal thoughts - - - - - - 0  PHQ-9 Score - - - - - - 5  Some recent data might be hidden     Review of Systems  HENT: Negative.   Eyes: Negative.   Respiratory: Negative.   Cardiovascular: Negative.   Gastrointestinal: Negative.   Endocrine: Negative.   Genitourinary: Negative.   Musculoskeletal: Positive for arthralgias, back pain, myalgias and neck pain. Negative for joint swelling.       Spasms  Skin: Negative.   Allergic/Immunologic: Negative.   Neurological: Negative.   Hematological: Negative.   Psychiatric/Behavioral: Negative.   All other systems reviewed and are negative.      Objective:   Physical Exam  Constitutional: She is oriented to person, place, and time. She appears well-developed and well-nourished.  HENT:  Head: Normocephalic and atraumatic.  Neck: Normal range of motion. Neck supple.  Cervical Paraspinal Tenderness: C-5-C-6  Cardiovascular: Normal rate and regular rhythm.  Pulmonary/Chest: Effort normal and breath sounds normal.  Musculoskeletal:  Normal Muscle Bulk and Muscle Testing Reveals:  Upper Extremities: Full ROM  and Muscle Strength 5/5 Bilateral  AC Joint Tenderness: L>R Lumbar Hypersensitivity Lower Extremities: Right: Full ROM and Muscle Strength 5/5 Left: Decreased ROM and Muscle Strength 5/5 Left Lower Extremity Flexion Produces Pain into Left Buttock and Left Lower Extremity. Arises from Table with ease Narrow Based Gait  Neurological: She is alert and oriented to person, place, and time.  Skin: Skin is warm and dry.  Psychiatric: She has a normal mood and affect.  Nursing note and vitals reviewed.         Assessment & Plan:  1.Lumbar postlaminectomy syndrome status post L5-S1 fusion  radiating to LLE: Left Lumbar Radiculitis: Continue Topamax.  10/10/2017 Refilled: MS contin 30 mg # 90 pills--use one pill every 8 hours for pain and ContinueTramadol 50 mg BID. #60. We will continue the opioid monitoring program, this consists of regular clinic visits, examinations, urine drug screen, pill counts as well as use of [place] Controlled Substance reporting System. 2. Depression: Continue Current Medication Regimen of Prozac, Latuda  and Wellbutrin. PCP following. 10/10/2017 3. Muscle Spasm: Continue Tizanidine. 10/10/2017 4. Sacroiliac Joint Dysfunction: Continue to monitor.10/10/2017 5. Opioid Induces Constipation: Continue  To Monitor 10/10/2017 6. Cervicalgia/  Cervical Radiculitis:  Continue Topamax. Continue to Monitor: Allergic: Gabapentin, Lyrica and Cymbalta. 10/10/2017 7. Fibromyalgia Syndrome: Continue Topamax and Continue HEP. 10/10/2017  20 minutes of face to face patient care time was spent during this visit. All questions were encouraged and answered.  F/U in 1 month

## 2017-11-07 ENCOUNTER — Encounter: Payer: Medicare Other | Attending: Registered Nurse | Admitting: Registered Nurse

## 2017-11-07 ENCOUNTER — Other Ambulatory Visit: Payer: Self-pay

## 2017-11-07 ENCOUNTER — Encounter: Payer: Self-pay | Admitting: Registered Nurse

## 2017-11-07 VITALS — BP 109/73 | HR 73

## 2017-11-07 DIAGNOSIS — M961 Postlaminectomy syndrome, not elsewhere classified: Secondary | ICD-10-CM

## 2017-11-07 DIAGNOSIS — M797 Fibromyalgia: Secondary | ICD-10-CM | POA: Diagnosis not present

## 2017-11-07 DIAGNOSIS — G629 Polyneuropathy, unspecified: Secondary | ICD-10-CM | POA: Insufficient documentation

## 2017-11-07 DIAGNOSIS — G8929 Other chronic pain: Secondary | ICD-10-CM | POA: Diagnosis present

## 2017-11-07 DIAGNOSIS — Z5181 Encounter for therapeutic drug level monitoring: Secondary | ICD-10-CM | POA: Diagnosis not present

## 2017-11-07 DIAGNOSIS — Z87891 Personal history of nicotine dependence: Secondary | ICD-10-CM | POA: Insufficient documentation

## 2017-11-07 DIAGNOSIS — J45909 Unspecified asthma, uncomplicated: Secondary | ICD-10-CM | POA: Diagnosis not present

## 2017-11-07 DIAGNOSIS — M5412 Radiculopathy, cervical region: Secondary | ICD-10-CM | POA: Diagnosis not present

## 2017-11-07 DIAGNOSIS — G894 Chronic pain syndrome: Secondary | ICD-10-CM | POA: Diagnosis not present

## 2017-11-07 DIAGNOSIS — M1990 Unspecified osteoarthritis, unspecified site: Secondary | ICD-10-CM | POA: Diagnosis not present

## 2017-11-07 DIAGNOSIS — E785 Hyperlipidemia, unspecified: Secondary | ICD-10-CM | POA: Insufficient documentation

## 2017-11-07 DIAGNOSIS — M62838 Other muscle spasm: Secondary | ICD-10-CM | POA: Diagnosis not present

## 2017-11-07 DIAGNOSIS — M542 Cervicalgia: Secondary | ICD-10-CM

## 2017-11-07 DIAGNOSIS — F329 Major depressive disorder, single episode, unspecified: Secondary | ICD-10-CM | POA: Insufficient documentation

## 2017-11-07 DIAGNOSIS — Z981 Arthrodesis status: Secondary | ICD-10-CM | POA: Insufficient documentation

## 2017-11-07 DIAGNOSIS — Z79899 Other long term (current) drug therapy: Secondary | ICD-10-CM | POA: Diagnosis not present

## 2017-11-07 MED ORDER — TOPIRAMATE 25 MG PO TABS: 25.0000 mg | ORAL_TABLET | Freq: Every day | ORAL | 2 refills | Status: DC

## 2017-11-07 MED ORDER — TRAMADOL HCL 50 MG PO TABS: 50.0000 mg | ORAL_TABLET | Freq: Two times a day (BID) | ORAL | 2 refills | Status: DC | PRN

## 2017-11-07 MED ORDER — MORPHINE SULFATE ER 30 MG PO TBCR: 30.0000 mg | EXTENDED_RELEASE_TABLET | Freq: Three times a day (TID) | ORAL | 0 refills | Status: DC

## 2017-11-07 NOTE — Progress Notes (Signed)
Subjective:    Patient ID: Cindy Pratt, female    DOB: 22-Jan-1966, 52 y.o.   MRN: [id number]  HPI: Cindy Pratt is a [age]year old female who returns for follow up appointmentfor chronic pain and medication refill. She states her pain is located in her neck radiating into her bilateral shoulders and lower back. She rates her pain 8. Her current exercise regime is walking.  Cindy Pratt Dr. Yevette Edwardsumonski ordered a Cervical MRI, she's waiting on a scheduled appointment.   Cindy Pratt Morphine equivalent is  106.33 MME.  Oral Swab  was performed on : 09/19/2017, it was consistent.  Pain Inventory Average Pain 8 Pain Right Now 8 My pain is intermittent, constant, sharp, burning, dull, stabbing, tingling and aching  In the last 24 hours, has pain interfered with the following? General activity 8 Relation with others 8 Enjoyment of life 8 What TIME of day is your pain at its worst? all Sleep (in general) Fair  Pain is worse with: all Pain improves with: medication and . Relief from Meds: 8  Mobility Do you have any goals in this area?  no  Function Do you have any goals in this area?  no  Neuro/Psych weakness dizziness confusion depression anxiety  Prior Studies Any changes since last visit?  no  Physicians involved in your care Any changes since last visit?  no   Family History  Problem Relation Age of Onset  . Hypertension Mother   . Hypertension Father   . Heart attack Father   . Breast cancer Maternal Grandmother   . Liver cancer Maternal Grandmother   . Cancer Maternal Grandmother        breast cancer  . Ovarian cancer Cousin   . Cancer Cousin        1st c. maternal side/ cervical cancer  . Cancer Cousin        1st c maternal/ colon cancer  . Diabetes Maternal Grandfather        both sides   Social History   Socioeconomic History  . Marital status: Married    Spouse name: Velda ShellHubert  . Number of children: 4  . Years of education: M  . Highest  education level: None  Social Needs  . Financial resource strain: None  . Food insecurity - worry: None  . Food insecurity - inability: None  . Transportation needs - medical: None  . Transportation needs - non-medical: None  Occupational History  . Occupation: disabled    Employer: DISABLE  Tobacco Use  . Smoking status: Former Smoker    Packs/day: 1.00    Years: 20.00    Pack years: 20.00    Types: Cigarettes    Last attempt to quit: 07/31/2007    Years since quitting: 10.2  . Smokeless tobacco: Never Used  Substance and Sexual Activity  . Alcohol use: No  . Drug use: No  . Sexual activity: Yes    Birth control/protection: Surgical  Other Topics Concern  . None  Social History Narrative   Lives at home with husband Velda ShellHubert   Drinks 4 sodas a day   Past Surgical History:  Procedure Laterality Date  . ABDOMINAL HYSTERECTOMY    . BACK SURGERY  1997, 2003  . CYSTECTOMY    . DIAGNOSTIC LAPAROSCOPY    . WRIST ARTHROPLASTY Left   . WRIST ARTHROSCOPY WITH DEBRIDEMENT Right 11/13/2015   Procedure: RIGHT WRIST ARTHROSCOPY WITH DEBRIDEMENT;  Surgeon: Betha LoaKevin Kuzma, MD;  Location: [facility];  Service: Orthopedics;  Laterality: Right;   Past Medical History:  Diagnosis Date  . Anxiety   . Asthma   . Belchings   . Chronic pain syndrome   . Degenerative joint disease   . Depression   . Disturbance of skin sensation   . Fibromyalgia   . GERD (gastroesophageal reflux disease)   . Hyperlipidemia   . Lumbar post-laminectomy syndrome   . Lumbosacral neuritis   . Osteoarthritis   . Other chronic postoperative pain   . Sciatica    BP 109/73   Pulse 73   SpO2 97%   Opioid Risk Score:  2 Fall Risk Score:  `1  Depression screen PHQ 2/9  Depression screen [facility] 2/9 11/07/2017 10/10/2017 07/18/2017 05/23/2017 02/16/2016 10/20/2015 05/22/2015  Decreased Interest 3 3 0 3 0 1 1  Down, Depressed, Hopeless 3 3 0 3 0 1 1  PHQ - 2 Score 6 6 0 6 0 2 2  Altered sleeping - - -  - - - -  Tired, decreased energy - - - - - - -  Change in appetite - - - - - - -  Feeling bad or failure about yourself  - - - - - - -  Trouble concentrating - - - - - - -  Moving slowly or fidgety/restless - - - - - - -  Suicidal thoughts - - - - - - -  PHQ-9 Score - - - - - - -  Some recent data might be hidden     Review of Systems  HENT: Negative.   Eyes: Negative.   Respiratory: Negative.   Cardiovascular: Negative.   Gastrointestinal: Negative.   Endocrine: Negative.   Genitourinary: Negative.   Musculoskeletal: Positive for arthralgias, back pain, myalgias and neck pain. Negative for joint swelling.       Spasms  Skin: Negative.   Allergic/Immunologic: Negative.   Neurological: Negative.   Hematological: Negative.   Psychiatric/Behavioral: Negative.   All other systems reviewed and are negative.      Objective:   Physical Exam  Constitutional: She is oriented to person, place, and time. She appears well-developed and well-nourished.  HENT:  Head: Normocephalic and atraumatic.  Neck: Normal range of motion. Neck supple.  Cervical Paraspinal Tenderness: C-5-C-6  Cardiovascular: Normal rate and regular rhythm.  Pulmonary/Chest: Effort normal and breath sounds normal.  Musculoskeletal:  Normal Muscle Bulk and Muscle Testing Reveals:  Upper Extremities: Decreased ROM 90 Degrees  and Muscle Strength 4/5 Bilateral AC Joint Tenderness Lumbar Paraspinal Tenderness: L-4-L-5 Lower Extremities: Full ROM and Muscle Strength 5/5 Arises from Table with ease Narrow Based Gait  Neurological: She is alert and oriented to person, place, and time.  Skin: Skin is warm and dry.  Psychiatric: She has a normal mood and affect.  Nursing note and vitals reviewed.         Assessment & Plan:  1.Lumbar postlaminectomy syndrome status post L5-S1 fusion radiating to LLE: Left Lumbar Radiculitis: Continue Topamax.  11/07/2017 Refilled: MS contin 30 mg # 90 pills--use one pill every  8 hours for pain and ContinueTramadol 50 mg BID. #60. We will continue the opioid monitoring program, this consists of regular clinic visits, examinations, urine drug screen, pill counts as well as use of [place] Controlled Substance reporting System. 2. Depression: Continue Current Medication Regimen of Prozac, Latuda and Wellbutrin. PCP following. 11/07/2017 3. Muscle Spasm: Continue Tizanidine. 11/07/2017 4. Sacroiliac Joint Dysfunction: Continue to monitor.11/07/2017 5. Opioid Induces Constipation:  Continue  To Monitor 11/07/2017 6. Cervicalgia/  Cervical Radiculitis: Dr. Yevette Edwards Following: MRI Pending:   Continue Topamax. Continue to Monitor: Allergic: Gabapentin, Lyrica and Cymbalta. 11/07/2017 7. Fibromyalgia Syndrome: Continue Topamax and Continue HEP. 11/07/2017  20 minutes of face to face patient care time was spent during this visit. All questions were encouraged and answered.  F/U in 1 month

## 2017-11-16 IMAGING — MR MR LUMBAR SPINE WO/W CM
7 series · 35 of 48 positions shown · IV contrast (15 ML MAGNEVIST)
Comparison: Radiographs 12/02/2016.  Lumbar MRI 06/29/2013.

CLINICAL DATA: Low back pain radiating into the left hip. History
of back surgery. Multiple falls in the last 6-9 months.

EXAM:
MRI LUMBAR SPINE WITHOUT AND WITH CONTRAST
TECHNIQUE: Multiplanar and multiecho pulse sequences of the lumbar spine were
obtained without and with intravenous contrast.
CONTRAST:  15mL MULTIHANCE GADOBENATE DIMEGLUMINE 529 MG/ML IV SOLN

[Series 2: T2 · sagittal · 4.0mm · 0.81mm/px · 5 of 15 slices shown (1 of 2)]
[im 1/15]
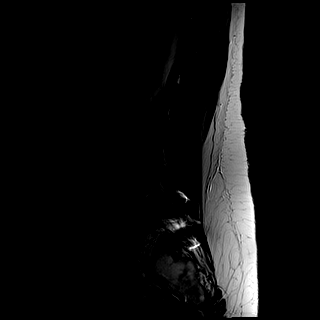
[im 4/15]
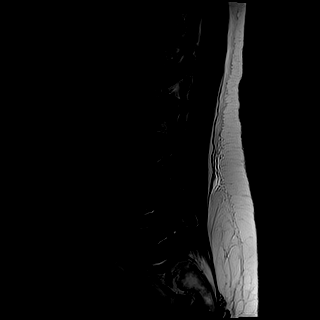
[im 8/15]
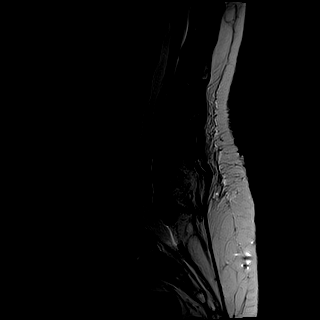
[im 11/15]
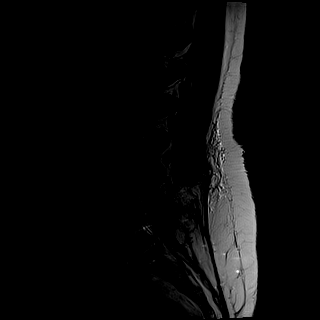
[im 15/15]
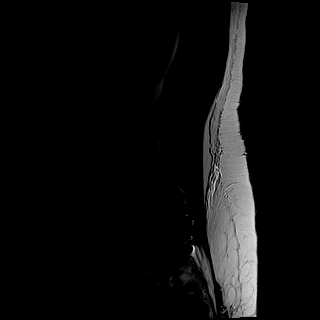

[Series 3: T1 · sagittal · 4.0mm · 0.81mm/px · 5 of 15 slices shown (1 of 2)]
[im 1/15]
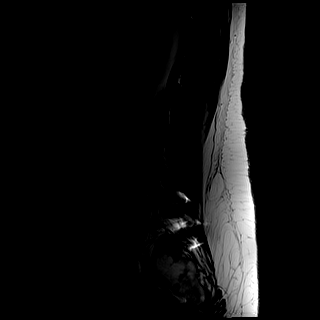
[im 4/15]
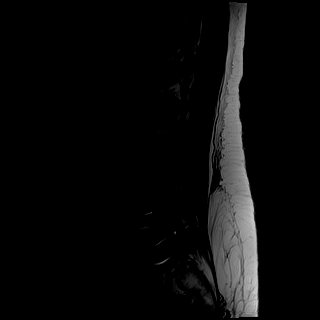
[im 8/15]
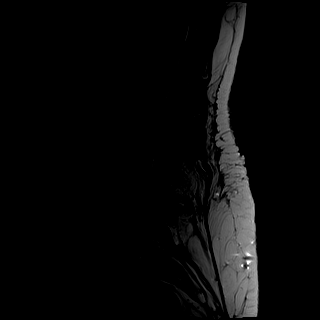
[im 11/15]
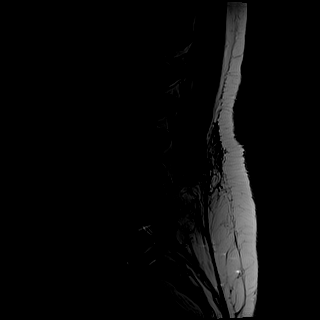
[im 15/15]
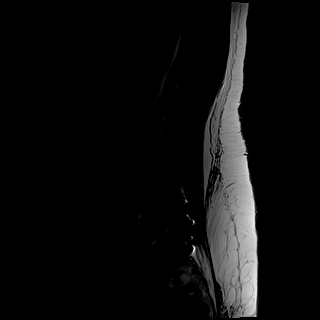

[Series 4: STIR · sagittal · 4.0mm · 1.02mm/px · 4 of 15 slices shown]
[im 1/15]
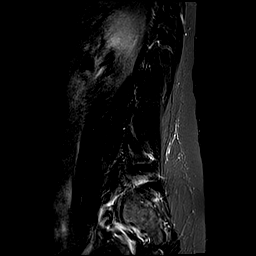
[im 5/15]
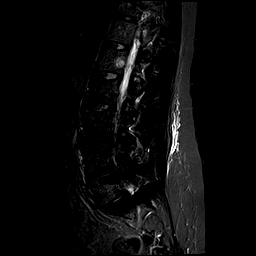
[im 10/15]
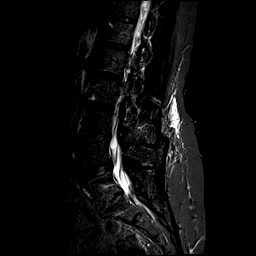
[im 15/15]
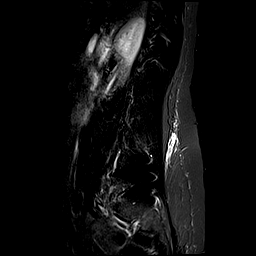

[Series 5: T2 · axial · 4.0mm · 0.78mm/px · z∈[-122,+87]mm · 8 of 38 slices shown (2 of 2)]
[im 1/38]
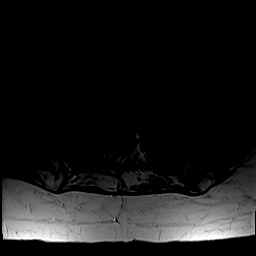
[im 5/38]
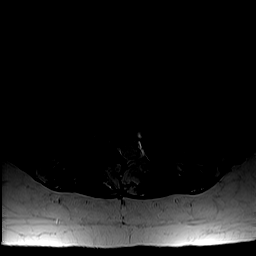
[im 13/38]
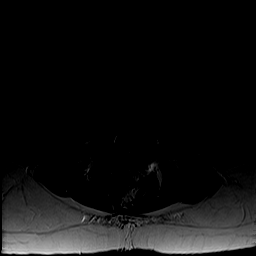
[im 17/38]
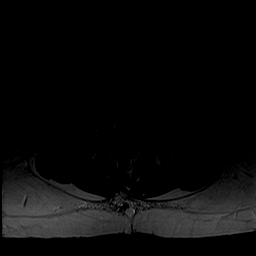
[im 21/38]
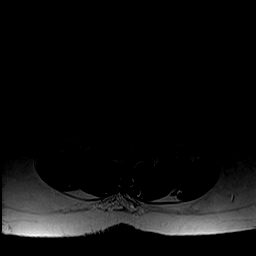
[im 25/38]
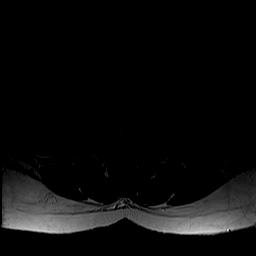
[im 33/38]
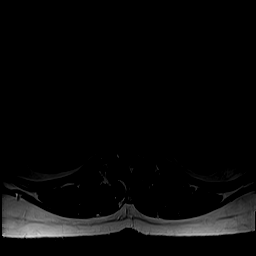
[im 38/38]
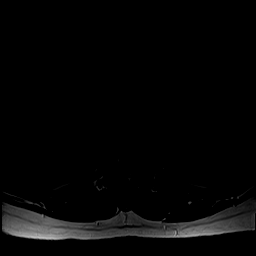

[Series 6: T1 · axial · 4.0mm · 0.39mm/px · z∈[-122,+87]mm · 8 of 38 slices shown (2 of 2)]
[im 1/38]
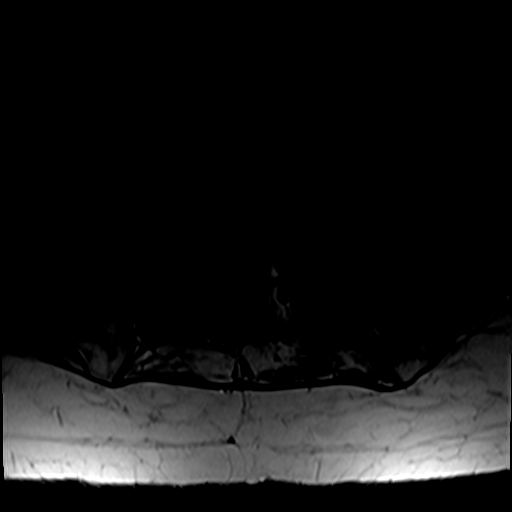
[im 5/38]
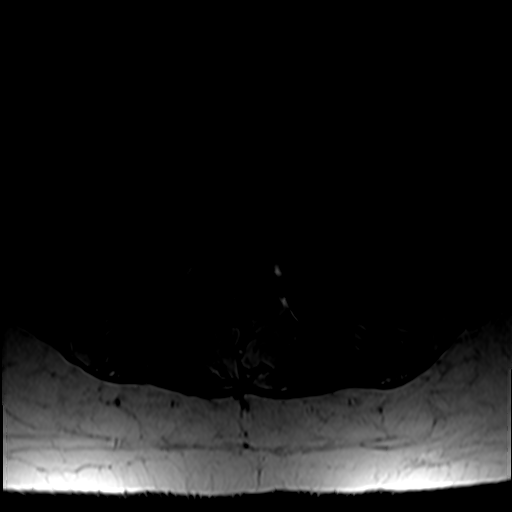
[im 13/38]
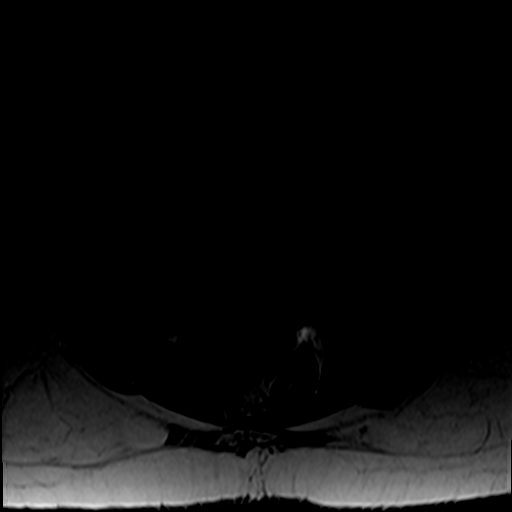
[im 17/38]
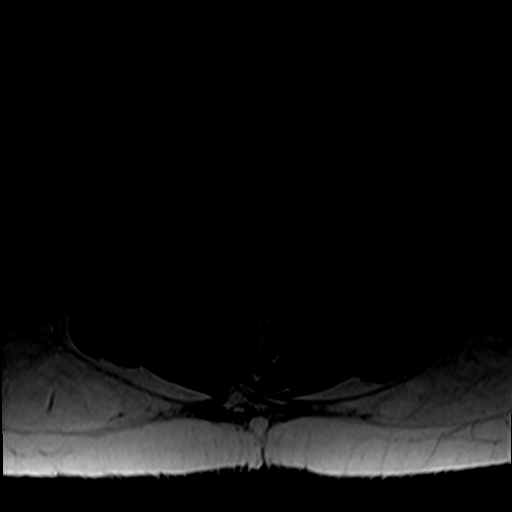
[im 21/38]
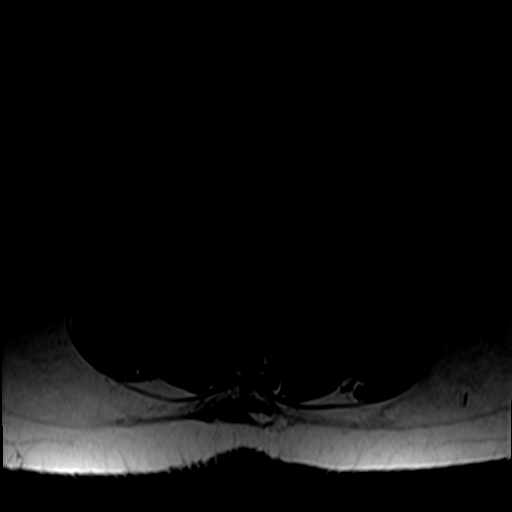
[im 25/38]
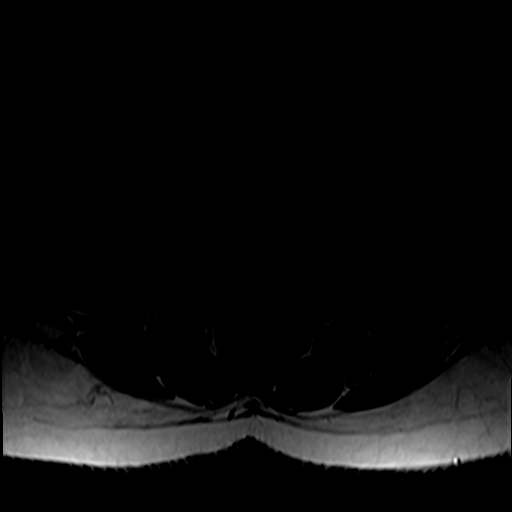
[im 33/38]
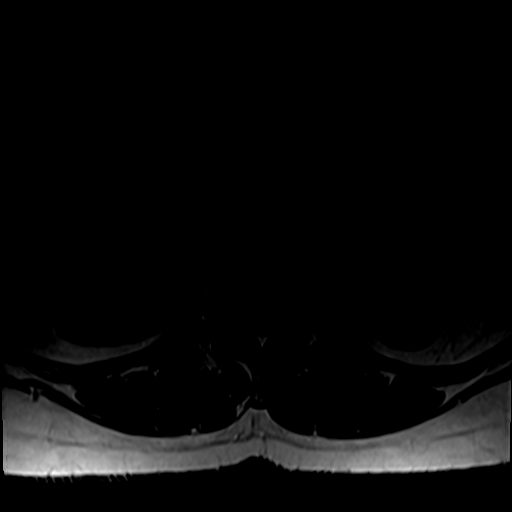
[im 38/38]
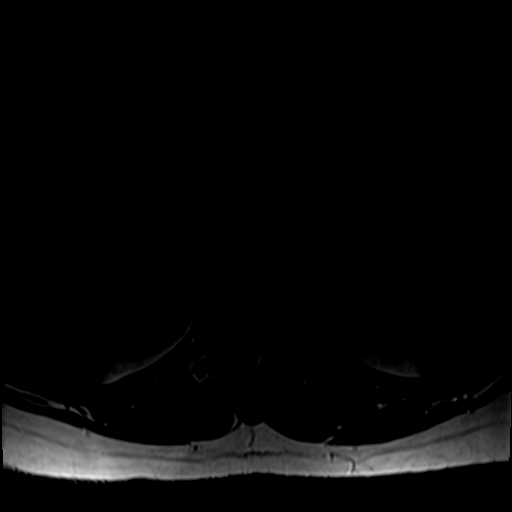

[Series 7: T1 fat-sat post-contrast · sagittal · 4.0mm · 0.81mm/px · 4 of 15 slices shown]
[im 1/15]
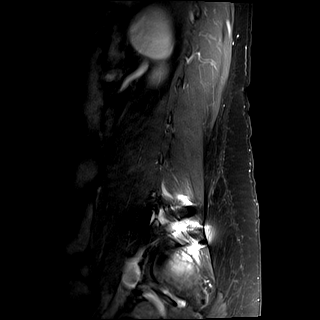
[im 5/15]
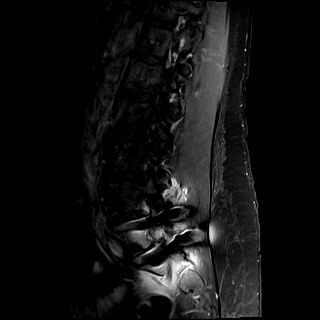
[im 10/15]
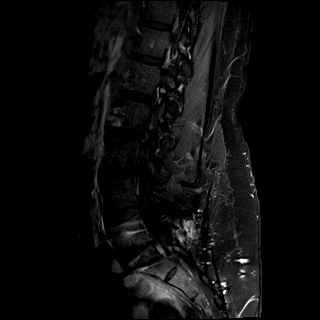
[im 15/15]
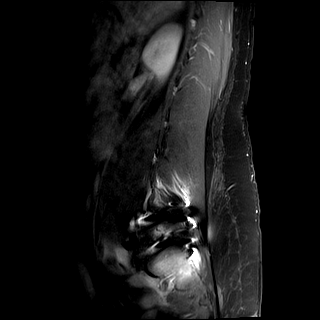

[Series 8: T1 post-contrast · axial · 4.0mm · 0.39mm/px · 1 of 38 slices shown]
[im 1/38]
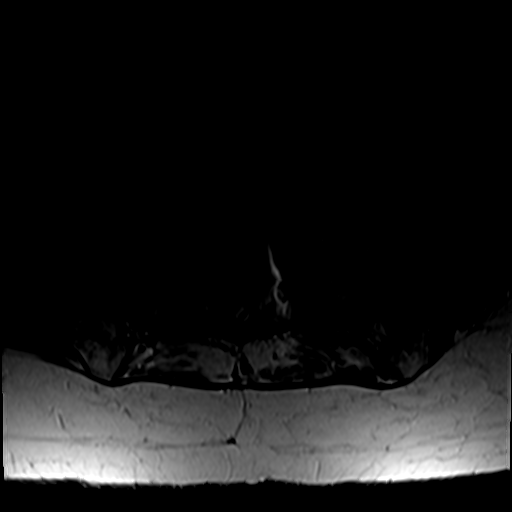

[35 of 48 positions shown; findings below may reference images not displayed]

FINDINGS: Segmentation: Conventional anatomy assumed, with the last open disc
space designated L5-S1.

Alignment: Minimal convex left scoliosis. The lateral alignment is
normal.

Vertebrae: No worrisome osseous lesion, acute fracture or pars
defect. There are multiple scattered hemangiomas, largest at L1.
These appear stable. There are chronic endplate degenerative changes
at L5-S1 status post laminectomy and PLIF. The visualized sacroiliac
joints appear unremarkable.

Conus medullaris: Extends to the upper L1 level and appears normal.
No abnormal intradural enhancement.

Paraspinal and other soft tissues: No significant paraspinal
findings.

Disc levels:

No significant disc space findings from T11-12 through L2-3.

L3-4: Minor disc bulging and mild facet hypertrophy. No disc
herniation, spinal stenosis or nerve root encroachment.

L4-5: Mild disc bulging, facet and ligamentous hypertrophy. No
spinal stenosis or nerve root encroachment.

L5-S1: Status post left laminectomy and PLIF. Interbody fusion
appears solid. The spinal canal is decompressed. No evidence of
nerve root encroachment.
IMPRESSION: 1. No acute findings or significant changes compared with previous
study from 6976.
2. Stable postsurgical changes at L5-S1.
3. Mild disc degeneration and facet hypertrophy at L3-4 and L4-5
without resulting spinal stenosis or nerve root encroachment.

## 2017-11-23 ENCOUNTER — Other Ambulatory Visit: Payer: Self-pay

## 2017-11-23 ENCOUNTER — Encounter (HOSPITAL_COMMUNITY): Payer: Self-pay | Admitting: Emergency Medicine

## 2017-11-23 ENCOUNTER — Ambulatory Visit (HOSPITAL_COMMUNITY)
Admission: EM | Admit: 2017-11-23 | Discharge: 2017-11-23 | Disposition: A | Discharge status: Home / Self Care | Payer: Medicare Other | Attending: Urgent Care | Admitting: Urgent Care

## 2017-11-23 DIAGNOSIS — S30861A Insect bite (nonvenomous) of abdominal wall, initial encounter: Secondary | ICD-10-CM | POA: Diagnosis not present

## 2017-11-23 DIAGNOSIS — L03314 Cellulitis of groin: Secondary | ICD-10-CM

## 2017-11-23 DIAGNOSIS — W57XXXA Bitten or stung by nonvenomous insect and other nonvenomous arthropods, initial encounter: Secondary | ICD-10-CM

## 2017-11-23 DIAGNOSIS — Z23 Encounter for immunization: Secondary | ICD-10-CM | POA: Diagnosis not present

## 2017-11-23 MED ORDER — TETANUS-DIPHTH-ACELL PERTUSSIS 5-2.5-18.5 LF-MCG/0.5 IM SUSP
0.5000 mL | Freq: Once | INTRAMUSCULAR | Status: AC
  Administered 2017-11-23: 0.5 mL via INTRAMUSCULAR

## 2017-11-23 MED ORDER — CEPHALEXIN 500 MG PO CAPS: 500.0000 mg | ORAL_CAPSULE | Freq: Three times a day (TID) | ORAL | 0 refills | Status: DC

## 2017-11-23 MED ORDER — TETANUS-DIPHTHERIA TOXOIDS TD 5-2 LFU IM INJ: 0.5000 mL | INJECTION | Freq: Once | INTRAMUSCULAR | Status: DC

## 2017-11-23 MED ORDER — TETANUS-DIPHTH-ACELL PERTUSSIS 5-2.5-18.5 LF-MCG/0.5 IM SUSP
INTRAMUSCULAR | Status: AC
  Filled 2017-11-23: qty 0.5

## 2017-11-23 NOTE — ED Provider Notes (Signed)
MRN: [id number] DOB: November 14, 1965  Subjective:   Cindy Pratt is a 52 y.o. female presenting for itchy, mildly tender rash over left groin area following removal of what she believes was a lone star tick. Denies fever, headache, sore throat, cough, n/v, belly pain (outside of what is normal for her given her chronic pain), rash over hands/limbs. Denies smoking cigarettes.  No current facility-administered medications for this encounter.   Current Outpatient Medications:  .  B Complex-Folic Acid (SUPER B COMPLEX MAXI PO), Take 1 tablet by mouth daily with supper. , Disp: , Rfl:  .  buPROPion (WELLBUTRIN SR) 150 MG 12 hr tablet, Take 150 mg by mouth 2 (two) times daily., Disp: , Rfl:  .  cetirizine (ZYRTEC) 10 MG tablet, Take 10 mg by mouth daily as needed for allergies. , Disp: , Rfl:  .  FLUoxetine HCl 60 MG TABS, Take 60 mg by mouth daily., Disp: , Rfl: 5 .  lurasidone (LATUDA) 40 MG TABS tablet, Take 40 mg by mouth at bedtime. , Disp: , Rfl:  .  morphine (MS CONTIN) 30 MG 12 hr tablet, Take 1 tablet (30 mg total) by mouth every 8 (eight) hours., Disp: 90 tablet, Rfl: 0 .  Multiple Vitamin (MULTIVITAMIN WITH MINERALS) TABS tablet, Take 1 tablet by mouth daily with supper. , Disp: , Rfl:  .  naloxegol oxalate (MOVANTIK) 25 MG TABS tablet, Take 25 mg by mouth daily., Disp: , Rfl:  .  tiZANidine (ZANAFLEX) 4 MG tablet, Take 1 tablet (4 mg total) by mouth 2 (two) times daily., Disp: 60 tablet, Rfl: 5 .  topiramate (TOPAMAX) 25 MG tablet, Take 1 tablet (25 mg total) by mouth daily., Disp: 30 tablet, Rfl: 2 .  traMADol (ULTRAM) 50 MG tablet, Take 1 tablet (50 mg total) by mouth 2 (two) times daily as needed., Disp: 60 tablet, Rfl: 2   Allergies  Allergen Reactions  . Aspirin Shortness Of Breath  . Nsaids Shortness Of Breath  . Sulfa Antibiotics Hives  . Sulfonamide Derivatives Hives  . Tolmetin Shortness Of Breath  . Cymbalta [Duloxetine Hcl] Other (See Comments)    confusion  . Duloxetine  Other (See Comments)    confusion  . Gabapentin Other (See Comments)    Causes confusion  . Other Other (See Comments)    Aswanghda? Patient is not sure of spelling it is an herb- caused confusion  . Pregabalin Other (See Comments)    confusion    Past Medical History:  Diagnosis Date  . Anxiety   . Asthma   . Belchings   . Chronic pain syndrome   . Degenerative joint disease   . Depression   . Disturbance of skin sensation   . Fibromyalgia   . GERD (gastroesophageal reflux disease)   . Hyperlipidemia   . Lumbar post-laminectomy syndrome   . Lumbosacral neuritis   . Osteoarthritis   . Other chronic postoperative pain   . Sciatica      Past Surgical History:  Procedure Laterality Date  . ABDOMINAL HYSTERECTOMY    . BACK SURGERY  1997, 2003  . CYSTECTOMY    . DIAGNOSTIC LAPAROSCOPY    . WRIST ARTHROPLASTY Left   . WRIST ARTHROSCOPY WITH DEBRIDEMENT Right 11/13/2015   Procedure: RIGHT WRIST ARTHROSCOPY WITH DEBRIDEMENT;  Surgeon: Betha Loa, MD;  Location: [facility];  Service: Orthopedics;  Laterality: Right;    Objective:   Vitals: BP 120/64 (BP Location: Left Arm)   Pulse (!) 57  Temp 97.9 F (36.6 C) (Oral)   Resp 18   SpO2 99%   Physical Exam  Constitutional: She is oriented to person, place, and time. She appears well-developed and well-nourished.  Cardiovascular: Normal rate.  Pulmonary/Chest: Effort normal.  Neurological: She is alert and oriented to person, place, and time.  Skin:      Assessment and Plan :   Cellulitis of groin  Tick bite of groin, initial encounter  Need for Td vaccine  Will cover for cellulitis with Keflex. Counseled patient on signs of tick borne illness. Will have patient maintain her Zyrtec for contact dermatitis. Tdap updated today. Return-to-clinic precautions discussed, patient verbalized understanding.    Wallis BambergMani, Tatijana Bierly, PA-C 11/24/17 1210

## 2017-11-23 NOTE — ED Triage Notes (Signed)
Patient removed tick from left groin area yesterday.  Patient noticed rash today.

## 2017-11-23 NOTE — Discharge Instructions (Signed)
Please monitor your symptoms for fever, headache, cough, swollen lymph nodes, different joint pains. If these develop, then return to clinic for a recheck. Otherwise, we will manage your symptoms for uncomplicated cellulitis.

## 2017-11-24 ENCOUNTER — Encounter (HOSPITAL_COMMUNITY): Payer: Self-pay | Admitting: Urgent Care

## 2017-12-05 ENCOUNTER — Ambulatory Visit: Payer: Medicare Other | Admitting: Physical Medicine & Rehabilitation

## 2017-12-05 DIAGNOSIS — Z87891 Personal history of nicotine dependence: Secondary | ICD-10-CM | POA: Insufficient documentation

## 2017-12-05 DIAGNOSIS — E785 Hyperlipidemia, unspecified: Secondary | ICD-10-CM | POA: Insufficient documentation

## 2017-12-05 DIAGNOSIS — Z981 Arthrodesis status: Secondary | ICD-10-CM | POA: Insufficient documentation

## 2017-12-05 DIAGNOSIS — M797 Fibromyalgia: Secondary | ICD-10-CM | POA: Insufficient documentation

## 2017-12-05 DIAGNOSIS — J45909 Unspecified asthma, uncomplicated: Secondary | ICD-10-CM | POA: Insufficient documentation

## 2017-12-05 DIAGNOSIS — M1990 Unspecified osteoarthritis, unspecified site: Secondary | ICD-10-CM | POA: Insufficient documentation

## 2017-12-05 DIAGNOSIS — M961 Postlaminectomy syndrome, not elsewhere classified: Secondary | ICD-10-CM | POA: Insufficient documentation

## 2017-12-05 DIAGNOSIS — G629 Polyneuropathy, unspecified: Secondary | ICD-10-CM | POA: Insufficient documentation

## 2017-12-05 DIAGNOSIS — F329 Major depressive disorder, single episode, unspecified: Secondary | ICD-10-CM | POA: Insufficient documentation

## 2017-12-12 ENCOUNTER — Ambulatory Visit: Payer: Medicare Other | Admitting: Physical Medicine & Rehabilitation

## 2017-12-14 ENCOUNTER — Encounter: Payer: Self-pay | Admitting: Registered Nurse

## 2017-12-14 ENCOUNTER — Encounter: Payer: Medicare Other | Attending: Registered Nurse | Admitting: Registered Nurse

## 2017-12-14 VITALS — BP 119/81 | HR 62 | Ht 64.0 in | Wt 156.0 lb

## 2017-12-14 DIAGNOSIS — Z5181 Encounter for therapeutic drug level monitoring: Secondary | ICD-10-CM | POA: Diagnosis not present

## 2017-12-14 DIAGNOSIS — M62838 Other muscle spasm: Secondary | ICD-10-CM | POA: Diagnosis not present

## 2017-12-14 DIAGNOSIS — Z981 Arthrodesis status: Secondary | ICD-10-CM | POA: Diagnosis not present

## 2017-12-14 DIAGNOSIS — M797 Fibromyalgia: Secondary | ICD-10-CM | POA: Diagnosis not present

## 2017-12-14 DIAGNOSIS — M542 Cervicalgia: Secondary | ICD-10-CM | POA: Diagnosis not present

## 2017-12-14 DIAGNOSIS — M961 Postlaminectomy syndrome, not elsewhere classified: Secondary | ICD-10-CM

## 2017-12-14 DIAGNOSIS — Z79899 Other long term (current) drug therapy: Secondary | ICD-10-CM

## 2017-12-14 DIAGNOSIS — M1990 Unspecified osteoarthritis, unspecified site: Secondary | ICD-10-CM | POA: Diagnosis not present

## 2017-12-14 DIAGNOSIS — M5412 Radiculopathy, cervical region: Secondary | ICD-10-CM

## 2017-12-14 DIAGNOSIS — J45909 Unspecified asthma, uncomplicated: Secondary | ICD-10-CM | POA: Diagnosis not present

## 2017-12-14 DIAGNOSIS — G629 Polyneuropathy, unspecified: Secondary | ICD-10-CM | POA: Diagnosis not present

## 2017-12-14 DIAGNOSIS — E785 Hyperlipidemia, unspecified: Secondary | ICD-10-CM | POA: Diagnosis not present

## 2017-12-14 DIAGNOSIS — F329 Major depressive disorder, single episode, unspecified: Secondary | ICD-10-CM | POA: Diagnosis not present

## 2017-12-14 DIAGNOSIS — M7061 Trochanteric bursitis, right hip: Secondary | ICD-10-CM

## 2017-12-14 DIAGNOSIS — M7062 Trochanteric bursitis, left hip: Secondary | ICD-10-CM

## 2017-12-14 DIAGNOSIS — Z87891 Personal history of nicotine dependence: Secondary | ICD-10-CM | POA: Diagnosis not present

## 2017-12-14 DIAGNOSIS — G894 Chronic pain syndrome: Secondary | ICD-10-CM

## 2017-12-14 DIAGNOSIS — G8929 Other chronic pain: Secondary | ICD-10-CM | POA: Diagnosis present

## 2017-12-14 MED ORDER — MORPHINE SULFATE ER 30 MG PO TBCR: 30.0000 mg | EXTENDED_RELEASE_TABLET | Freq: Three times a day (TID) | ORAL | 0 refills | Status: DC

## 2017-12-14 NOTE — Progress Notes (Signed)
Subjective:    Patient ID: Cindy Pratt, female    DOB: 1966-05-13, 52 y.o.   MRN: [id number]  HPI: [name] is a 52 year old female who returns for follow up appointment for chronic pain and medication refill. She states her pain is located in her neck radiating into her left shoulder, left arm into her left elbow, lower back and bilateral hips. She rates her pain 8. Her current exercise regime is walking.   [name] Morphine Equivalent is 100.00 MME.   Last Oral Swab was Performed on 09/19/2017, it was consistent.   Pain Inventory Average Pain 8 Pain Right Now 8 My pain is .  In the last 24 hours, has pain interfered with the following? General activity 8 Relation with others 8 Enjoyment of life 8 What TIME of day is your pain at its worst? all Sleep (in general) Fair  Pain is worse with: . Pain improves with: medication Relief from Meds: 5  Mobility walk without assistance  Function Do you have any goals in this area?  no  Neuro/Psych No problems in this area  Prior Studies Any changes since last visit?  no  Physicians involved in your care Any changes since last visit?  no   Family History  Problem Relation Age of Onset  . Hypertension Mother   . Hypertension Father   . Heart attack Father   . Breast cancer Maternal Grandmother   . Liver cancer Maternal Grandmother   . Cancer Maternal Grandmother        breast cancer  . Ovarian cancer Cousin   . Cancer Cousin        1st c. maternal side/ cervical cancer  . Cancer Cousin        1st c maternal/ colon cancer  . Diabetes Maternal Grandfather        both sides   Social History   Socioeconomic History  . Marital status: Married    Spouse name: Cindy Pratt  . Number of children: 4  . Years of education: M  . Highest education level: Not on file  Occupational History  . Occupation: disabled    Employer: DISABLE  Social Needs  . Financial resource strain: Not on file  . Food insecurity:      Worry: Not on file    Inability: Not on file  . Transportation needs:    Medical: Not on file    Non-medical: Not on file  Tobacco Use  . Smoking status: Former Smoker    Packs/day: 1.00    Years: 20.00    Pack years: 20.00    Types: Cigarettes    Last attempt to quit: 07/31/2007    Years since quitting: 10.3  . Smokeless tobacco: Never Used  Substance and Sexual Activity  . Alcohol use: No  . Drug use: No  . Sexual activity: Yes    Birth control/protection: Surgical  Lifestyle  . Physical activity:    Days per week: Not on file    Minutes per session: Not on file  . Stress: Not on file  Relationships  . Social connections:    Talks on phone: Not on file    Gets together: Not on file    Attends religious service: Not on file    Active member of club or organization: Not on file    Attends meetings of clubs or organizations: Not on file    Relationship status: Not on file  Other Topics Concern  .  Not on file  Social History Narrative   Lives at home with husband Cindy ShellHubert   Drinks 4 sodas a day   Past Surgical History:  Procedure Laterality Date  . ABDOMINAL HYSTERECTOMY    . BACK SURGERY  1997, 2003  . CYSTECTOMY    . DIAGNOSTIC LAPAROSCOPY    . WRIST ARTHROPLASTY Left   . WRIST ARTHROSCOPY WITH DEBRIDEMENT Right 11/13/2015   Procedure: RIGHT WRIST ARTHROSCOPY WITH DEBRIDEMENT;  Surgeon: Betha LoaKevin Kuzma, MD;  Location: [facility];  Service: Orthopedics;  Laterality: Right;   Past Medical History:  Diagnosis Date  . Anxiety   . Asthma   . Belchings   . Chronic pain syndrome   . Degenerative joint disease   . Depression   . Disturbance of skin sensation   . Fibromyalgia   . GERD (gastroesophageal reflux disease)   . Hyperlipidemia   . Lumbar post-laminectomy syndrome   . Lumbosacral neuritis   . Osteoarthritis   . Other chronic postoperative pain   . Sciatica    There were no vitals taken for this visit.  Opioid Risk Score:   Fall Risk  Score:  `1  Depression screen PHQ 2/9  Dep[facility] screen [facility] 2/9 11/07/2017 10/10/2017 07/18/2017 05/23/2017 02/16/2016 10/20/2015 05/22/2015  Decreased Interest 3 3 0 3 0 1 1  Down, Depressed, Hopeless 3 3 0 3 0 1 1  PHQ - 2 Score 6 6 0 6 0 2 2  Altered sleeping - - - - - - -  Tired, decreased energy - - - - - - -  Change in appetite - - - - - - -  Feeling bad or failure about yourself  - - - - - - -  Trouble concentrating - - - - - - -  Moving slowly or fidgety/restless - - - - - - -  Suicidal thoughts - - - - - - -  PHQ-9 Score - - - - - - -  Some recent data might be hidden     Review of Systems  Constitutional: Negative.   HENT: Negative.   Eyes: Negative.   Respiratory: Negative.   Cardiovascular: Negative.   Gastrointestinal: Negative.   Endocrine: Negative.   Genitourinary: Negative.   Musculoskeletal: Positive for arthralgias and myalgias.  Skin: Negative.   Allergic/Immunologic: Negative.   Neurological: Negative.   Hematological: Negative.   Psychiatric/Behavioral: Negative.   All other systems reviewed and are negative.      Objective:   Physical Exam  Constitutional: She is oriented to person, place, and time. She appears well-developed and well-nourished.  HENT:  Head: Normocephalic and atraumatic.  Neck: Normal range of motion. Neck supple.  Cervical Paraspinal Tenderness: C-5-C-6  Cardiovascular: Normal rate and regular rhythm.  Pulmonary/Chest: Effort normal and breath sounds normal.  Musculoskeletal:  Normal Muscle Bulk and Muscle Testing Reveals:  Upper Extremities: Full ROM and Muscle Strength 5/5 Left AC Joint Tenderness Thoracic Paraspinal Tenderness: T-1-T-3 Mainly Left Side Lumbar Paraspinal Tenderness: L-3-L-5 Lower Extremities: Full ROM and Muscle Strength 5/5 Arises from Table with Ease Narrow Based gait  Neurological: She is alert and oriented to person, place, and time.  Skin: Skin is warm and dry.  Psychiatric: She has a normal mood  and affect.  Nursing note and vitals reviewed.         Assessment & Plan:  1.Lumbar postlaminectomy syndrome status post L5-S1 fusion radiating to LLE: Left Lumbar Radiculitis: Continue current medication regimen with Topamax.  12/14/2017 Refilled: MS  contin 30 mg # 90 pills--use one pill every 8 hours for pain and ContinueTramadol 50 mg BID. #60. We will continue the opioid monitoring program, this consists of regular clinic visits, examinations, urine drug screen, pill counts as well as use of [place] Controlled Substance reporting System. 2. Depression: Continue Current Medication Regimen of Prozac, Latuda and Wellbutrin. PCP following. 12/14/2017 3. Muscle Spasm: Continue current medication regimen with Tizanidine. 12/14/2017 4. Sacroiliac Joint Dysfunction: Continue to monitor.12/14/2017 5. Opioid Induces Constipation: Continue  To Monitor 12/14/2017 6. Cervicalgia/  Cervical Radiculitis: Dr. Yevette Edwards Following: Continue current medication regimen with Topamax. Continue to Monitor: Allergic: Gabapentin, Lyrica and Cymbalta. 12/14/2017 7. Fibromyalgia Syndrome: Continue Topamax and Continue HEP. 12/14/2017  20 minutes of face to face patient care time was spent during this visit. All questions were encouraged and answered.  F/U in 1 month

## 2018-01-06 ENCOUNTER — Encounter: Payer: Self-pay | Admitting: Physical Medicine & Rehabilitation

## 2018-01-06 ENCOUNTER — Ambulatory Visit (HOSPITAL_BASED_OUTPATIENT_CLINIC_OR_DEPARTMENT_OTHER): Payer: Medicare Other | Admitting: Physical Medicine & Rehabilitation

## 2018-01-06 ENCOUNTER — Other Ambulatory Visit: Payer: Self-pay

## 2018-01-06 ENCOUNTER — Encounter: Payer: Medicare Other | Attending: Registered Nurse

## 2018-01-06 VITALS — BP 110/74 | HR 66 | Ht 64.0 in | Wt 155.0 lb

## 2018-01-06 DIAGNOSIS — M961 Postlaminectomy syndrome, not elsewhere classified: Secondary | ICD-10-CM | POA: Diagnosis not present

## 2018-01-06 DIAGNOSIS — M1990 Unspecified osteoarthritis, unspecified site: Secondary | ICD-10-CM | POA: Insufficient documentation

## 2018-01-06 DIAGNOSIS — F329 Major depressive disorder, single episode, unspecified: Secondary | ICD-10-CM | POA: Diagnosis not present

## 2018-01-06 DIAGNOSIS — M533 Sacrococcygeal disorders, not elsewhere classified: Secondary | ICD-10-CM

## 2018-01-06 DIAGNOSIS — J45909 Unspecified asthma, uncomplicated: Secondary | ICD-10-CM | POA: Insufficient documentation

## 2018-01-06 DIAGNOSIS — G629 Polyneuropathy, unspecified: Secondary | ICD-10-CM | POA: Diagnosis not present

## 2018-01-06 DIAGNOSIS — M5416 Radiculopathy, lumbar region: Secondary | ICD-10-CM

## 2018-01-06 DIAGNOSIS — Z981 Arthrodesis status: Secondary | ICD-10-CM | POA: Diagnosis not present

## 2018-01-06 DIAGNOSIS — E785 Hyperlipidemia, unspecified: Secondary | ICD-10-CM | POA: Diagnosis not present

## 2018-01-06 DIAGNOSIS — M542 Cervicalgia: Secondary | ICD-10-CM

## 2018-01-06 DIAGNOSIS — Z87891 Personal history of nicotine dependence: Secondary | ICD-10-CM | POA: Insufficient documentation

## 2018-01-06 DIAGNOSIS — M797 Fibromyalgia: Secondary | ICD-10-CM | POA: Diagnosis not present

## 2018-01-06 DIAGNOSIS — G8929 Other chronic pain: Secondary | ICD-10-CM | POA: Diagnosis present

## 2018-01-06 MED ORDER — MORPHINE SULFATE ER 30 MG PO TBCR: 30.0000 mg | EXTENDED_RELEASE_TABLET | Freq: Three times a day (TID) | ORAL | 0 refills | Status: DC

## 2018-01-06 NOTE — Progress Notes (Signed)
Subjective:    Patient ID: Cindy Pratt, female    DOB: Apr 25, 1966, 52 y.o.   MRN: [id number]  HPI   52 yo female with remote hx of L5-S1 Lumbar fusion (2003) with chronic complaints of low back pain and Left foot "neuropathy" pain Pt is unable to tolerate gabapentin, Cymbalta or Lyrica due to confusion She dose tolerate topiramate  No issues medically since last visit except for a tick bite in the left groin area treated with antibiotic and local care.  No adverse side effects from medication  Pain Inventory Average Pain 9 Pain Right Now 8 My pain is all  In the last 24 hours, has pain interfered with the following? General activity 10 Relation with others 10 Enjoyment of life 10 What TIME of day is your pain at its worst? all Sleep (in general) NA  Pain is worse with: some activites Pain improves with: medication Relief from Meds: no ans  Mobility Do you have any goals in this area?  no  Function Do you have any goals in this area?  no  Neuro/Psych weakness dizziness confusion depression anxiety  Prior Studies Any changes since last visit?  no  Physicians involved in your care Any changes since last visit?  no   Family History  Problem Relation Age of Onset  . Hypertension Mother   . Hypertension Father   . Heart attack Father   . Breast cancer Maternal Grandmother   . Liver cancer Maternal Grandmother   . Cancer Maternal Grandmother        breast cancer  . Ovarian cancer Cousin   . Cancer Cousin        1st c. maternal side/ cervical cancer  . Cancer Cousin        1st c maternal/ colon cancer  . Diabetes Maternal Grandfather        both sides   Social History   Socioeconomic History  . Marital status: Married    Spouse name: [name]  . Number of children: 4  . Years of education: M  . Highest education level: Not on file  Occupational History  . Occupation: disabled    Employer: DISABLE  Social Needs  . Financial resource strain: Not  on file  . Food insecurity:    Worry: Not on file    Inability: Not on file  . Transportation needs:    Medical: Not on file    Non-medical: Not on file  Tobacco Use  . Smoking status: Former Smoker    Packs/day: 1.00    Years: 20.00    Pack years: 20.00    Types: Cigarettes    Last attempt to quit: 07/31/2007    Years since quitting: 10.4  . Smokeless tobacco: Never Used  Substance and Sexual Activity  . Alcohol use: No  . Drug use: No  . Sexual activity: Yes    Birth control/protection: Surgical  Lifestyle  . Physical activity:    Days per week: Not on file    Minutes per session: Not on file  . Stress: Not on file  Relationships  . Social connections:    Talks on phone: Not on file    Gets together: Not on file    Attends religious service: Not on file    Active member of club or organization: Not on file    Attends meetings of clubs or organizations: Not on file    Relationship status: Not on file  Other Topics Concern  .  Not on file  Social History Narrative   Lives at home with husband [name]   Drinks 4 sodas a day   Past Surgical History:  Procedure Laterality Date  . ABDOMINAL HYSTERECTOMY    . BACK SURGERY  1997, 2003  . CYSTECTOMY    . DIAGNOSTIC LAPAROSCOPY    . WRIST ARTHROPLASTY Left   . WRIST ARTHROSCOPY WITH DEBRIDEMENT Right 11/13/2015   Procedure: RIGHT WRIST ARTHROSCOPY WITH DEBRIDEMENT;  Surgeon: Betha Loa, MD;  Location: [facility];  Service: Orthopedics;  Laterality: Right;   Past Medical History:  Diagnosis Date  . Anxiety   . Asthma   . Belchings   . Chronic pain syndrome   . Degenerative joint disease   . Depression   . Disturbance of skin sensation   . Fibromyalgia   . GERD (gastroesophageal reflux disease)   . Hyperlipidemia   . Lumbar post-laminectomy syndrome   . Lumbosacral neuritis   . Osteoarthritis   . Other chronic postoperative pain   . Sciatica    BP 110/74   Pulse 66   Ht  (1.626 m)   Wt 155  lb (70.3 kg)   SpO2 96%   BMI 26.61 kg/m   Opioid Risk Score:   Fall Risk Score:  `1  Depression screen PHQ 2/9  Depression screen [facility] 2/9 01/06/2018 11/07/2017 10/10/2017 07/18/2017 05/23/2017 02/16/2016 10/20/2015  Decreased Interest 0 3 0 1  Down, Depressed, Hopeless 0 3 0 1  PHQ - 2 Score 0 6 0 2  Altered sleeping - - - - - - -  Tired, decreased energy - - - - - - -  Change in appetite - - - - - - -  Feeling bad or failure about yourself  - - - - - - -  Trouble concentrating - - - - - - -  Moving slowly or fidgety/restless - - - - - - -  Suicidal thoughts - - - - - - -  PHQ-9 Score - - - - - - -  Some recent data might be hidden     Review of Systems  Constitutional: Negative.   HENT: Negative.   Eyes: Negative.   Respiratory: Negative.   Cardiovascular: Negative.   Gastrointestinal: Negative.   Endocrine: Negative.   Genitourinary: Negative.   Musculoskeletal: Negative.   Skin: Negative.   Allergic/Immunologic: Negative.   Neurological: Positive for dizziness and weakness.  Hematological: Negative.   Psychiatric/Behavioral: Positive for confusion and dysphoric mood. The patient is nervous/anxious.   All other systems reviewed and are negative.      Objective:   Physical Exam  Constitutional: She appears well-developed and well-nourished.  HENT:  Head: Normocephalic and atraumatic.  Eyes: Pupils are equal, round, and reactive to light. EOM are normal.  Neurological: A sensory deficit is present.  Left L5 distribution to pinprick  Skin: Skin is warm and dry.  Psychiatric: She has a normal mood and affect. Her behavior is normal. Judgment and thought content normal.          Assessment & Plan:  1.  Lumbar post laminectomy syndrome with chronic low back pain with Sacroiliac pain and muscle spasm Cont MS Contin  TID Tramadol  BID Tizanidine  BID  Oral swab 09/19/17 consistent  2.  Lumbar radiculopathy Left L5 cont topiramate   daily  3.  Cervicalgia - pt with f/u with Dr Yevette Edwards  NP visit next month  to review CSA at that time May need updated ORT as well

## 2018-01-06 NOTE — Patient Instructions (Signed)

## 2018-02-03 ENCOUNTER — Encounter: Payer: Self-pay | Admitting: Registered Nurse

## 2018-02-03 ENCOUNTER — Ambulatory Visit: Payer: Medicare Other | Admitting: Physical Medicine & Rehabilitation

## 2018-02-03 ENCOUNTER — Ambulatory Visit: Payer: Medicare Other

## 2018-02-03 ENCOUNTER — Encounter: Payer: Medicare Other | Attending: Registered Nurse | Admitting: Registered Nurse

## 2018-02-03 VITALS — BP 114/74 | HR 73 | Ht 64.0 in | Wt 154.0 lb

## 2018-02-03 DIAGNOSIS — G629 Polyneuropathy, unspecified: Secondary | ICD-10-CM | POA: Diagnosis not present

## 2018-02-03 DIAGNOSIS — M5416 Radiculopathy, lumbar region: Secondary | ICD-10-CM | POA: Diagnosis not present

## 2018-02-03 DIAGNOSIS — M961 Postlaminectomy syndrome, not elsewhere classified: Secondary | ICD-10-CM | POA: Insufficient documentation

## 2018-02-03 DIAGNOSIS — M5412 Radiculopathy, cervical region: Secondary | ICD-10-CM | POA: Diagnosis not present

## 2018-02-03 DIAGNOSIS — Z5181 Encounter for therapeutic drug level monitoring: Secondary | ICD-10-CM

## 2018-02-03 DIAGNOSIS — Z79899 Other long term (current) drug therapy: Secondary | ICD-10-CM | POA: Diagnosis not present

## 2018-02-03 DIAGNOSIS — G894 Chronic pain syndrome: Secondary | ICD-10-CM

## 2018-02-03 DIAGNOSIS — F329 Major depressive disorder, single episode, unspecified: Secondary | ICD-10-CM | POA: Diagnosis not present

## 2018-02-03 DIAGNOSIS — M1990 Unspecified osteoarthritis, unspecified site: Secondary | ICD-10-CM | POA: Diagnosis not present

## 2018-02-03 DIAGNOSIS — J45909 Unspecified asthma, uncomplicated: Secondary | ICD-10-CM | POA: Insufficient documentation

## 2018-02-03 DIAGNOSIS — M62838 Other muscle spasm: Secondary | ICD-10-CM

## 2018-02-03 DIAGNOSIS — Z87891 Personal history of nicotine dependence: Secondary | ICD-10-CM | POA: Diagnosis not present

## 2018-02-03 DIAGNOSIS — Z981 Arthrodesis status: Secondary | ICD-10-CM | POA: Diagnosis not present

## 2018-02-03 DIAGNOSIS — E785 Hyperlipidemia, unspecified: Secondary | ICD-10-CM | POA: Diagnosis not present

## 2018-02-03 DIAGNOSIS — M7062 Trochanteric bursitis, left hip: Secondary | ICD-10-CM

## 2018-02-03 DIAGNOSIS — M797 Fibromyalgia: Secondary | ICD-10-CM | POA: Insufficient documentation

## 2018-02-03 DIAGNOSIS — M542 Cervicalgia: Secondary | ICD-10-CM

## 2018-02-03 DIAGNOSIS — M7061 Trochanteric bursitis, right hip: Secondary | ICD-10-CM | POA: Diagnosis not present

## 2018-02-03 DIAGNOSIS — G8929 Other chronic pain: Secondary | ICD-10-CM | POA: Diagnosis present

## 2018-02-03 MED ORDER — MORPHINE SULFATE ER 30 MG PO TBCR: 30.0000 mg | EXTENDED_RELEASE_TABLET | Freq: Three times a day (TID) | ORAL | 0 refills | Status: DC

## 2018-02-03 MED ORDER — TRAMADOL HCL 50 MG PO TABS: 50.0000 mg | ORAL_TABLET | Freq: Two times a day (BID) | ORAL | 2 refills | Status: DC | PRN

## 2018-02-03 MED ORDER — TOPIRAMATE 25 MG PO TABS: 25.0000 mg | ORAL_TABLET | Freq: Every day | ORAL | 2 refills | Status: DC

## 2018-02-03 NOTE — Progress Notes (Signed)
Subjective:    Patient ID: Cindy Pratt, female    DOB: 1965-12-30, 52 y.o.   MRN: [id number]  HPI: Cindy Pratt is a 52 year old female who returns for follow up appointment for chronic pain and medication refill. She states her pain is located in her neck radiating into her bilateral shoulders, entire back radiating into her left lower extremity and bilateral hip pain. She rates her pain 9. Her current exercise regime is walking and performing stretching exercises.  Cindy Pratt Morphine Equivalent is 100.00 MME. Last Oral Swab was Performed on 09/19/2017, it was consistent.  Cindy Pratt will be leaving for [facility] on Sunday 02/05/2018 for her son's [employer] Graduation, early Partial fill ordered, spoke with Pharmacist at [facility]. Cindy Pratt verbalizes understanding.   Pain Inventory Average Pain 8 Pain Right Now 9 My pain is intermittent, constant, sharp, burning, dull, stabbing, tingling and aching  In the last 24 hours, has pain interfered with the following? General activity 8 Relation with others 9 Enjoyment of life 8 What TIME of day is your pain at its worst? all Sleep (in general) Poor  Pain is worse with:  Pain improves with: medication Relief from Meds:   Mobility ability to climb steps?  yes do you drive?  yes  Function Do you have any goals in this area?  no  Neuro/Psych No problems in this area  Prior Studies no  Physicians involved in your care no   Family History  Problem Relation Age of Onset  . Hypertension Mother   . Hypertension Father   . Heart attack Father   . Breast cancer Maternal Grandmother   . Liver cancer Maternal Grandmother   . Cancer Maternal Grandmother        breast cancer  . Ovarian cancer Cousin   . Cancer Cousin        1st c. maternal side/ cervical cancer  . Cancer Cousin        1st c maternal/ colon cancer  . Diabetes Maternal Grandfather        both sides   Social History   Socioeconomic  History  . Marital status: Married    Spouse name: [name]  . Number of children: 4  . Years of education: M  . Highest education level: Not on file  Occupational History  . Occupation: disabled    Employer: DISABLE  Social Needs  . Financial resource strain: Not on file  . Food insecurity:    Worry: Not on file    Inability: Not on file  . Transportation needs:    Medical: Not on file    Non-medical: Not on file  Tobacco Use  . Smoking status: Former Smoker    Packs/day: 1.00    Years: 20.00    Pack years: 20.00    Types: Cigarettes    Last attempt to quit: 07/31/2007    Years since quitting: 10.5  . Smokeless tobacco: Never Used  Substance and Sexual Activity  . Alcohol use: No  . Drug use: No  . Sexual activity: Yes    Birth control/protection: Surgical  Lifestyle  . Physical activity:    Days per week: Not on file    Minutes per session: Not on file  . Stress: Not on file  Relationships  . Social connections:    Talks on phone: Not on file    Gets together: Not on file    Attends religious service: Not on file  Active member of club or organization: Not on file    Attends meetings of clubs or organizations: Not on file    Relationship status: Not on file  Other Topics Concern  . Not on file  Social History Narrative   Lives at home with husband Velda ShellHubert   Drinks 4 sodas a day   Past Surgical History:  Procedure Laterality Date  . ABDOMINAL HYSTERECTOMY    . BACK SURGERY  1997, 2003  . CYSTECTOMY    . DIAGNOSTIC LAPAROSCOPY    . WRIST ARTHROPLASTY Left   . WRIST ARTHROSCOPY WITH DEBRIDEMENT Right 11/13/2015   Procedure: RIGHT WRIST ARTHROSCOPY WITH DEBRIDEMENT;  Surgeon: Betha LoaKevin Kuzma, MD;  Location: [facility];  Service: Orthopedics;  Laterality: Right;   Past Medical History:  Diagnosis Date  . Anxiety   . Asthma   . Belchings   . Chronic pain syndrome   . Degenerative joint disease   . Depression   . Disturbance of skin sensation     . Fibromyalgia   . GERD (gastroesophageal reflux disease)   . Hyperlipidemia   . Lumbar post-laminectomy syndrome   . Lumbosacral neuritis   . Osteoarthritis   . Other chronic postoperative pain   . Sciatica    Ht 5\' 4"  (1.626 m)   Wt 154 lb (69.9 kg)   BMI 26.43 kg/m   Opioid Risk Score:   Fall Risk Score:  `1  Depression screen PHQ 2/9  [facility] 02/03/2018 01/06/2018 11/07/2017 10/10/2017 07/18/2017 05/23/2017 02/16/2016  Decreased Interest 3 3 3 3  0 3 0  Down, Depressed, Hopeless 3 3 3 3  0 3 0  PHQ - 2 Score 6 6 6 6  0 6 0  Altered sleeping - - - - - - -  Tired, decreased energy - - - - - - -  Change in appetite - - - - - - -  Feeling bad or failure about yourself  - - - - - - -  Trouble concentrating - - - - - - -  Moving slowly or fidgety/restless - - - - - - -  Suicidal thoughts - - - - - - -  PHQ-9 Score - - - - - - -  Some recent data might be hidden     Review of Systems  Constitutional: Negative.   HENT: Negative.   Eyes: Negative.   Respiratory: Negative.   Cardiovascular: Negative.   Gastrointestinal: Negative.   Endocrine: Negative.   Genitourinary: Negative.   Musculoskeletal: Negative.   Skin: Negative.   Allergic/Immunologic: Negative.   Neurological: Negative.   Hematological: Negative.   Psychiatric/Behavioral: Negative.   All other systems reviewed and are negative.      Objective:   Physical Exam  Constitutional: She is oriented to person, place, and time. She appears well-developed and well-nourished.  HENT:  Head: Normocephalic and atraumatic.  Neck: Normal range of motion. Neck supple.  Cervical Paraspinal Tenderness: C-5-C-6  Cardiovascular: Normal rate and regular rhythm.  Pulmonary/Chest: Effort normal and breath sounds normal.  Musculoskeletal:  Normal Muscle Bulk and Muscle Testing Reveals:  Upper Extremities: Full ROM and Muscle Strength 5/5 Bilateral AC Joint Tenderness Thoracic and Lumbar  Hypersensitivity Lower Extremities: Full ROM and Muscle Strength 5/5 Arises from Table with ease Narrow Based Gait  Neurological: She is alert and oriented to person, place, and time.  Skin: Skin is warm and dry.  Psychiatric: She has a normal mood and affect.  Nursing note and vitals  reviewed.         Assessment & Plan:  1.Lumbar postlaminectomy syndrome status post L5-S1 fusion radiating to LLE: Left Lumbar Radiculitis: Continue current medication regimen with Topamax. 02/03/2018 Refilled: Partial Re-fill)MS contin 30 mg # 15 pills--use one pill every 8 hours for pain and ContinueTramadol 50 mg BID. #60. We will continue the opioid monitoring program, this consists of regular clinic visits, examinations, urine drug screen, pill counts as well as use of [place] Controlled Substance reporting System. 2. Depression: Continue Current Medication Regimen of Prozac, Latuda and Wellbutrin. PCP following. 02/03/2018 3. Muscle Spasm: Continue current medication regimen with Tizanidine. 02/03/2018 4. Sacroiliac Joint Dysfunction: Continue to monitor.02/03/2018 5. Opioid Induces Constipation: Continue To Monitor 02/03/2018 6. Cervicalgia/ Cervical Radiculitis: Dr. Yevette Edwards Following: Continue current medication regimen with Topamax. Continue to Monitor: Allergic: Gabapentin, Lyrica and Cymbalta. 02/03/2018 7. Fibromyalgia Syndrome: Continue Topamax and Continue HEP. 02/03/2018 8. Greater Trochanter Bursitis: Continue to Alternate with Ice and Heat Therapy. 02/03/2018.   20 minutes of face to face patient care time was spent during this visit. All questions were encouraged and answered.  F/U in 1 month

## 2018-02-06 ENCOUNTER — Ambulatory Visit: Payer: Medicare Other | Admitting: Physical Medicine & Rehabilitation

## 2018-02-06 ENCOUNTER — Ambulatory Visit: Payer: Medicare Other

## 2018-02-13 ENCOUNTER — Telehealth: Payer: Self-pay

## 2018-02-13 MED ORDER — MORPHINE SULFATE ER 30 MG PO TBCR: 30.0000 mg | EXTENDED_RELEASE_TABLET | Freq: Three times a day (TID) | ORAL | 0 refills | Status: DC

## 2018-02-13 NOTE — Telephone Encounter (Signed)
On June 7th, Cindy Pratt picked up a partial fill of MS Contin. Full prescription e-scribed today. She picked up a full prescription of Tramadol on June 7th. PMP Aware Web-site was reviewed.

## 2018-02-13 NOTE — Telephone Encounter (Signed)
Pt called stating she was told to call and remind Riley Lamunice to send in full prescription for pain.

## 2018-03-03 ENCOUNTER — Other Ambulatory Visit: Payer: Self-pay | Admitting: Internal Medicine

## 2018-03-03 DIAGNOSIS — Z1231 Encounter for screening mammogram for malignant neoplasm of breast: Secondary | ICD-10-CM

## 2018-03-07 ENCOUNTER — Ambulatory Visit
Admission: RE | Admit: 2018-03-07 | Discharge: 2018-03-07 | Disposition: A | Discharge status: Home / Self Care | Payer: Medicare Other | Source: Ambulatory Visit | Attending: Internal Medicine | Admitting: Internal Medicine

## 2018-03-07 DIAGNOSIS — Z1231 Encounter for screening mammogram for malignant neoplasm of breast: Secondary | ICD-10-CM

## 2018-03-13 ENCOUNTER — Encounter: Payer: Medicare Other | Admitting: Registered Nurse

## 2018-03-16 ENCOUNTER — Encounter: Payer: Medicare Other | Attending: Registered Nurse | Admitting: Registered Nurse

## 2018-03-16 ENCOUNTER — Encounter: Payer: Self-pay | Admitting: Registered Nurse

## 2018-03-16 VITALS — BP 110/76 | HR 74 | Ht 63.0 in | Wt 156.0 lb

## 2018-03-16 DIAGNOSIS — M5412 Radiculopathy, cervical region: Secondary | ICD-10-CM

## 2018-03-16 DIAGNOSIS — Z981 Arthrodesis status: Secondary | ICD-10-CM | POA: Insufficient documentation

## 2018-03-16 DIAGNOSIS — M1990 Unspecified osteoarthritis, unspecified site: Secondary | ICD-10-CM | POA: Diagnosis not present

## 2018-03-16 DIAGNOSIS — J45909 Unspecified asthma, uncomplicated: Secondary | ICD-10-CM | POA: Insufficient documentation

## 2018-03-16 DIAGNOSIS — M961 Postlaminectomy syndrome, not elsewhere classified: Secondary | ICD-10-CM | POA: Diagnosis not present

## 2018-03-16 DIAGNOSIS — F329 Major depressive disorder, single episode, unspecified: Secondary | ICD-10-CM | POA: Diagnosis not present

## 2018-03-16 DIAGNOSIS — Z5181 Encounter for therapeutic drug level monitoring: Secondary | ICD-10-CM | POA: Diagnosis not present

## 2018-03-16 DIAGNOSIS — Z87891 Personal history of nicotine dependence: Secondary | ICD-10-CM | POA: Insufficient documentation

## 2018-03-16 DIAGNOSIS — M62838 Other muscle spasm: Secondary | ICD-10-CM | POA: Diagnosis not present

## 2018-03-16 DIAGNOSIS — G8929 Other chronic pain: Secondary | ICD-10-CM | POA: Diagnosis present

## 2018-03-16 DIAGNOSIS — M7062 Trochanteric bursitis, left hip: Secondary | ICD-10-CM

## 2018-03-16 DIAGNOSIS — E785 Hyperlipidemia, unspecified: Secondary | ICD-10-CM | POA: Insufficient documentation

## 2018-03-16 DIAGNOSIS — G629 Polyneuropathy, unspecified: Secondary | ICD-10-CM | POA: Insufficient documentation

## 2018-03-16 DIAGNOSIS — M542 Cervicalgia: Secondary | ICD-10-CM | POA: Diagnosis not present

## 2018-03-16 DIAGNOSIS — G894 Chronic pain syndrome: Secondary | ICD-10-CM

## 2018-03-16 DIAGNOSIS — Z79891 Long term (current) use of opiate analgesic: Secondary | ICD-10-CM

## 2018-03-16 DIAGNOSIS — Z79899 Other long term (current) drug therapy: Secondary | ICD-10-CM

## 2018-03-16 DIAGNOSIS — M7061 Trochanteric bursitis, right hip: Secondary | ICD-10-CM

## 2018-03-16 DIAGNOSIS — M5416 Radiculopathy, lumbar region: Secondary | ICD-10-CM | POA: Diagnosis not present

## 2018-03-16 DIAGNOSIS — M797 Fibromyalgia: Secondary | ICD-10-CM | POA: Diagnosis not present

## 2018-03-16 DIAGNOSIS — M25562 Pain in left knee: Secondary | ICD-10-CM

## 2018-03-16 DIAGNOSIS — M25561 Pain in right knee: Secondary | ICD-10-CM | POA: Diagnosis not present

## 2018-03-16 MED ORDER — MORPHINE SULFATE ER 30 MG PO TBCR: 30.0000 mg | EXTENDED_RELEASE_TABLET | Freq: Three times a day (TID) | ORAL | 0 refills | Status: DC

## 2018-03-16 NOTE — Progress Notes (Signed)
Subjective:    Patient ID: Cindy Pratt, female    DOB: Oct 01, 1965, 52 y.o.   MRN: [id number]  HPI: [name] is a 52 year old female who returns for follow up appointment for chronic pain and medication refill. She states her pain is located in her neck radiating into her bilateral shoulders, bilateral rib pain denies falling, mid-lower back pain radiating into her bilateral lower extremities and bilateral knee pain . Also reports she's been  having a Fibro Flare for the last 5 days. She rates her pain 9. Her current exercise regime is walking.  Cindy Pratt is 100.00 MME. Last Oral Swab was Performed on 09/19/2017, it was consistent. UDS ordered today.   Pain Inventory Average Pain 9 Pain Right Now 9 My pain is constant, sharp, burning, dull, stabbing, tingling and aching  In the last 24 hours, has pain interfered with the following? General activity 9 Relation with others 9 Enjoyment of life 9 What TIME of day is your pain at its worst? all Sleep (in general) NA  Pain is worse with: some activites Pain improves with: medication Relief from Meds: na  Mobility Do you have any goals in this area?  no  Function Do you have any goals in this area?  no  Neuro/Psych weakness numbness tremor tingling spasms dizziness confusion depression anxiety  Prior Studies Any changes since last visit?  no  Physicians involved in your care Any changes since last visit?  no   Family History  Problem Relation Age of Onset  . Hypertension Mother   . Hypertension Father   . Heart attack Father   . Breast cancer Maternal Grandmother   . Liver cancer Maternal Grandmother   . Cancer Maternal Grandmother        breast cancer  . Ovarian cancer Cousin   . Cancer Cousin        1st c. maternal side/ cervical cancer  . Cancer Cousin        1st c maternal/ colon cancer  . Diabetes Maternal Grandfather        both sides   Social History    Socioeconomic History  . Marital status: Married    Spouse name: [name]  . Number of children: 4  . Years of education: M  . Highest education level: Not on file  Occupational History  . Occupation: disabled    Employer: DISABLE  Social Needs  . Financial resource strain: Not on file  . Food insecurity:    Worry: Not on file    Inability: Not on file  . Transportation needs:    Medical: Not on file    Non-medical: Not on file  Tobacco Use  . Smoking status: Former Smoker    Packs/day: 1.00    Years: 20.00    Pack years: 20.00    Types: Cigarettes    Last attempt to quit: 07/31/2007    Years since quitting: 10.6  . Smokeless tobacco: Never Used  Substance and Sexual Activity  . Alcohol use: No  . Drug use: No  . Sexual activity: Yes    Birth control/protection: Surgical  Lifestyle  . Physical activity:    Days per week: Not on file    Minutes per session: Not on file  . Stress: Not on file  Relationships  . Social connections:    Talks on phone: Not on file    Gets together: Not on file    Attends religious service:  Not on file    Active member of club or organization: Not on file    Attends meetings of clubs or organizations: Not on file    Relationship status: Not on file  Other Topics Concern  . Not on file  Social History Narrative   Lives at home with husband Velda ShellHubert   Drinks 4 sodas a day   Past Surgical History:  Procedure Laterality Date  . ABDOMINAL HYSTERECTOMY    . BACK SURGERY  1997, 2003  . CYSTECTOMY    . DIAGNOSTIC LAPAROSCOPY    . WRIST ARTHROPLASTY Left   . WRIST ARTHROSCOPY WITH DEBRIDEMENT Right 11/13/2015   Procedure: RIGHT WRIST ARTHROSCOPY WITH DEBRIDEMENT;  Surgeon: Betha LoaKevin Kuzma, MD;  Location: [facility];  Service: Orthopedics;  Laterality: Right;   Past Medical History:  Diagnosis Date  . Anxiety   . Asthma   . Belchings   . Chronic pain syndrome   . Degenerative joint disease   . Depression   . Disturbance of  skin sensation   . Fibromyalgia   . GERD (gastroesophageal reflux disease)   . Hyperlipidemia   . Lumbar post-laminectomy syndrome   . Lumbosacral neuritis   . Osteoarthritis   . Other chronic postoperative pain   . Sciatica    BP 110/76   Pulse 74   Ht 5\' 3"  (1.6 m)   Wt 156 lb (70.8 kg)   SpO2 95%   BMI 27.63 kg/m   Opioid Risk Score:   Fall Risk Score:  `1  Depression screen PHQ 2/9  [facility] 03/16/2018 02/03/2018 01/06/2018 11/07/2017 10/10/2017 07/18/2017 05/23/2017  Decreased Interest 3 3 3 3 3  0 3  Down, Depressed, Hopeless 3 3 3 3 3  0 3  PHQ - 2 Score 6 6 6 6 6  0 6  Altered sleeping - - - - - - -  Tired, decreased energy - - - - - - -  Change in appetite - - - - - - -  Feeling bad or failure about yourself  - - - - - - -  Trouble concentrating - - - - - - -  Moving slowly or fidgety/restless - - - - - - -  Suicidal thoughts - - - - - - -  PHQ-9 Score - - - - - - -  Some recent data might be hidden    Review of Systems  Constitutional: Negative.   HENT: Negative.   Eyes: Negative.   Respiratory: Negative.   Cardiovascular: Negative.   Gastrointestinal: Negative.   Endocrine: Negative.   Genitourinary: Negative.   Musculoskeletal:       Spasms  Skin: Negative.   Allergic/Immunologic: Negative.   Neurological: Positive for dizziness, tremors, weakness and numbness.       Tingling  Hematological: Negative.   Psychiatric/Behavioral: Positive for confusion and dysphoric mood. The patient is nervous/anxious.   All other systems reviewed and are negative.      Objective:   Physical Exam  Constitutional: She is oriented to person, place, and time. She appears well-developed and well-nourished.  HENT:  Head: Normocephalic and atraumatic.  Neck: Normal range of motion. Neck supple.  Cervical Paraspinal Tenderness: C-5-C-6  Cardiovascular: Normal rate and regular rhythm.  Pulmonary/Chest: Effort normal and breath sounds normal.    Musculoskeletal:  Normal Muscle Bulk and Muscle Testing Reveals: Upper Extremities: Full ROM and Muscle Strength 5/5 Bilateral AC Joint Tenderness Thoracic and Lumbar Hypersensitivity Bilateral Greater Trochanter Tenderness Lower Extremities: Full  ROM and Muscle Strength 5/5 Arises from Table with ease Narrow Based gait  Neurological: She is alert and oriented to person, place, and time.  Skin: Skin is warm and dry.  Psychiatric: She has a normal mood and affect. Her behavior is normal.  Nursing note and vitals reviewed.         Assessment & Plan:  1.Lumbar postlaminectomy syndrome status post L5-S1 fusion radiating to LLE:  Lumbar Radiculitis: Continuecurrent medication regimen withTopamax. 03/16/2018 Refilled: MS contin 30 mg # 90 pills--use one pill every 8 hours for pain and ContinueTramadol 50 mg BID. #60. Second script e-scribe for the following month to accommodate scheduled appointment. We will continue the opioid monitoring program, this consists of regular clinic visits, examinations, urine drug screen, pill counts as well as use of [place] Controlled Substance reporting System. 2. Depression: Continue Current Medication Regimen of Prozac, Latuda and Wellbutrin. PCP following. 03/16/2018 3. Muscle Spasm: Continuecurrent medication regimen withTizanidine. 03/16/2018 4. Sacroiliac Joint Dysfunction: Continue to monitor.03/16/2018 5. Opioid Induces Constipation: Continue To Monitor 03/16/2018 6. Cervicalgia/ Cervical Radiculitis: Dr. Yevette Edwards Following: Continuecurrent medication regimen withTopamax. Continue to Monitor: Allergic: Gabapentin, Lyrica and Cymbalta. 03/16/2018 7. Fibromyalgia Syndrome: Continue Topamax and Continue HEP. 03/16/2018 8. Bilateral Greater Trochanter Bursitis: Continue to Alternate with Ice and Heat Therapy. 03/16/2018.  9. Bilateral Knee Pain: Continue current medication regimen. Continue to monitor. 03/16/2018.   20 minutes of  face to face patient care time was spent during this visit. All questions were encouraged and answered.  F/U in 1 month

## 2018-03-22 ENCOUNTER — Telehealth: Payer: Self-pay | Admitting: *Deleted

## 2018-03-22 LAB — 6-ACETYLMORPHINE,TOXASSURE ADD
6-ACETYLMORPHINE: NEGATIVE
6-acetylmorphine: NOT DETECTED ng/mg creat

## 2018-03-22 LAB — TOXASSURE SELECT,+ANTIDEPR,UR

## 2018-03-22 NOTE — Telephone Encounter (Signed)
Urine drug screen for this encounter is consistent for prescribed medication 

## 2018-04-10 ENCOUNTER — Other Ambulatory Visit: Payer: Self-pay

## 2018-04-10 ENCOUNTER — Emergency Department (HOSPITAL_COMMUNITY): Payer: Medicare Other

## 2018-04-10 ENCOUNTER — Emergency Department (HOSPITAL_COMMUNITY)
Admission: EM | Admit: 2018-04-10 | Discharge: 2018-04-10 | Disposition: A | Discharge status: Home / Self Care | Payer: Medicare Other | Attending: Emergency Medicine | Admitting: Emergency Medicine

## 2018-04-10 ENCOUNTER — Encounter (HOSPITAL_COMMUNITY): Payer: Self-pay | Admitting: Emergency Medicine

## 2018-04-10 DIAGNOSIS — N200 Calculus of kidney: Secondary | ICD-10-CM | POA: Insufficient documentation

## 2018-04-10 DIAGNOSIS — E119 Type 2 diabetes mellitus without complications: Secondary | ICD-10-CM | POA: Diagnosis not present

## 2018-04-10 DIAGNOSIS — Z79899 Other long term (current) drug therapy: Secondary | ICD-10-CM | POA: Insufficient documentation

## 2018-04-10 DIAGNOSIS — J45909 Unspecified asthma, uncomplicated: Secondary | ICD-10-CM | POA: Diagnosis not present

## 2018-04-10 DIAGNOSIS — Z87891 Personal history of nicotine dependence: Secondary | ICD-10-CM | POA: Diagnosis not present

## 2018-04-10 DIAGNOSIS — R1031 Right lower quadrant pain: Secondary | ICD-10-CM | POA: Diagnosis present

## 2018-04-10 LAB — URINALYSIS, ROUTINE W REFLEX MICROSCOPIC
BILIRUBIN URINE: NEGATIVE
Glucose, UA: NEGATIVE mg/dL
Ketones, ur: NEGATIVE mg/dL
LEUKOCYTES UA: NEGATIVE
NITRITE: NEGATIVE
PH: 6 (ref 5.0–8.0)
Protein, ur: NEGATIVE mg/dL
SPECIFIC GRAVITY, URINE: 1.011 (ref 1.005–1.030)

## 2018-04-10 LAB — COMPREHENSIVE METABOLIC PANEL
ALT: 30 U/L (ref 0–44)
AST: 24 U/L (ref 15–41)
Albumin: 3.7 g/dL (ref 3.5–5.0)
Alkaline Phosphatase: 102 U/L (ref 38–126)
Anion gap: 5 (ref 5–15)
BILIRUBIN TOTAL: 0.3 mg/dL (ref 0.3–1.2)
BUN: 20 mg/dL (ref 6–20)
CO2: 27 mmol/L (ref 22–32)
Calcium: 9.2 mg/dL (ref 8.9–10.3)
Chloride: 108 mmol/L (ref 98–111)
Creatinine, Ser: 0.96 mg/dL (ref 0.44–1.00)
GFR calc Af Amer: >60 mL/min (ref >60)
Glucose, Bld: 101 mg/dL — ABNORMAL HIGH (ref 70–99)
POTASSIUM: 4 mmol/L (ref 3.5–5.1)
Sodium: 140 mmol/L (ref 135–145)
Total Protein: 7 g/dL (ref 6.5–8.1)

## 2018-04-10 LAB — CBC
HEMATOCRIT: 38.3 % (ref 36.0–46.0)
Hemoglobin: 12.5 g/dL (ref 12.0–15.0)
MCH: 29.6 pg (ref 26.0–34.0)
MCHC: 32.6 g/dL (ref 30.0–36.0)
MCV: 90.5 fL (ref 78.0–100.0)
PLATELETS: 267 10*3/uL (ref 150–400)
RBC: 4.23 MIL/uL (ref 3.87–5.11)
RDW: 12.9 % (ref 11.5–15.5)
WBC: 8.7 10*3/uL (ref 4.0–10.5)

## 2018-04-10 LAB — LIPASE, BLOOD: Lipase: 30 U/L (ref 11–51)

## 2018-04-10 MED ORDER — ONDANSETRON 4 MG PO TBDP: ORAL_TABLET | ORAL | 0 refills | Status: DC

## 2018-04-10 MED ORDER — IOPAMIDOL (ISOVUE-300) INJECTION 61%
100.0000 mL | Freq: Once | INTRAVENOUS | Status: AC | PRN
  Administered 2018-04-10: 100 mL via INTRAVENOUS

## 2018-04-10 MED ORDER — ONDANSETRON HCL 4 MG/2ML IJ SOLN
4.0000 mg | Freq: Once | INTRAMUSCULAR | Status: AC
Start: 2018-04-10 — End: 2018-04-10
  Administered 2018-04-10: 4 mg via INTRAVENOUS
  Filled 2018-04-10: qty 2

## 2018-04-10 MED ORDER — ONDANSETRON HCL 4 MG/2ML IJ SOLN
4.0000 mg | Freq: Once | INTRAMUSCULAR | Status: AC
  Administered 2018-04-10: 4 mg via INTRAVENOUS
  Filled 2018-04-10: qty 2

## 2018-04-10 MED ORDER — HYDROMORPHONE HCL 1 MG/ML IJ SOLN
1.0000 mg | Freq: Once | INTRAMUSCULAR | Status: AC
  Administered 2018-04-10: 1 mg via INTRAVENOUS
  Filled 2018-04-10: qty 1

## 2018-04-10 MED ORDER — OXYCODONE-ACETAMINOPHEN 5-325 MG PO TABS: 1.0000 | ORAL_TABLET | Freq: Four times a day (QID) | ORAL | 0 refills | Status: DC | PRN

## 2018-04-10 NOTE — ED Triage Notes (Signed)
Pt c/o right lower abd pain x 2 days with nausea.  

## 2018-04-10 NOTE — ED Provider Notes (Signed)
[facility] EMERGENCY DEPARTMENT Provider Note   CSN: [id number] Arrival date & time: 04/10/18  2024     History   Chief Complaint Chief Complaint  Patient presents with  . Abdominal Pain    HPI Cindy Pratt is a 52 y.o. female.  Patient complains of severe right lower quadrant abdominal pain that came on suddenly  The history is provided by the patient. No language interpreter was used.  Abdominal Pain   This is a new problem. The current episode started 3 to 5 hours ago. The problem occurs constantly. The problem has not changed since onset.The pain is associated with an unknown factor. The pain is located in the RLQ. The quality of the pain is aching. The pain is at a severity of 8/10. The pain is moderate. Pertinent negatives include anorexia, diarrhea, frequency, hematuria and headaches. Nothing aggravates the symptoms. Nothing relieves the symptoms. Past workup does not include GI consult. Her past medical history does not include PUD.    Past Medical History:  Diagnosis Date  . Anxiety   . Asthma   . Belchings   . Chronic pain syndrome   . Degenerative joint disease   . Depression   . Disturbance of skin sensation   . Fibromyalgia   . GERD (gastroesophageal reflux disease)   . Hyperlipidemia   . Lumbar post-laminectomy syndrome   . Lumbosacral neuritis   . Osteoarthritis   . Other chronic postoperative pain   . Sciatica     Patient Active Problem List   Diagnosis Date Noted  . Postlaminectomy syndrome, lumbar region 10/21/2011  . Thoracic or lumbosacral neuritis or radiculitis, unspecified 10/21/2011  . Chronic pain syndrome 10/21/2011  . Sternoclavicular joint disorder 10/21/2011  . DIABETES MELLITUS, TYPE II 08/14/2009  . FLANK PAIN, RIGHT 08/05/2009  . HYPERHIDROSIS 05/26/2009  . SWELLING, MASS, OR LUMP IN CHEST 05/26/2009  . ABDOMINAL TENDERNESS, RIGHT UPPER QUADRANT 05/26/2009  . BACK PAIN 02/18/2009  . ANXIETY 07/22/2008  . DEPRESSION  07/22/2008  . PERIPHERAL NEUROPATHY 07/22/2008  . ALLERGIC RHINITIS 07/22/2008  . ASTHMA 07/22/2008  . LOW BACK PAIN 07/22/2008  . Fibromyalgia syndrome 07/22/2008  . Nonspecific (abnormal) findings on radiological and other examination of body structure 07/22/2008  . NEPHROLITHIASIS, HX OF 07/22/2008  . CHEST XRAY, ABNORMAL 07/22/2008    Past Surgical History:  Procedure Laterality Date  . ABDOMINAL HYSTERECTOMY    . BACK SURGERY  1997, 2003  . CYSTECTOMY    . DIAGNOSTIC LAPAROSCOPY    . WRIST ARTHROPLASTY Left   . WRIST ARTHROSCOPY WITH DEBRIDEMENT Right 11/13/2015   Procedure: RIGHT WRIST ARTHROSCOPY WITH DEBRIDEMENT;  Surgeon: Betha Loa, MD;  Location: [facility];  Service: Orthopedics;  Laterality: Right;     OB History   None      Home Medications    Prior to Admission medications   Medication Sig Start Date End Date Taking? Authorizing Provider  buPROPion (WELLBUTRIN SR) 150 MG 12 hr tablet Take 150 mg by mouth 2 (two) times daily.   Yes [provider]  FLUoxetine HCl 60 MG TABS Take 60 mg by mouth every evening.  03/28/15  Yes [provider]  lurasidone (LATUDA) 40 MG TABS tablet Take 40 mg by mouth at bedtime.    Yes [provider]  morphine (MS CONTIN) 30 MG 12 hr tablet Take 1 tablet (30 mg total) by mouth every 8 (eight) hours. 03/16/18  Yes Jones Bales, NP  tiZANidine (ZANAFLEX) 4 MG  tablet Take 1 tablet (4 mg total) by mouth 2 (two) times daily. 10/10/17  Yes Jones Baleshomas, Eunice L, NP  topiramate (TOPAMAX) 25 MG tablet Take 1 tablet (25 mg total) by mouth daily. 02/03/18  Yes Jones Baleshomas, Eunice L, NP  traMADol (ULTRAM) 50 MG tablet Take 1 tablet (50 mg total) by mouth 2 (two) times daily as needed. 02/03/18  Yes Jones Baleshomas, Eunice L, NP  ondansetron (ZOFRAN ODT) 4 MG disintegrating tablet 4mg  ODT q4 hours prn nausea/vomit 04/10/18   Bethann BerkshireZammit, Guila Owensby, MD  oxyCODONE-acetaminophen (PERCOCET/ROXICET) 5-325 MG tablet Take 1 tablet by mouth  every 6 (six) hours as needed. 04/10/18   Bethann BerkshireZammit, Shaka Cardin, MD    Family History Family History  Problem Relation Age of Onset  . Hypertension Mother   . Hypertension Father   . Heart attack Father   . Breast cancer Maternal Grandmother   . Liver cancer Maternal Grandmother   . Cancer Maternal Grandmother        breast cancer  . Ovarian cancer Cousin   . Cancer Cousin        1st c. maternal side/ cervical cancer  . Cancer Cousin        1st c maternal/ colon cancer  . Diabetes Maternal Grandfather        both sides    Social History Social History   Tobacco Use  . Smoking status: Former Smoker    Packs/day: 1.00    Years: 20.00    Pack years: 20.00    Types: Cigarettes    Last attempt to quit: 07/31/2007    Years since quitting: 10.7  . Smokeless tobacco: Never Used  Substance Use Topics  . Alcohol use: No  . Drug use: No     Allergies   Aspirin; Nsaids; Sulfa antibiotics; Sulfonamide derivatives; Tolmetin; Cymbalta [duloxetine hcl]; Duloxetine; Gabapentin; Other; and Pregabalin   Review of Systems Review of Systems  Constitutional: Negative for appetite change and fatigue.  HENT: Negative for congestion, ear discharge and sinus pressure.   Eyes: Negative for discharge.  Respiratory: Negative for cough.   Cardiovascular: Negative for chest pain.  Gastrointestinal: Positive for abdominal pain. Negative for anorexia and diarrhea.  Genitourinary: Negative for frequency and hematuria.  Musculoskeletal: Negative for back pain.  Skin: Negative for rash.  Neurological: Negative for seizures and headaches.  Psychiatric/Behavioral: Negative for hallucinations.     Physical Exam Updated Vital Signs BP (!) 143/84   Pulse 64   Temp 98.8 F (37.1 C)   Resp 18   Ht 5\' 3"  (1.6 m)   Wt 72.6 kg   SpO2 100%   BMI 28.34 kg/m   Physical Exam  Constitutional: She is oriented to person, place, and time. She appears well-developed.  HENT:  Head: Normocephalic.  Eyes:  Conjunctivae and EOM are normal. No scleral icterus.  Neck: Neck supple. No thyromegaly present.  Cardiovascular: Normal rate and regular rhythm. Exam reveals no gallop and no friction rub.  No murmur heard. Pulmonary/Chest: No stridor. She has no wheezes. She has no rales. She exhibits no tenderness.  Abdominal: She exhibits no distension. There is tenderness. There is no rebound.  Genitourinary:  Genitourinary Comments: Tender right flank  Musculoskeletal: Normal range of motion. She exhibits no edema.  Lymphadenopathy:    She has no cervical adenopathy.  Neurological: She is oriented to person, place, and time. She exhibits normal muscle tone. Coordination normal.  Skin: No rash noted. No erythema.  Psychiatric: She has a normal mood and affect. Her  behavior is normal.     ED Treatments / Results  Labs (all labs ordered are listed, but only abnormal results are displayed) Labs Reviewed  COMPREHENSIVE METABOLIC PANEL - Abnormal; Notable for the following components:      Result Value   Glucose, Bld 101 (*)    All other components within normal limits  URINALYSIS, ROUTINE W REFLEX MICROSCOPIC - Abnormal; Notable for the following components:   Hgb urine dipstick LARGE (*)    Bacteria, UA RARE (*)    All other components within normal limits  LIPASE, BLOOD  CBC    EKG None  Radiology Ct Abdomen Pelvis W Contrast  Result Date: 04/10/2018 CLINICAL DATA:  Abdominal pain and nausea for 2 days. EXAM: CT ABDOMEN AND PELVIS WITH CONTRAST TECHNIQUE: Multidetector CT imaging of the abdomen and pelvis was performed using the standard protocol following bolus administration of intravenous contrast. CONTRAST:  100mL ISOVUE-300 IOPAMIDOL (ISOVUE-300) INJECTION 61% COMPARISON:  CT abdomen pelvis 07/24/2013 FINDINGS: LOWER CHEST: No basilar pulmonary nodules or pleural effusion. No apical pericardial effusion. HEPATOBILIARY: Normal hepatic contours and density. No intra- or extrahepatic  biliary dilatation. Normal gallbladder. PANCREAS: Normal parenchymal contours without ductal dilatation. No peripancreatic fluid collection. SPLEEN: Normal. ADRENALS/URINARY TRACT: --Adrenal glands: Normal. --Right kidney/ureter: There is mild hydronephrosis and moderate hydroureter with periureteral stranding. Within the distal right ureter, there is an obstructing stone that measures 9 x 4 x 6 mm. There is a smaller adjacent stone fragment that measures 3 mm. --Left kidney/ureter: No hydronephrosis, nephroureterolithiasis, perinephric stranding or solid renal mass. --Urinary bladder: Normal for degree of distention STOMACH/BOWEL: --Stomach/Duodenum: No hiatal hernia or other gastric abnormality. Normal duodenal course. --Small bowel: No dilatation or inflammation. --Colon: No focal abnormality. --Appendix: Normal. VASCULAR/LYMPHATIC: Normal course and caliber of the major abdominal vessels. No abdominal or pelvic lymphadenopathy. REPRODUCTIVE: No free fluid in the pelvis. MUSCULOSKELETAL. L4-L5 posterior instrumented fusion. OTHER: None. IMPRESSION: 1. Acute right sided obstructive uropathy with 9 x 4 x 6 mm stone in the distal right ureter with smaller adjacent stone fragment measuring 3 mm. Mild-to-moderate hydroureteronephrosis and periureteral stranding. 2. No left nephrolithiasis. Electronically Signed   By: Deatra RobinsonKevin  Herman M.D.   On: 04/10/2018 22:31    Procedures Procedures (including critical care time)  Medications Ordered in ED Medications  HYDROmorphone (DILAUDID) injection 1 mg (1 mg Intravenous Given 04/10/18 2147)  ondansetron (ZOFRAN) injection 4 mg (4 mg Intravenous Given 04/10/18 2147)  iopamidol (ISOVUE-300) 61 % injection 100 mL (100 mLs Intravenous Contrast Given 04/10/18 2203)  HYDROmorphone (DILAUDID) injection 1 mg (1 mg Intravenous Given 04/10/18 2326)  ondansetron (ZOFRAN) injection 4 mg (4 mg Intravenous Given 04/10/18 2326)     Initial Impression / Assessment and Plan / ED  Course  I have reviewed the triage vital signs and the nursing notes.  Pertinent labs & imaging results that were available during my care of the patient were reviewed by me and considered in my medical decision making (see chart for details).    Patient with a large kidney stone on the right ureter.  Labs unremarkable otherwise except for blood in urine.  Patient sent home with Percocets and Zofran and will call [facility] in the morning.  She [facility] if she has problems  Final Clinical Impressions(s) / ED Diagnoses   Final diagnoses:  Kidney stones    ED Discharge Orders         Ordered    oxyCODONE-acetaminophen (PERCOCET/ROXICET) 5-325 MG  tablet  Every 6 hours PRN     04/10/18 2342    ondansetron (ZOFRAN ODT) 4 MG disintegrating tablet     04/10/18 2342           Bethann Berkshire, MD 04/10/18 2345

## 2018-04-10 NOTE — Discharge Instructions (Addendum)
Call [facility] tomorrow for an appointment this week.  Tell them that you have a large kidney stone and your seen here in the emergency department.  If you have problems before they see you then you should go to [facility] in [place]

## 2018-04-11 ENCOUNTER — Ambulatory Visit (INDEPENDENT_AMBULATORY_CARE_PROVIDER_SITE_OTHER): Payer: Medicare Other | Admitting: Urology

## 2018-04-11 DIAGNOSIS — N201 Calculus of ureter: Secondary | ICD-10-CM | POA: Diagnosis not present

## 2018-04-18 ENCOUNTER — Ambulatory Visit (INDEPENDENT_AMBULATORY_CARE_PROVIDER_SITE_OTHER): Payer: Medicare Other | Admitting: Urology

## 2018-04-18 ENCOUNTER — Other Ambulatory Visit: Payer: Self-pay | Admitting: Urology

## 2018-04-18 ENCOUNTER — Ambulatory Visit (HOSPITAL_COMMUNITY)
Admission: RE | Admit: 2018-04-18 | Discharge: 2018-04-18 | Disposition: A | Discharge status: Home / Self Care | Payer: Medicare Other | Source: Ambulatory Visit | Attending: Urology | Admitting: Urology

## 2018-04-18 DIAGNOSIS — N201 Calculus of ureter: Secondary | ICD-10-CM | POA: Diagnosis not present

## 2018-04-21 ENCOUNTER — Telehealth: Payer: Self-pay | Admitting: Physical Medicine & Rehabilitation

## 2018-04-21 MED ORDER — TIZANIDINE HCL 4 MG PO TABS: 4.0000 mg | ORAL_TABLET | Freq: Two times a day (BID) | ORAL | 5 refills | Status: DC

## 2018-04-21 NOTE — Telephone Encounter (Signed)
I have left a message for patient to give our office a call back to move her appointment time up from 3:20 to 2:40 on 04/26/18.  I did ask her to be here at 2:20 on 04/26/18.

## 2018-04-21 NOTE — Telephone Encounter (Signed)
Patient needs a refill on her muscle relaxer.  She said her pharmacy has faxed over several requests.  I told patient that I would get Cindy Pratt to send that to her pharmacy.

## 2018-04-21 NOTE — Telephone Encounter (Signed)
Tizanidine prescription sent

## 2018-04-24 ENCOUNTER — Other Ambulatory Visit: Payer: Self-pay | Admitting: Urology

## 2018-04-26 ENCOUNTER — Encounter: Payer: Self-pay | Admitting: Registered Nurse

## 2018-04-26 ENCOUNTER — Encounter: Payer: Medicare Other | Admitting: Registered Nurse

## 2018-04-26 ENCOUNTER — Encounter: Payer: Medicare Other | Attending: Registered Nurse | Admitting: Registered Nurse

## 2018-04-26 VITALS — BP 111/97 | HR 76 | Resp 14 | Ht 64.0 in | Wt 159.0 lb

## 2018-04-26 DIAGNOSIS — M5412 Radiculopathy, cervical region: Secondary | ICD-10-CM

## 2018-04-26 DIAGNOSIS — E785 Hyperlipidemia, unspecified: Secondary | ICD-10-CM | POA: Insufficient documentation

## 2018-04-26 DIAGNOSIS — M5416 Radiculopathy, lumbar region: Secondary | ICD-10-CM | POA: Diagnosis not present

## 2018-04-26 DIAGNOSIS — Z5181 Encounter for therapeutic drug level monitoring: Secondary | ICD-10-CM

## 2018-04-26 DIAGNOSIS — M961 Postlaminectomy syndrome, not elsewhere classified: Secondary | ICD-10-CM

## 2018-04-26 DIAGNOSIS — Z87891 Personal history of nicotine dependence: Secondary | ICD-10-CM | POA: Diagnosis not present

## 2018-04-26 DIAGNOSIS — M62838 Other muscle spasm: Secondary | ICD-10-CM

## 2018-04-26 DIAGNOSIS — F329 Major depressive disorder, single episode, unspecified: Secondary | ICD-10-CM | POA: Diagnosis not present

## 2018-04-26 DIAGNOSIS — J45909 Unspecified asthma, uncomplicated: Secondary | ICD-10-CM | POA: Insufficient documentation

## 2018-04-26 DIAGNOSIS — M1990 Unspecified osteoarthritis, unspecified site: Secondary | ICD-10-CM | POA: Insufficient documentation

## 2018-04-26 DIAGNOSIS — G8929 Other chronic pain: Secondary | ICD-10-CM | POA: Diagnosis present

## 2018-04-26 DIAGNOSIS — Z981 Arthrodesis status: Secondary | ICD-10-CM | POA: Insufficient documentation

## 2018-04-26 DIAGNOSIS — M542 Cervicalgia: Secondary | ICD-10-CM | POA: Diagnosis not present

## 2018-04-26 DIAGNOSIS — Z79899 Other long term (current) drug therapy: Secondary | ICD-10-CM

## 2018-04-26 DIAGNOSIS — G629 Polyneuropathy, unspecified: Secondary | ICD-10-CM | POA: Insufficient documentation

## 2018-04-26 DIAGNOSIS — M797 Fibromyalgia: Secondary | ICD-10-CM

## 2018-04-26 DIAGNOSIS — G894 Chronic pain syndrome: Secondary | ICD-10-CM

## 2018-04-26 MED ORDER — TRAMADOL HCL 50 MG PO TABS: 50.0000 mg | ORAL_TABLET | Freq: Three times a day (TID) | ORAL | 0 refills | Status: DC | PRN

## 2018-04-26 MED ORDER — TOPIRAMATE 25 MG PO TABS: 25.0000 mg | ORAL_TABLET | Freq: Two times a day (BID) | ORAL | 2 refills | Status: DC

## 2018-04-26 MED ORDER — MORPHINE SULFATE ER 30 MG PO TBCR: 30.0000 mg | EXTENDED_RELEASE_TABLET | Freq: Three times a day (TID) | ORAL | 0 refills | Status: DC

## 2018-04-26 NOTE — Progress Notes (Signed)
Subjective:    Patient ID: Cindy Pratt, female    DOB: 11/06/1965, 52 y.o.   MRN: [id number]  HPI: Cindy Pratt is a 52 year old female who returns for follow up appointment for chronic pain and medication refill. She states her pain is located in her neck radiating into her bilateral shoulders and lower back radiating into her left lower extremity. She rates her pain 7. Her current exercise regime is walking.   Cindy Pratt went to [facility] emergency department on 04/10/2018 for abdominal pain. She was diagnosed with Nephrolithias, she has followed up with [facility], note was reviewed. She never filled the Percocet prescription, she admits there were days she increase her Tramadol three times a day. Narcotic contract was reviewed, she verbalizes if she self medicate again this can lead to discharge.  Will increase Tramadol for a few weeks due to the above, she verbalizes understanding.   She's scheduled for Right Extracorporeal Shock Wave Lithotripsy with Dr. Vernie Ammonsttelin.   Cindy Pratt Morphine Equivalent is 100.00 MME. Last UDS was Performed on 03/16/2018, it was consistent.   Pain Inventory Average Pain 9 Pain Right Now 7 My pain is constant, sharp, burning, dull, stabbing, tingling and aching  In the last 24 hours, has pain interfered with the following? General activity 8 Relation with others 8 Enjoyment of life 8 What TIME of day is your pain at its worst? all Sleep (in general) Fair  Pain is worse with: ? Pain improves with: medication Relief from Meds: 5  Mobility Do you have any goals in this area?  no  Function Do you have any goals in this area?  no  Neuro/Psych weakness numbness tingling spasms dizziness depression anxiety  Prior Studies Any changes since last visit?  no  Physicians involved in your care Any changes since last visit?  no   Family History  Problem Relation Age of Onset  . Hypertension Mother   . Hypertension Father   .  Heart attack Father   . Breast cancer Maternal Grandmother   . Liver cancer Maternal Grandmother   . Cancer Maternal Grandmother        breast cancer  . Ovarian cancer Cousin   . Cancer Cousin        1st c. maternal side/ cervical cancer  . Cancer Cousin        1st c maternal/ colon cancer  . Diabetes Maternal Grandfather        both sides   Social History   Socioeconomic History  . Marital status: Married    Spouse name: Velda ShellHubert  . Number of children: 4  . Years of education: M  . Highest education level: Not on file  Occupational History  . Occupation: disabled    Employer: DISABLE  Social Needs  . Financial resource strain: Not on file  . Food insecurity:    Worry: Not on file    Inability: Not on file  . Transportation needs:    Medical: Not on file    Non-medical: Not on file  Tobacco Use  . Smoking status: Former Smoker    Packs/day: 1.00    Years: 20.00    Pack years: 20.00    Types: Cigarettes    Last attempt to quit: 07/31/2007    Years since quitting: 10.7  . Smokeless tobacco: Never Used  Substance and Sexual Activity  . Alcohol use: No  . Drug use: No  . Sexual activity: Yes  Birth control/protection: Surgical  Lifestyle  . Physical activity:    Days per week: Not on file    Minutes per session: Not on file  . Stress: Not on file  Relationships  . Social connections:    Talks on phone: Not on file    Gets together: Not on file    Attends religious service: Not on file    Active member of club or organization: Not on file    Attends meetings of clubs or organizations: Not on file    Relationship status: Not on file  Other Topics Concern  . Not on file  Social History Narrative   Lives at home with husband [name]   Drinks 4 sodas a day   Past Surgical History:  Procedure Laterality Date  . ABDOMINAL HYSTERECTOMY    . BACK SURGERY  1997, 2003  . CYSTECTOMY    . DIAGNOSTIC LAPAROSCOPY    . WRIST ARTHROPLASTY Left   . WRIST ARTHROSCOPY  WITH DEBRIDEMENT Right 11/13/2015   Procedure: RIGHT WRIST ARTHROSCOPY WITH DEBRIDEMENT;  Surgeon: Betha Loa, MD;  Location:  SURGERY CENTER;  Service: Orthopedics;  Laterality: Right;   Past Medical History:  Diagnosis Date  . Anxiety   . Asthma   . Belchings   . Chronic pain syndrome   . Degenerative joint disease   . Depression   . Disturbance of skin sensation   . Fibromyalgia   . GERD (gastroesophageal reflux disease)   . Hyperlipidemia   . Lumbar post-laminectomy syndrome   . Lumbosacral neuritis   . Osteoarthritis   . Other chronic postoperative pain   . Sciatica    BP (!) 111/97 (BP Location: Left Arm, Patient Position: Sitting, Cuff Size: Normal)   Pulse 76   Resp 14   Ht 5\' 4"  (1.626 m)   Wt 159 lb (72.1 kg)   SpO2 97%   BMI 27.29 kg/m   Opioid Risk Score:   Fall Risk Score:  `1  Depression screen PHQ 2/9  Depression screen [facility] 2/9 03/16/2018 02/03/2018 01/06/2018 11/07/2017 10/10/2017 07/18/2017 05/23/2017  Decreased Interest 3 3 3 3 3  0 3  Down, Depressed, Hopeless 3 3 3 3 3  0 3  PHQ - 2 Score 6 6 6 6 6  0 6  Altered sleeping - - - - - - -  Tired, decreased energy - - - - - - -  Change in appetite - - - - - - -  Feeling bad or failure about yourself  - - - - - - -  Trouble concentrating - - - - - - -  Moving slowly or fidgety/restless - - - - - - -  Suicidal thoughts - - - - - - -  PHQ-9 Score - - - - - - -  Some recent data might be hidden    Review of Systems  Constitutional: Negative.   HENT: Negative.   Eyes: Negative.   Respiratory: Negative.   Cardiovascular: Negative.   Gastrointestinal: Positive for abdominal pain.  Endocrine: Negative.   Genitourinary: Positive for flank pain.  Musculoskeletal: Positive for back pain and neck pain.       Spasms   Skin: Negative.   Allergic/Immunologic: Negative.   Neurological: Positive for dizziness, weakness and numbness.       Tingling   Psychiatric/Behavioral: Positive for dysphoric mood.  The patient is nervous/anxious.        Objective:   Physical Exam  Constitutional: She is oriented to person, place,  and time. She appears well-developed and well-nourished.  HENT:  Head: Normocephalic and atraumatic.  Neck: Normal range of motion. Neck supple.  Cervical Paraspinal Tenderness: C-5-C-6  Cardiovascular: Normal rate and regular rhythm.  Pulmonary/Chest: Effort normal and breath sounds normal.  Musculoskeletal:  Normal Muscle Bulk and Muscle Testing Reveals: Upper Extremities: Full ROM and Muscle Strength 5/5 Bilateral AC Joint Tenderness Lumbar Paraspinal Tenderness: L-3-L-5 Lower Extremities: Full ROM and Muscle Strength 5/5 Arises from Table with Ease Narrow Based Gait  Neurological: She is alert and oriented to person, place, and time.  Skin: Skin is warm and dry.  Psychiatric: She has a normal mood and affect. Her behavior is normal.  Nursing note and vitals reviewed.         Assessment & Plan:  1.Lumbar postlaminectomy syndrome status post L5-S1 fusion radiating to LLE:  Lumbar Radiculitis: Continuecurrent medication regimen withTopamax. 04/26/2018 Refilled:MS contin 30 mg #90pills--use one pill every 8 hours for pain and ContinueTramadol 50 mg BID. #60. Second script e-scribe for the following month to accommodate scheduled appointment. We will continue the opioid monitoring program, this consists of regular clinic visits, examinations, urine drug screen, pill counts as well as use of [place] Controlled Substance reporting System. 2. Depression: Continue Current Medication Regimen of Prozac, Latuda and Wellbutrin. PCP following. 04/26/2018 3. Muscle Spasm: Continuecurrent medication regimen withTizanidine. 04/26/2018 4. Sacroiliac Joint Dysfunction: Continue to monitor.04/26/2018 5. Opioid Induces Constipation: Continue To Monitor 04/26/2018 6. Cervicalgia/ Cervical Radiculitis: Dr. Yevette Edwards Following: Continuecurrent medication regimen  withTopamax. Continue to Monitor: Allergic: Gabapentin, Lyrica and Cymbalta. 04/26/2018 7. Fibromyalgia Syndrome: Increase Topamax  To BID and Continue HEP. 04/26/2018 8. Bilateral Greater Trochanter Bursitis:  No complaints today. Continue to Alternate with Ice and Heat Therapy. 04/26/2018. 9. Bilateral Knee Pain: No complaints today. Continue current medication regimen. Continue to monitor. 04/26/2018.   20 minutes of face to face patient care time was spent during this visit. All questions were encouraged and answered.  F/U in 1 month

## 2018-04-26 NOTE — Progress Notes (Deleted)
   Subjective:    Patient ID: Cindy Pratt, female    DOB: 05-24-1966, 52 y.o.   MRN: [id number]  HPI    Review of Systems     Objective:   Physical Exam        Assessment & Plan:

## 2018-04-26 NOTE — Patient Instructions (Signed)
May increase Tramadol to three times a day as needed for pain: for the next three weeks.

## 2018-04-27 NOTE — H&P (Signed)
HPI: Cindy Pratt is a 52 year-old female with a right he ureteral stone.  The problem is on the right side. She first stated noticing pain on approximately 04/10/2018. She has not caught a stone in her urine strainer since her symptoms began.   She has never had surgical treatment for calculi in the past.   8.13.2019: sudden onset right flank pain. CT A/P @ [facility]--4 by 9 mm right distal ureteral stone. No F/C, U/A clear. ? h/o prior stones. No prior GU eval.  She was started on MET.   8.20.2019: NO pain or hematuria over the past 2 weeks.     ALLERGIES: Aspirin NSAIDs sulfa    MEDICATIONS: Flomax 0.4 mg capsule 1 capsule PO Q HS  Latuda  Morphine Sulfate  Topamax  Tramadol Hcl  Wellbutrin Xl  Zanaflex     GU PSH: Hysterectomy      PSH Notes: wrist   NON-GU PSH: Back surgery    GU PMH: Ureteral calculus (Acute), Right, Rt distal stone--4 by 9 mm--may well pass. No complicating factors. No remaining renal stones - 04/11/2018    NON-GU PMH: Anxiety Arthritis Asthma Depression Hypercholesterolemia    FAMILY HISTORY: Cancer - Runs in Family heart - Father   SOCIAL HISTORY: Marital Status: Married Preferred Language: English; Ethnicity: Not Hispanic Or Latino; Race: White Current Smoking Status: Patient does not smoke anymore. Has not smoked since 03/30/2008.   Tobacco Use Assessment Completed: Used Tobacco in last 30 days? Has never drank.  Drinks 1 caffeinated drink per day. Patient's occupation is/was disabled.    REVIEW OF SYSTEMS:    GU Review Female:   Patient reports frequent urination. Patient denies hard to postpone urination, burning /pain with urination, get up at night to urinate, leakage of urine, stream starts and stops, trouble starting your stream, have to strain to urinate, and being pregnant.  Gastrointestinal (Upper):   Patient reports nausea. Patient denies vomiting and indigestion/ heartburn.  Gastrointestinal (Lower):   Patient denies  constipation and diarrhea.  Constitutional:   Patient reports fatigue. Patient denies fever, night sweats, and weight loss.  Skin:   Patient denies skin rash/ lesion and itching.  Eyes:   Patient denies blurred vision and double vision.  Ears/ Nose/ Throat:   Patient denies sore throat and sinus problems.  Hematologic/Lymphatic:   Patient reports easy bruising. Patient denies swollen glands.  Cardiovascular:   Patient denies leg swelling and chest pains.  Respiratory:   Patient denies cough and shortness of breath.  Endocrine:   Patient denies excessive thirst.  Musculoskeletal:   Patient reports back pain and joint pain.   Neurological:   Patient reports headaches and dizziness.   Psychologic:   Patient reports depression and anxiety.    VITAL SIGNS:    BP 105/75 mmHg  Pulse 87 /min  Temperature 98.6 F / 37 C   MULTI-SYSTEM PHYSICAL EXAMINATION:    Constitutional: Well-nourished. No physical deformities. Normally developed. Good grooming.  Neck: Neck symmetrical, not swollen. Normal tracheal position.  Respiratory: No labored breathing, no use of accessory muscles.   Skin: No paleness, no jaundice, no cyanosis. No lesion, no ulcer, no rash.  Neurologic / Psychiatric: Oriented to time, oriented to place, oriented to person. No depression, no anxiety, no agitation.  Eyes: Normal conjunctivae. Normal eyelids.  Ears, Nose, Mouth, and Throat: Left ear no scars, no lesions, no masses. Right ear no scars, no lesions, no masses. Nose no scars, no lesions, no masses. Normal hearing. Normal  lips.  Musculoskeletal: Normal gait and station of head and neck.     PAST DATA REVIEWED:  Source Of History:  Patient  X-Ray Review: KUB: Reviewed Films. Discussed With Patient. persistent right distal ureteral stone    PROCEDURES:          Unable to Void Patient unable to void   ASSESSMENT/PLAN:      ICD-10 Details  1 GU:   Ureteral calculus - N20.1 Right, Non progressing    Treatment options  discussed with pt/spouse. ESL and URS/HLL and sxtractions. Risks/complications/stone free rates also discussed. She would like to proceed w/ ESL.

## 2018-04-27 NOTE — Discharge Instructions (Signed)
Lithotripsy, Care After °This sheet gives you information about how to care for yourself after your procedure. Your health care provider may also give you more specific instructions. If you have problems or questions, contact your health care provider. °What can I expect after the procedure? °After the procedure, it is common to have: °· Some blood in your urine. This should only last for a few days. °· Soreness in your back, sides, or upper abdomen for a few days. °· Blotches or bruises on your back where the pressure wave entered the skin. °· Pain, discomfort, or nausea when pieces (fragments) of the kidney stone move through the tube that carries urine from the kidney to the bladder (ureter). Stone fragments may pass soon after the procedure, but they may continue to pass for up to 4-8 weeks. °? If you have severe pain or nausea, contact your health care provider. This may be caused by a large stone that was not broken up, and this may mean that you need more treatment. °· Some pain or discomfort during urination. °· Some pain or discomfort in the lower abdomen or (in men) at the base of the penis. ° °Follow these instructions at home: °Medicines °· Take over-the-counter and prescription medicines only as told by your health care provider. °· If you were prescribed an antibiotic medicine, take it as told by your health care provider. Do not stop taking the antibiotic even if you start to feel better. °· Do not drive for 24 hours if you were given a medicine to help you relax (sedative). °· Do not drive or use heavy machinery while taking prescription pain medicine. °Eating and drinking °· Drink enough water and fluids to keep your urine clear or pale yellow. This helps any remaining pieces of the stone to pass. It can also help prevent new stones from forming. °· Eat plenty of fresh fruits and vegetables. °· Follow instructions from your health care provider about eating and drinking restrictions. You may be  instructed: °? To reduce how much salt (sodium) you eat or drink. Check ingredients and nutrition facts on packaged foods and beverages. °? To reduce how much meat you eat. °· Eat the recommended amount of calcium for your age and gender. Ask your health care provider how much calcium you should have. °General instructions °· Get plenty of rest. °· Most people can resume normal activities 1-2 days after the procedure. Ask your health care provider what activities are safe for you. °· If directed, strain all urine through the strainer that was provided by your health care provider. °? Keep all fragments for your health care provider to see. Any stones that are found may be sent to a medical lab for examination. The stone may be as small as a grain of salt. °· Keep all follow-up visits as told by your health care provider. This is important. °Contact a health care provider if: °· You have pain that is severe or does not get better with medicine. °· You have nausea that is severe or does not go away. °· You have blood in your urine longer than your health care provider told you to expect. °· You have more blood in your urine. °· You have pain during urination that does not go away. °· You urinate more frequently than usual and this does not go away. °· You develop a rash or any other possible signs of an allergic reaction. °Get help right away if: °· You have severe pain in   your back, sides, or upper abdomen. °· You have severe pain while urinating. °· Your urine is very dark red. °· You have blood in your stool (feces). °· You cannot pass any urine at all. °· You feel a strong urge to urinate after emptying your bladder. °· You have a fever or chills. °· You develop shortness of breath, difficulty breathing, or chest pain. °· You have severe nausea that leads to persistent vomiting. °· You faint. °Summary °· After this procedure, it is common to have some pain, discomfort, or nausea when pieces (fragments) of the  kidney stone move through the tube that carries urine from the kidney to the bladder (ureter). If this pain or nausea is severe, however, you should contact your health care provider. °· Most people can resume normal activities 1-2 days after the procedure. Ask your health care provider what activities are safe for you. °· Drink enough water and fluids to keep your urine clear or pale yellow. This helps any remaining pieces of the stone to pass, and it can help prevent new stones from forming. °· If directed, strain your urine and keep all fragments for your health care provider to see. Fragments or stones may be as small as a grain of salt. °· Get help right away if you have severe pain in your back, sides, or upper abdomen or have severe pain while urinating. °This information is not intended to replace advice given to you by your health care provider. Make sure you discuss any questions you have with your health care provider. °Document Released: 09/05/2007 Document Revised: 07/07/2016 Document Reviewed: 07/07/2016 °Elsevier Interactive Patient Education © 2018 [employer] ° °

## 2018-05-02 ENCOUNTER — Encounter (HOSPITAL_COMMUNITY): Payer: Self-pay | Admitting: General Practice

## 2018-05-03 ENCOUNTER — Encounter (HOSPITAL_COMMUNITY): Payer: Self-pay | Admitting: Certified Registered Nurse Anesthetist

## 2018-05-03 ENCOUNTER — Telehealth: Payer: Self-pay | Admitting: *Deleted

## 2018-05-03 NOTE — Telephone Encounter (Signed)
No issue

## 2018-05-03 NOTE — Telephone Encounter (Signed)
Selita called from [facility]. Mrs Cindy Pratt is having procedure for kidney stones tomorrow and was told they needed to get ok from our office to proceed. Left message for Selita to call back.

## 2018-05-03 NOTE — Telephone Encounter (Signed)
Cindy Pratt called back from [facility] and they have a new protocol in place [facility]) when a patient is done under anesthesia they may receive fentanyl or propofol and they notify the pain management physician.  She is faxing over the protocol for Dr Wynn Banker to review. I alerted her that he is not in the office today but will let him know. (the procedure is tomorrow) Please advise if there is an issue with her receiving this medication.

## 2018-05-03 NOTE — Telephone Encounter (Signed)
Called [facility] back and left message letting the surgery scheduler know that there is no issue.

## 2018-05-04 ENCOUNTER — Encounter (HOSPITAL_COMMUNITY)
Admission: RE | Disposition: A | Discharge status: Home / Self Care | Payer: Self-pay | Source: Ambulatory Visit | Attending: Urology

## 2018-05-04 ENCOUNTER — Encounter (HOSPITAL_COMMUNITY): Payer: Self-pay | Admitting: General Practice

## 2018-05-04 ENCOUNTER — Ambulatory Visit (HOSPITAL_COMMUNITY): Payer: Medicare Other | Admitting: Anesthesiology

## 2018-05-04 ENCOUNTER — Ambulatory Visit (HOSPITAL_COMMUNITY): Payer: Medicare Other

## 2018-05-04 ENCOUNTER — Ambulatory Visit (HOSPITAL_COMMUNITY)
Admission: RE | Admit: 2018-05-04 | Discharge: 2018-05-04 | Disposition: A | Discharge status: Home / Self Care | Payer: Medicare Other | Source: Ambulatory Visit | Attending: Urology | Admitting: Urology

## 2018-05-04 DIAGNOSIS — E119 Type 2 diabetes mellitus without complications: Secondary | ICD-10-CM | POA: Diagnosis not present

## 2018-05-04 DIAGNOSIS — J45909 Unspecified asthma, uncomplicated: Secondary | ICD-10-CM | POA: Diagnosis not present

## 2018-05-04 DIAGNOSIS — M1990 Unspecified osteoarthritis, unspecified site: Secondary | ICD-10-CM | POA: Insufficient documentation

## 2018-05-04 DIAGNOSIS — K219 Gastro-esophageal reflux disease without esophagitis: Secondary | ICD-10-CM | POA: Diagnosis not present

## 2018-05-04 DIAGNOSIS — Z87891 Personal history of nicotine dependence: Secondary | ICD-10-CM | POA: Insufficient documentation

## 2018-05-04 DIAGNOSIS — F419 Anxiety disorder, unspecified: Secondary | ICD-10-CM | POA: Insufficient documentation

## 2018-05-04 DIAGNOSIS — F329 Major depressive disorder, single episode, unspecified: Secondary | ICD-10-CM | POA: Insufficient documentation

## 2018-05-04 DIAGNOSIS — N201 Calculus of ureter: Secondary | ICD-10-CM | POA: Insufficient documentation

## 2018-05-04 DIAGNOSIS — E7800 Pure hypercholesterolemia, unspecified: Secondary | ICD-10-CM | POA: Insufficient documentation

## 2018-05-04 HISTORY — PX: EXTRACORPOREAL SHOCK WAVE LITHOTRIPSY: SHX1557

## 2018-05-04 HISTORY — DX: Personal history of urinary calculi: Z87.442

## 2018-05-04 SURGERY — LITHOTRIPSY, ESWL
Anesthesia: Monitor Anesthesia Care | Laterality: Right

## 2018-05-04 MED ORDER — DEXAMETHASONE SODIUM PHOSPHATE 4 MG/ML IJ SOLN
INTRAMUSCULAR | Status: DC | PRN
  Administered 2018-05-04: 10 mg via INTRAVENOUS

## 2018-05-04 MED ORDER — ONDANSETRON HCL 4 MG/2ML IJ SOLN
INTRAMUSCULAR | Status: DC | PRN
  Administered 2018-05-04: 4 mg via INTRAVENOUS

## 2018-05-04 MED ORDER — DIAZEPAM 5 MG PO TABS: 10.0000 mg | ORAL_TABLET | ORAL | Status: DC

## 2018-05-04 MED ORDER — TAMSULOSIN HCL 0.4 MG PO CAPS: 0.4000 mg | ORAL_CAPSULE | ORAL | 0 refills | Status: DC

## 2018-05-04 MED ORDER — SODIUM CHLORIDE 0.9 % IV SOLN
INTRAVENOUS | Status: DC
  Administered 2018-05-04: 08:00:00 via INTRAVENOUS

## 2018-05-04 MED ORDER — FENTANYL CITRATE (PF) 100 MCG/2ML IJ SOLN
25.0000 ug | INTRAMUSCULAR | Status: DC | PRN
  Administered 2018-05-04: 100 ug via INTRAVENOUS

## 2018-05-04 MED ORDER — MIDAZOLAM HCL 5 MG/5ML IJ SOLN
INTRAMUSCULAR | Status: DC | PRN
  Administered 2018-05-04: 2 mg via INTRAVENOUS

## 2018-05-04 MED ORDER — PROMETHAZINE HCL 25 MG/ML IJ SOLN: 6.2500 mg | INTRAMUSCULAR | Status: DC | PRN

## 2018-05-04 MED ORDER — FENTANYL CITRATE (PF) 100 MCG/2ML IJ SOLN
INTRAMUSCULAR | Status: AC
  Filled 2018-05-04: qty 2

## 2018-05-04 MED ORDER — PROPOFOL 10 MG/ML IV BOLUS
INTRAVENOUS | Status: AC
  Filled 2018-05-04: qty 40

## 2018-05-04 MED ORDER — DIPHENHYDRAMINE HCL 25 MG PO CAPS: 25.0000 mg | ORAL_CAPSULE | ORAL | Status: DC

## 2018-05-04 MED ORDER — PROPOFOL 10 MG/ML IV BOLUS
INTRAVENOUS | Status: AC
  Filled 2018-05-04: qty 100

## 2018-05-04 MED ORDER — LEVOFLOXACIN 500 MG PO TABS
500.0000 mg | ORAL_TABLET | ORAL | Status: AC
  Administered 2018-05-04: 500 mg via ORAL
  Filled 2018-05-04: qty 1

## 2018-05-04 MED ORDER — PROPOFOL 10 MG/ML IV BOLUS
INTRAVENOUS | Status: DC | PRN
  Administered 2018-05-04: 440 mg via INTRAVENOUS

## 2018-05-04 MED ORDER — LIDOCAINE 2% (20 MG/ML) 5 ML SYRINGE
INTRAMUSCULAR | Status: DC | PRN
  Administered 2018-05-04: 60 mg via INTRAVENOUS

## 2018-05-04 MED ORDER — MIDAZOLAM HCL 2 MG/2ML IJ SOLN
INTRAMUSCULAR | Status: AC
  Filled 2018-05-04: qty 2

## 2018-05-04 NOTE — Op Note (Signed)
See [facility] OP note scanned into chart. Also because of the size, density, location and other factors that cannot be anticipated I feel this will likely be a staged procedure. This fact supersedes any indication in the scanned [facility] operative note to the contrary.  

## 2018-05-04 NOTE — Anesthesia Postprocedure Evaluation (Signed)
Anesthesia Post Note  Patient: Cindy Pratt  Procedure(Pratt) Performed: RIGHT EXTRACORPOREAL SHOCK WAVE LITHOTRIPSY (ESWL) WITH MAC (Right )     Patient location during evaluation: PACU Anesthesia Type: MAC Level of consciousness: awake and alert Pain management: pain level controlled Vital Signs Assessment: post-procedure vital signs reviewed and stable Respiratory status: spontaneous breathing, nonlabored ventilation, respiratory function stable and patient connected to nasal cannula oxygen Cardiovascular status: stable and blood pressure returned to baseline Postop Assessment: no apparent nausea or vomiting Anesthetic complications: no    Last Vitals:  Vitals:   05/04/18 1115 05/04/18 1130  BP: 114/74 111/79  Pulse: (!) 54 (!) 53  Resp: 13 12  Temp: 36.6 C   SpO2: 98% 99%    Last Pain:  Vitals:   05/04/18 1130  TempSrc:   PainSc: Asleep                 Cindy Pratt

## 2018-05-04 NOTE — Anesthesia Preprocedure Evaluation (Signed)
Anesthesia Evaluation  Patient identified by MRN, date of birth, ID band Patient awake    Reviewed: Allergy & Precautions, NPO status , Patient's Chart, lab work & pertinent test results  Airway Mallampati: II  TM Distance: >3 FB Neck ROM: Full    Dental no notable dental hx.    Pulmonary neg pulmonary ROS, former smoker,    Pulmonary exam normal breath sounds clear to auscultation       Cardiovascular negative cardio ROS Normal cardiovascular exam Rhythm:Regular Rate:Normal     Neuro/Psych Anxiety Depression Chronic narcotic use    GI/Hepatic Neg liver ROS, GERD  ,  Endo/Other  diabetes  Renal/GU negative Renal ROS  negative genitourinary   Musculoskeletal negative musculoskeletal ROS (+)   Abdominal   Peds negative pediatric ROS (+)  Hematology negative hematology ROS (+)   Anesthesia Other Findings   Reproductive/Obstetrics negative OB ROS                             Anesthesia Physical Anesthesia Plan  ASA: III  Anesthesia Plan: MAC   Post-op Pain Management:    Induction: Intravenous  PONV Risk Score and Plan: 0  Airway Management Planned: Simple Face Mask  Additional Equipment:   Intra-op Plan:   Post-operative Plan:   Informed Consent: I have reviewed the patients History and Physical, chart, labs and discussed the procedure including the risks, benefits and alternatives for the proposed anesthesia with the patient or authorized representative who has indicated his/her understanding and acceptance.   Dental advisory given  Plan Discussed with: CRNA and Surgeon  Anesthesia Plan Comments:         Anesthesia Quick Evaluation

## 2018-05-04 NOTE — Addendum Note (Signed)
Addendum  created 05/04/18 1146 by Vanessa Wilson, CRNA   Intraprocedure Event deleted, Intraprocedure Event edited, Intraprocedure Staff edited

## 2018-05-04 NOTE — Transfer of Care (Signed)
Immediate Anesthesia Transfer of Care Note  Patient: Cindy Pratt  Procedure(s) Performed: RIGHT EXTRACORPOREAL SHOCK WAVE LITHOTRIPSY (ESWL) WITH MAC (Right )  Patient Location: PACU  Anesthesia Type:MAC  Level of Consciousness: drowsy  Airway & Oxygen Therapy: Patient Spontanous Breathing  Post-op Assessment: Report given to RN and Post -op Vital signs reviewed and stable  Post vital signs: Reviewed and stable  Last Vitals:  Vitals Value Taken Time  BP 114/74 05/04/2018 11:15 AM  Temp    Pulse 53 05/04/2018 11:18 AM  Resp    SpO2 100 % 05/04/2018 11:18 AM  Vitals shown include unvalidated device data.  Last Pain:  Vitals:   05/04/18 0813  TempSrc:   PainSc: 0-No pain         Complications: No apparent anesthesia complications

## 2018-05-22 ENCOUNTER — Encounter: Payer: Medicare Other | Attending: Registered Nurse | Admitting: Registered Nurse

## 2018-05-22 ENCOUNTER — Other Ambulatory Visit: Payer: Self-pay

## 2018-05-22 ENCOUNTER — Encounter (HOSPITAL_COMMUNITY): Payer: Self-pay | Admitting: Urology

## 2018-05-22 VITALS — BP 100/68 | HR 71 | Ht 64.0 in | Wt 159.2 lb

## 2018-05-22 DIAGNOSIS — M5416 Radiculopathy, lumbar region: Secondary | ICD-10-CM

## 2018-05-22 DIAGNOSIS — G629 Polyneuropathy, unspecified: Secondary | ICD-10-CM | POA: Insufficient documentation

## 2018-05-22 DIAGNOSIS — E785 Hyperlipidemia, unspecified: Secondary | ICD-10-CM | POA: Diagnosis not present

## 2018-05-22 DIAGNOSIS — M1990 Unspecified osteoarthritis, unspecified site: Secondary | ICD-10-CM | POA: Diagnosis not present

## 2018-05-22 DIAGNOSIS — G8929 Other chronic pain: Secondary | ICD-10-CM | POA: Diagnosis present

## 2018-05-22 DIAGNOSIS — M5412 Radiculopathy, cervical region: Secondary | ICD-10-CM

## 2018-05-22 DIAGNOSIS — Z87891 Personal history of nicotine dependence: Secondary | ICD-10-CM | POA: Insufficient documentation

## 2018-05-22 DIAGNOSIS — G894 Chronic pain syndrome: Secondary | ICD-10-CM

## 2018-05-22 DIAGNOSIS — M62838 Other muscle spasm: Secondary | ICD-10-CM

## 2018-05-22 DIAGNOSIS — M7061 Trochanteric bursitis, right hip: Secondary | ICD-10-CM

## 2018-05-22 DIAGNOSIS — Z79891 Long term (current) use of opiate analgesic: Secondary | ICD-10-CM

## 2018-05-22 DIAGNOSIS — M797 Fibromyalgia: Secondary | ICD-10-CM

## 2018-05-22 DIAGNOSIS — M542 Cervicalgia: Secondary | ICD-10-CM | POA: Diagnosis not present

## 2018-05-22 DIAGNOSIS — M25562 Pain in left knee: Secondary | ICD-10-CM

## 2018-05-22 DIAGNOSIS — F329 Major depressive disorder, single episode, unspecified: Secondary | ICD-10-CM | POA: Diagnosis not present

## 2018-05-22 DIAGNOSIS — J45909 Unspecified asthma, uncomplicated: Secondary | ICD-10-CM | POA: Insufficient documentation

## 2018-05-22 DIAGNOSIS — M7062 Trochanteric bursitis, left hip: Secondary | ICD-10-CM

## 2018-05-22 DIAGNOSIS — Z981 Arthrodesis status: Secondary | ICD-10-CM | POA: Insufficient documentation

## 2018-05-22 DIAGNOSIS — M961 Postlaminectomy syndrome, not elsewhere classified: Secondary | ICD-10-CM

## 2018-05-22 DIAGNOSIS — M25561 Pain in right knee: Secondary | ICD-10-CM

## 2018-05-22 DIAGNOSIS — M2550 Pain in unspecified joint: Secondary | ICD-10-CM

## 2018-05-22 DIAGNOSIS — Z5181 Encounter for therapeutic drug level monitoring: Secondary | ICD-10-CM

## 2018-05-22 MED ORDER — MORPHINE SULFATE ER 30 MG PO TBCR: 30.0000 mg | EXTENDED_RELEASE_TABLET | Freq: Three times a day (TID) | ORAL | 0 refills | Status: DC

## 2018-05-22 MED ORDER — TRAMADOL HCL 50 MG PO TABS: 50.0000 mg | ORAL_TABLET | Freq: Three times a day (TID) | ORAL | 0 refills | Status: DC | PRN

## 2018-05-22 NOTE — Progress Notes (Signed)
Subjective:    Patient ID: Cindy Pratt, female    DOB: 01-30-1966, 52 y.o.   MRN: [id number]  HPI: Cindy Pratt is a 52 year old female who returns for follow up appointment for chronic pain and medication refill. She states her pain is located in her neck radiating into her bilateral shoulders, bilateral elbow pain, lower back, bilateral hips, bilateral knees and bilateral ankles. She rates her pain 8. Her current exercise regime is walking.   Ms. Monforte had a Right Extracorporeal shock wave Lithotripsy on 05/04/2018, by Dr. Vernie Ammons.   Ms. Mandile Morphine Equivalent is 105.00 MME. Last UDS was Performed on 03/16/2018, it was consistent.   Pain Inventory Average Pain 9 Pain Right Now 8 My pain is sharp, burning, dull, stabbing, tingling and aching  In the last 24 hours, has pain interfered with the following? General activity 0 Relation with others 0 Enjoyment of life 0 What TIME of day is your pain at its worst? varies Sleep (in general) Good  Pain is worse with: none Pain improves with: medication Relief from Meds: 6  Mobility Do you have any goals in this area?  no  Function Do you have any goals in this area?  no  Neuro/Psych weakness numbness tremor tingling spasms dizziness confusion depression anxiety  Prior Studies Any changes since last visit?  no  Physicians involved in your care Any changes since last visit?  no   Family History  Problem Relation Age of Onset  . Hypertension Mother   . Hypertension Father   . Heart attack Father   . Breast cancer Maternal Grandmother   . Liver cancer Maternal Grandmother   . Cancer Maternal Grandmother        breast cancer  . Ovarian cancer Cousin   . Cancer Cousin        1st c. maternal side/ cervical cancer  . Cancer Cousin        1st c maternal/ colon cancer  . Diabetes Maternal Grandfather        both sides   Social History   Socioeconomic History  . Marital status: Married    Spouse  name: [name]  . Number of children: 4  . Years of education: M  . Highest education level: Not on file  Occupational History  . Occupation: disabled    Employer: DISABLE  Social Needs  . Financial resource strain: Not on file  . Food insecurity:    Worry: Not on file    Inability: Not on file  . Transportation needs:    Medical: Not on file    Non-medical: Not on file  Tobacco Use  . Smoking status: Former Smoker    Packs/day: 1.00    Years: 20.00    Pack years: 20.00    Types: Cigarettes    Last attempt to quit: 07/31/2007    Years since quitting: 10.8  . Smokeless tobacco: Never Used  Substance and Sexual Activity  . Alcohol use: No  . Drug use: No  . Sexual activity: Yes    Birth control/protection: Surgical  Lifestyle  . Physical activity:    Days per week: Not on file    Minutes per session: Not on file  . Stress: Not on file  Relationships  . Social connections:    Talks on phone: Not on file    Gets together: Not on file    Attends religious service: Not on file    Active member of club  or organization: Not on file    Attends meetings of clubs or organizations: Not on file    Relationship status: Not on file  Other Topics Concern  . Not on file  Social History Narrative   Lives at home with husband Velda ShellHubert   Drinks 4 sodas a day   Past Surgical History:  Procedure Laterality Date  . ABDOMINAL HYSTERECTOMY    . BACK SURGERY  1997, 2003  . CYSTECTOMY    . DIAGNOSTIC LAPAROSCOPY    . WRIST ARTHROPLASTY Left   . WRIST ARTHROSCOPY WITH DEBRIDEMENT Right 11/13/2015   Procedure: RIGHT WRIST ARTHROSCOPY WITH DEBRIDEMENT;  Surgeon: Betha LoaKevin Kuzma, MD;  Location: [facility];  Service: Orthopedics;  Laterality: Right;   Past Medical History:  Diagnosis Date  . Anxiety   . Asthma   . Belchings   . Chronic pain syndrome    in pain clinic  . Degenerative joint disease   . Depression   . Disturbance of skin sensation   . Fibromyalgia   . GERD  (gastroesophageal reflux disease)   . History of kidney stones   . Hyperlipidemia   . Lumbar post-laminectomy syndrome   . Lumbosacral neuritis   . Osteoarthritis   . Other chronic postoperative pain   . Sciatica    BP 100/68   Pulse 71   Ht 5\' 4"  (1.626 m)   Wt 159 lb 3.2 oz (72.2 kg)   SpO2 93%   BMI 27.33 kg/m   Opioid Risk Score:   Fall Risk Score:  `1  Depression screen PHQ 2/9  [facility]/9 05/22/2018 03/16/2018 02/03/2018 01/06/2018 11/07/2017 10/10/2017 07/18/2017  Decreased Interest 1 3 3 3 3 3  0  Down, Depressed, Hopeless 1 3 3 3 3 3  0  PHQ - 2 Score 2 6 6 6 6 6  0  Altered sleeping - - - - - - -  Tired, decreased energy - - - - - - -  Change in appetite - - - - - - -  Feeling bad or failure about yourself  - - - - - - -  Trouble concentrating - - - - - - -  Moving slowly or fidgety/restless - - - - - - -  Suicidal thoughts - - - - - - -  PHQ-9 Score - - - - - - -  Some recent data might be hidden    Review of Systems  Constitutional: Negative.   HENT: Negative.   Eyes: Negative.   Respiratory: Negative.   Cardiovascular: Negative.   Gastrointestinal: Negative.   Endocrine: Negative.   Genitourinary: Negative.   Musculoskeletal: Negative.   Skin: Negative.   Allergic/Immunologic: Negative.   Neurological: Negative.   Hematological: Negative.   Psychiatric/Behavioral: Negative.   All other systems reviewed and are negative.      Objective:   Physical Exam  Constitutional: She is oriented to person, place, and time. She appears well-developed and well-nourished.  HENT:  Head: Normocephalic and atraumatic.  Neck: Normal range of motion. Neck supple.  Cardiovascular: Normal rate and regular rhythm.  Pulmonary/Chest: Effort normal and breath sounds normal.  Musculoskeletal:  Normal Muscle Bulk and Muscle Testing Reveals: Upper Extremities: Full ROM and Muscle Strength 5/5 Bilateral AC Joint Tenderness Thoracic Paraspinal tenderness:  T-1-T-7 Lumbar Paraspinal Tenderness: L4-L-5 Lower Extremities: Full ROM and Muscle Strength 5/5 Left Lower Extremity Flexion Produces Pain into Patella Arises from Table with Ease Narrow Based gait   Neurological: She is alert and  oriented to person, place, and time.  Skin: Skin is warm and dry.  Psychiatric: She has a normal mood and affect. Her behavior is normal.  Nursing note and vitals reviewed.         Assessment & Plan:  1.Lumbar postlaminectomy syndrome status post L5-S1 fusion radiating to LLE: Lumbar Radiculitis: Continuecurrent medication regimen withTopamax. 05/22/2018 Refilled:MS contin 30 mg #90pills--use one pill every 8 hours for pain and ContinueTramadol 50 mg BID. #60. We will continue the opioid monitoring program, this consists of regular clinic visits, examinations, urine drug screen, pill counts as well as use of [place] Controlled Substance reporting System. 2. Depression: Continue Current Medication Regimen of Prozac, Latuda and Wellbutrin. PCP following. 05/22/2018 3. Muscle Spasm: Continuecurrent medication regimen withTizanidine. 05/22/2018 4. Sacroiliac Joint Dysfunction: Continue to monitor.05/22/2018 5. Opioid Induces Constipation: Continue To Monitor 05/22/2018 6. Cervicalgia/ Cervical Radiculitis: Dr. Yevette Edwards Following: Continuecurrent medication regimen withTopamax. Continue to Monitor: Allergic: Gabapentin, Lyrica and Cymbalta. 05/22/2018 7. Fibromyalgia Syndrome: ContinueTopamax and Continue HEP as Tolerated.Marland Kitchen 05/22/2018 8.BilateralGreater Trochanter Bursitis: Continue to Alternate with Ice and Heat Therapy. 05/22/2018. 9. Bilateral Knee Pain: Continue current medication regimen. Continue to monitor. 05/22/2018.  20 minutes of face to face patient care time was spent during this visit. All questions were encouraged and answered.  F/U in 1 month

## 2018-06-06 ENCOUNTER — Ambulatory Visit: Payer: Medicare Other | Admitting: Urology

## 2018-06-22 ENCOUNTER — Encounter: Payer: Self-pay | Admitting: Registered Nurse

## 2018-06-22 ENCOUNTER — Encounter: Payer: Medicare Other | Attending: Registered Nurse | Admitting: Registered Nurse

## 2018-06-22 VITALS — BP 102/70 | HR 70 | Ht 64.0 in | Wt 160.0 lb

## 2018-06-22 DIAGNOSIS — M62838 Other muscle spasm: Secondary | ICD-10-CM

## 2018-06-22 DIAGNOSIS — F329 Major depressive disorder, single episode, unspecified: Secondary | ICD-10-CM | POA: Diagnosis not present

## 2018-06-22 DIAGNOSIS — Z87891 Personal history of nicotine dependence: Secondary | ICD-10-CM | POA: Insufficient documentation

## 2018-06-22 DIAGNOSIS — E785 Hyperlipidemia, unspecified: Secondary | ICD-10-CM | POA: Insufficient documentation

## 2018-06-22 DIAGNOSIS — M25562 Pain in left knee: Secondary | ICD-10-CM

## 2018-06-22 DIAGNOSIS — M5412 Radiculopathy, cervical region: Secondary | ICD-10-CM | POA: Diagnosis not present

## 2018-06-22 DIAGNOSIS — M542 Cervicalgia: Secondary | ICD-10-CM | POA: Diagnosis not present

## 2018-06-22 DIAGNOSIS — M7062 Trochanteric bursitis, left hip: Secondary | ICD-10-CM

## 2018-06-22 DIAGNOSIS — Z981 Arthrodesis status: Secondary | ICD-10-CM | POA: Insufficient documentation

## 2018-06-22 DIAGNOSIS — M961 Postlaminectomy syndrome, not elsewhere classified: Secondary | ICD-10-CM

## 2018-06-22 DIAGNOSIS — M1990 Unspecified osteoarthritis, unspecified site: Secondary | ICD-10-CM | POA: Insufficient documentation

## 2018-06-22 DIAGNOSIS — G894 Chronic pain syndrome: Secondary | ICD-10-CM

## 2018-06-22 DIAGNOSIS — M2550 Pain in unspecified joint: Secondary | ICD-10-CM

## 2018-06-22 DIAGNOSIS — M5416 Radiculopathy, lumbar region: Secondary | ICD-10-CM | POA: Diagnosis not present

## 2018-06-22 DIAGNOSIS — M7061 Trochanteric bursitis, right hip: Secondary | ICD-10-CM

## 2018-06-22 DIAGNOSIS — M25561 Pain in right knee: Secondary | ICD-10-CM

## 2018-06-22 DIAGNOSIS — G629 Polyneuropathy, unspecified: Secondary | ICD-10-CM | POA: Insufficient documentation

## 2018-06-22 DIAGNOSIS — J45909 Unspecified asthma, uncomplicated: Secondary | ICD-10-CM | POA: Diagnosis not present

## 2018-06-22 DIAGNOSIS — G8929 Other chronic pain: Secondary | ICD-10-CM | POA: Diagnosis present

## 2018-06-22 DIAGNOSIS — Z5181 Encounter for therapeutic drug level monitoring: Secondary | ICD-10-CM

## 2018-06-22 DIAGNOSIS — M797 Fibromyalgia: Secondary | ICD-10-CM | POA: Diagnosis not present

## 2018-06-22 DIAGNOSIS — Z79891 Long term (current) use of opiate analgesic: Secondary | ICD-10-CM

## 2018-06-22 MED ORDER — TRAMADOL HCL 50 MG PO TABS: 50.0000 mg | ORAL_TABLET | Freq: Three times a day (TID) | ORAL | 0 refills | Status: DC | PRN

## 2018-06-22 MED ORDER — MORPHINE SULFATE ER 30 MG PO TBCR: 30.0000 mg | EXTENDED_RELEASE_TABLET | Freq: Three times a day (TID) | ORAL | 0 refills | Status: DC

## 2018-06-22 MED ORDER — TRAMADOL HCL 50 MG PO TABS: 50.0000 mg | ORAL_TABLET | Freq: Two times a day (BID) | ORAL | 0 refills | Status: DC | PRN

## 2018-06-22 NOTE — Progress Notes (Signed)
Subjective:    Patient ID: Cindy Pratt, female    DOB: 20-Dec-1965, 52 y.o.   MRN: [id number]  HPI: [name] is a 52 year old female who returns for follow up appointment for chronic pain and medication refill. She states her pain is located in her neck, mid-lower back pain, bilateral knee pain also reports joint pain all over. She rates her pain 8. Her current exercise regime is walking.   [name] Morphine equivalent is 105.00 MME. Last UDS was Performed on 03/16/2018, it was consistent.   Pain Inventory Average Pain 9 Pain Right Now 8 My pain is all  In the last 24 hours, has pain interfered with the following? General activity 10 Relation with others 10 Enjoyment of life 10 What TIME of day is your pain at its worst? all Sleep (in general) Fair  Pain is worse with: some activites Pain improves with: na Relief from Meds: na  Mobility walk without assistance  Function Do you have any goals in this area?  no  Neuro/Psych weakness numbness tremor tingling spasms dizziness confusion depression anxiety  Prior Studies Any changes since last visit?  no  Physicians involved in your care Any changes since last visit?  no   Family History  Problem Relation Age of Onset  . Hypertension Mother   . Hypertension Father   . Heart attack Father   . Breast cancer Maternal Grandmother   . Liver cancer Maternal Grandmother   . Cancer Maternal Grandmother        breast cancer  . Ovarian cancer Cousin   . Cancer Cousin        1st c. maternal side/ cervical cancer  . Cancer Cousin        1st c maternal/ colon cancer  . Diabetes Maternal Grandfather        both sides   Social History   Socioeconomic History  . Marital status: Married    Spouse name: Cindy Pratt  . Number of children: 4  . Years of education: M  . Highest education level: Not on file  Occupational History  . Occupation: disabled    Employer: DISABLE  Social Needs  . Financial  resource strain: Not on file  . Food insecurity:    Worry: Not on file    Inability: Not on file  . Transportation needs:    Medical: Not on file    Non-medical: Not on file  Tobacco Use  . Smoking status: Former Smoker    Packs/day: 1.00    Years: 20.00    Pack years: 20.00    Types: Cigarettes    Last attempt to quit: 07/31/2007    Years since quitting: 10.9  . Smokeless tobacco: Never Used  Substance and Sexual Activity  . Alcohol use: No  . Drug use: No  . Sexual activity: Yes    Birth control/protection: Surgical  Lifestyle  . Physical activity:    Days per week: Not on file    Minutes per session: Not on file  . Stress: Not on file  Relationships  . Social connections:    Talks on phone: Not on file    Gets together: Not on file    Attends religious service: Not on file    Active member of club or organization: Not on file    Attends meetings of clubs or organizations: Not on file    Relationship status: Not on file  Other Topics Concern  . Not  on file  Social History Narrative   Lives at home with husband Cindy Pratt   Drinks 4 sodas a day   Past Surgical History:  Procedure Laterality Date  . ABDOMINAL HYSTERECTOMY    . BACK SURGERY  1997, 2003  . CYSTECTOMY    . DIAGNOSTIC LAPAROSCOPY    . EXTRACORPOREAL SHOCK WAVE LITHOTRIPSY Right 05/04/2018   Procedure: RIGHT EXTRACORPOREAL SHOCK WAVE LITHOTRIPSY (ESWL) WITH MAC;  Surgeon: Kathie Rhodes, MD;  Location: [facility];  Service: Urology;  Laterality: Right;  . WRIST ARTHROPLASTY Left   . WRIST ARTHROSCOPY WITH DEBRIDEMENT Right 11/13/2015   Procedure: RIGHT WRIST ARTHROSCOPY WITH DEBRIDEMENT;  Surgeon: Leanora Cover, MD;  Location: [facility];  Service: Orthopedics;  Laterality: Right;   Past Medical History:  Diagnosis Date  . Anxiety   . Asthma   . Belchings   . Chronic pain syndrome    in pain clinic  . Degenerative joint disease   . Depression   . Disturbance of skin sensation   . Fibromyalgia    . GERD (gastroesophageal reflux disease)   . History of kidney stones   . Hyperlipidemia   . Lumbar post-laminectomy syndrome   . Lumbosacral neuritis   . Osteoarthritis   . Other chronic postoperative pain   . Sciatica    There were no vitals taken for this visit.  Opioid Risk Score:   Fall Risk Score:  `1  Depression screen PHQ 2/9  Depression screen [facility] 2/9 05/22/2018 03/16/2018 02/03/2018 01/06/2018 11/07/2017 10/10/2017 07/18/2017  Decreased Interest _0 0  Down, Depressed, Hopeless _1 0  PHQ - 2 Score _2 0  Altered sleeping - - - - - - -  Tired, decreased energy - - - - - - -  Change in appetite - - - - - - -  Feeling bad or failure about yourself  - - - - - - -  Trouble concentrating - - - - - - -  Moving slowly or fidgety/restless - - - - - - -  Suicidal thoughts - - - - - - -  PHQ-9 Score - - - - - - -  Some recent data might be hidden     Review of Systems  Constitutional: Negative.   HENT: Negative.   Eyes: Negative.   Respiratory: Negative.   Cardiovascular: Negative.   Gastrointestinal: Negative.   Endocrine: Negative.   Genitourinary: Negative.   Musculoskeletal: Positive for arthralgias and myalgias.  Skin: Negative.   Allergic/Immunologic: Negative.   Neurological: Positive for dizziness, tremors, weakness and numbness.  Hematological: Negative.   Psychiatric/Behavioral: Positive for dysphoric mood. The patient is nervous/anxious.   All other systems reviewed and are negative.      Objective:   Physical Exam  Constitutional: She is oriented to person, place, and time. She appears well-developed and well-nourished.  HENT:  Head: Normocephalic and atraumatic.  Neck: Normal range of motion. Neck supple.  Cardiovascular: Normal rate and regular rhythm.  Pulmonary/Chest: Effort normal and breath sounds normal.  Musculoskeletal:  Normal Muscle Bulk and Muscle Testing Reveals: Upper Extremities: Full ROM and Muscle Strength  5/5 Thoracic and Lumbar Hypersensitivity Bilateral Greater Trochanter Tenderness Lower Extremities: Full ROM and Muscle Strength 5/5 Arises from Table with ease Narrow Based Gait  Neurological: She is alert and oriented to person, place, and time.  Skin: Skin is warm and dry.  Psychiatric: She has a normal  mood and affect. Her behavior is normal.  Nursing note and vitals reviewed.         Assessment & Plan:  1.Lumbar postlaminectomy syndrome status post L5-S1 fusion radiating to LLE: Lumbar Radiculitis: Continuecurrent medication regimen withTopamax. 06/22/2018 Refilled:MS contin 30 mg #90pills--use one pill every 8 hours for pain andTramadol 50 mg BID. #60. We will continue the opioid monitoring program, this consists of regular clinic visits, examinations, urine drug screen, pill counts as well as use of [place] Controlled Substance reporting System. 2. Depression: Continue Current Medication Regimen of Prozac, Latuda and Wellbutrin. PCP following. 06/22/2018 3. Muscle Spasm: Continuecurrent medication regimen withTizanidine. 06/22/2018 4. Sacroiliac Joint Dysfunction: Continue to monitor.06/22/2018 5. Opioid Induces Constipation: Continue To Monitor 06/22/2018 6. Cervicalgia/ Cervical Radiculitis: Dr. Lynann Bologna Following: Continuecurrent medication regimen withTopamax. Continue to Monitor: Allergic: Gabapentin, Lyrica and Cymbalta. 06/22/2018 7. Fibromyalgia Syndrome:ContinueTopamax and Continue HEP as Tolerated.. 06/22/2018 8.BilateralGreater Trochanter Bursitis:Continue to Alternate with Ice and Heat Therapy. 06/22/2018. 9. Bilateral Knee Pain: Continue current medication regimen. Continue to monitor. 06/22/2018. 10. Greater Trochanteric Bursitis of Both Hips: Continue to Alternate Ice and Heat Therapy. Refuses Steroid Injection at this time.   20 minutes of face to face patient care time was spent during this visit. All questions were encouraged and  answered.  F/U in 1 month

## 2018-06-23 ENCOUNTER — Ambulatory Visit: Payer: Medicare Other | Admitting: Registered Nurse

## 2018-07-21 ENCOUNTER — Ambulatory Visit (HOSPITAL_BASED_OUTPATIENT_CLINIC_OR_DEPARTMENT_OTHER): Payer: Medicare Other | Admitting: Physical Medicine & Rehabilitation

## 2018-07-21 ENCOUNTER — Encounter: Payer: Medicare Other | Attending: Registered Nurse

## 2018-07-21 ENCOUNTER — Encounter: Payer: Self-pay | Admitting: Physical Medicine & Rehabilitation

## 2018-07-21 VITALS — BP 107/70 | HR 76 | Ht 64.0 in | Wt 157.0 lb

## 2018-07-21 DIAGNOSIS — E785 Hyperlipidemia, unspecified: Secondary | ICD-10-CM | POA: Insufficient documentation

## 2018-07-21 DIAGNOSIS — Z87891 Personal history of nicotine dependence: Secondary | ICD-10-CM | POA: Diagnosis not present

## 2018-07-21 DIAGNOSIS — J45909 Unspecified asthma, uncomplicated: Secondary | ICD-10-CM | POA: Diagnosis not present

## 2018-07-21 DIAGNOSIS — M5416 Radiculopathy, lumbar region: Secondary | ICD-10-CM | POA: Diagnosis not present

## 2018-07-21 DIAGNOSIS — G8929 Other chronic pain: Secondary | ICD-10-CM | POA: Diagnosis present

## 2018-07-21 DIAGNOSIS — M797 Fibromyalgia: Secondary | ICD-10-CM | POA: Diagnosis not present

## 2018-07-21 DIAGNOSIS — M961 Postlaminectomy syndrome, not elsewhere classified: Secondary | ICD-10-CM

## 2018-07-21 DIAGNOSIS — F329 Major depressive disorder, single episode, unspecified: Secondary | ICD-10-CM | POA: Insufficient documentation

## 2018-07-21 DIAGNOSIS — G629 Polyneuropathy, unspecified: Secondary | ICD-10-CM | POA: Diagnosis not present

## 2018-07-21 DIAGNOSIS — Z981 Arthrodesis status: Secondary | ICD-10-CM | POA: Insufficient documentation

## 2018-07-21 DIAGNOSIS — M1990 Unspecified osteoarthritis, unspecified site: Secondary | ICD-10-CM | POA: Insufficient documentation

## 2018-07-21 MED ORDER — TRAMADOL HCL 50 MG PO TABS: 50.0000 mg | ORAL_TABLET | Freq: Two times a day (BID) | ORAL | 0 refills | Status: DC | PRN

## 2018-07-21 MED ORDER — TOPIRAMATE 25 MG PO TABS: 25.0000 mg | ORAL_TABLET | Freq: Two times a day (BID) | ORAL | 2 refills | Status: DC

## 2018-07-21 MED ORDER — MORPHINE SULFATE ER 30 MG PO TBCR: 30.0000 mg | EXTENDED_RELEASE_TABLET | Freq: Three times a day (TID) | ORAL | 0 refills | Status: DC

## 2018-07-21 NOTE — Progress Notes (Signed)
Subjective:    Patient ID: Cindy Pratt, female    DOB: September 06, 1965, 52 y.o.   MRN: [id number]  HPI 52 year old female with history of L5-S1 fusion in 2003 who has had chronic postoperative pain in the low back area.  She underwent a sacroiliac injection which provided some short-term pain relief performed in 2017.  She has a history of cervicalgia prior history of carpal tunnel syndrome. The patient has not followed up with orthopedic spine surgery in regards to her neck pain.  She has had no progressive symptoms and no upper extremity symptoms.  No falls she remains independent with all self-care and mobility she does not require assistive device.  pain Inventory Average Pain 9 Pain Right Now 9 My pain is constant  In the last 24 hours, has pain interfered with the following? General activity 9 Relation with others 9 Enjoyment of life 9 What TIME of day is your pain at its worst? all Sleep (in general) Good  Pain is worse with: na Pain improves with: medication Relief from Meds: na  Mobility walk without assistance  Function Do you have any goals in this area?  no  Neuro/Psych weakness numbness tremor tingling spasms dizziness confusion depression anxiety  Prior Studies Any changes since last visit?  no  Physicians involved in your care Any changes since last visit?  no   Family History  Problem Relation Age of Onset  . Hypertension Mother   . Hypertension Father   . Heart attack Father   . Breast cancer Maternal Grandmother   . Liver cancer Maternal Grandmother   . Cancer Maternal Grandmother        breast cancer  . Ovarian cancer Cousin   . Cancer Cousin        1st c. maternal side/ cervical cancer  . Cancer Cousin        1st c maternal/ colon cancer  . Diabetes Maternal Grandfather        both sides   Social History   Socioeconomic History  . Marital status: Married    Spouse name: [name]  . Number of children: 4  . Years of education: M    . Highest education level: Not on file  Occupational History  . Occupation: disabled    Employer: DISABLE  Social Needs  . Financial resource strain: Not on file  . Food insecurity:    Worry: Not on file    Inability: Not on file  . Transportation needs:    Medical: Not on file    Non-medical: Not on file  Tobacco Use  . Smoking status: Former Smoker    Packs/day: 1.00    Years: 20.00    Pack years: 20.00    Types: Cigarettes    Last attempt to quit: 07/31/2007    Years since quitting: 10.9  . Smokeless tobacco: Never Used  Substance and Sexual Activity  . Alcohol use: No  . Drug use: No  . Sexual activity: Yes    Birth control/protection: Surgical  Lifestyle  . Physical activity:    Days per week: Not on file    Minutes per session: Not on file  . Stress: Not on file  Relationships  . Social connections:    Talks on phone: Not on file    Gets together: Not on file    Attends religious service: Not on file    Active member of club or organization: Not on file    Attends meetings of clubs or  organizations: Not on file    Relationship status: Not on file  Other Topics Concern  . Not on file  Social History Narrative   Lives at home with husband [name]   Drinks 4 sodas a day   Past Surgical History:  Procedure Laterality Date  . ABDOMINAL HYSTERECTOMY    . BACK SURGERY  1997, 2003  . CYSTECTOMY    . DIAGNOSTIC LAPAROSCOPY    . EXTRACORPOREAL SHOCK WAVE LITHOTRIPSY Right 05/04/2018   Procedure: RIGHT EXTRACORPOREAL SHOCK WAVE LITHOTRIPSY (ESWL) WITH MAC;  Surgeon: Kathie Rhodes, MD;  Location: [facility];  Service: Urology;  Laterality: Right;  . WRIST ARTHROPLASTY Left   . WRIST ARTHROSCOPY WITH DEBRIDEMENT Right 11/13/2015   Procedure: RIGHT WRIST ARTHROSCOPY WITH DEBRIDEMENT;  Surgeon: Leanora Cover, MD;  Location: [place];  Service: Orthopedics;  Laterality: Right;   Past Medical History:  Diagnosis Date  . Anxiety   . Asthma   . Belchings   .  Chronic pain syndrome    in pain clinic  . Degenerative joint disease   . Depression   . Disturbance of skin sensation   . Fibromyalgia   . GERD (gastroesophageal reflux disease)   . History of kidney stones   . Hyperlipidemia   . Lumbar post-laminectomy syndrome   . Lumbosacral neuritis   . Osteoarthritis   . Other chronic postoperative pain   . Sciatica    BP 107/70   Pulse 76   Ht 5' 4"  (1.626 m)   Wt 157 lb (71.2 kg)   SpO2 97%   BMI 26.95 kg/m   Opioid Risk Score:   Fall Risk Score:  `1  Depression screen PHQ 2/9  Depression screen [facility] 2/9 05/22/2018 03/16/2018 02/03/2018 01/06/2018 11/07/2017 10/10/2017 07/18/2017  Decreased Interest 1 3 3 3 3 3  0  Down, Depressed, Hopeless 1 3 3 3 3 3  0  PHQ - 2 Score 2 6 6 6 6 6  0  Altered sleeping - - - - - - -  Tired, decreased energy - - - - - - -  Change in appetite - - - - - - -  Feeling bad or failure about yourself  - - - - - - -  Trouble concentrating - - - - - - -  Moving slowly or fidgety/restless - - - - - - -  Suicidal thoughts - - - - - - -  PHQ-9 Score - - - - - - -  Some recent data might be hidden     Review of Systems  Constitutional: Negative.   HENT: Negative.   Eyes: Negative.   Respiratory: Negative.   Cardiovascular: Negative.   Gastrointestinal: Negative.   Endocrine: Negative.   Genitourinary: Negative.   Musculoskeletal: Positive for arthralgias, back pain and myalgias.  Skin: Negative.   Allergic/Immunologic: Negative.   Neurological: Positive for dizziness, tremors, weakness and numbness.  Hematological: Negative.   Psychiatric/Behavioral: Positive for confusion and dysphoric mood. The patient is nervous/anxious.   All other systems reviewed and are negative.      Objective:   Physical Exam  Constitutional: She is oriented to person, place, and time. She appears well-developed and well-nourished. No distress.  HENT:  Head: Normocephalic and atraumatic.  Eyes: Pupils are equal, round, and  reactive to light. EOM are normal.  Musculoskeletal:  Patient with normal range of motion bilateral hips.  Normal knee and ankle range of motion. Bar spine range of motion is reduced to 50% flexion extension  lateral bending and rotation. Ambulates without assistive device no evidence of toe drag or knee instability  Neurological: She is alert and oriented to person, place, and time. Coordination and gait normal.  Motor strength is 5/5 bilateral hip flexor knee extensor ankle reflex Motor strength is 5/5 bilateral deltoid bicep tricep grip  Skin: Skin is warm and dry. She is not diaphoretic.  Psychiatric: She has a normal mood and affect.  Nursing note and vitals reviewed.  Tenderness to palpation right greater than left PSIS area       Assessment & Plan:  1.  Lumbar postlaminectomy syndrome, pain is overall well controlled when she is not very active.  She does have some activity related pain.  We discussed that some of her pain is related to sacroiliac dysfunction which is common after lumbar fusion.  We also discussed that lateral branch blocks are now a more common treatment and radiofrequency of the same nerves can provide a long-lasting result than intra-articular sacroiliac injection. Patient will think about this. She will continue current medications Urine drug screen 03/16/2018 reviewed and appropriate [place] controlled substance reporting system reviewed and appropriate Patient does have a controlled substance agreement with this clinic Nurse practitioner visit 1 month Encourage exercise

## 2018-07-25 ENCOUNTER — Other Ambulatory Visit: Payer: Self-pay

## 2018-07-25 ENCOUNTER — Ambulatory Visit (INDEPENDENT_AMBULATORY_CARE_PROVIDER_SITE_OTHER): Payer: Medicare Other | Admitting: Psychiatry

## 2018-07-25 ENCOUNTER — Encounter: Payer: Self-pay | Admitting: Psychiatry

## 2018-07-25 VITALS — BP 116/76 | HR 71 | Temp 98.1°F | Wt 158.4 lb

## 2018-07-25 DIAGNOSIS — F4312 Post-traumatic stress disorder, chronic: Secondary | ICD-10-CM | POA: Diagnosis not present

## 2018-07-25 DIAGNOSIS — F3162 Bipolar disorder, current episode mixed, moderate: Secondary | ICD-10-CM

## 2018-07-25 MED ORDER — CARIPRAZINE HCL 1.5 MG PO CAPS: 1.5000 mg | ORAL_CAPSULE | Freq: Every day | ORAL | 1 refills | Status: DC

## 2018-07-25 NOTE — Patient Instructions (Signed)
Cariprazine oral capsules What is this medicine? CARIPRAZINE (car i PRA zeen) is an antipsychotic. It is used to treat schizophrenia or bipolar disorder. Bipolar disorder is also known as manic-depression. This medicine may be used for other purposes; ask your health care provider or pharmacist if you have questions. COMMON BRAND NAME(S): VRAYLAR What should I tell my health care provider before I take this medicine? They need to know if you have any of these conditions: -dementia -diabetes -difficulty swallowing -heart disease -history of breast cancer -kidney disease -liver disease -low blood counts, like low white cell, platelet, or red cell counts -low blood pressure -Parkinson's disease -seizures -suicidal thoughts, plans, or attempt; a previous suicide attempt by you or a family member -an unusual or allergic reaction to cariprazine, other medicines, foods, dyes, or preservatives -pregnant or trying to get pregnant -breast-feeding How should I use this medicine? Take this medicine by mouth with a glass of water. Follow the directions on the prescription label. You may take it with or without food. Take your medicine at regular intervals. Do not take it more often than directed. Do not stop taking except on your doctor's advice. Talk to your pediatrician regarding the use of this medicine in children. Special care may be needed. Overdosage: If you think you have taken too much of this medicine contact a poison control center or emergency room at once. NOTE: This medicine is only for you. Do not share this medicine with others. What if I miss a dose? If you miss a dose, take it as soon as you can. If it is almost time for your next dose, take only that dose. Do not take double or extra doses. What may interact with this medicine? Do not take this medicine with any of the following medications: -metoclopramide This medicine may also interact with the following  medications: -carbamazepine -certain medicines for depression, anxiety, or psychotic disturbances -certain medicines for sleep -itraconazole -ketoconazole -medicines for blood pressure -rifampin -St John's wort This list may not describe all possible interactions. Give your health care provider a list of all the medicines, herbs, non-prescription drugs, or dietary supplements you use. Also tell them if you smoke, drink alcohol, or use illegal drugs. Some items may interact with your medicine. What should I watch for while using this medicine? Visit your doctor or health care professional for regular checks on your progress. It may be several weeks before you see the full effects of this medicine. Notify your doctor or health care professional if your symptoms get worse, if you have new symptoms, if you are having an unusual effect from this medicine, or if you feel out of control, very discouraged or think you might harm yourself or others. Do not suddenly stop taking this medicine. You may need to gradually reduce the dose. Ask your doctor or health care professional for advice. You may get dizzy or drowsy. Do not drive, use machinery, or do anything that needs mental alertness until you know how this medicine affects you. Do not stand or sit up quickly, especially if you are an older patient. This reduces the risk of dizzy or fainting spells. Alcohol can increase dizziness and drowsiness. Avoid alcoholic drinks. This medicine may cause dry eyes and blurred vision. If you wear contact lenses you may feel some discomfort. Lubricating drops may help. See your eye doctor if the problem does not go away or is severe. This medicine can reduce the response of your body to heat or cold. Dress   warm in cold weather and stay hydrated in hot weather. If possible, avoid extreme temperatures like saunas, hot tubs, very hot or cold showers, or activities that can cause dehydration such as vigorous exercise. Women  should inform their doctor if they wish to become pregnant or think they might be pregnant. The effects of this medicine on an unborn child are not known. A registry is available to monitor pregnancy outcomes in pregnant women exposed to this medicine or similar medicines. Talk to your health care professional or pharmacist for more information. What side effects may I notice from receiving this medicine? Side effects that you should report to your doctor or health care professional as soon as possible: -allergic reactions like skin rash, itching or hives, swelling of the face, lips, or tongue -changes in emotions or moods -confusion -difficulty swallowing -feeling faint or lightheaded, falls -fever or chills, sore throat -inability to control muscle movements in the face, mouth, hands, arms, or legs -increased hunger or thirst -increased urination -missed or irregular menstrual periods -problems with balance, talking, walking -redness, blistering, peeling or loosening of the skin, including inside the mouth -seizures -stiff muscles -suicidal thoughts or other mood changes -unusually weak or tired Side effects that usually do not require medical attention (report to your doctor or health care professional if they continue or are bothersome): -constipation -dizziness -drowsiness -nausea, vomiting -restlessness -upset stomach -weight gain This list may not describe all possible side effects. Call your doctor for medical advice about side effects. You may report side effects to FDA at 1-800-FDA-1088. Where should I keep my medicine? Keep out of the reach of children. Store at room temperature between 15 and 30 degrees C (59 and 86 degrees F). Protect from light. Throw away any unused medicine after the expiration date. NOTE: This sheet is a summary. It may not cover all possible information. If you have questions about this medicine, talk to your doctor, pharmacist, or health care  provider.  2018 Elsevier/Gold Standard (2016-07-16 15:40:17)  
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
## 2018-07-26 ENCOUNTER — Encounter: Payer: Self-pay | Admitting: Psychiatry

## 2018-07-31 ENCOUNTER — Telehealth: Payer: Self-pay

## 2018-07-31 NOTE — Telephone Encounter (Signed)
faxed and confirmed approval notice to the pharmacy

## 2018-07-31 NOTE — Telephone Encounter (Signed)
prior authorization was submitted on covermymeds. - pending review.

## 2018-07-31 NOTE — Telephone Encounter (Signed)
received fax that vraylar cap 1.5mg  was already approved [place] [id number] from 07-31-18 to 08-29-18

## 2018-07-31 NOTE — Telephone Encounter (Signed)
received a fax that a prior authorization needed to be done on the vraylar 1.5mg 

## 2018-08-04 ENCOUNTER — Ambulatory Visit: Payer: Medicare Other | Admitting: Psychiatry

## 2018-08-11 ENCOUNTER — Ambulatory Visit: Payer: Medicare Other | Admitting: Psychiatry

## 2018-08-18 ENCOUNTER — Encounter: Payer: Medicare Other | Attending: Registered Nurse | Admitting: Registered Nurse

## 2018-08-18 VITALS — BP 108/73 | HR 67 | Ht 64.0 in | Wt 158.0 lb

## 2018-08-18 DIAGNOSIS — G629 Polyneuropathy, unspecified: Secondary | ICD-10-CM | POA: Insufficient documentation

## 2018-08-18 DIAGNOSIS — M62838 Other muscle spasm: Secondary | ICD-10-CM

## 2018-08-18 DIAGNOSIS — J45909 Unspecified asthma, uncomplicated: Secondary | ICD-10-CM | POA: Diagnosis not present

## 2018-08-18 DIAGNOSIS — M797 Fibromyalgia: Secondary | ICD-10-CM | POA: Diagnosis not present

## 2018-08-18 DIAGNOSIS — M961 Postlaminectomy syndrome, not elsewhere classified: Secondary | ICD-10-CM | POA: Diagnosis not present

## 2018-08-18 DIAGNOSIS — F329 Major depressive disorder, single episode, unspecified: Secondary | ICD-10-CM | POA: Insufficient documentation

## 2018-08-18 DIAGNOSIS — Z79891 Long term (current) use of opiate analgesic: Secondary | ICD-10-CM

## 2018-08-18 DIAGNOSIS — M5412 Radiculopathy, cervical region: Secondary | ICD-10-CM | POA: Diagnosis not present

## 2018-08-18 DIAGNOSIS — Z981 Arthrodesis status: Secondary | ICD-10-CM | POA: Insufficient documentation

## 2018-08-18 DIAGNOSIS — G8929 Other chronic pain: Secondary | ICD-10-CM | POA: Diagnosis present

## 2018-08-18 DIAGNOSIS — Z87891 Personal history of nicotine dependence: Secondary | ICD-10-CM | POA: Insufficient documentation

## 2018-08-18 DIAGNOSIS — E785 Hyperlipidemia, unspecified: Secondary | ICD-10-CM | POA: Insufficient documentation

## 2018-08-18 DIAGNOSIS — Z5181 Encounter for therapeutic drug level monitoring: Secondary | ICD-10-CM

## 2018-08-18 DIAGNOSIS — M542 Cervicalgia: Secondary | ICD-10-CM | POA: Diagnosis not present

## 2018-08-18 DIAGNOSIS — G894 Chronic pain syndrome: Secondary | ICD-10-CM

## 2018-08-18 DIAGNOSIS — M1990 Unspecified osteoarthritis, unspecified site: Secondary | ICD-10-CM | POA: Diagnosis not present

## 2018-08-18 DIAGNOSIS — M5416 Radiculopathy, lumbar region: Secondary | ICD-10-CM

## 2018-08-18 MED ORDER — TRAMADOL HCL 50 MG PO TABS: 50.0000 mg | ORAL_TABLET | Freq: Two times a day (BID) | ORAL | 2 refills | Status: DC | PRN

## 2018-08-18 MED ORDER — MORPHINE SULFATE ER 30 MG PO TBCR: 30.0000 mg | EXTENDED_RELEASE_TABLET | Freq: Three times a day (TID) | ORAL | 0 refills | Status: DC
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
## 2018-08-25 ENCOUNTER — Encounter: Payer: Self-pay | Admitting: Registered Nurse

## 2018-08-31 ENCOUNTER — Ambulatory Visit: Payer: Medicare Other | Admitting: Psychiatry

## 2018-09-19 ENCOUNTER — Encounter: Payer: Medicare Other | Attending: Registered Nurse

## 2018-09-19 ENCOUNTER — Ambulatory Visit (HOSPITAL_BASED_OUTPATIENT_CLINIC_OR_DEPARTMENT_OTHER): Payer: Medicare Other | Admitting: Physical Medicine & Rehabilitation

## 2018-09-19 DIAGNOSIS — M533 Sacrococcygeal disorders, not elsewhere classified: Secondary | ICD-10-CM

## 2018-09-19 DIAGNOSIS — M797 Fibromyalgia: Secondary | ICD-10-CM | POA: Insufficient documentation

## 2018-09-19 DIAGNOSIS — G629 Polyneuropathy, unspecified: Secondary | ICD-10-CM | POA: Diagnosis not present

## 2018-09-19 DIAGNOSIS — J45909 Unspecified asthma, uncomplicated: Secondary | ICD-10-CM | POA: Insufficient documentation

## 2018-09-19 DIAGNOSIS — F329 Major depressive disorder, single episode, unspecified: Secondary | ICD-10-CM | POA: Diagnosis not present

## 2018-09-19 DIAGNOSIS — E785 Hyperlipidemia, unspecified: Secondary | ICD-10-CM | POA: Insufficient documentation

## 2018-09-19 DIAGNOSIS — M961 Postlaminectomy syndrome, not elsewhere classified: Secondary | ICD-10-CM | POA: Diagnosis not present

## 2018-09-19 DIAGNOSIS — Z981 Arthrodesis status: Secondary | ICD-10-CM | POA: Diagnosis not present

## 2018-09-19 DIAGNOSIS — Z87891 Personal history of nicotine dependence: Secondary | ICD-10-CM | POA: Diagnosis not present

## 2018-09-19 DIAGNOSIS — G8929 Other chronic pain: Secondary | ICD-10-CM | POA: Diagnosis present

## 2018-09-19 DIAGNOSIS — M1990 Unspecified osteoarthritis, unspecified site: Secondary | ICD-10-CM | POA: Insufficient documentation

## 2018-09-19 MED ORDER — MORPHINE SULFATE ER 30 MG PO TBCR: 30.0000 mg | EXTENDED_RELEASE_TABLET | Freq: Three times a day (TID) | ORAL | 0 refills | Status: DC

## 2018-09-19 NOTE — Progress Notes (Signed)
  PROCEDURE RECORD [facility]   Name: NIKEA WOLTER DOB:02-12-1966 MRN: [id number]  Date:09/19/2018  Physician: Claudette Laws, MD    Nurse/CMA: Wessling, CMA  Allergies:  Allergies  Allergen Reactions  . Aspirin Shortness Of Breath  . Nsaids Shortness Of Breath  . Sulfa Antibiotics Hives  . Sulfonamide Derivatives Hives  . Tolmetin Shortness Of Breath  . Cymbalta [Duloxetine Hcl] Other (See Comments)    confusion  . Duloxetine Other (See Comments)    confusion  . Gabapentin Other (See Comments)    Causes confusion  . Other Other (See Comments)    Aswanghda? Patient is not sure of spelling it is an herb- caused confusion  . Pregabalin Other (See Comments)    confusion    Consent Signed: Yes.    Is patient diabetic? No.  CBG today?   Pregnant: No. LMP: No LMP recorded. Patient has had a hysterectomy. (age 45-55)  Anticoagulants: no Anti-inflammatory: no Antibiotics: no  Procedure: Sacroiliac injection Position: Prone Start Time: 2:22pm       End Time: 2:26pm         Fluoro Time: 13s  RN/CMA Nedra Hai, CMA Wessling, CMA    Time 1:30pm 2:30pm    BP 105/71 121/81    Pulse 66 64    Respirations 16 16    O2 Sat 98 99    S/S 6 6    Pain Level 7/10 0/10     D/C home with husband, patient A & O X 3, D/C instructions reviewed, and sits independently.

## 2018-09-19 NOTE — Progress Notes (Addendum)
Left sacroiliac injection under fluoroscopic guidance  Indication: Left Low back and buttocks pain not relieved by medication management and other conservative care.  Informed consent was obtained after describing risks and benefits of the procedure with the patient, this includes bleeding, bruising, infection, paralysis and medication side effects. The patient wishes to proceed and has given written consent. The patient was placed in a prone position. The lumbar and sacral area was marked and prepped with Betadine. A 25-gauge 1-1/2 inch needle was inserted into the skin and subcutaneous tissue and 1 mL of 1% lidocaine was injected. Then a 25-gauge 3 inch spinal needle was inserted under fluoroscopic guidance into the left sacroiliac joint. AP and lateral images were utilized. Isovue 200x0.5 mL under live fluoroscopy demonstrated no intravascular uptake. Then a solution containing one ML of 6 mg per mLbetamethasone and 2 ML of 2% lidocaine MPF was injected x1.5 mL. Patient tolerated the procedure well. Post procedure instructions were given. Please see post procedure form.  Pre injection pain 7/10 Post injection pain 0/10

## 2018-09-20 DIAGNOSIS — R0981 Nasal congestion: Secondary | ICD-10-CM | POA: Insufficient documentation

## 2018-09-21 ENCOUNTER — Encounter: Payer: Self-pay | Admitting: Psychiatry

## 2018-09-21 ENCOUNTER — Other Ambulatory Visit: Payer: Self-pay

## 2018-09-21 ENCOUNTER — Ambulatory Visit (INDEPENDENT_AMBULATORY_CARE_PROVIDER_SITE_OTHER): Payer: Medicare Other | Admitting: Psychiatry

## 2018-09-21 VITALS — BP 113/74 | HR 75 | Temp 98.3°F | Wt 158.2 lb

## 2018-09-21 DIAGNOSIS — F3162 Bipolar disorder, current episode mixed, moderate: Secondary | ICD-10-CM

## 2018-09-21 DIAGNOSIS — F4312 Post-traumatic stress disorder, chronic: Secondary | ICD-10-CM

## 2018-09-21 DIAGNOSIS — Z9189 Other specified personal risk factors, not elsewhere classified: Secondary | ICD-10-CM | POA: Diagnosis not present
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
## 2018-09-22 ENCOUNTER — Telehealth: Payer: Self-pay | Admitting: Psychiatry

## 2018-09-22 NOTE — Telephone Encounter (Signed)
Patient reported having occasional racing HR on and off during her visit yesterday. Hence ordered EKG . EKG reviewed - patient with nonspecific T wave abnormality and sinus bradycardia, will forward to her PMD to follow up for further management if needed.

## 2018-09-25 ENCOUNTER — Other Ambulatory Visit: Payer: Self-pay | Admitting: Psychiatry

## 2018-09-26 LAB — TSH: TSH: 1.18 u[IU]/mL (ref 0.450–4.500)

## 2018-09-26 LAB — LIPID PANEL W/O CHOL/HDL RATIO
Cholesterol, Total: 200 mg/dL — ABNORMAL HIGH (ref 100–199)
HDL: 56 mg/dL (ref >39)
LDL Calculated: 116 mg/dL — ABNORMAL HIGH (ref 0–99)
Triglycerides: 139 mg/dL (ref 0–149)
VLDL Cholesterol Cal: 28 mg/dL (ref 5–40)

## 2018-09-26 LAB — PROLACTIN: PROLACTIN: 20.4 ng/mL — AB (ref 4.0–15.2)

## 2018-09-26 LAB — HGB A1C W/O EAG: Hgb A1c MFr Bld: 5.4 % (ref 4.8–5.6)

## 2018-09-26 LAB — VITAMIN B12: Vitamin B-12: 819 pg/mL (ref 232–1245)

## 2018-10-02 ENCOUNTER — Telehealth: Payer: Self-pay

## 2018-10-02 NOTE — Telephone Encounter (Signed)
pt called left a message that she needs refills on her mediations prozac, wellbutrin , vraylar,

## 2018-10-03 ENCOUNTER — Telehealth: Payer: Self-pay | Admitting: Psychiatry

## 2018-10-03 MED ORDER — BUPROPION HCL ER (SR) 150 MG PO TB12: 150.0000 mg | ORAL_TABLET | Freq: Two times a day (BID) | ORAL | 1 refills | Status: DC

## 2018-10-03 MED ORDER — CARIPRAZINE HCL 1.5 MG PO CAPS: 1.5000 mg | ORAL_CAPSULE | Freq: Every day | ORAL | 1 refills | Status: DC

## 2018-10-03 MED ORDER — FLUOXETINE HCL 60 MG PO TABS: 60.0000 mg | ORAL_TABLET | Freq: Every day | ORAL | 1 refills | Status: DC

## 2018-10-03 NOTE — Telephone Encounter (Signed)
error 

## 2018-10-03 NOTE — Telephone Encounter (Signed)
Sent refills for vraylar, prozac, wellbutrin to pharmacy.

## 2018-10-12 ENCOUNTER — Ambulatory Visit: Payer: Medicare Other | Admitting: Psychiatry

## 2018-10-17 ENCOUNTER — Encounter: Payer: Medicare Other | Attending: Registered Nurse | Admitting: Registered Nurse

## 2018-10-17 ENCOUNTER — Encounter: Payer: Self-pay | Admitting: Registered Nurse

## 2018-10-17 VITALS — BP 98/67 | HR 68 | Resp 14 | Ht 64.0 in | Wt 162.0 lb

## 2018-10-17 DIAGNOSIS — M7061 Trochanteric bursitis, right hip: Secondary | ICD-10-CM

## 2018-10-17 DIAGNOSIS — F329 Major depressive disorder, single episode, unspecified: Secondary | ICD-10-CM | POA: Insufficient documentation

## 2018-10-17 DIAGNOSIS — E785 Hyperlipidemia, unspecified: Secondary | ICD-10-CM | POA: Diagnosis not present

## 2018-10-17 DIAGNOSIS — Z5181 Encounter for therapeutic drug level monitoring: Secondary | ICD-10-CM

## 2018-10-17 DIAGNOSIS — Z79891 Long term (current) use of opiate analgesic: Secondary | ICD-10-CM

## 2018-10-17 DIAGNOSIS — Z87891 Personal history of nicotine dependence: Secondary | ICD-10-CM | POA: Diagnosis not present

## 2018-10-17 DIAGNOSIS — M797 Fibromyalgia: Secondary | ICD-10-CM | POA: Diagnosis not present

## 2018-10-17 DIAGNOSIS — Z981 Arthrodesis status: Secondary | ICD-10-CM | POA: Diagnosis not present

## 2018-10-17 DIAGNOSIS — M1990 Unspecified osteoarthritis, unspecified site: Secondary | ICD-10-CM | POA: Diagnosis not present

## 2018-10-17 DIAGNOSIS — M5416 Radiculopathy, lumbar region: Secondary | ICD-10-CM

## 2018-10-17 DIAGNOSIS — M961 Postlaminectomy syndrome, not elsewhere classified: Secondary | ICD-10-CM | POA: Diagnosis not present

## 2018-10-17 DIAGNOSIS — M7062 Trochanteric bursitis, left hip: Secondary | ICD-10-CM

## 2018-10-17 DIAGNOSIS — G894 Chronic pain syndrome: Secondary | ICD-10-CM | POA: Diagnosis not present

## 2018-10-17 DIAGNOSIS — G8929 Other chronic pain: Secondary | ICD-10-CM | POA: Diagnosis present

## 2018-10-17 DIAGNOSIS — M542 Cervicalgia: Secondary | ICD-10-CM

## 2018-10-17 DIAGNOSIS — G629 Polyneuropathy, unspecified: Secondary | ICD-10-CM | POA: Insufficient documentation

## 2018-10-17 DIAGNOSIS — J45909 Unspecified asthma, uncomplicated: Secondary | ICD-10-CM | POA: Insufficient documentation

## 2018-10-17 DIAGNOSIS — M62838 Other muscle spasm: Secondary | ICD-10-CM

## 2018-10-17 DIAGNOSIS — M5412 Radiculopathy, cervical region: Secondary | ICD-10-CM

## 2018-10-17 MED ORDER — TIZANIDINE HCL 4 MG PO TABS: 4.0000 mg | ORAL_TABLET | Freq: Two times a day (BID) | ORAL | 5 refills | Status: DC

## 2018-10-17 MED ORDER — TOPIRAMATE 25 MG PO TABS: 25.0000 mg | ORAL_TABLET | Freq: Two times a day (BID) | ORAL | 2 refills | Status: DC

## 2018-10-17 MED ORDER — MORPHINE SULFATE ER 30 MG PO TBCR: 30.0000 mg | EXTENDED_RELEASE_TABLET | Freq: Three times a day (TID) | ORAL | 0 refills | Status: DC
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
## 2018-10-21 LAB — DRUG TOX MONITOR 1 W/CONF, ORAL FLD
AMPHETAMINES: NEGATIVE ng/mL (ref <10)
BENZODIAZEPINES: NEGATIVE ng/mL (ref <0.50)
Barbiturates: NEGATIVE ng/mL (ref <10)
Buprenorphine: NEGATIVE ng/mL (ref <0.10)
COCAINE: NEGATIVE ng/mL (ref <5.0)
Codeine: NEGATIVE ng/mL (ref <2.5)
Dihydrocodeine: NEGATIVE ng/mL (ref <2.5)
Fentanyl: NEGATIVE ng/mL (ref <0.10)
Heroin Metabolite: NEGATIVE ng/mL (ref <1.0)
Hydrocodone: NEGATIVE ng/mL (ref <2.5)
Hydromorphone: NEGATIVE ng/mL (ref <2.5)
MARIJUANA: NEGATIVE ng/mL (ref <2.5)
MDMA: NEGATIVE ng/mL (ref <10)
Meprobamate: NEGATIVE ng/mL (ref <2.5)
Methadone: NEGATIVE ng/mL (ref <5.0)
Morphine: 10.1 ng/mL — ABNORMAL HIGH (ref <2.5)
Nicotine Metabolite: NEGATIVE ng/mL (ref <5.0)
Norhydrocodone: NEGATIVE ng/mL (ref <2.5)
Noroxycodone: NEGATIVE ng/mL (ref <2.5)
Opiates: POSITIVE ng/mL — AB (ref <2.5)
Oxycodone: NEGATIVE ng/mL (ref <2.5)
Oxymorphone: NEGATIVE ng/mL (ref <2.5)
Phencyclidine: NEGATIVE ng/mL (ref <10)
Tapentadol: NEGATIVE ng/mL (ref <5.0)
Tramadol: >500 ng/mL — ABNORMAL HIGH (ref <5.0)
Tramadol: POSITIVE ng/mL — AB (ref <5.0)
Zolpidem: NEGATIVE ng/mL (ref <5.0)

## 2018-10-21 LAB — DRUG TOX ALC METAB W/CON, ORAL FLD: Alcohol Metabolite: NEGATIVE ng/mL (ref <25)

## 2018-10-23 ENCOUNTER — Telehealth: Payer: Self-pay | Admitting: *Deleted

## 2018-10-23 NOTE — Telephone Encounter (Signed)
Oral swab drug screen was consistent for prescribed medications.  ?

## 2018-11-15 ENCOUNTER — Other Ambulatory Visit: Payer: Self-pay | Admitting: *Deleted

## 2018-11-15 MED ORDER — TRAMADOL HCL 50 MG PO TABS: 50.0000 mg | ORAL_TABLET | Freq: Two times a day (BID) | ORAL | 2 refills | Status: DC | PRN

## 2018-11-22 ENCOUNTER — Other Ambulatory Visit: Payer: Self-pay

## 2018-11-22 ENCOUNTER — Encounter: Payer: Medicare Other | Attending: Registered Nurse | Admitting: Registered Nurse

## 2018-11-22 ENCOUNTER — Encounter: Payer: Medicare Other | Admitting: Registered Nurse

## 2018-11-22 VITALS — Ht 64.0 in | Wt 162.0 lb

## 2018-11-22 DIAGNOSIS — Z981 Arthrodesis status: Secondary | ICD-10-CM | POA: Insufficient documentation

## 2018-11-22 DIAGNOSIS — M797 Fibromyalgia: Secondary | ICD-10-CM | POA: Insufficient documentation

## 2018-11-22 DIAGNOSIS — M5416 Radiculopathy, lumbar region: Secondary | ICD-10-CM

## 2018-11-22 DIAGNOSIS — Z79891 Long term (current) use of opiate analgesic: Secondary | ICD-10-CM

## 2018-11-22 DIAGNOSIS — M961 Postlaminectomy syndrome, not elsewhere classified: Secondary | ICD-10-CM | POA: Insufficient documentation

## 2018-11-22 DIAGNOSIS — M5412 Radiculopathy, cervical region: Secondary | ICD-10-CM

## 2018-11-22 DIAGNOSIS — M542 Cervicalgia: Secondary | ICD-10-CM | POA: Diagnosis not present

## 2018-11-22 DIAGNOSIS — F329 Major depressive disorder, single episode, unspecified: Secondary | ICD-10-CM | POA: Insufficient documentation

## 2018-11-22 DIAGNOSIS — M62838 Other muscle spasm: Secondary | ICD-10-CM

## 2018-11-22 DIAGNOSIS — M1990 Unspecified osteoarthritis, unspecified site: Secondary | ICD-10-CM | POA: Insufficient documentation

## 2018-11-22 DIAGNOSIS — G894 Chronic pain syndrome: Secondary | ICD-10-CM

## 2018-11-22 DIAGNOSIS — Z5181 Encounter for therapeutic drug level monitoring: Secondary | ICD-10-CM

## 2018-11-22 DIAGNOSIS — J45909 Unspecified asthma, uncomplicated: Secondary | ICD-10-CM | POA: Insufficient documentation

## 2018-11-22 DIAGNOSIS — E785 Hyperlipidemia, unspecified: Secondary | ICD-10-CM | POA: Insufficient documentation

## 2018-11-22 DIAGNOSIS — G629 Polyneuropathy, unspecified: Secondary | ICD-10-CM | POA: Insufficient documentation

## 2018-11-22 DIAGNOSIS — Z87891 Personal history of nicotine dependence: Secondary | ICD-10-CM | POA: Insufficient documentation

## 2018-11-22 MED ORDER — MORPHINE SULFATE ER 30 MG PO TBCR: 30.0000 mg | EXTENDED_RELEASE_TABLET | Freq: Three times a day (TID) | ORAL | 0 refills | Status: DC
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
## 2018-11-23 ENCOUNTER — Encounter: Payer: Self-pay | Admitting: Registered Nurse

## 2018-12-08 ENCOUNTER — Telehealth: Payer: Self-pay

## 2018-12-08 DIAGNOSIS — F3162 Bipolar disorder, current episode mixed, moderate: Secondary | ICD-10-CM

## 2018-12-08 DIAGNOSIS — F4310 Post-traumatic stress disorder, unspecified: Secondary | ICD-10-CM

## 2018-12-08 NOTE — Telephone Encounter (Signed)
pt called states she needs enough medication to get to her next appt. please send her wellbutrin sr and vraylar fluoxetine    Pt next appt is  12-28-18.0

## 2018-12-11 MED ORDER — CARIPRAZINE HCL 1.5 MG PO CAPS: 1.5000 mg | ORAL_CAPSULE | Freq: Every day | ORAL | 3 refills | Status: DC

## 2018-12-11 MED ORDER — FLUOXETINE HCL 60 MG PO TABS: 60.0000 mg | ORAL_TABLET | Freq: Every day | ORAL | 3 refills | Status: DC

## 2018-12-11 MED ORDER — BUPROPION HCL ER (SR) 150 MG PO TB12: 150.0000 mg | ORAL_TABLET | Freq: Two times a day (BID) | ORAL | 3 refills | Status: DC

## 2018-12-11 NOTE — Telephone Encounter (Signed)
Sent meds to pharmacy  

## 2018-12-18 ENCOUNTER — Other Ambulatory Visit: Payer: Self-pay

## 2018-12-18 ENCOUNTER — Encounter: Payer: Self-pay | Admitting: Registered Nurse

## 2018-12-18 ENCOUNTER — Encounter: Payer: Medicare Other | Attending: Registered Nurse | Admitting: Registered Nurse

## 2018-12-18 VITALS — Ht 64.0 in | Wt 162.0 lb

## 2018-12-18 DIAGNOSIS — M2550 Pain in unspecified joint: Secondary | ICD-10-CM

## 2018-12-18 DIAGNOSIS — F329 Major depressive disorder, single episode, unspecified: Secondary | ICD-10-CM | POA: Insufficient documentation

## 2018-12-18 DIAGNOSIS — G894 Chronic pain syndrome: Secondary | ICD-10-CM

## 2018-12-18 DIAGNOSIS — Z87891 Personal history of nicotine dependence: Secondary | ICD-10-CM | POA: Insufficient documentation

## 2018-12-18 DIAGNOSIS — M542 Cervicalgia: Secondary | ICD-10-CM

## 2018-12-18 DIAGNOSIS — M7061 Trochanteric bursitis, right hip: Secondary | ICD-10-CM

## 2018-12-18 DIAGNOSIS — M797 Fibromyalgia: Secondary | ICD-10-CM | POA: Insufficient documentation

## 2018-12-18 DIAGNOSIS — M545 Low back pain: Secondary | ICD-10-CM | POA: Diagnosis not present

## 2018-12-18 DIAGNOSIS — M961 Postlaminectomy syndrome, not elsewhere classified: Secondary | ICD-10-CM | POA: Diagnosis not present

## 2018-12-18 DIAGNOSIS — M7062 Trochanteric bursitis, left hip: Secondary | ICD-10-CM

## 2018-12-18 DIAGNOSIS — M62838 Other muscle spasm: Secondary | ICD-10-CM

## 2018-12-18 DIAGNOSIS — Z79891 Long term (current) use of opiate analgesic: Secondary | ICD-10-CM

## 2018-12-18 DIAGNOSIS — J45909 Unspecified asthma, uncomplicated: Secondary | ICD-10-CM | POA: Insufficient documentation

## 2018-12-18 DIAGNOSIS — M1990 Unspecified osteoarthritis, unspecified site: Secondary | ICD-10-CM | POA: Insufficient documentation

## 2018-12-18 DIAGNOSIS — E785 Hyperlipidemia, unspecified: Secondary | ICD-10-CM | POA: Insufficient documentation

## 2018-12-18 DIAGNOSIS — G8929 Other chronic pain: Secondary | ICD-10-CM

## 2018-12-18 DIAGNOSIS — G629 Polyneuropathy, unspecified: Secondary | ICD-10-CM | POA: Insufficient documentation

## 2018-12-18 DIAGNOSIS — Z5181 Encounter for therapeutic drug level monitoring: Secondary | ICD-10-CM

## 2018-12-18 DIAGNOSIS — Z981 Arthrodesis status: Secondary | ICD-10-CM | POA: Insufficient documentation

## 2018-12-18 MED ORDER — MORPHINE SULFATE ER 30 MG PO TBCR: 30.0000 mg | EXTENDED_RELEASE_TABLET | Freq: Three times a day (TID) | ORAL | 0 refills | Status: DC

## 2018-12-18 MED ORDER — TOPIRAMATE 50 MG PO TABS: 50.0000 mg | ORAL_TABLET | Freq: Two times a day (BID) | ORAL | 3 refills | Status: DC
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
## 2018-12-28 ENCOUNTER — Other Ambulatory Visit: Payer: Self-pay

## 2018-12-28 ENCOUNTER — Ambulatory Visit (INDEPENDENT_AMBULATORY_CARE_PROVIDER_SITE_OTHER): Payer: Medicare Other | Admitting: Psychiatry

## 2018-12-28 ENCOUNTER — Encounter: Payer: Self-pay | Admitting: Psychiatry

## 2018-12-28 DIAGNOSIS — F4310 Post-traumatic stress disorder, unspecified: Secondary | ICD-10-CM | POA: Diagnosis not present

## 2018-12-28 DIAGNOSIS — F3162 Bipolar disorder, current episode mixed, moderate: Secondary | ICD-10-CM
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
## 2019-01-17 ENCOUNTER — Encounter: Payer: Medicare Other | Admitting: Registered Nurse

## 2019-01-19 IMAGING — CR DG ABDOMEN 1V
2 series · 2 of 2 positions shown · non-contrast
Comparison: None.

CLINICAL DATA: Preop lithotripsy

EXAM:
ABDOMEN - 1 VIEW

[t abdomen supine (1 of 2)]
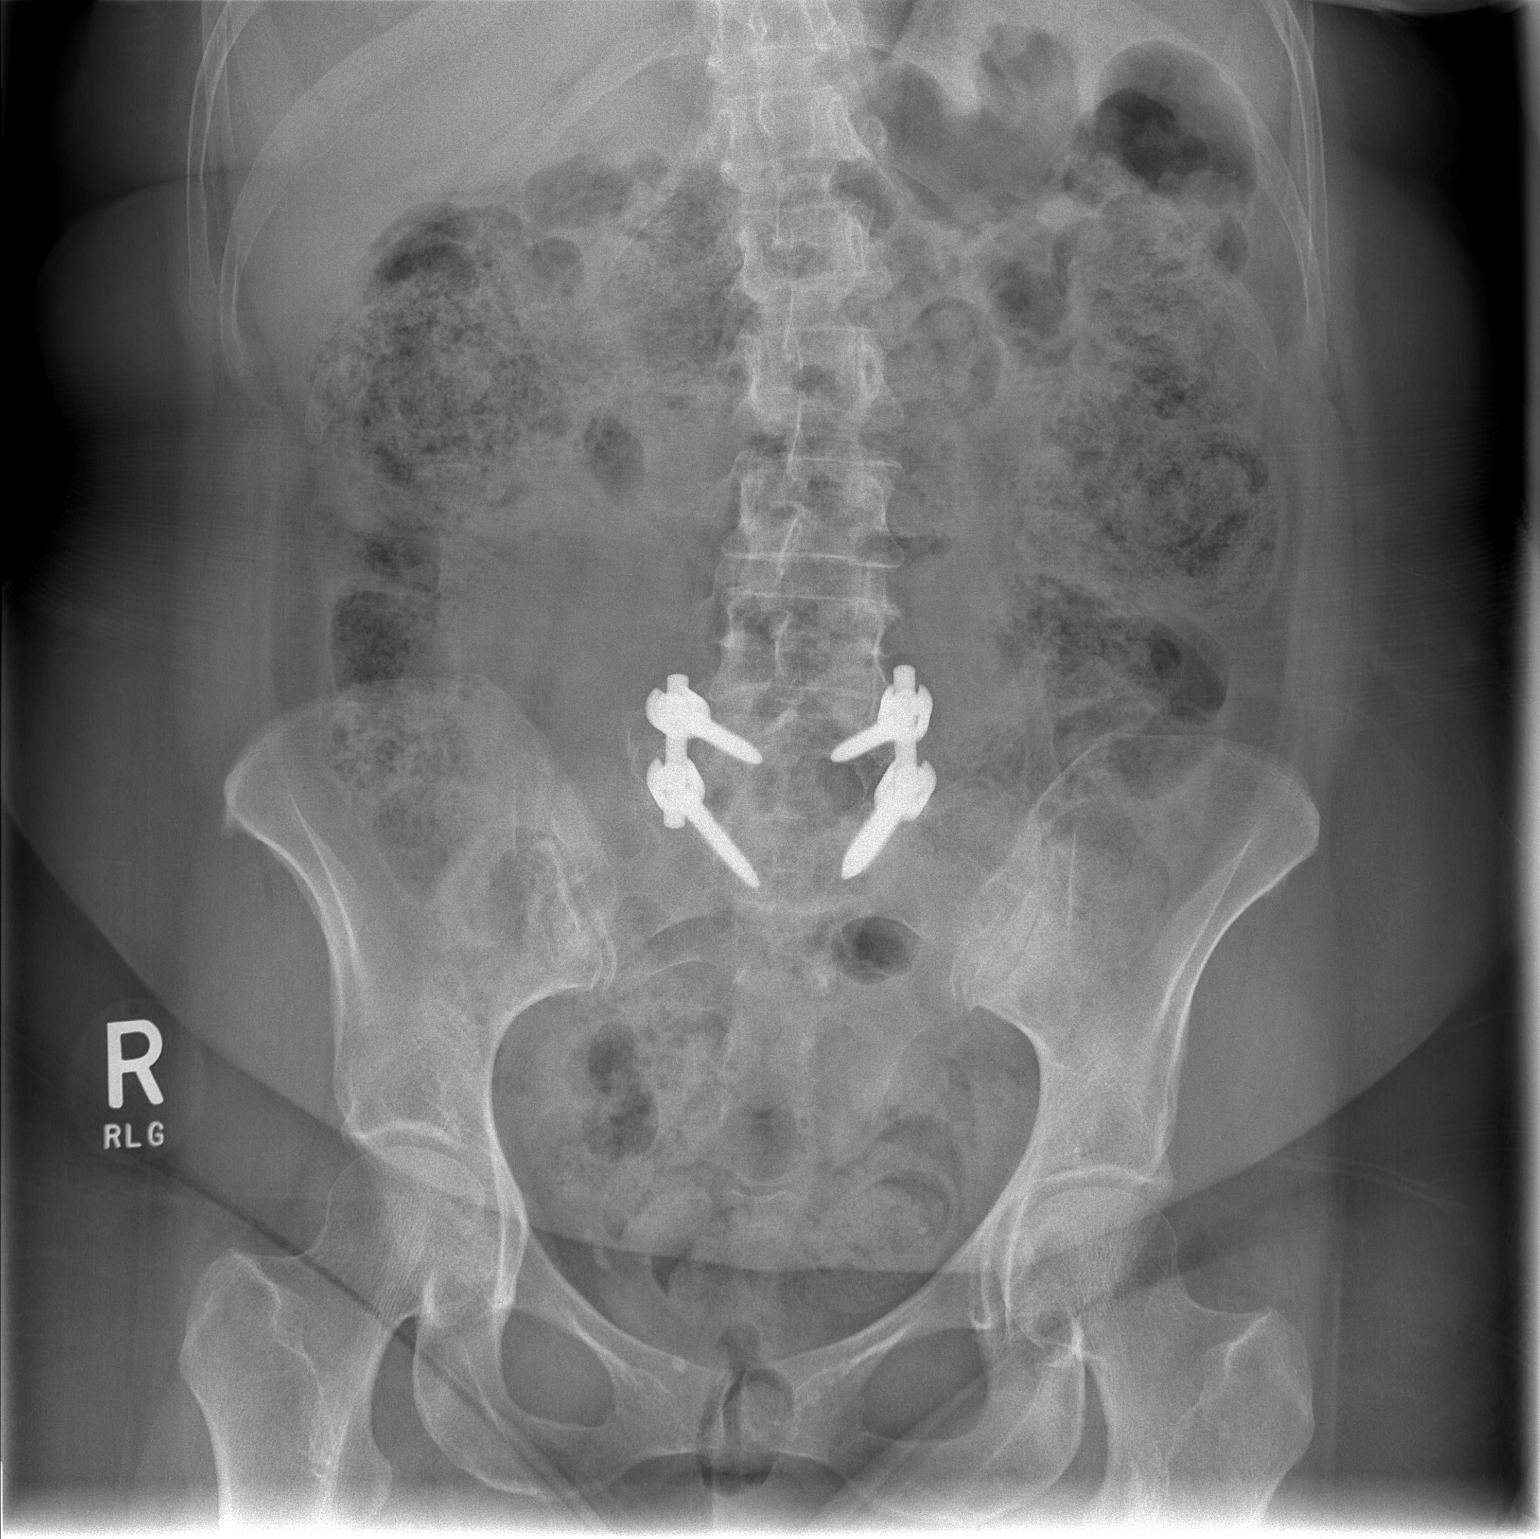

[t abdomen supine (2 of 2)]
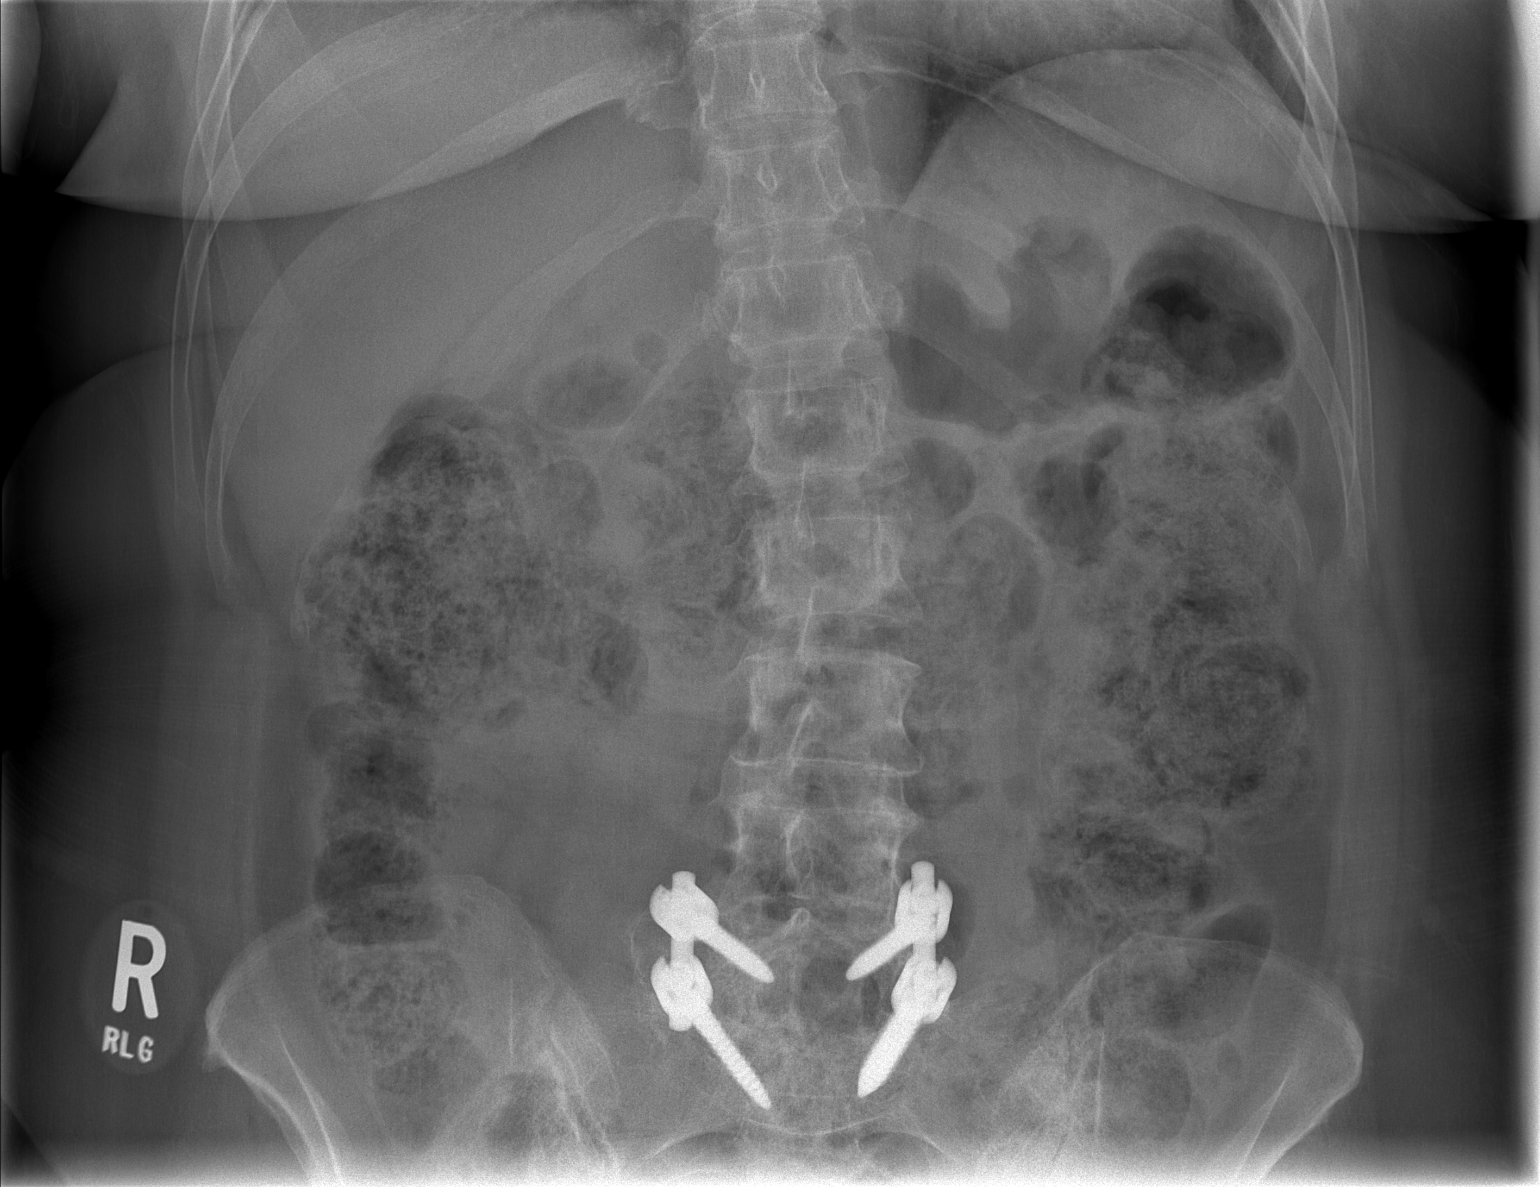

[2 of 2 positions shown; findings below may reference images not displayed]

FINDINGS: There is a large amount of stool throughout the colon. There is a
persistent 9 mm distal right ureteral calculus. There is no bowel
dilatation to suggest obstruction. There is no evidence of
pneumoperitoneum, portal venous gas or pneumatosis.

The osseous structures are unremarkable.
IMPRESSION: Persistent 9 mm distal right ureteral calculus.

## 2019-01-24 ENCOUNTER — Encounter: Payer: Self-pay | Admitting: Registered Nurse

## 2019-01-24 ENCOUNTER — Encounter: Payer: Medicare Other | Attending: Registered Nurse | Admitting: Registered Nurse

## 2019-01-24 ENCOUNTER — Other Ambulatory Visit: Payer: Self-pay

## 2019-01-24 VITALS — BP 107/74 | HR 78 | Resp 14 | Ht 64.0 in | Wt 167.0 lb

## 2019-01-24 DIAGNOSIS — M797 Fibromyalgia: Secondary | ICD-10-CM | POA: Diagnosis not present

## 2019-01-24 DIAGNOSIS — Z981 Arthrodesis status: Secondary | ICD-10-CM | POA: Diagnosis not present

## 2019-01-24 DIAGNOSIS — M1990 Unspecified osteoarthritis, unspecified site: Secondary | ICD-10-CM | POA: Insufficient documentation

## 2019-01-24 DIAGNOSIS — F329 Major depressive disorder, single episode, unspecified: Secondary | ICD-10-CM | POA: Insufficient documentation

## 2019-01-24 DIAGNOSIS — E785 Hyperlipidemia, unspecified: Secondary | ICD-10-CM | POA: Insufficient documentation

## 2019-01-24 DIAGNOSIS — M542 Cervicalgia: Secondary | ICD-10-CM | POA: Diagnosis not present

## 2019-01-24 DIAGNOSIS — Z79891 Long term (current) use of opiate analgesic: Secondary | ICD-10-CM

## 2019-01-24 DIAGNOSIS — M961 Postlaminectomy syndrome, not elsewhere classified: Secondary | ICD-10-CM

## 2019-01-24 DIAGNOSIS — M545 Low back pain, unspecified: Secondary | ICD-10-CM

## 2019-01-24 DIAGNOSIS — M62838 Other muscle spasm: Secondary | ICD-10-CM

## 2019-01-24 DIAGNOSIS — Z87891 Personal history of nicotine dependence: Secondary | ICD-10-CM | POA: Diagnosis not present

## 2019-01-24 DIAGNOSIS — J45909 Unspecified asthma, uncomplicated: Secondary | ICD-10-CM | POA: Insufficient documentation

## 2019-01-24 DIAGNOSIS — G629 Polyneuropathy, unspecified: Secondary | ICD-10-CM | POA: Insufficient documentation

## 2019-01-24 DIAGNOSIS — M2550 Pain in unspecified joint: Secondary | ICD-10-CM

## 2019-01-24 DIAGNOSIS — G8929 Other chronic pain: Secondary | ICD-10-CM

## 2019-01-24 DIAGNOSIS — Z5181 Encounter for therapeutic drug level monitoring: Secondary | ICD-10-CM

## 2019-01-24 DIAGNOSIS — G894 Chronic pain syndrome: Secondary | ICD-10-CM

## 2019-01-24 DIAGNOSIS — M7061 Trochanteric bursitis, right hip: Secondary | ICD-10-CM

## 2019-01-24 MED ORDER — MORPHINE SULFATE ER 30 MG PO TBCR: 30.0000 mg | EXTENDED_RELEASE_TABLET | Freq: Three times a day (TID) | ORAL | 0 refills | Status: DC
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
## 2019-02-01 ENCOUNTER — Telehealth: Payer: Self-pay | Admitting: Psychiatry

## 2019-02-01 ENCOUNTER — Telehealth: Payer: Self-pay

## 2019-02-01 NOTE — Telephone Encounter (Signed)
pt called she states that they pharmacy needs the new rx with the vraylar.  she states she take 2 a day not one.

## 2019-02-01 NOTE — Telephone Encounter (Signed)
Attempted to call [facility] to discuss her medication concern. Left vm.

## 2019-02-05 ENCOUNTER — Other Ambulatory Visit: Payer: Self-pay | Admitting: Internal Medicine

## 2019-02-05 ENCOUNTER — Telehealth: Payer: Self-pay

## 2019-02-05 DIAGNOSIS — F3162 Bipolar disorder, current episode mixed, moderate: Secondary | ICD-10-CM

## 2019-02-05 DIAGNOSIS — Z1231 Encounter for screening mammogram for malignant neoplasm of breast: Secondary | ICD-10-CM

## 2019-02-05 MED ORDER — LAMOTRIGINE 25 MG PO TABS: 50.0000 mg | ORAL_TABLET | Freq: Every day | ORAL | 1 refills | Status: DC

## 2019-02-05 NOTE — Telephone Encounter (Signed)
pt called left message that she was returning your call something about her medications.

## 2019-02-05 NOTE — Telephone Encounter (Signed)
Returned call to patient - will add Lamictal. Stop Vrayalr.

## 2019-02-06 ENCOUNTER — Other Ambulatory Visit: Payer: Self-pay

## 2019-02-06 ENCOUNTER — Encounter: Payer: Medicare Other | Admitting: Psychiatry

## 2019-02-06 NOTE — Progress Notes (Signed)
Called patient - stated she wants to reschedule since she just started new medication. She forgot to call yesterday to reschedule.

## 2019-02-08 ENCOUNTER — Ambulatory Visit: Payer: Medicare Other | Admitting: Psychiatry

## 2019-02-10 ENCOUNTER — Other Ambulatory Visit: Payer: Self-pay

## 2019-02-10 ENCOUNTER — Ambulatory Visit (HOSPITAL_COMMUNITY): Payer: Medicare Other

## 2019-02-10 ENCOUNTER — Ambulatory Visit (HOSPITAL_COMMUNITY)
Admission: EM | Admit: 2019-02-10 | Discharge: 2019-02-10 | Disposition: A | Discharge status: Home / Self Care | Payer: Medicare Other | Attending: Emergency Medicine | Admitting: Emergency Medicine

## 2019-02-10 ENCOUNTER — Ambulatory Visit (INDEPENDENT_AMBULATORY_CARE_PROVIDER_SITE_OTHER): Payer: Medicare Other

## 2019-02-10 DIAGNOSIS — S63501A Unspecified sprain of right wrist, initial encounter: Secondary | ICD-10-CM

## 2019-02-10 NOTE — Discharge Instructions (Signed)
No fracture Wear wrist splint for the next 1-2 weeks Tylenol [phone] mg as needed for pain and swelling every 4-6 hours Ice and elevate  Follow up if not resolving

## 2019-02-10 NOTE — ED Provider Notes (Signed)
[place]    CSN: [id number] Arrival date & time: 02/10/19  1325      History   Chief Complaint Chief Complaint  Patient presents with  . Wrist Injury    HPI Cindy Pratt is a 53 y.o. female history of osteoarthritis, fibromyalgia, presenting today for evaluation of right wrist injury.  Patient fell earlier today and caught her fall with her right wrist and landed awkwardly.  Since she has had pain around her distal radius and with movement of her thumb.  She denies numbness or tingling.  Does note that she previously has had surgery on the tendons in this wrist.  Denies previous fracture.  HPI  Past Medical History:  Diagnosis Date  . Anxiety   . Asthma   . Belchings   . Bipolar disorder ([place])   . Chronic pain syndrome    in pain clinic  . Degenerative joint disease   . Depression   . Disturbance of skin sensation   . Fibromyalgia   . GERD (gastroesophageal reflux disease)   . History of kidney stones   . Hyperlipidemia   . Lumbar post-laminectomy syndrome   . Lumbosacral neuritis   . Osteoarthritis   . Other chronic postoperative pain   . Sciatica     Patient Active Problem List   Diagnosis Date Noted  . Congestion of nasal sinus 09/20/2018  . Sensorineural hearing loss (SNHL), bilateral 06/22/2017  . Bipolar disorder with depression ([place]) 12/22/2016  . Chronic obstructive pulmonary disease, unspecified ([place]) 02/24/2015  . Postlaminectomy syndrome, lumbar region 10/21/2011  . Thoracic or lumbosacral neuritis or radiculitis, unspecified 10/21/2011  . Chronic pain syndrome 10/21/2011  . Sternoclavicular joint disorder 10/21/2011  . DIABETES MELLITUS, TYPE II 08/14/2009  . FLANK PAIN, RIGHT 08/05/2009  . HYPERHIDROSIS 05/26/2009  . SWELLING, MASS, OR LUMP IN CHEST 05/26/2009  . ABDOMINAL TENDERNESS, RIGHT UPPER QUADRANT 05/26/2009  . BACK PAIN 02/18/2009  . ANXIETY 07/22/2008  . DEPRESSION 07/22/2008  . PERIPHERAL NEUROPATHY 07/22/2008  .  ALLERGIC RHINITIS 07/22/2008  . ASTHMA 07/22/2008  . LOW BACK PAIN 07/22/2008  . Fibromyalgia syndrome 07/22/2008  . Nonspecific (abnormal) findings on radiological and other examination of body structure 07/22/2008  . NEPHROLITHIASIS, HX OF 07/22/2008  . CHEST XRAY, ABNORMAL 07/22/2008    Past Surgical History:  Procedure Laterality Date  . ABDOMINAL HYSTERECTOMY    . BACK SURGERY  1997, 2003  . CYSTECTOMY    . DIAGNOSTIC LAPAROSCOPY    . EXTRACORPOREAL SHOCK WAVE LITHOTRIPSY Right 05/04/2018   Procedure: RIGHT EXTRACORPOREAL SHOCK WAVE LITHOTRIPSY (ESWL) WITH MAC;  Surgeon: Kathie Rhodes, MD;  Location: [facility];  Service: Urology;  Laterality: Right;  . WRIST ARTHROPLASTY Left   . WRIST ARTHROSCOPY WITH DEBRIDEMENT Right 11/13/2015   Procedure: RIGHT WRIST ARTHROSCOPY WITH DEBRIDEMENT;  Surgeon: Leanora Cover, MD;  Location: [place];  Service: Orthopedics;  Laterality: Right;    OB History   No obstetric history on file.      Home Medications    Prior to Admission medications   Medication Sig Start Date End Date Taking? Authorizing Provider  buPROPion (WELLBUTRIN SR) 150 MG 12 hr tablet Take 1 tablet (150 mg total) by mouth 2 (two) times daily. 12/11/18   Ursula Alert, MD  fluticasone (FLONASE) 50 MCG/ACT nasal spray Place into the nose. 09/20/18 09/20/19  [provider]  lamoTRIgine (LAMICTAL) 25 MG tablet Take 2 tablets (50 mg total) by mouth daily. Take 25 mg for 2 weeks  and then increase to 50 mg 02/05/19   Ursula Alert, MD  levocetirizine (XYZAL) 5 MG tablet Take by mouth. 09/20/18 09/20/19  [provider]  morphine (MS CONTIN) 30 MG 12 hr tablet Take 1 tablet (30 mg total) by mouth every 8 (eight) hours. 01/24/19   Bayard Hugger, NP  ondansetron (ZOFRAN ODT) 4 MG disintegrating tablet 12m ODT q4 hours prn nausea/vomit 04/10/18   ZMilton Ferguson MD  tiZANidine (ZANAFLEX) 4 MG tablet Take 1 tablet (4 mg total) by mouth 2 (two) times daily.  10/17/18   TBayard Hugger NP  topiramate (TOPAMAX) 50 MG tablet Take 1 tablet (50 mg total) by mouth 2 (two) times daily. 12/18/18   TBayard Hugger NP  traMADol (ULTRAM) 50 MG tablet Take 1 tablet (50 mg total) by mouth 2 (two) times daily as needed. 11/15/18   Kirsteins, ALuanna Salk MD    Family History Family History  Problem Relation Age of Onset  . Hypertension Mother   . Hypertension Father   . Heart attack Father   . Drug abuse Father   . Alcohol abuse Father   . Breast cancer Maternal Grandmother   . Liver cancer Maternal Grandmother   . Cancer Maternal Grandmother        breast cancer  . Ovarian cancer Cousin   . Cancer Cousin        1st c. maternal side/ cervical cancer  . Cancer Cousin        1st c maternal/ colon cancer  . Diabetes Maternal Grandfather        both sides  . Alcohol abuse Paternal Grandfather   . Drug abuse Paternal Grandfather     Social History Social History   Tobacco Use  . Smoking status: Former Smoker    Packs/day: 1.00    Years: 20.00    Pack years: 20.00    Types: Cigarettes    Quit date: 07/31/2007    Years since quitting: 11.5  . Smokeless tobacco: Never Used  Substance Use Topics  . Alcohol use: No  . Drug use: No     Allergies   Aspirin, Nsaids, Sulfa antibiotics, Sulfonamide derivatives, Tolmetin, Cymbalta [duloxetine hcl], Duloxetine, Gabapentin, Other, and Pregabalin   Review of Systems Review of Systems  Constitutional: Negative for fatigue and fever.  Eyes: Negative for visual disturbance.  Respiratory: Negative for shortness of breath.   Cardiovascular: Negative for chest pain.  Gastrointestinal: Negative for abdominal pain, nausea and vomiting.  Musculoskeletal: Positive for arthralgias and joint swelling.  Skin: Negative for color change, rash and wound.  Neurological: Negative for dizziness, weakness, light-headedness and headaches.     Physical Exam Triage Vital Signs ED Triage Vitals [02/10/19 1358]   Enc Vitals Group     BP 123/85     Pulse Rate 69     Resp 16     Temp 98.3 F (36.8 C)     Temp Source Oral     SpO2 96 %     Weight      Height      Head Circumference      Peak Flow      Pain Score 7     Pain Loc      Pain Edu?      Excl. in [place]    No data found.  Updated Vital Signs BP 123/85 (BP Location: Right Arm)   Pulse 69   Temp 98.3 F (36.8 C) (Oral)   Resp 16  SpO2 96%   Visual Acuity Right Eye Distance:   Left Eye Distance:   Bilateral Distance:    Right Eye Near:   Left Eye Near:    Bilateral Near:     Physical Exam Vitals signs and nursing note reviewed.  Constitutional:      Appearance: She is well-developed.     Comments: No acute distress  HENT:     Head: Normocephalic and atraumatic.     Nose: Nose normal.  Eyes:     Conjunctiva/sclera: Conjunctivae normal.  Neck:     Musculoskeletal: Neck supple.  Cardiovascular:     Rate and Rhythm: Normal rate.  Pulmonary:     Effort: Pulmonary effort is normal. No respiratory distress.  Abdominal:     General: There is no distension.  Musculoskeletal: Normal range of motion.     Comments: Right wrist: Mild swelling around distal radius, tender to touch to distal radius, nontender along distal ulna, no snuffbox tenderness, relatively full active range of motion of all fingers, nontender throughout dorsum of hand.  Radial pulse 2+  Right elbow with full active range of motion, nontender to bony prominences  Skin:    General: Skin is warm and dry.  Neurological:     Mental Status: She is alert and oriented to person, place, and time.      UC Treatments / Results  Labs (all labs ordered are listed, but only abnormal results are displayed) Labs Reviewed - No data to display  EKG None  Radiology Dg Wrist Complete Right  Result Date: 02/10/2019 CLINICAL DATA:  Per pt: She fell backward landing on right hand today. Constant pain but hurts more on the lateral aspect of wrist. Swollen. No  previous injury or fx. fall, right wrist pain and swelling EXAM: RIGHT WRIST - COMPLETE 3+ VIEW COMPARISON:  None. FINDINGS: No distal radius or ulnar fracture. Radiocarpal joint is intact. No carpal fracture. No soft tissue abnormality. Calcification in the triangular fibrocartilage. IMPRESSION: 1.  No fracture or dislocation. 2. Mild calcification of the triangular fibrocartilage Electronically Signed   By: Suzy Bouchard M.D.   On: 02/10/2019 14:57    Procedures Procedures (including critical care time)  Medications Ordered in UC Medications - No data to display  Initial Impression / Assessment and Plan / UC Course  I have reviewed the triage vital signs and the nursing notes.  Pertinent labs & imaging results that were available during my care of the patient were reviewed by me and considered in my medical decision making (see chart for details).    No acute bony abnormality on x-ray, mild calcification noticed within wrist.  Likely sprain, no snuffbox tenderness, but given pain with some will place in thumb spica to wear over the next couple weeks.  Tylenol for pain and swelling, ice and elevate.Discussed strict return precautions. Patient verbalized understanding and is agreeable with plan.  Final Clinical Impressions(s) / UC Diagnoses   Final diagnoses:  Sprain of right wrist, initial encounter     Discharge Instructions     No fracture Wear wrist splint for the next 1-2 weeks Tylenol [phone] mg as needed for pain and swelling every 4-6 hours Ice and elevate  Follow up if not resolving   ED Prescriptions    None     Controlled Substance Prescriptions [place] Controlled Substance Registry consulted? Not Applicable   Janith Lima, [place] 02/10/19 1619

## 2019-02-10 NOTE — ED Triage Notes (Signed)
Per pt she fell today and went backwards on her right arm to prevent the fall. Pt noticed swelling and tingling in her right wrist. Pt is not able to move.

## 2019-02-11 ENCOUNTER — Emergency Department (HOSPITAL_COMMUNITY): Payer: Medicare Other

## 2019-02-11 ENCOUNTER — Other Ambulatory Visit: Payer: Self-pay

## 2019-02-11 ENCOUNTER — Encounter (HOSPITAL_COMMUNITY): Payer: Self-pay | Admitting: *Deleted

## 2019-02-11 ENCOUNTER — Emergency Department (HOSPITAL_COMMUNITY)
Admission: EM | Admit: 2019-02-11 | Discharge: 2019-02-11 | Disposition: A | Discharge status: Home / Self Care | Payer: Medicare Other | Attending: Emergency Medicine | Admitting: Emergency Medicine

## 2019-02-11 DIAGNOSIS — Z87891 Personal history of nicotine dependence: Secondary | ICD-10-CM | POA: Diagnosis not present

## 2019-02-11 DIAGNOSIS — M25531 Pain in right wrist: Secondary | ICD-10-CM | POA: Diagnosis not present

## 2019-02-11 DIAGNOSIS — Z79899 Other long term (current) drug therapy: Secondary | ICD-10-CM | POA: Diagnosis not present

## 2019-02-11 DIAGNOSIS — W010XXA Fall on same level from slipping, tripping and stumbling without subsequent striking against object, initial encounter: Secondary | ICD-10-CM | POA: Insufficient documentation

## 2019-02-11 DIAGNOSIS — J45909 Unspecified asthma, uncomplicated: Secondary | ICD-10-CM | POA: Insufficient documentation

## 2019-02-11 MED ORDER — HYDROMORPHONE HCL 1 MG/ML IJ SOLN
0.5000 mg | Freq: Once | INTRAMUSCULAR | Status: AC
  Administered 2019-02-11: 0.5 mg via INTRAMUSCULAR
  Filled 2019-02-11: qty 1

## 2019-02-11 NOTE — ED Triage Notes (Signed)
Pt with right hand pain since fall yesterday, seen yesterday and had right wrist xray done which was negative.  Now pain in hand

## 2019-02-11 NOTE — Discharge Instructions (Addendum)
1. Medications: Home Morphine prescription for pain.  Utilize tylenol for additional pain control, usual home medications 2. Treatment: rest, ice, elevate and use brace, drink plenty of fluids, gentle stretching 3. Follow Up: Please followup with orthopedics as directed or your PCP in 1 week if no improvement for discussion of your diagnoses and further evaluation after today's visit; if you do not have a primary care doctor use the resource guide provided to find one; Please return to the ER for worsening symptoms or other concerns

## 2019-02-11 NOTE — ED Provider Notes (Signed)
[facility] EMERGENCY DEPARTMENT Provider Note   CSN: [id number] Arrival date & time: 02/11/19  1919    History   Chief Complaint Chief Complaint  Patient presents with   Hand Pain    HPI Cindy Pratt is a 53 y.o. female with a hx of fibromyalgia presents to the Emergency Department complaining of acute, persistent right wrist and hand pain after fall yesterday afternoon.  Patient reports she lost her balance and fell backwards landing on her outstretched hand and wrist.  She reports immediate pain.  Patient reports she was evaluated at [facility] urgent care with x-rays.  She reports she was informed that there were no fractures in her hand.  She was placed in the splint.  Patient reports that this afternoon her pain has significantly worsened.  She reports taking her home oral morphine without relief.  She reports movement and palpation make her symptoms worse.  She reports specifically, movement of her ring and little finger exacerbate the pain.  She states it is a 10/10.  Nothing seems to make her symptoms better.  She denies numbness or sensation deficit.     The history is provided by the patient and medical records. No language interpreter was used.    Past Medical History:  Diagnosis Date   Anxiety    Asthma    Belchings    Bipolar disorder ([place])    Chronic pain syndrome    in pain clinic   Degenerative joint disease    Depression    Disturbance of skin sensation    Fibromyalgia    GERD (gastroesophageal reflux disease)    History of kidney stones    Hyperlipidemia    Lumbar post-laminectomy syndrome    Lumbosacral neuritis    Osteoarthritis    Other chronic postoperative pain    Sciatica     Patient Active Problem List   Diagnosis Date Noted   Congestion of nasal sinus 09/20/2018   Sensorineural hearing loss (SNHL), bilateral 06/22/2017   Bipolar disorder with depression ([place]) 12/22/2016   Chronic obstructive pulmonary disease,  unspecified ([place]) 02/24/2015   Postlaminectomy syndrome, lumbar region 10/21/2011   Thoracic or lumbosacral neuritis or radiculitis, unspecified 10/21/2011   Chronic pain syndrome 10/21/2011   Sternoclavicular joint disorder 10/21/2011   DIABETES MELLITUS, TYPE II 08/14/2009   FLANK PAIN, RIGHT 08/05/2009   HYPERHIDROSIS 05/26/2009   SWELLING, MASS, OR LUMP IN CHEST 05/26/2009   ABDOMINAL TENDERNESS, RIGHT UPPER QUADRANT 05/26/2009   BACK PAIN 02/18/2009   ANXIETY 07/22/2008   DEPRESSION 07/22/2008   PERIPHERAL NEUROPATHY 07/22/2008   ALLERGIC RHINITIS 07/22/2008   ASTHMA 07/22/2008   LOW BACK PAIN 07/22/2008   Fibromyalgia syndrome 07/22/2008   Nonspecific (abnormal) findings on radiological and other examination of body structure 07/22/2008   NEPHROLITHIASIS, HX OF 07/22/2008   CHEST XRAY, ABNORMAL 07/22/2008    Past Surgical History:  Procedure Laterality Date   ABDOMINAL HYSTERECTOMY     [place], 2003   CYSTECTOMY     DIAGNOSTIC LAPAROSCOPY     EXTRACORPOREAL SHOCK WAVE LITHOTRIPSY Right 05/04/2018   Procedure: RIGHT EXTRACORPOREAL SHOCK WAVE LITHOTRIPSY (ESWL) WITH MAC;  Surgeon: Kathie Rhodes, MD;  Location: [facility];  Service: Urology;  Laterality: Right;   WRIST ARTHROPLASTY Left    WRIST ARTHROSCOPY WITH DEBRIDEMENT Right 11/13/2015   Procedure: RIGHT WRIST ARTHROSCOPY WITH DEBRIDEMENT;  Surgeon: Leanora Cover, MD;  Location: [place];  Service: Orthopedics;  Laterality: Right;     OB History  No obstetric history on file.      Home Medications    Prior to Admission medications   Medication Sig Start Date End Date Taking? Authorizing Provider  buPROPion (WELLBUTRIN SR) 150 MG 12 hr tablet Take 1 tablet (150 mg total) by mouth 2 (two) times daily. 12/11/18   Ursula Alert, MD  fluticasone (FLONASE) 50 MCG/ACT nasal spray Place into the nose. 09/20/18 09/20/19  [provider]  lamoTRIgine (LAMICTAL) 25 MG  tablet Take 2 tablets (50 mg total) by mouth daily. Take 25 mg for 2 weeks and then increase to 50 mg 02/05/19   Ursula Alert, MD  levocetirizine (XYZAL) 5 MG tablet Take by mouth. 09/20/18 09/20/19  [provider]  morphine (MS CONTIN) 30 MG 12 hr tablet Take 1 tablet (30 mg total) by mouth every 8 (eight) hours. 01/24/19   Bayard Hugger, NP  ondansetron (ZOFRAN ODT) 4 MG disintegrating tablet 57m ODT q4 hours prn nausea/vomit 04/10/18   ZMilton Ferguson MD  tiZANidine (ZANAFLEX) 4 MG tablet Take 1 tablet (4 mg total) by mouth 2 (two) times daily. 10/17/18   TBayard Hugger NP  topiramate (TOPAMAX) 50 MG tablet Take 1 tablet (50 mg total) by mouth 2 (two) times daily. 12/18/18   TBayard Hugger NP  traMADol (ULTRAM) 50 MG tablet Take 1 tablet (50 mg total) by mouth 2 (two) times daily as needed. 11/15/18   Kirsteins, ALuanna Salk MD    Family History Family History  Problem Relation Age of Onset   Hypertension Mother    Hypertension Father    Heart attack Father    Drug abuse Father    Alcohol abuse Father    Breast cancer Maternal Grandmother    Liver cancer Maternal Grandmother    Cancer Maternal Grandmother        breast cancer   Ovarian cancer Cousin    Cancer Cousin        1st c. maternal side/ cervical cancer   Cancer Cousin        1st c maternal/ colon cancer   Diabetes Maternal Grandfather        both sides   Alcohol abuse Paternal Grandfather    Drug abuse Paternal Grandfather     Social History Social History   Tobacco Use   Smoking status: Former Smoker    Packs/day: 1.00    Years: 20.00    Pack years: 20.00    Types: Cigarettes    Quit date: 07/31/2007    Years since quitting: 11.5   Smokeless tobacco: Never Used  Substance Use Topics   Alcohol use: No   Drug use: No     Allergies   Aspirin, Nsaids, Sulfa antibiotics, Sulfonamide derivatives, Tolmetin, Cymbalta [duloxetine hcl], Duloxetine, Gabapentin, Other, and  Pregabalin   Review of Systems Review of Systems  Constitutional: Negative for chills and fever.  Gastrointestinal: Negative for nausea and vomiting.  Musculoskeletal: Positive for arthralgias and joint swelling. Negative for back pain, neck pain and neck stiffness.  Skin: Negative for wound.  Neurological: Negative for numbness.  Hematological: Does not bruise/bleed easily.  Psychiatric/Behavioral: The patient is not nervous/anxious.   All other systems reviewed and are negative.    Physical Exam Updated Vital Signs BP 127/80 (BP Location: Left Arm)    Pulse 73    Temp 98.4 F (36.9 C) (Oral)    Resp 20    Ht _0  (1.626 m)    SpO2 96%    BMI 28.67  kg/m   Physical Exam Vitals signs and nursing note reviewed.  Constitutional:      General: She is not in acute distress.    Appearance: She is well-developed. She is not diaphoretic.  HENT:     Head: Normocephalic and atraumatic.  Eyes:     Conjunctiva/sclera: Conjunctivae normal.  Neck:     Musculoskeletal: Normal range of motion.  Cardiovascular:     Rate and Rhythm: Normal rate and regular rhythm.     Pulses:          Radial pulses are 2+ on the right side.     Comments: Capillary refill < 3 sec in all fingers of the right hand Pulmonary:     Effort: Pulmonary effort is normal.  Musculoskeletal:        General: Tenderness present.     Comments: Full ROM of the right thumb, index and long fingers.  Decreased ROM of the DIP, PIP and MCP of the right ring and little fingers due to pain.   Significant TTP along the metacarpal bones of the hand, particularly on the ulnar side and along the ulnar side of the wrist.  Pt unable to range wrist due to severe pain.  Mild swelling of the wrist and hand noted.   Skin:    General: Skin is warm and dry.     Comments: No tenting of the skin  Neurological:     Mental Status: She is alert.     Coordination: Coordination normal.     Comments: Sensation intact to normal touch throughout  the radial, median and ulnar distributions Strength 5/5 with flexion and extension of the right thumb, index and long fingers.  4/5 with flexion and extension of the right ring and little fingers.       ED Treatments / Results   Radiology Dg Wrist Complete Right  Result Date: 02/10/2019 CLINICAL DATA:  Per pt: She fell backward landing on right hand today. Constant pain but hurts more on the lateral aspect of wrist. Swollen. No previous injury or fx. fall, right wrist pain and swelling EXAM: RIGHT WRIST - COMPLETE 3+ VIEW COMPARISON:  None. FINDINGS: No distal radius or ulnar fracture. Radiocarpal joint is intact. No carpal fracture. No soft tissue abnormality. Calcification in the triangular fibrocartilage. IMPRESSION: 1.  No fracture or dislocation. 2. Mild calcification of the triangular fibrocartilage Electronically Signed   By: Suzy Bouchard M.D.   On: 02/10/2019 14:57   Ct Wrist Right Wo Contrast  Result Date: 02/11/2019 CLINICAL DATA:  [place] yesterday.  Right hand pain since. EXAM: CT OF THE RIGHT WRIST WITHOUT CONTRAST TECHNIQUE: Multidetector CT imaging of the right wrist was performed according to the standard protocol. Multiplanar CT image reconstructions were also generated. COMPARISON:  Right hand radiographs, 02/10/2019. FINDINGS: Bones/Joint/Cartilage No fracture or suspicious bone lesion. Several small cysts noted in the lunate. There is hazy fluid attenuation along dorsal aspect of the wrist joint capsule and, more notably, centered around the triangular fibrocartilage complex. This is consistent with edema. No convincing joint effusion. Ligaments There are scattered calcifications along the triangular fibrocartilage complex, as noted on the current standard radiographs. Ligaments are suboptimally assessed by CT. Muscles and Tendons No evidence of a muscle injury.  Tendons intact. Soft tissues No other soft tissue abnormality. IMPRESSION: 1. No fracture or dislocation. 2. Soft  tissue edema, which is most prominent surrounding the ulnar, carpal articulation and the triangle fibrocartilage complex. If the patient's pain persists, consider wrist MR arthrography  to assess the intrinsic ligaments, which are not well visualized on CT. Electronically Signed   By: Lajean Manes M.D.   On: 02/11/2019 20:35    Procedures Procedures (including critical care time)  Medications Ordered in ED Medications  HYDROmorphone (DILAUDID) injection 0.5 mg (0.5 mg Intramuscular Given 02/11/19 1958)     Initial Impression / Assessment and Plan / ED Course  I have reviewed the triage vital signs and the nursing notes.  Pertinent labs & imaging results that were available during my care of the patient were reviewed by me and considered in my medical decision making (see chart for details).        Presents with severe right wrist pain persistent after a fall yesterday.  She was seen in urgent care and had a plain film which did not show any fracture.  I personally evaluated these images.  She has some decreased range of motion of the right wrist due to pain and joint line tenderness.  CT scan obtained to ensure no subtle fracture of the hand or wrist.  There is some mild soft tissue edema most prominent around the ulnocarpal articulation.  This is the site of patient's most intense pain.  Concern for possible ligamentous injury.  Patient will continue to use thumb spica wrist splint and will follow with hand surgery.  She is to continue taking her home morphine for pain control.  She is to return here for new or worsening symptoms.  Final Clinical Impressions(s) / ED Diagnoses   Final diagnoses:  Right wrist pain    ED Discharge Orders    None       Loni Muse Gwenlyn Perking 02/11/19 2058    Virgel Manifold, MD 02/12/19 1615

## 2019-02-13 ENCOUNTER — Telehealth: Payer: Self-pay

## 2019-02-13 NOTE — Telephone Encounter (Signed)
Patient called requesting for her Tramadol to be increased this month due to having a fall and turn tendons. She states she has an appt with a surgeon this week.

## 2019-02-14 ENCOUNTER — Telehealth: Payer: Self-pay | Admitting: Registered Nurse

## 2019-02-14 MED ORDER — TRAMADOL HCL 50 MG PO TABS: 50.0000 mg | ORAL_TABLET | Freq: Three times a day (TID) | ORAL | 0 refills | Status: DC | PRN

## 2019-02-14 NOTE — Telephone Encounter (Signed)
Returned Ms. Ricke call, she reports she was walking and stepped in a hole and fell backwards. She went to [facility] Urgent care, note was reviewed. On 02/11/2019, she went to [facility] ED for evaluation, note was reviewed. She is scheduled to see Dr. Darol Destine at 10:15 today, she was instructed to call office with update, she verbalizes understanding. At this time all medication will remained the same. She verbalizes understanding.

## 2019-02-14 NOTE — Telephone Encounter (Signed)
Received a call from Ms. Edelen, she reports she has a wrist fracture. She seen Dr. Darol Destine today, we will increase her tramadol to three times a day as needed for pain, she verbalizes understanding. We will evaluate her on her next scheduled appointment she verbalizes understanding.

## 2019-02-20 ENCOUNTER — Encounter: Payer: Self-pay | Admitting: Registered Nurse

## 2019-02-20 ENCOUNTER — Other Ambulatory Visit: Payer: Self-pay

## 2019-02-20 ENCOUNTER — Encounter: Payer: Medicare Other | Attending: Registered Nurse | Admitting: Registered Nurse

## 2019-02-20 VITALS — BP 103/67 | HR 70 | Temp 97.7°F | Resp 14 | Ht 64.0 in | Wt 165.0 lb

## 2019-02-20 DIAGNOSIS — M62838 Other muscle spasm: Secondary | ICD-10-CM

## 2019-02-20 DIAGNOSIS — Z981 Arthrodesis status: Secondary | ICD-10-CM | POA: Diagnosis not present

## 2019-02-20 DIAGNOSIS — M797 Fibromyalgia: Secondary | ICD-10-CM | POA: Diagnosis not present

## 2019-02-20 DIAGNOSIS — M1990 Unspecified osteoarthritis, unspecified site: Secondary | ICD-10-CM | POA: Insufficient documentation

## 2019-02-20 DIAGNOSIS — M542 Cervicalgia: Secondary | ICD-10-CM | POA: Diagnosis not present

## 2019-02-20 DIAGNOSIS — E785 Hyperlipidemia, unspecified: Secondary | ICD-10-CM | POA: Insufficient documentation

## 2019-02-20 DIAGNOSIS — G894 Chronic pain syndrome: Secondary | ICD-10-CM

## 2019-02-20 DIAGNOSIS — G8929 Other chronic pain: Secondary | ICD-10-CM | POA: Diagnosis present

## 2019-02-20 DIAGNOSIS — F329 Major depressive disorder, single episode, unspecified: Secondary | ICD-10-CM | POA: Insufficient documentation

## 2019-02-20 DIAGNOSIS — M545 Low back pain: Secondary | ICD-10-CM | POA: Diagnosis not present

## 2019-02-20 DIAGNOSIS — G629 Polyneuropathy, unspecified: Secondary | ICD-10-CM | POA: Insufficient documentation

## 2019-02-20 DIAGNOSIS — M961 Postlaminectomy syndrome, not elsewhere classified: Secondary | ICD-10-CM

## 2019-02-20 DIAGNOSIS — J45909 Unspecified asthma, uncomplicated: Secondary | ICD-10-CM | POA: Diagnosis not present

## 2019-02-20 DIAGNOSIS — M2550 Pain in unspecified joint: Secondary | ICD-10-CM

## 2019-02-20 DIAGNOSIS — Z5181 Encounter for therapeutic drug level monitoring: Secondary | ICD-10-CM

## 2019-02-20 DIAGNOSIS — Z87891 Personal history of nicotine dependence: Secondary | ICD-10-CM | POA: Insufficient documentation

## 2019-02-20 DIAGNOSIS — Z79891 Long term (current) use of opiate analgesic: Secondary | ICD-10-CM

## 2019-02-20 MED ORDER — MORPHINE SULFATE ER 30 MG PO TBCR: 30.0000 mg | EXTENDED_RELEASE_TABLET | Freq: Three times a day (TID) | ORAL | 0 refills | Status: DC
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
## 2019-03-08 ENCOUNTER — Ambulatory Visit (INDEPENDENT_AMBULATORY_CARE_PROVIDER_SITE_OTHER): Payer: Medicare Other | Admitting: Psychiatry

## 2019-03-08 ENCOUNTER — Encounter: Payer: Self-pay | Admitting: Psychiatry

## 2019-03-08 ENCOUNTER — Other Ambulatory Visit: Payer: Self-pay

## 2019-03-08 DIAGNOSIS — F4310 Post-traumatic stress disorder, unspecified: Secondary | ICD-10-CM | POA: Insufficient documentation

## 2019-03-08 DIAGNOSIS — F3162 Bipolar disorder, current episode mixed, moderate: Secondary | ICD-10-CM

## 2019-03-08 MED ORDER — LAMOTRIGINE 25 MG PO TABS: 75.0000 mg | ORAL_TABLET | Freq: Every day | ORAL | 0 refills | Status: DC

## 2019-03-08 MED ORDER — LAMOTRIGINE 100 MG PO TABS: 100.0000 mg | ORAL_TABLET | Freq: Every day | ORAL | 0 refills | Status: DC

## 2019-03-08 MED ORDER — RISPERIDONE 0.5 MG PO TABS: 0.5000 mg | ORAL_TABLET | Freq: Every day | ORAL | 1 refills | Status: DC
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
## 2019-03-13 ENCOUNTER — Telehealth: Payer: Self-pay | Admitting: *Deleted

## 2019-03-13 MED ORDER — TRAMADOL HCL 50 MG PO TABS: 50.0000 mg | ORAL_TABLET | Freq: Three times a day (TID) | ORAL | 0 refills | Status: DC | PRN

## 2019-03-13 NOTE — Telephone Encounter (Signed)
Mrs Rhein called for a refill on her tramadol, they are leaving town in 2 days. I have checked PMP and the last fill was 02/14/19.  A refill was called to the pharmacy and a VM left for Mrs Kirchoff .

## 2019-03-20 ENCOUNTER — Encounter: Payer: Medicare Other | Attending: Registered Nurse | Admitting: Registered Nurse

## 2019-03-20 ENCOUNTER — Other Ambulatory Visit: Payer: Self-pay

## 2019-03-20 VITALS — BP 96/66 | HR 66 | Temp 97.7°F | Ht 64.0 in | Wt 166.0 lb

## 2019-03-20 DIAGNOSIS — J45909 Unspecified asthma, uncomplicated: Secondary | ICD-10-CM | POA: Insufficient documentation

## 2019-03-20 DIAGNOSIS — M542 Cervicalgia: Secondary | ICD-10-CM

## 2019-03-20 DIAGNOSIS — Z79891 Long term (current) use of opiate analgesic: Secondary | ICD-10-CM

## 2019-03-20 DIAGNOSIS — F329 Major depressive disorder, single episode, unspecified: Secondary | ICD-10-CM | POA: Insufficient documentation

## 2019-03-20 DIAGNOSIS — Z87891 Personal history of nicotine dependence: Secondary | ICD-10-CM | POA: Diagnosis not present

## 2019-03-20 DIAGNOSIS — G8929 Other chronic pain: Secondary | ICD-10-CM | POA: Diagnosis present

## 2019-03-20 DIAGNOSIS — M5416 Radiculopathy, lumbar region: Secondary | ICD-10-CM

## 2019-03-20 DIAGNOSIS — M797 Fibromyalgia: Secondary | ICD-10-CM

## 2019-03-20 DIAGNOSIS — M2550 Pain in unspecified joint: Secondary | ICD-10-CM | POA: Diagnosis not present

## 2019-03-20 DIAGNOSIS — E785 Hyperlipidemia, unspecified: Secondary | ICD-10-CM | POA: Insufficient documentation

## 2019-03-20 DIAGNOSIS — M1990 Unspecified osteoarthritis, unspecified site: Secondary | ICD-10-CM | POA: Insufficient documentation

## 2019-03-20 DIAGNOSIS — G629 Polyneuropathy, unspecified: Secondary | ICD-10-CM | POA: Insufficient documentation

## 2019-03-20 DIAGNOSIS — M961 Postlaminectomy syndrome, not elsewhere classified: Secondary | ICD-10-CM

## 2019-03-20 DIAGNOSIS — Z981 Arthrodesis status: Secondary | ICD-10-CM | POA: Insufficient documentation

## 2019-03-20 DIAGNOSIS — G894 Chronic pain syndrome: Secondary | ICD-10-CM

## 2019-03-20 DIAGNOSIS — M62838 Other muscle spasm: Secondary | ICD-10-CM

## 2019-03-20 DIAGNOSIS — M7061 Trochanteric bursitis, right hip: Secondary | ICD-10-CM

## 2019-03-20 DIAGNOSIS — Z5181 Encounter for therapeutic drug level monitoring: Secondary | ICD-10-CM

## 2019-03-20 MED ORDER — MORPHINE SULFATE ER 30 MG PO TBCR: 30.0000 mg | EXTENDED_RELEASE_TABLET | Freq: Three times a day (TID) | ORAL | 0 refills | Status: DC
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
## 2019-03-21 ENCOUNTER — Ambulatory Visit: Payer: Medicare Other | Admitting: Registered Nurse

## 2019-03-22 ENCOUNTER — Ambulatory Visit: Payer: Medicare Other

## 2019-03-23 ENCOUNTER — Encounter: Payer: Self-pay | Admitting: Registered Nurse

## 2019-04-09 ENCOUNTER — Other Ambulatory Visit: Payer: Self-pay | Admitting: Psychiatry

## 2019-04-09 DIAGNOSIS — F4310 Post-traumatic stress disorder, unspecified: Secondary | ICD-10-CM

## 2019-04-09 DIAGNOSIS — F3162 Bipolar disorder, current episode mixed, moderate: Secondary | ICD-10-CM

## 2019-04-09 MED ORDER — BUPROPION HCL ER (SR) 150 MG PO TB12: 150.0000 mg | ORAL_TABLET | Freq: Two times a day (BID) | ORAL | 3 refills | Status: DC

## 2019-04-09 NOTE — Telephone Encounter (Signed)
Sent wellbutrin to pharmacy. 

## 2019-04-12 ENCOUNTER — Telehealth: Payer: Self-pay | Admitting: Registered Nurse

## 2019-04-12 ENCOUNTER — Encounter: Payer: Self-pay | Admitting: Registered Nurse

## 2019-04-12 ENCOUNTER — Telehealth: Payer: Self-pay

## 2019-04-12 ENCOUNTER — Other Ambulatory Visit: Payer: Self-pay

## 2019-04-12 ENCOUNTER — Telehealth: Payer: Self-pay | Admitting: Psychiatry

## 2019-04-12 ENCOUNTER — Encounter: Payer: Medicare Other | Attending: Registered Nurse | Admitting: Registered Nurse

## 2019-04-12 VITALS — Wt 167.0 lb

## 2019-04-12 DIAGNOSIS — Z79891 Long term (current) use of opiate analgesic: Secondary | ICD-10-CM

## 2019-04-12 DIAGNOSIS — M5412 Radiculopathy, cervical region: Secondary | ICD-10-CM

## 2019-04-12 DIAGNOSIS — M542 Cervicalgia: Secondary | ICD-10-CM | POA: Diagnosis not present

## 2019-04-12 DIAGNOSIS — M797 Fibromyalgia: Secondary | ICD-10-CM | POA: Diagnosis not present

## 2019-04-12 DIAGNOSIS — M961 Postlaminectomy syndrome, not elsewhere classified: Secondary | ICD-10-CM | POA: Insufficient documentation

## 2019-04-12 DIAGNOSIS — G629 Polyneuropathy, unspecified: Secondary | ICD-10-CM | POA: Insufficient documentation

## 2019-04-12 DIAGNOSIS — F4310 Post-traumatic stress disorder, unspecified: Secondary | ICD-10-CM

## 2019-04-12 DIAGNOSIS — M62838 Other muscle spasm: Secondary | ICD-10-CM

## 2019-04-12 DIAGNOSIS — Z981 Arthrodesis status: Secondary | ICD-10-CM | POA: Insufficient documentation

## 2019-04-12 DIAGNOSIS — M1990 Unspecified osteoarthritis, unspecified site: Secondary | ICD-10-CM | POA: Insufficient documentation

## 2019-04-12 DIAGNOSIS — E785 Hyperlipidemia, unspecified: Secondary | ICD-10-CM | POA: Insufficient documentation

## 2019-04-12 DIAGNOSIS — M5416 Radiculopathy, lumbar region: Secondary | ICD-10-CM

## 2019-04-12 DIAGNOSIS — Z87891 Personal history of nicotine dependence: Secondary | ICD-10-CM | POA: Insufficient documentation

## 2019-04-12 DIAGNOSIS — Z5181 Encounter for therapeutic drug level monitoring: Secondary | ICD-10-CM

## 2019-04-12 DIAGNOSIS — J45909 Unspecified asthma, uncomplicated: Secondary | ICD-10-CM | POA: Insufficient documentation

## 2019-04-12 DIAGNOSIS — F3162 Bipolar disorder, current episode mixed, moderate: Secondary | ICD-10-CM

## 2019-04-12 DIAGNOSIS — G894 Chronic pain syndrome: Secondary | ICD-10-CM

## 2019-04-12 DIAGNOSIS — F329 Major depressive disorder, single episode, unspecified: Secondary | ICD-10-CM | POA: Insufficient documentation

## 2019-04-12 MED ORDER — RISPERIDONE 1 MG PO TABS: 1.0000 mg | ORAL_TABLET | Freq: Every day | ORAL | 1 refills | Status: DC

## 2019-04-12 MED ORDER — MORPHINE SULFATE ER 30 MG PO TBCR: 30.0000 mg | EXTENDED_RELEASE_TABLET | Freq: Three times a day (TID) | ORAL | 0 refills | Status: DC

## 2019-04-12 MED ORDER — TRAMADOL HCL 50 MG PO TABS: 50.0000 mg | ORAL_TABLET | Freq: Three times a day (TID) | ORAL | 0 refills | Status: DC | PRN

## 2019-04-12 NOTE — Telephone Encounter (Signed)
[name] visit changed to tele-heath visit, she was having diarrhea. Medications e-scribed and Tramadol tablets decreased.

## 2019-04-12 NOTE — Telephone Encounter (Signed)
Called patient - discussed her medication concern. Increase Risperidone to 1 mg. She feels Lamictal is making her to cry more , will discontinue Lamictal.

## 2019-04-12 NOTE — Telephone Encounter (Signed)
pt called states that she is having alot of crying spells while takin the lamictal. is there anything else she can try.
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
## 2019-04-13 ENCOUNTER — Telehealth: Payer: Self-pay | Admitting: Registered Nurse

## 2019-04-13 ENCOUNTER — Encounter: Payer: Self-pay | Admitting: Registered Nurse

## 2019-04-13 MED ORDER — TIZANIDINE HCL 4 MG PO TABS: 4.0000 mg | ORAL_TABLET | Freq: Two times a day (BID) | ORAL | 5 refills | Status: DC

## 2019-04-13 MED ORDER — TOPIRAMATE 50 MG PO TABS: 50.0000 mg | ORAL_TABLET | Freq: Two times a day (BID) | ORAL | 3 refills | Status: DC

## 2019-04-13 NOTE — Telephone Encounter (Signed)
Pt called she forgot to tell you yesterday during her appointment that she needs refills on Zanaflex and Topamax.

## 2019-04-13 NOTE — Telephone Encounter (Signed)
Medications sent to pharmacy

## 2019-04-25 ENCOUNTER — Encounter: Payer: Self-pay | Admitting: Psychiatry

## 2019-04-25 ENCOUNTER — Ambulatory Visit (INDEPENDENT_AMBULATORY_CARE_PROVIDER_SITE_OTHER): Payer: Medicare Other | Admitting: Psychiatry

## 2019-04-25 ENCOUNTER — Other Ambulatory Visit: Payer: Self-pay

## 2019-04-25 DIAGNOSIS — F4310 Post-traumatic stress disorder, unspecified: Secondary | ICD-10-CM

## 2019-04-25 DIAGNOSIS — F3162 Bipolar disorder, current episode mixed, moderate: Secondary | ICD-10-CM

## 2019-04-25 MED ORDER — RISPERIDONE 1 MG PO TABS: 1.5000 mg | ORAL_TABLET | Freq: Every day | ORAL | 1 refills | Status: DC
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
## 2019-05-02 ENCOUNTER — Ambulatory Visit: Payer: Medicare Other

## 2019-05-09 ENCOUNTER — Encounter: Payer: Medicare Other | Attending: Registered Nurse | Admitting: Registered Nurse

## 2019-05-09 ENCOUNTER — Other Ambulatory Visit: Payer: Self-pay

## 2019-05-09 VITALS — BP 111/76 | HR 80 | Temp 98.9°F | Ht 64.0 in | Wt 163.4 lb

## 2019-05-09 DIAGNOSIS — J45909 Unspecified asthma, uncomplicated: Secondary | ICD-10-CM | POA: Diagnosis not present

## 2019-05-09 DIAGNOSIS — Z981 Arthrodesis status: Secondary | ICD-10-CM | POA: Diagnosis not present

## 2019-05-09 DIAGNOSIS — M797 Fibromyalgia: Secondary | ICD-10-CM | POA: Diagnosis not present

## 2019-05-09 DIAGNOSIS — M1990 Unspecified osteoarthritis, unspecified site: Secondary | ICD-10-CM | POA: Insufficient documentation

## 2019-05-09 DIAGNOSIS — E785 Hyperlipidemia, unspecified: Secondary | ICD-10-CM | POA: Diagnosis not present

## 2019-05-09 DIAGNOSIS — M5416 Radiculopathy, lumbar region: Secondary | ICD-10-CM

## 2019-05-09 DIAGNOSIS — M5412 Radiculopathy, cervical region: Secondary | ICD-10-CM | POA: Diagnosis not present

## 2019-05-09 DIAGNOSIS — F329 Major depressive disorder, single episode, unspecified: Secondary | ICD-10-CM | POA: Insufficient documentation

## 2019-05-09 DIAGNOSIS — G894 Chronic pain syndrome: Secondary | ICD-10-CM

## 2019-05-09 DIAGNOSIS — M961 Postlaminectomy syndrome, not elsewhere classified: Secondary | ICD-10-CM | POA: Diagnosis not present

## 2019-05-09 DIAGNOSIS — Z79891 Long term (current) use of opiate analgesic: Secondary | ICD-10-CM

## 2019-05-09 DIAGNOSIS — M2550 Pain in unspecified joint: Secondary | ICD-10-CM

## 2019-05-09 DIAGNOSIS — Z87891 Personal history of nicotine dependence: Secondary | ICD-10-CM | POA: Insufficient documentation

## 2019-05-09 DIAGNOSIS — M542 Cervicalgia: Secondary | ICD-10-CM

## 2019-05-09 DIAGNOSIS — G629 Polyneuropathy, unspecified: Secondary | ICD-10-CM | POA: Insufficient documentation

## 2019-05-09 DIAGNOSIS — M62838 Other muscle spasm: Secondary | ICD-10-CM

## 2019-05-09 DIAGNOSIS — G8929 Other chronic pain: Secondary | ICD-10-CM | POA: Diagnosis present

## 2019-05-09 DIAGNOSIS — M7062 Trochanteric bursitis, left hip: Secondary | ICD-10-CM

## 2019-05-09 DIAGNOSIS — Z5181 Encounter for therapeutic drug level monitoring: Secondary | ICD-10-CM

## 2019-05-09 DIAGNOSIS — M7061 Trochanteric bursitis, right hip: Secondary | ICD-10-CM

## 2019-05-09 MED ORDER — MORPHINE SULFATE ER 30 MG PO TBCR: 30.0000 mg | EXTENDED_RELEASE_TABLET | Freq: Three times a day (TID) | ORAL | 0 refills | Status: DC

## 2019-05-09 MED ORDER — TRAMADOL HCL 50 MG PO TABS: 50.0000 mg | ORAL_TABLET | Freq: Three times a day (TID) | ORAL | 0 refills | Status: DC | PRN
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
## 2019-05-10 ENCOUNTER — Encounter: Payer: Self-pay | Admitting: Registered Nurse

## 2019-05-30 ENCOUNTER — Ambulatory Visit (INDEPENDENT_AMBULATORY_CARE_PROVIDER_SITE_OTHER): Payer: Medicare Other | Admitting: Psychiatry

## 2019-05-30 ENCOUNTER — Other Ambulatory Visit: Payer: Self-pay

## 2019-05-30 DIAGNOSIS — F4310 Post-traumatic stress disorder, unspecified: Secondary | ICD-10-CM

## 2019-05-30 DIAGNOSIS — F3162 Bipolar disorder, current episode mixed, moderate: Secondary | ICD-10-CM

## 2019-05-30 NOTE — Progress Notes (Signed)
Patient wants to reschedule.

## 2019-06-07 ENCOUNTER — Encounter: Payer: Medicare Other | Attending: Registered Nurse | Admitting: Registered Nurse

## 2019-06-07 ENCOUNTER — Encounter: Payer: Self-pay | Admitting: Registered Nurse

## 2019-06-07 ENCOUNTER — Ambulatory Visit
Admission: RE | Admit: 2019-06-07 | Discharge: 2019-06-07 | Disposition: A | Discharge status: Home / Self Care | Payer: Medicare Other | Source: Ambulatory Visit | Attending: Registered Nurse | Admitting: Registered Nurse

## 2019-06-07 ENCOUNTER — Other Ambulatory Visit: Payer: Self-pay

## 2019-06-07 VITALS — BP 104/69 | HR 72 | Temp 97.5°F | Ht 64.0 in | Wt 162.0 lb

## 2019-06-07 DIAGNOSIS — G43001 Migraine without aura, not intractable, with status migrainosus: Secondary | ICD-10-CM

## 2019-06-07 DIAGNOSIS — M5412 Radiculopathy, cervical region: Secondary | ICD-10-CM

## 2019-06-07 DIAGNOSIS — J45909 Unspecified asthma, uncomplicated: Secondary | ICD-10-CM | POA: Insufficient documentation

## 2019-06-07 DIAGNOSIS — Z981 Arthrodesis status: Secondary | ICD-10-CM | POA: Diagnosis not present

## 2019-06-07 DIAGNOSIS — M961 Postlaminectomy syndrome, not elsewhere classified: Secondary | ICD-10-CM | POA: Insufficient documentation

## 2019-06-07 DIAGNOSIS — E785 Hyperlipidemia, unspecified: Secondary | ICD-10-CM | POA: Insufficient documentation

## 2019-06-07 DIAGNOSIS — M545 Low back pain, unspecified: Secondary | ICD-10-CM

## 2019-06-07 DIAGNOSIS — M797 Fibromyalgia: Secondary | ICD-10-CM | POA: Diagnosis not present

## 2019-06-07 DIAGNOSIS — Z79891 Long term (current) use of opiate analgesic: Secondary | ICD-10-CM

## 2019-06-07 DIAGNOSIS — Z5181 Encounter for therapeutic drug level monitoring: Secondary | ICD-10-CM

## 2019-06-07 DIAGNOSIS — M542 Cervicalgia: Secondary | ICD-10-CM

## 2019-06-07 DIAGNOSIS — G629 Polyneuropathy, unspecified: Secondary | ICD-10-CM | POA: Diagnosis not present

## 2019-06-07 DIAGNOSIS — G8929 Other chronic pain: Secondary | ICD-10-CM

## 2019-06-07 DIAGNOSIS — Z87891 Personal history of nicotine dependence: Secondary | ICD-10-CM | POA: Insufficient documentation

## 2019-06-07 DIAGNOSIS — F329 Major depressive disorder, single episode, unspecified: Secondary | ICD-10-CM | POA: Diagnosis not present

## 2019-06-07 DIAGNOSIS — G894 Chronic pain syndrome: Secondary | ICD-10-CM

## 2019-06-07 DIAGNOSIS — M1990 Unspecified osteoarthritis, unspecified site: Secondary | ICD-10-CM | POA: Diagnosis not present

## 2019-06-07 DIAGNOSIS — M5416 Radiculopathy, lumbar region: Secondary | ICD-10-CM

## 2019-06-07 DIAGNOSIS — M62838 Other muscle spasm: Secondary | ICD-10-CM

## 2019-06-07 MED ORDER — MORPHINE SULFATE ER 30 MG PO TBCR: 30.0000 mg | EXTENDED_RELEASE_TABLET | Freq: Three times a day (TID) | ORAL | 0 refills | Status: DC

## 2019-06-07 MED ORDER — METHYLPREDNISOLONE 4 MG PO TBPK: ORAL_TABLET | ORAL | 0 refills | Status: DC

## 2019-06-07 MED ORDER — TRAMADOL HCL 50 MG PO TABS: 50.0000 mg | ORAL_TABLET | Freq: Three times a day (TID) | ORAL | 0 refills | Status: DC | PRN
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
## 2019-06-11 ENCOUNTER — Ambulatory Visit: Payer: Medicare Other | Admitting: Licensed Clinical Social Worker

## 2019-06-12 ENCOUNTER — Ambulatory Visit: Payer: Medicare Other

## 2019-07-02 ENCOUNTER — Other Ambulatory Visit: Payer: Self-pay | Admitting: Psychiatry

## 2019-07-02 ENCOUNTER — Telehealth: Payer: Self-pay | Admitting: Psychiatry

## 2019-07-02 DIAGNOSIS — F4310 Post-traumatic stress disorder, unspecified: Secondary | ICD-10-CM

## 2019-07-02 DIAGNOSIS — F3162 Bipolar disorder, current episode mixed, moderate: Secondary | ICD-10-CM

## 2019-07-02 MED ORDER — BUPROPION HCL ER (SR) 150 MG PO TB12: 150.0000 mg | ORAL_TABLET | Freq: Two times a day (BID) | ORAL | 3 refills | Status: DC

## 2019-07-02 MED ORDER — RISPERIDONE 1 MG PO TABS: 1.5000 mg | ORAL_TABLET | Freq: Every day | ORAL | 1 refills | Status: DC

## 2019-07-02 NOTE — Telephone Encounter (Signed)
Sent risperidone to pharmacy

## 2019-07-05 ENCOUNTER — Ambulatory Visit (INDEPENDENT_AMBULATORY_CARE_PROVIDER_SITE_OTHER): Payer: Medicare Other | Admitting: Psychiatry

## 2019-07-05 ENCOUNTER — Other Ambulatory Visit: Payer: Self-pay

## 2019-07-05 ENCOUNTER — Encounter: Payer: Self-pay | Admitting: Psychiatry

## 2019-07-05 DIAGNOSIS — F4310 Post-traumatic stress disorder, unspecified: Secondary | ICD-10-CM | POA: Diagnosis not present

## 2019-07-05 DIAGNOSIS — F3162 Bipolar disorder, current episode mixed, moderate: Secondary | ICD-10-CM

## 2019-07-05 DIAGNOSIS — Z9189 Other specified personal risk factors, not elsewhere classified: Secondary | ICD-10-CM | POA: Insufficient documentation

## 2019-07-05 MED ORDER — RISPERIDONE 2 MG PO TABS: 2.0000 mg | ORAL_TABLET | Freq: Every day | ORAL | 0 refills | Status: DC
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
## 2019-07-06 ENCOUNTER — Encounter: Payer: Medicare Other | Attending: Registered Nurse | Admitting: Registered Nurse

## 2019-07-06 ENCOUNTER — Other Ambulatory Visit: Payer: Self-pay

## 2019-07-06 ENCOUNTER — Encounter: Payer: Self-pay | Admitting: Registered Nurse

## 2019-07-06 VITALS — BP 106/69 | HR 74 | Temp 97.7°F | Ht 64.0 in | Wt 158.2 lb

## 2019-07-06 DIAGNOSIS — Z5181 Encounter for therapeutic drug level monitoring: Secondary | ICD-10-CM | POA: Insufficient documentation

## 2019-07-06 DIAGNOSIS — E785 Hyperlipidemia, unspecified: Secondary | ICD-10-CM | POA: Insufficient documentation

## 2019-07-06 DIAGNOSIS — G894 Chronic pain syndrome: Secondary | ICD-10-CM | POA: Diagnosis not present

## 2019-07-06 DIAGNOSIS — M5416 Radiculopathy, lumbar region: Secondary | ICD-10-CM

## 2019-07-06 DIAGNOSIS — M797 Fibromyalgia: Secondary | ICD-10-CM | POA: Diagnosis not present

## 2019-07-06 DIAGNOSIS — M5412 Radiculopathy, cervical region: Secondary | ICD-10-CM

## 2019-07-06 DIAGNOSIS — M1990 Unspecified osteoarthritis, unspecified site: Secondary | ICD-10-CM | POA: Insufficient documentation

## 2019-07-06 DIAGNOSIS — M961 Postlaminectomy syndrome, not elsewhere classified: Secondary | ICD-10-CM | POA: Diagnosis not present

## 2019-07-06 DIAGNOSIS — M542 Cervicalgia: Secondary | ICD-10-CM

## 2019-07-06 DIAGNOSIS — Z79891 Long term (current) use of opiate analgesic: Secondary | ICD-10-CM | POA: Diagnosis not present

## 2019-07-06 DIAGNOSIS — G629 Polyneuropathy, unspecified: Secondary | ICD-10-CM | POA: Diagnosis not present

## 2019-07-06 DIAGNOSIS — Z981 Arthrodesis status: Secondary | ICD-10-CM | POA: Diagnosis not present

## 2019-07-06 DIAGNOSIS — G8929 Other chronic pain: Secondary | ICD-10-CM | POA: Diagnosis present

## 2019-07-06 DIAGNOSIS — F329 Major depressive disorder, single episode, unspecified: Secondary | ICD-10-CM | POA: Insufficient documentation

## 2019-07-06 DIAGNOSIS — J45909 Unspecified asthma, uncomplicated: Secondary | ICD-10-CM | POA: Diagnosis not present

## 2019-07-06 DIAGNOSIS — Z87891 Personal history of nicotine dependence: Secondary | ICD-10-CM | POA: Diagnosis not present

## 2019-07-06 MED ORDER — MORPHINE SULFATE ER 30 MG PO TBCR: 30.0000 mg | EXTENDED_RELEASE_TABLET | Freq: Three times a day (TID) | ORAL | 0 refills | Status: DC

## 2019-07-06 MED ORDER — TRAMADOL HCL 50 MG PO TABS: 50.0000 mg | ORAL_TABLET | Freq: Three times a day (TID) | ORAL | 0 refills | Status: DC | PRN

## 2019-07-06 MED ORDER — TOPIRAMATE 50 MG PO TABS: 50.0000 mg | ORAL_TABLET | Freq: Two times a day (BID) | ORAL | 3 refills | Status: DC
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
## 2019-07-11 LAB — DRUG TOX MONITOR 1 W/CONF, ORAL FLD
Amphetamines: NEGATIVE ng/mL (ref <10)
Barbiturates: NEGATIVE ng/mL (ref <10)
Benzodiazepines: NEGATIVE ng/mL (ref <0.50)
Buprenorphine: NEGATIVE ng/mL (ref <0.10)
Cocaine: NEGATIVE ng/mL (ref <5.0)
Codeine: NEGATIVE ng/mL (ref <2.5)
Dihydrocodeine: NEGATIVE ng/mL (ref <2.5)
Fentanyl: NEGATIVE ng/mL (ref <0.10)
Heroin Metabolite: NEGATIVE ng/mL (ref <1.0)
Hydrocodone: NEGATIVE ng/mL (ref <2.5)
Hydromorphone: NEGATIVE ng/mL (ref <2.5)
MARIJUANA: NEGATIVE ng/mL (ref <2.5)
MDMA: NEGATIVE ng/mL (ref <10)
Meprobamate: NEGATIVE ng/mL (ref <2.5)
Methadone: NEGATIVE ng/mL (ref <5.0)
Morphine: 38.1 ng/mL — ABNORMAL HIGH (ref <2.5)
Nicotine Metabolite: NEGATIVE ng/mL (ref <5.0)
Norhydrocodone: NEGATIVE ng/mL (ref <2.5)
Noroxycodone: NEGATIVE ng/mL (ref <2.5)
Opiates: POSITIVE ng/mL — AB (ref <2.5)
Oxycodone: NEGATIVE ng/mL (ref <2.5)
Oxymorphone: NEGATIVE ng/mL (ref <2.5)
Phencyclidine: NEGATIVE ng/mL (ref <10)
Tapentadol: NEGATIVE ng/mL (ref <5.0)
Tramadol: >500 ng/mL — ABNORMAL HIGH (ref <5.0)
Tramadol: POSITIVE ng/mL — AB (ref <5.0)
Zolpidem: NEGATIVE ng/mL (ref <5.0)

## 2019-07-11 LAB — DRUG TOX ALC METAB W/CON, ORAL FLD: Alcohol Metabolite: NEGATIVE ng/mL (ref <25)

## 2019-07-12 ENCOUNTER — Telehealth: Payer: Self-pay | Admitting: *Deleted

## 2019-07-12 NOTE — Telephone Encounter (Signed)
Oral swab drug screen was consistent for prescribed medications.  ?

## 2019-07-19 ENCOUNTER — Ambulatory Visit (INDEPENDENT_AMBULATORY_CARE_PROVIDER_SITE_OTHER): Payer: Medicare Other | Admitting: Licensed Clinical Social Worker

## 2019-07-19 ENCOUNTER — Other Ambulatory Visit: Payer: Self-pay

## 2019-07-19 DIAGNOSIS — F4310 Post-traumatic stress disorder, unspecified: Secondary | ICD-10-CM | POA: Diagnosis not present

## 2019-07-19 DIAGNOSIS — F3162 Bipolar disorder, current episode mixed, moderate: Secondary | ICD-10-CM | POA: Diagnosis not present

## 2019-07-19 DIAGNOSIS — F4312 Post-traumatic stress disorder, chronic: Secondary | ICD-10-CM | POA: Diagnosis not present

## 2019-07-20 ENCOUNTER — Encounter: Payer: Self-pay | Admitting: Licensed Clinical Social Worker
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
## 2019-07-31 ENCOUNTER — Other Ambulatory Visit: Payer: Self-pay

## 2019-07-31 ENCOUNTER — Encounter: Payer: Self-pay | Admitting: Physical Medicine & Rehabilitation

## 2019-07-31 ENCOUNTER — Encounter: Payer: Medicare Other | Attending: Registered Nurse | Admitting: Physical Medicine & Rehabilitation

## 2019-07-31 VITALS — BP 93/64 | HR 77 | Temp 97.5°F | Ht 64.0 in | Wt 152.0 lb

## 2019-07-31 DIAGNOSIS — Z981 Arthrodesis status: Secondary | ICD-10-CM | POA: Insufficient documentation

## 2019-07-31 DIAGNOSIS — F329 Major depressive disorder, single episode, unspecified: Secondary | ICD-10-CM | POA: Insufficient documentation

## 2019-07-31 DIAGNOSIS — G8929 Other chronic pain: Secondary | ICD-10-CM | POA: Diagnosis present

## 2019-07-31 DIAGNOSIS — G629 Polyneuropathy, unspecified: Secondary | ICD-10-CM | POA: Diagnosis not present

## 2019-07-31 DIAGNOSIS — J45909 Unspecified asthma, uncomplicated: Secondary | ICD-10-CM | POA: Diagnosis not present

## 2019-07-31 DIAGNOSIS — Z79891 Long term (current) use of opiate analgesic: Secondary | ICD-10-CM | POA: Insufficient documentation

## 2019-07-31 DIAGNOSIS — M797 Fibromyalgia: Secondary | ICD-10-CM | POA: Insufficient documentation

## 2019-07-31 DIAGNOSIS — M961 Postlaminectomy syndrome, not elsewhere classified: Secondary | ICD-10-CM | POA: Insufficient documentation

## 2019-07-31 DIAGNOSIS — M1990 Unspecified osteoarthritis, unspecified site: Secondary | ICD-10-CM | POA: Insufficient documentation

## 2019-07-31 DIAGNOSIS — Z87891 Personal history of nicotine dependence: Secondary | ICD-10-CM | POA: Diagnosis not present

## 2019-07-31 DIAGNOSIS — E785 Hyperlipidemia, unspecified: Secondary | ICD-10-CM | POA: Insufficient documentation

## 2019-07-31 DIAGNOSIS — G894 Chronic pain syndrome: Secondary | ICD-10-CM | POA: Diagnosis present

## 2019-07-31 DIAGNOSIS — Z5181 Encounter for therapeutic drug level monitoring: Secondary | ICD-10-CM | POA: Insufficient documentation

## 2019-07-31 DIAGNOSIS — M533 Sacrococcygeal disorders, not elsewhere classified: Secondary | ICD-10-CM

## 2019-07-31 MED ORDER — MORPHINE SULFATE ER 30 MG PO TBCR: 30.0000 mg | EXTENDED_RELEASE_TABLET | Freq: Three times a day (TID) | ORAL | 0 refills | Status: DC

## 2019-07-31 MED ORDER — TRAMADOL HCL 50 MG PO TABS: 50.0000 mg | ORAL_TABLET | Freq: Three times a day (TID) | ORAL | 0 refills | Status: DC | PRN

## 2019-07-31 NOTE — Patient Instructions (Signed)
Will block nerves to Left sacroiliac joint If this gives you temporary relief, you would be a candidate for radiofrequency neurotomy of theee same nerves
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
## 2019-08-01 ENCOUNTER — Ambulatory Visit: Payer: Medicare Other | Admitting: Registered Nurse

## 2019-08-14 ENCOUNTER — Other Ambulatory Visit: Payer: Self-pay

## 2019-08-14 ENCOUNTER — Ambulatory Visit: Payer: Medicare Other | Admitting: Licensed Clinical Social Worker

## 2019-08-16 ENCOUNTER — Other Ambulatory Visit: Payer: Self-pay

## 2019-08-16 ENCOUNTER — Ambulatory Visit (INDEPENDENT_AMBULATORY_CARE_PROVIDER_SITE_OTHER): Payer: Medicare Other | Admitting: Psychiatry

## 2019-08-16 DIAGNOSIS — Z5329 Procedure and treatment not carried out because of patient's decision for other reasons: Secondary | ICD-10-CM

## 2019-08-16 NOTE — Progress Notes (Signed)
No response to call or text. 

## 2019-08-21 ENCOUNTER — Encounter: Payer: Medicare Other | Admitting: Physical Medicine & Rehabilitation

## 2019-08-28 ENCOUNTER — Ambulatory Visit (INDEPENDENT_AMBULATORY_CARE_PROVIDER_SITE_OTHER): Payer: Medicare Other | Admitting: Psychiatry

## 2019-08-28 ENCOUNTER — Encounter: Payer: Self-pay | Admitting: Psychiatry

## 2019-08-28 ENCOUNTER — Other Ambulatory Visit: Payer: Self-pay

## 2019-08-28 DIAGNOSIS — F3162 Bipolar disorder, current episode mixed, moderate: Secondary | ICD-10-CM | POA: Diagnosis not present

## 2019-08-28 DIAGNOSIS — F4310 Post-traumatic stress disorder, unspecified: Secondary | ICD-10-CM | POA: Diagnosis not present

## 2019-08-28 MED ORDER — LAMOTRIGINE 25 MG PO TABS: 25.0000 mg | ORAL_TABLET | Freq: Every day | ORAL | 0 refills | Status: DC
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
## 2019-09-04 ENCOUNTER — Other Ambulatory Visit: Payer: Self-pay

## 2019-09-04 DIAGNOSIS — F3162 Bipolar disorder, current episode mixed, moderate: Secondary | ICD-10-CM

## 2019-09-04 MED ORDER — TRAMADOL HCL 50 MG PO TABS: 50.0000 mg | ORAL_TABLET | Freq: Three times a day (TID) | ORAL | 1 refills | Status: DC | PRN

## 2019-09-06 ENCOUNTER — Other Ambulatory Visit: Payer: Self-pay | Admitting: Physical Medicine & Rehabilitation

## 2019-09-07 ENCOUNTER — Ambulatory Visit: Payer: Medicare Other | Admitting: Licensed Clinical Social Worker

## 2019-09-07 ENCOUNTER — Other Ambulatory Visit: Payer: Self-pay

## 2019-09-07 ENCOUNTER — Encounter: Payer: Medicare Other | Admitting: Physical Medicine & Rehabilitation

## 2019-09-18 ENCOUNTER — Other Ambulatory Visit: Payer: Self-pay

## 2019-09-18 ENCOUNTER — Encounter: Payer: Medicare Other | Attending: Registered Nurse | Admitting: Physical Medicine & Rehabilitation

## 2019-09-18 ENCOUNTER — Encounter: Payer: Self-pay | Admitting: Physical Medicine & Rehabilitation

## 2019-09-18 VITALS — BP 97/68 | HR 72 | Temp 97.5°F | Ht 64.0 in | Wt 158.0 lb

## 2019-09-18 DIAGNOSIS — Z87891 Personal history of nicotine dependence: Secondary | ICD-10-CM | POA: Diagnosis not present

## 2019-09-18 DIAGNOSIS — Z79891 Long term (current) use of opiate analgesic: Secondary | ICD-10-CM | POA: Diagnosis present

## 2019-09-18 DIAGNOSIS — G894 Chronic pain syndrome: Secondary | ICD-10-CM | POA: Insufficient documentation

## 2019-09-18 DIAGNOSIS — Z5181 Encounter for therapeutic drug level monitoring: Secondary | ICD-10-CM | POA: Diagnosis present

## 2019-09-18 DIAGNOSIS — G8929 Other chronic pain: Secondary | ICD-10-CM | POA: Diagnosis present

## 2019-09-18 DIAGNOSIS — M961 Postlaminectomy syndrome, not elsewhere classified: Secondary | ICD-10-CM | POA: Diagnosis not present

## 2019-09-18 DIAGNOSIS — Z981 Arthrodesis status: Secondary | ICD-10-CM | POA: Insufficient documentation

## 2019-09-18 DIAGNOSIS — M1990 Unspecified osteoarthritis, unspecified site: Secondary | ICD-10-CM | POA: Insufficient documentation

## 2019-09-18 DIAGNOSIS — M533 Sacrococcygeal disorders, not elsewhere classified: Secondary | ICD-10-CM

## 2019-09-18 DIAGNOSIS — J45909 Unspecified asthma, uncomplicated: Secondary | ICD-10-CM | POA: Insufficient documentation

## 2019-09-18 DIAGNOSIS — G629 Polyneuropathy, unspecified: Secondary | ICD-10-CM | POA: Diagnosis not present

## 2019-09-18 DIAGNOSIS — E785 Hyperlipidemia, unspecified: Secondary | ICD-10-CM | POA: Diagnosis not present

## 2019-09-18 DIAGNOSIS — M797 Fibromyalgia: Secondary | ICD-10-CM | POA: Diagnosis not present

## 2019-09-18 DIAGNOSIS — F329 Major depressive disorder, single episode, unspecified: Secondary | ICD-10-CM | POA: Diagnosis not present

## 2019-09-18 NOTE — Progress Notes (Signed)
L5 dorsal ramus S1-S2-S3 lateral branch blocks under fluoroscopic guidance °RIGHT  side ° °Informed consent was obtained after describing risks and benefits of the procedure with patient these include bleeding bruising and infection.  He elects to proceed and has given written consent.  Patient placed prone on fluoroscopy table Betadine prep sterile drape a 25-gauge 1.5 inch needle was used to anesthetize skin and subcu tissue with 1% lidocaine 1 cc into each of 4 sites.  Then a 22-gauge 3.5" needle was inserted under fluoroscopic guidance for starting the S1 SAP sacral ala junction.  Bone contact made.  Isovue 200 x 0.5 mL demonstrated no intravascular uptake then 0.5 mL of 2% lidocaine was injected.  Then the lateral aspect of the S1, S2, S3 foramen was targeted.  Bone contact made out, Isovue-200 times 0.5 mL demonstrated no nerve root or intravascular uptake within 0.5 mL of 2% lidocaine solution was injected after negative drawback for blood.  Patient tolerated procedure well.  Postinjection instructions given. °

## 2019-09-18 NOTE — Progress Notes (Signed)
  PROCEDURE RECORD [facility]   Name: RUDOLPH DOBLER DOB:1966-05-09 MRN: [id number]  Date:09/18/2019  Physician: Claudette Laws, MD    Nurse/CMA: Bright,CMA  Allergies:  Allergies  Allergen Reactions  . Aspirin Shortness Of Breath  . Nsaids Shortness Of Breath  . Sulfa Antibiotics Hives  . Sulfonamide Derivatives Hives  . Tolmetin Shortness Of Breath  . Cymbalta [Duloxetine Hcl] Other (See Comments)    confusion  . Duloxetine Other (See Comments)    confusion  . Gabapentin Other (See Comments)    Causes confusion  . Other Other (See Comments)    Aswanghda? Patient is not sure of spelling it is an herb- caused confusion  . Pregabalin Other (See Comments)    confusion    Consent Signed: Yes.    Is patient diabetic? No.  CBG today?  Pregnant: No. LMP: No LMP recorded. Patient has had a hysterectomy. (age 55-55)  Anticoagulants: no Anti-inflammatory: no Antibiotics: no  Procedure: Right  L5-S12,3 MBB Position: Prone   Start Time: 256pm End Time: 305pm Fluoro Time: 31s  RN/CMA Nedra Hai, CMA Bright, CMA    Time 2:26pm 310pm    BP 97 68 107/72    Pulse 72 70    Respirations 16 16    O2 Sat 98 95    S/S 6 6    Pain Level 7/10 5/10     D/C home with husband, patient A & O X 3, D/C instructions reviewed, and sits independently.

## 2019-09-18 NOTE — Patient Instructions (Addendum)
  WOuld not repeat this procedure given < 50% pain relief

## 2019-09-19 ENCOUNTER — Ambulatory Visit (INDEPENDENT_AMBULATORY_CARE_PROVIDER_SITE_OTHER): Payer: Medicare Other | Admitting: Psychiatry

## 2019-09-19 ENCOUNTER — Encounter: Payer: Self-pay | Admitting: Psychiatry

## 2019-09-19 DIAGNOSIS — F3162 Bipolar disorder, current episode mixed, moderate: Secondary | ICD-10-CM

## 2019-09-19 DIAGNOSIS — F4310 Post-traumatic stress disorder, unspecified: Secondary | ICD-10-CM

## 2019-09-19 MED ORDER — BUPROPION HCL ER (SR) 150 MG PO TB12: 150.0000 mg | ORAL_TABLET | Freq: Two times a day (BID) | ORAL | 0 refills | Status: DC

## 2019-09-19 MED ORDER — RISPERIDONE 2 MG PO TABS: 2.0000 mg | ORAL_TABLET | Freq: Every day | ORAL | 0 refills | Status: DC

## 2019-09-19 MED ORDER — LAMOTRIGINE 25 MG PO TABS: 50.0000 mg | ORAL_TABLET | Freq: Every day | ORAL | 1 refills | Status: DC
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
## 2019-10-03 ENCOUNTER — Encounter: Payer: Medicare Other | Attending: Registered Nurse | Admitting: Registered Nurse

## 2019-10-03 ENCOUNTER — Encounter: Payer: Self-pay | Admitting: Registered Nurse

## 2019-10-03 ENCOUNTER — Other Ambulatory Visit: Payer: Self-pay

## 2019-10-03 VITALS — BP 109/74 | HR 77 | Temp 97.5°F | Ht 64.0 in | Wt 152.0 lb

## 2019-10-03 DIAGNOSIS — M797 Fibromyalgia: Secondary | ICD-10-CM

## 2019-10-03 DIAGNOSIS — G8929 Other chronic pain: Secondary | ICD-10-CM | POA: Diagnosis present

## 2019-10-03 DIAGNOSIS — Z5181 Encounter for therapeutic drug level monitoring: Secondary | ICD-10-CM | POA: Diagnosis present

## 2019-10-03 DIAGNOSIS — E785 Hyperlipidemia, unspecified: Secondary | ICD-10-CM | POA: Diagnosis not present

## 2019-10-03 DIAGNOSIS — Z981 Arthrodesis status: Secondary | ICD-10-CM | POA: Diagnosis not present

## 2019-10-03 DIAGNOSIS — M1990 Unspecified osteoarthritis, unspecified site: Secondary | ICD-10-CM | POA: Insufficient documentation

## 2019-10-03 DIAGNOSIS — G894 Chronic pain syndrome: Secondary | ICD-10-CM

## 2019-10-03 DIAGNOSIS — M545 Low back pain: Secondary | ICD-10-CM | POA: Diagnosis not present

## 2019-10-03 DIAGNOSIS — Z87891 Personal history of nicotine dependence: Secondary | ICD-10-CM | POA: Diagnosis not present

## 2019-10-03 DIAGNOSIS — M961 Postlaminectomy syndrome, not elsewhere classified: Secondary | ICD-10-CM | POA: Diagnosis not present

## 2019-10-03 DIAGNOSIS — J45909 Unspecified asthma, uncomplicated: Secondary | ICD-10-CM | POA: Insufficient documentation

## 2019-10-03 DIAGNOSIS — Z79891 Long term (current) use of opiate analgesic: Secondary | ICD-10-CM | POA: Diagnosis present

## 2019-10-03 DIAGNOSIS — G629 Polyneuropathy, unspecified: Secondary | ICD-10-CM | POA: Insufficient documentation

## 2019-10-03 DIAGNOSIS — F329 Major depressive disorder, single episode, unspecified: Secondary | ICD-10-CM | POA: Insufficient documentation

## 2019-10-03 MED ORDER — MORPHINE SULFATE ER 30 MG PO TBCR: 30.0000 mg | EXTENDED_RELEASE_TABLET | Freq: Three times a day (TID) | ORAL | 0 refills | Status: DC

## 2019-10-03 MED ORDER — TIZANIDINE HCL 4 MG PO TABS: 4.0000 mg | ORAL_TABLET | Freq: Two times a day (BID) | ORAL | 5 refills | Status: DC
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
## 2019-10-09 ENCOUNTER — Other Ambulatory Visit: Payer: Self-pay

## 2019-10-09 ENCOUNTER — Ambulatory Visit: Payer: Medicare Other | Admitting: Licensed Clinical Social Worker

## 2019-10-18 ENCOUNTER — Ambulatory Visit: Payer: Medicare Other | Admitting: Psychiatry

## 2019-10-31 ENCOUNTER — Encounter: Payer: Self-pay | Admitting: Registered Nurse

## 2019-10-31 ENCOUNTER — Encounter: Payer: Medicare Other | Attending: Registered Nurse | Admitting: Registered Nurse

## 2019-10-31 ENCOUNTER — Other Ambulatory Visit: Payer: Self-pay

## 2019-10-31 VITALS — BP 102/69 | HR 84 | Temp 97.7°F | Ht 64.0 in | Wt 149.0 lb

## 2019-10-31 DIAGNOSIS — G894 Chronic pain syndrome: Secondary | ICD-10-CM | POA: Diagnosis present

## 2019-10-31 DIAGNOSIS — M961 Postlaminectomy syndrome, not elsewhere classified: Secondary | ICD-10-CM | POA: Diagnosis not present

## 2019-10-31 DIAGNOSIS — M545 Low back pain, unspecified: Secondary | ICD-10-CM

## 2019-10-31 DIAGNOSIS — E785 Hyperlipidemia, unspecified: Secondary | ICD-10-CM | POA: Insufficient documentation

## 2019-10-31 DIAGNOSIS — Z5181 Encounter for therapeutic drug level monitoring: Secondary | ICD-10-CM | POA: Diagnosis present

## 2019-10-31 DIAGNOSIS — M1990 Unspecified osteoarthritis, unspecified site: Secondary | ICD-10-CM | POA: Insufficient documentation

## 2019-10-31 DIAGNOSIS — G8929 Other chronic pain: Secondary | ICD-10-CM

## 2019-10-31 DIAGNOSIS — J45909 Unspecified asthma, uncomplicated: Secondary | ICD-10-CM | POA: Insufficient documentation

## 2019-10-31 DIAGNOSIS — Z981 Arthrodesis status: Secondary | ICD-10-CM | POA: Insufficient documentation

## 2019-10-31 DIAGNOSIS — M797 Fibromyalgia: Secondary | ICD-10-CM | POA: Diagnosis not present

## 2019-10-31 DIAGNOSIS — Z87891 Personal history of nicotine dependence: Secondary | ICD-10-CM | POA: Diagnosis not present

## 2019-10-31 DIAGNOSIS — F329 Major depressive disorder, single episode, unspecified: Secondary | ICD-10-CM | POA: Insufficient documentation

## 2019-10-31 DIAGNOSIS — Z79891 Long term (current) use of opiate analgesic: Secondary | ICD-10-CM | POA: Insufficient documentation

## 2019-10-31 DIAGNOSIS — G629 Polyneuropathy, unspecified: Secondary | ICD-10-CM | POA: Diagnosis not present

## 2019-10-31 DIAGNOSIS — M542 Cervicalgia: Secondary | ICD-10-CM

## 2019-10-31 MED ORDER — TOPIRAMATE 50 MG PO TABS: 50.0000 mg | ORAL_TABLET | Freq: Two times a day (BID) | ORAL | 3 refills | Status: DC

## 2019-10-31 MED ORDER — TRAMADOL HCL 50 MG PO TABS: 50.0000 mg | ORAL_TABLET | Freq: Three times a day (TID) | ORAL | 2 refills | Status: DC | PRN

## 2019-10-31 MED ORDER — MORPHINE SULFATE ER 30 MG PO TBCR: 30.0000 mg | EXTENDED_RELEASE_TABLET | Freq: Three times a day (TID) | ORAL | 0 refills | Status: DC
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
## 2019-11-01 ENCOUNTER — Ambulatory Visit: Payer: Medicare Other | Admitting: Registered Nurse

## 2019-11-02 ENCOUNTER — Other Ambulatory Visit: Payer: Self-pay | Admitting: Psychiatry

## 2019-11-02 DIAGNOSIS — F4310 Post-traumatic stress disorder, unspecified: Secondary | ICD-10-CM

## 2019-11-02 DIAGNOSIS — F3162 Bipolar disorder, current episode mixed, moderate: Secondary | ICD-10-CM

## 2019-11-29 ENCOUNTER — Encounter: Payer: Self-pay | Admitting: Registered Nurse

## 2019-11-29 ENCOUNTER — Other Ambulatory Visit: Payer: Self-pay

## 2019-11-29 ENCOUNTER — Encounter: Payer: Medicare Other | Attending: Registered Nurse | Admitting: Registered Nurse

## 2019-11-29 VITALS — BP 107/73 | HR 69 | Ht 63.0 in | Wt 148.8 lb

## 2019-11-29 DIAGNOSIS — Z87891 Personal history of nicotine dependence: Secondary | ICD-10-CM | POA: Diagnosis not present

## 2019-11-29 DIAGNOSIS — M961 Postlaminectomy syndrome, not elsewhere classified: Secondary | ICD-10-CM | POA: Diagnosis not present

## 2019-11-29 DIAGNOSIS — M1990 Unspecified osteoarthritis, unspecified site: Secondary | ICD-10-CM | POA: Insufficient documentation

## 2019-11-29 DIAGNOSIS — J45909 Unspecified asthma, uncomplicated: Secondary | ICD-10-CM | POA: Insufficient documentation

## 2019-11-29 DIAGNOSIS — G629 Polyneuropathy, unspecified: Secondary | ICD-10-CM | POA: Insufficient documentation

## 2019-11-29 DIAGNOSIS — M25562 Pain in left knee: Secondary | ICD-10-CM

## 2019-11-29 DIAGNOSIS — Z981 Arthrodesis status: Secondary | ICD-10-CM | POA: Diagnosis not present

## 2019-11-29 DIAGNOSIS — E785 Hyperlipidemia, unspecified: Secondary | ICD-10-CM | POA: Diagnosis not present

## 2019-11-29 DIAGNOSIS — G8929 Other chronic pain: Secondary | ICD-10-CM | POA: Diagnosis present

## 2019-11-29 DIAGNOSIS — G894 Chronic pain syndrome: Secondary | ICD-10-CM | POA: Insufficient documentation

## 2019-11-29 DIAGNOSIS — Y92009 Unspecified place in unspecified non-institutional (private) residence as the place of occurrence of the external cause: Secondary | ICD-10-CM

## 2019-11-29 DIAGNOSIS — F329 Major depressive disorder, single episode, unspecified: Secondary | ICD-10-CM | POA: Diagnosis not present

## 2019-11-29 DIAGNOSIS — M545 Low back pain: Secondary | ICD-10-CM

## 2019-11-29 DIAGNOSIS — M797 Fibromyalgia: Secondary | ICD-10-CM | POA: Diagnosis not present

## 2019-11-29 DIAGNOSIS — M25561 Pain in right knee: Secondary | ICD-10-CM

## 2019-11-29 DIAGNOSIS — Z5181 Encounter for therapeutic drug level monitoring: Secondary | ICD-10-CM | POA: Insufficient documentation

## 2019-11-29 DIAGNOSIS — Z79891 Long term (current) use of opiate analgesic: Secondary | ICD-10-CM | POA: Diagnosis not present

## 2019-11-29 DIAGNOSIS — W19XXXA Unspecified fall, initial encounter: Secondary | ICD-10-CM

## 2019-11-29 MED ORDER — MORPHINE SULFATE ER 30 MG PO TBCR: 30.0000 mg | EXTENDED_RELEASE_TABLET | Freq: Three times a day (TID) | ORAL | 0 refills | Status: DC
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
## 2019-11-30 ENCOUNTER — Other Ambulatory Visit: Payer: Self-pay | Admitting: Psychiatry

## 2019-11-30 DIAGNOSIS — F3162 Bipolar disorder, current episode mixed, moderate: Secondary | ICD-10-CM

## 2019-12-02 LAB — DRUG TOX MONITOR 1 W/CONF, ORAL FLD
Amphetamines: NEGATIVE ng/mL (ref <10)
Barbiturates: NEGATIVE ng/mL (ref <10)
Benzodiazepines: NEGATIVE ng/mL (ref <0.50)
Buprenorphine: NEGATIVE ng/mL (ref <0.10)
Cocaine: NEGATIVE ng/mL (ref <5.0)
Codeine: NEGATIVE ng/mL (ref <2.5)
Dihydrocodeine: NEGATIVE ng/mL (ref <2.5)
Fentanyl: NEGATIVE ng/mL (ref <0.10)
Heroin Metabolite: NEGATIVE ng/mL (ref <1.0)
Hydrocodone: NEGATIVE ng/mL (ref <2.5)
Hydromorphone: NEGATIVE ng/mL (ref <2.5)
MARIJUANA: NEGATIVE ng/mL (ref <2.5)
MDMA: NEGATIVE ng/mL (ref <10)
Meprobamate: NEGATIVE ng/mL (ref <2.5)
Methadone: NEGATIVE ng/mL (ref <5.0)
Morphine: 48.3 ng/mL — ABNORMAL HIGH (ref <2.5)
Nicotine Metabolite: NEGATIVE ng/mL (ref <5.0)
Norhydrocodone: NEGATIVE ng/mL (ref <2.5)
Noroxycodone: NEGATIVE ng/mL (ref <2.5)
Opiates: POSITIVE ng/mL — AB (ref <2.5)
Oxycodone: NEGATIVE ng/mL (ref <2.5)
Oxymorphone: NEGATIVE ng/mL (ref <2.5)
Phencyclidine: NEGATIVE ng/mL (ref <10)
Tapentadol: NEGATIVE ng/mL (ref <5.0)
Tramadol: >500 ng/mL — ABNORMAL HIGH (ref <5.0)
Tramadol: POSITIVE ng/mL — AB (ref <5.0)
Zolpidem: NEGATIVE ng/mL (ref <5.0)

## 2019-12-02 LAB — DRUG TOX ALC METAB W/CON, ORAL FLD: Alcohol Metabolite: NEGATIVE ng/mL (ref <25)

## 2019-12-03 ENCOUNTER — Telehealth: Payer: Self-pay | Admitting: *Deleted

## 2019-12-03 NOTE — Telephone Encounter (Signed)
Oral swab drug screen was consistent for prescribed medications.  ?

## 2019-12-04 ENCOUNTER — Telehealth: Payer: Self-pay

## 2019-12-04 NOTE — Telephone Encounter (Signed)
UDS RESULTS CONSISTENT WITH MEDICATIONS ON FILE  

## 2019-12-29 ENCOUNTER — Other Ambulatory Visit: Payer: Self-pay | Admitting: Psychiatry

## 2019-12-29 DIAGNOSIS — F3162 Bipolar disorder, current episode mixed, moderate: Secondary | ICD-10-CM

## 2019-12-31 ENCOUNTER — Encounter: Payer: Self-pay | Admitting: Registered Nurse

## 2019-12-31 ENCOUNTER — Other Ambulatory Visit: Payer: Self-pay

## 2019-12-31 ENCOUNTER — Encounter: Payer: Medicare Other | Attending: Registered Nurse | Admitting: Registered Nurse

## 2019-12-31 VITALS — BP 96/63 | HR 62 | Temp 98.5°F | Ht 63.0 in | Wt 146.4 lb

## 2019-12-31 DIAGNOSIS — G8929 Other chronic pain: Secondary | ICD-10-CM

## 2019-12-31 DIAGNOSIS — Z87891 Personal history of nicotine dependence: Secondary | ICD-10-CM | POA: Diagnosis not present

## 2019-12-31 DIAGNOSIS — Z5181 Encounter for therapeutic drug level monitoring: Secondary | ICD-10-CM | POA: Diagnosis not present

## 2019-12-31 DIAGNOSIS — F329 Major depressive disorder, single episode, unspecified: Secondary | ICD-10-CM | POA: Diagnosis not present

## 2019-12-31 DIAGNOSIS — M545 Low back pain, unspecified: Secondary | ICD-10-CM

## 2019-12-31 DIAGNOSIS — M1990 Unspecified osteoarthritis, unspecified site: Secondary | ICD-10-CM | POA: Insufficient documentation

## 2019-12-31 DIAGNOSIS — E785 Hyperlipidemia, unspecified: Secondary | ICD-10-CM | POA: Insufficient documentation

## 2019-12-31 DIAGNOSIS — Z981 Arthrodesis status: Secondary | ICD-10-CM | POA: Diagnosis not present

## 2019-12-31 DIAGNOSIS — J45909 Unspecified asthma, uncomplicated: Secondary | ICD-10-CM | POA: Diagnosis not present

## 2019-12-31 DIAGNOSIS — M25552 Pain in left hip: Secondary | ICD-10-CM

## 2019-12-31 DIAGNOSIS — M797 Fibromyalgia: Secondary | ICD-10-CM | POA: Insufficient documentation

## 2019-12-31 DIAGNOSIS — G894 Chronic pain syndrome: Secondary | ICD-10-CM | POA: Diagnosis not present

## 2019-12-31 DIAGNOSIS — Z79891 Long term (current) use of opiate analgesic: Secondary | ICD-10-CM

## 2019-12-31 DIAGNOSIS — M961 Postlaminectomy syndrome, not elsewhere classified: Secondary | ICD-10-CM

## 2019-12-31 DIAGNOSIS — G629 Polyneuropathy, unspecified: Secondary | ICD-10-CM | POA: Insufficient documentation

## 2019-12-31 MED ORDER — MORPHINE SULFATE ER 30 MG PO TBCR: 30.0000 mg | EXTENDED_RELEASE_TABLET | Freq: Three times a day (TID) | ORAL | 0 refills | Status: DC

## 2019-12-31 MED ORDER — TRAMADOL HCL 50 MG PO TABS: 50.0000 mg | ORAL_TABLET | Freq: Three times a day (TID) | ORAL | 2 refills | Status: DC | PRN
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
## 2020-01-21 ENCOUNTER — Telehealth (INDEPENDENT_AMBULATORY_CARE_PROVIDER_SITE_OTHER): Payer: Medicare Other | Admitting: Psychiatry

## 2020-01-21 ENCOUNTER — Other Ambulatory Visit: Payer: Self-pay

## 2020-01-21 ENCOUNTER — Encounter: Payer: Self-pay | Admitting: Psychiatry

## 2020-01-21 DIAGNOSIS — F4310 Post-traumatic stress disorder, unspecified: Secondary | ICD-10-CM | POA: Diagnosis not present

## 2020-01-21 DIAGNOSIS — F3178 Bipolar disorder, in full remission, most recent episode mixed: Secondary | ICD-10-CM

## 2020-01-21 MED ORDER — LAMOTRIGINE 25 MG PO TABS: 50.0000 mg | ORAL_TABLET | Freq: Every day | ORAL | 1 refills | Status: DC

## 2020-01-21 MED ORDER — BUPROPION HCL ER (SR) 150 MG PO TB12: 150.0000 mg | ORAL_TABLET | Freq: Two times a day (BID) | ORAL | 1 refills | Status: DC
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
## 2020-01-25 ENCOUNTER — Encounter: Payer: Self-pay | Admitting: Registered Nurse

## 2020-01-25 ENCOUNTER — Other Ambulatory Visit: Payer: Self-pay

## 2020-01-25 ENCOUNTER — Encounter (HOSPITAL_BASED_OUTPATIENT_CLINIC_OR_DEPARTMENT_OTHER): Payer: Medicare Other | Admitting: Registered Nurse

## 2020-01-25 VITALS — BP 113/76 | HR 70 | Temp 98.9°F | Ht 63.0 in | Wt 145.4 lb

## 2020-01-25 DIAGNOSIS — Z79891 Long term (current) use of opiate analgesic: Secondary | ICD-10-CM

## 2020-01-25 DIAGNOSIS — M545 Low back pain: Secondary | ICD-10-CM | POA: Diagnosis not present

## 2020-01-25 DIAGNOSIS — G894 Chronic pain syndrome: Secondary | ICD-10-CM | POA: Diagnosis not present

## 2020-01-25 DIAGNOSIS — M961 Postlaminectomy syndrome, not elsewhere classified: Secondary | ICD-10-CM

## 2020-01-25 DIAGNOSIS — Z5181 Encounter for therapeutic drug level monitoring: Secondary | ICD-10-CM | POA: Diagnosis not present

## 2020-01-25 DIAGNOSIS — G8929 Other chronic pain: Secondary | ICD-10-CM

## 2020-01-25 MED ORDER — MORPHINE SULFATE ER 30 MG PO TBCR: 30.0000 mg | EXTENDED_RELEASE_TABLET | Freq: Three times a day (TID) | ORAL | 0 refills | Status: DC
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
## 2020-02-08 ENCOUNTER — Other Ambulatory Visit: Payer: Self-pay | Admitting: Infectious Diseases

## 2020-02-08 DIAGNOSIS — Z1231 Encounter for screening mammogram for malignant neoplasm of breast: Secondary | ICD-10-CM

## 2020-02-15 ENCOUNTER — Other Ambulatory Visit: Payer: Self-pay

## 2020-02-15 ENCOUNTER — Ambulatory Visit
Admission: RE | Admit: 2020-02-15 | Discharge: 2020-02-15 | Disposition: A | Discharge status: Home / Self Care | Payer: Medicare Other | Source: Ambulatory Visit | Attending: Infectious Diseases | Admitting: Infectious Diseases

## 2020-02-15 DIAGNOSIS — Z1231 Encounter for screening mammogram for malignant neoplasm of breast: Secondary | ICD-10-CM

## 2020-02-25 ENCOUNTER — Encounter: Payer: Medicare Other | Admitting: Registered Nurse

## 2020-02-26 ENCOUNTER — Encounter: Payer: Medicare Other | Attending: Registered Nurse | Admitting: Registered Nurse

## 2020-02-26 ENCOUNTER — Encounter: Payer: Self-pay | Admitting: Registered Nurse

## 2020-02-26 ENCOUNTER — Other Ambulatory Visit: Payer: Self-pay

## 2020-02-26 VITALS — BP 117/73 | HR 75 | Temp 97.5°F | Ht 63.0 in | Wt 144.6 lb

## 2020-02-26 DIAGNOSIS — G629 Polyneuropathy, unspecified: Secondary | ICD-10-CM | POA: Diagnosis not present

## 2020-02-26 DIAGNOSIS — F329 Major depressive disorder, single episode, unspecified: Secondary | ICD-10-CM | POA: Diagnosis not present

## 2020-02-26 DIAGNOSIS — M545 Low back pain: Secondary | ICD-10-CM

## 2020-02-26 DIAGNOSIS — Z87891 Personal history of nicotine dependence: Secondary | ICD-10-CM | POA: Insufficient documentation

## 2020-02-26 DIAGNOSIS — Z981 Arthrodesis status: Secondary | ICD-10-CM | POA: Insufficient documentation

## 2020-02-26 DIAGNOSIS — E785 Hyperlipidemia, unspecified: Secondary | ICD-10-CM | POA: Diagnosis not present

## 2020-02-26 DIAGNOSIS — Z5181 Encounter for therapeutic drug level monitoring: Secondary | ICD-10-CM

## 2020-02-26 DIAGNOSIS — M797 Fibromyalgia: Secondary | ICD-10-CM | POA: Insufficient documentation

## 2020-02-26 DIAGNOSIS — Z79891 Long term (current) use of opiate analgesic: Secondary | ICD-10-CM

## 2020-02-26 DIAGNOSIS — G8929 Other chronic pain: Secondary | ICD-10-CM | POA: Diagnosis present

## 2020-02-26 DIAGNOSIS — M5412 Radiculopathy, cervical region: Secondary | ICD-10-CM

## 2020-02-26 DIAGNOSIS — G894 Chronic pain syndrome: Secondary | ICD-10-CM | POA: Diagnosis not present

## 2020-02-26 DIAGNOSIS — M1990 Unspecified osteoarthritis, unspecified site: Secondary | ICD-10-CM | POA: Insufficient documentation

## 2020-02-26 DIAGNOSIS — M961 Postlaminectomy syndrome, not elsewhere classified: Secondary | ICD-10-CM

## 2020-02-26 DIAGNOSIS — M542 Cervicalgia: Secondary | ICD-10-CM

## 2020-02-26 DIAGNOSIS — J45909 Unspecified asthma, uncomplicated: Secondary | ICD-10-CM | POA: Diagnosis not present

## 2020-02-26 MED ORDER — MORPHINE SULFATE ER 30 MG PO TBCR: 30.0000 mg | EXTENDED_RELEASE_TABLET | Freq: Three times a day (TID) | ORAL | 0 refills | Status: DC

## 2020-02-26 MED ORDER — TRAMADOL HCL 50 MG PO TABS: 50.0000 mg | ORAL_TABLET | Freq: Three times a day (TID) | ORAL | 2 refills | Status: DC | PRN

## 2020-02-26 MED ORDER — TOPIRAMATE 50 MG PO TABS: 50.0000 mg | ORAL_TABLET | Freq: Two times a day (BID) | ORAL | 3 refills | Status: DC

## 2020-02-26 MED ORDER — TIZANIDINE HCL 4 MG PO TABS: 4.0000 mg | ORAL_TABLET | Freq: Two times a day (BID) | ORAL | 5 refills | Status: DC
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
## 2020-03-27 ENCOUNTER — Encounter: Payer: Medicare Other | Attending: Registered Nurse | Admitting: Registered Nurse

## 2020-03-27 ENCOUNTER — Encounter: Payer: Self-pay | Admitting: Registered Nurse

## 2020-03-27 ENCOUNTER — Other Ambulatory Visit: Payer: Self-pay

## 2020-03-27 VITALS — Ht 64.0 in | Wt 142.0 lb

## 2020-03-27 DIAGNOSIS — M797 Fibromyalgia: Secondary | ICD-10-CM | POA: Insufficient documentation

## 2020-03-27 DIAGNOSIS — M1990 Unspecified osteoarthritis, unspecified site: Secondary | ICD-10-CM | POA: Insufficient documentation

## 2020-03-27 DIAGNOSIS — G629 Polyneuropathy, unspecified: Secondary | ICD-10-CM | POA: Insufficient documentation

## 2020-03-27 DIAGNOSIS — G894 Chronic pain syndrome: Secondary | ICD-10-CM | POA: Diagnosis not present

## 2020-03-27 DIAGNOSIS — G8929 Other chronic pain: Secondary | ICD-10-CM

## 2020-03-27 DIAGNOSIS — F329 Major depressive disorder, single episode, unspecified: Secondary | ICD-10-CM | POA: Insufficient documentation

## 2020-03-27 DIAGNOSIS — M961 Postlaminectomy syndrome, not elsewhere classified: Secondary | ICD-10-CM

## 2020-03-27 DIAGNOSIS — Z87891 Personal history of nicotine dependence: Secondary | ICD-10-CM | POA: Insufficient documentation

## 2020-03-27 DIAGNOSIS — Z5181 Encounter for therapeutic drug level monitoring: Secondary | ICD-10-CM | POA: Insufficient documentation

## 2020-03-27 DIAGNOSIS — Z79891 Long term (current) use of opiate analgesic: Secondary | ICD-10-CM | POA: Insufficient documentation

## 2020-03-27 DIAGNOSIS — M545 Low back pain, unspecified: Secondary | ICD-10-CM

## 2020-03-27 DIAGNOSIS — Z981 Arthrodesis status: Secondary | ICD-10-CM | POA: Insufficient documentation

## 2020-03-27 DIAGNOSIS — J45909 Unspecified asthma, uncomplicated: Secondary | ICD-10-CM | POA: Insufficient documentation

## 2020-03-27 DIAGNOSIS — E785 Hyperlipidemia, unspecified: Secondary | ICD-10-CM | POA: Insufficient documentation

## 2020-03-27 MED ORDER — MORPHINE SULFATE ER 30 MG PO TBCR: 30.0000 mg | EXTENDED_RELEASE_TABLET | Freq: Three times a day (TID) | ORAL | 0 refills | Status: DC
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
## 2020-03-31 ENCOUNTER — Other Ambulatory Visit: Payer: Self-pay | Admitting: Psychiatry

## 2020-03-31 DIAGNOSIS — F3162 Bipolar disorder, current episode mixed, moderate: Secondary | ICD-10-CM

## 2020-03-31 NOTE — Telephone Encounter (Signed)
pt called states she need refill on risperdal  she ran out on saturday and pharmacy closes at 6:00

## 2020-04-01 NOTE — Telephone Encounter (Signed)
I have sent risperidone to pharmacy.

## 2020-04-21 ENCOUNTER — Telehealth (INDEPENDENT_AMBULATORY_CARE_PROVIDER_SITE_OTHER): Payer: Medicare Other | Admitting: Psychiatry

## 2020-04-21 ENCOUNTER — Encounter: Payer: Self-pay | Admitting: Psychiatry

## 2020-04-21 ENCOUNTER — Other Ambulatory Visit: Payer: Self-pay

## 2020-04-21 DIAGNOSIS — F4310 Post-traumatic stress disorder, unspecified: Secondary | ICD-10-CM | POA: Diagnosis not present

## 2020-04-21 DIAGNOSIS — F3178 Bipolar disorder, in full remission, most recent episode mixed: Secondary | ICD-10-CM | POA: Diagnosis not present

## 2020-04-21 MED ORDER — BUPROPION HCL ER (SR) 150 MG PO TB12: 150.0000 mg | ORAL_TABLET | Freq: Two times a day (BID) | ORAL | 1 refills | Status: DC
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
## 2020-04-21 NOTE — Patient Instructions (Signed)
Serotonin Syndrome °Serotonin is a chemical in your body (neurotransmitter) that helps to control several functions, such as: °· Brain and nerve cell function. °· Mood and emotions. °· Memory. °· Eating. °· Sleeping. °· Sexual activity. °· Stress response. °Having too much serotonin in your body can cause serotonin syndrome. This condition can be harmful to your brain and nerve cells. This can be a life-threatening condition. °What are the causes? °This condition may be caused by taking medicines or drugs that increase the level of serotonin in your body, such as: °· Antidepressant medicines. °· Migraine medicines. °· Certain pain medicines. °· Certain drugs, including ecstasy, LSD, cocaine, and amphetamines. °· Over-the-counter cough or cold medicines that contain dextromethorphan. °· Certain herbal supplements, including St. John's wort, ginseng, and nutmeg. °This condition usually occurs when you take these medicines or drugs in combination, but it can also happen with a high dose of a single medicine or drug. °What increases the risk? °You are more likely to develop this condition if: °· You just started taking a medicine or drug that increases the level of serotonin in the body. °· You recently increased the dose of a medicine or drug that increases the level of serotonin in the body. °· You take more than one medicine or drug that increases the level of serotonin in the body. °What are the signs or symptoms? °Symptoms of this condition usually start within several hours of taking a medicine or drug. Symptoms may be mild or severe. Mild symptoms include: °· Sweating. °· Restlessness or agitation. °· Muscle twitching or stiffness. °· Rapid heart rate. °· Nausea and vomiting. °· Diarrhea. °· Headache. °· Shivering or goose bumps. °· Confusion. °Severe symptoms include: °· Irregular heartbeat. °· Seizures. °· Loss of consciousness. °· High fever. °How is this diagnosed? °This condition may be diagnosed based  on: °· Your medical history.  °· A physical exam. °· Your prior use of drugs and medicines. °· Blood or urine tests. These may be used to rule out other causes of your symptoms. °How is this treated? °The treatment for this condition depends on the severity of your symptoms. °· For mild cases, stopping the medicine or drug that caused your condition is usually all that is needed. °· For moderate to severe cases, treatment in a hospital may be needed to prevent or manage life-threatening symptoms. This may include medicines to control your symptoms, IV fluids, interventions to support your breathing, and treatments to control your body temperature. °Follow these instructions at home: °Medicines ° °· Take over-the-counter and prescription medicines only as told by your health care provider. This is important. °· Check with your health care provider before you start taking any new prescriptions, over-the-counter medicines, herbs, or supplements. °· Avoid combining any medicines that can cause this condition to occur. °Lifestyle ° °· Maintain a healthy lifestyle. °? Eat a healthy diet that includes plenty of vegetables, fruits, whole grains, low-fat dairy products, and lean protein. Do not eat a lot of foods that are high in fat, added sugars, or salt. °? Get the right amount and quality of sleep. Most adults need 7-9 hours of sleep each night. °? Make time to exercise, even if it is only for short periods of time. Most adults should exercise for at least 150 minutes each week. °? Do not drink alcohol. °? Do not use illegal drugs, and do not take medicines for reasons other than they are prescribed. °General instructions °· Do not use any products that contain nicotine   or tobacco, such as cigarettes and e-cigarettes. If you need help quitting, ask your health care provider. °· Keep all follow-up visits as told by your health care provider. This is important. °Contact a health care provider if: °· Your symptoms do not  improve or they get worse. °Get help right away if you: °· Have worsening confusion, severe headache, chest pain, high fever, seizures, or loss of consciousness. °· Experience serious side effects of medicine, such as swelling of your face, lips, tongue, or throat. °· Have serious thoughts about hurting yourself or others. °These symptoms may represent a serious problem that is an emergency. Do not wait to see if the symptoms will go away. Get medical help right away. Call your local emergency services (911 in the [place] Do not drive yourself to the hospital. °If you ever feel like you may hurt yourself or others, or have thoughts about taking your own life, get help right away. You can go to your nearest emergency department or call: °· Your local emergency services (911 in the [place] °· ·A suicide crisis helpline, such as the National Suicide Prevention Lifeline at 1-800-273-8255. This is open 24 hours a day. °Summary °· Serotonin is a brain chemical that helps to regulate the nervous system. High levels of serotonin in the body can cause serotonin syndrome, which is a very dangerous condition. °· This condition may be caused by taking medicines or drugs that increase the level of serotonin in your body. °· Treatment depends on the severity of your symptoms. For mild cases, stopping the medicine or drug that caused your condition is usually all that is needed. °· Check with your health care provider before you start taking any new prescriptions, over-the-counter medicines, herbs, or supplements. °This information is not intended to replace advice given to you by your health care provider. Make sure you discuss any questions you have with your health care provider. °Document Revised: 09/23/2017 Document Reviewed: 09/23/2017 °Elsevier Patient Education © 2020 [employer] ° °

## 2020-04-24 ENCOUNTER — Encounter: Payer: Medicare Other | Admitting: Registered Nurse

## 2020-04-25 ENCOUNTER — Other Ambulatory Visit: Payer: Self-pay

## 2020-04-25 ENCOUNTER — Encounter: Payer: Medicare Other | Attending: Registered Nurse | Admitting: Registered Nurse

## 2020-04-25 DIAGNOSIS — E785 Hyperlipidemia, unspecified: Secondary | ICD-10-CM | POA: Insufficient documentation

## 2020-04-25 DIAGNOSIS — M961 Postlaminectomy syndrome, not elsewhere classified: Secondary | ICD-10-CM

## 2020-04-25 DIAGNOSIS — G894 Chronic pain syndrome: Secondary | ICD-10-CM | POA: Diagnosis not present

## 2020-04-25 DIAGNOSIS — Z87891 Personal history of nicotine dependence: Secondary | ICD-10-CM | POA: Insufficient documentation

## 2020-04-25 DIAGNOSIS — F329 Major depressive disorder, single episode, unspecified: Secondary | ICD-10-CM | POA: Diagnosis not present

## 2020-04-25 DIAGNOSIS — Z5181 Encounter for therapeutic drug level monitoring: Secondary | ICD-10-CM | POA: Diagnosis not present

## 2020-04-25 DIAGNOSIS — G629 Polyneuropathy, unspecified: Secondary | ICD-10-CM | POA: Diagnosis not present

## 2020-04-25 DIAGNOSIS — M1990 Unspecified osteoarthritis, unspecified site: Secondary | ICD-10-CM | POA: Diagnosis not present

## 2020-04-25 DIAGNOSIS — Z981 Arthrodesis status: Secondary | ICD-10-CM | POA: Diagnosis not present

## 2020-04-25 DIAGNOSIS — M545 Low back pain: Secondary | ICD-10-CM | POA: Diagnosis not present

## 2020-04-25 DIAGNOSIS — Z79891 Long term (current) use of opiate analgesic: Secondary | ICD-10-CM | POA: Diagnosis present

## 2020-04-25 DIAGNOSIS — G8929 Other chronic pain: Secondary | ICD-10-CM

## 2020-04-25 DIAGNOSIS — M797 Fibromyalgia: Secondary | ICD-10-CM

## 2020-04-25 DIAGNOSIS — M5412 Radiculopathy, cervical region: Secondary | ICD-10-CM

## 2020-04-25 DIAGNOSIS — M542 Cervicalgia: Secondary | ICD-10-CM

## 2020-04-25 DIAGNOSIS — J45909 Unspecified asthma, uncomplicated: Secondary | ICD-10-CM | POA: Diagnosis not present

## 2020-04-25 MED ORDER — MORPHINE SULFATE ER 30 MG PO TBCR: 30.0000 mg | EXTENDED_RELEASE_TABLET | Freq: Three times a day (TID) | ORAL | 0 refills | Status: DC

## 2020-04-25 MED ORDER — TRAMADOL HCL 50 MG PO TABS: 50.0000 mg | ORAL_TABLET | Freq: Three times a day (TID) | ORAL | 2 refills | Status: DC | PRN
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
## 2020-04-30 ENCOUNTER — Other Ambulatory Visit: Payer: Self-pay | Admitting: Otolaryngology

## 2020-04-30 DIAGNOSIS — H9311 Tinnitus, right ear: Secondary | ICD-10-CM

## 2020-05-20 ENCOUNTER — Ambulatory Visit
Admission: RE | Admit: 2020-05-20 | Discharge: 2020-05-20 | Disposition: A | Discharge status: Home / Self Care | Payer: Medicare Other | Source: Ambulatory Visit | Attending: Otolaryngology | Admitting: Otolaryngology

## 2020-05-20 ENCOUNTER — Other Ambulatory Visit: Payer: Self-pay

## 2020-05-20 DIAGNOSIS — H9311 Tinnitus, right ear: Secondary | ICD-10-CM | POA: Insufficient documentation

## 2020-05-20 MED ORDER — GADOBUTROL 1 MMOL/ML IV SOLN
6.0000 mL | Freq: Once | INTRAVENOUS | Status: AC | PRN
  Administered 2020-05-20: 6 mL via INTRAVENOUS

## 2020-05-21 ENCOUNTER — Other Ambulatory Visit: Payer: Self-pay | Admitting: Otolaryngology

## 2020-05-21 DIAGNOSIS — H9311 Tinnitus, right ear: Secondary | ICD-10-CM

## 2020-05-21 DIAGNOSIS — R42 Dizziness and giddiness: Secondary | ICD-10-CM

## 2020-05-23 ENCOUNTER — Encounter: Payer: Self-pay | Admitting: Registered Nurse

## 2020-05-23 ENCOUNTER — Other Ambulatory Visit: Payer: Self-pay

## 2020-05-23 ENCOUNTER — Encounter: Payer: Medicare Other | Attending: Registered Nurse | Admitting: Registered Nurse

## 2020-05-23 VITALS — BP 109/72 | HR 69 | Temp 98.5°F | Ht 64.0 in | Wt 144.6 lb

## 2020-05-23 DIAGNOSIS — F329 Major depressive disorder, single episode, unspecified: Secondary | ICD-10-CM | POA: Diagnosis not present

## 2020-05-23 DIAGNOSIS — M545 Low back pain, unspecified: Secondary | ICD-10-CM

## 2020-05-23 DIAGNOSIS — G629 Polyneuropathy, unspecified: Secondary | ICD-10-CM | POA: Diagnosis not present

## 2020-05-23 DIAGNOSIS — M1990 Unspecified osteoarthritis, unspecified site: Secondary | ICD-10-CM | POA: Insufficient documentation

## 2020-05-23 DIAGNOSIS — Z87891 Personal history of nicotine dependence: Secondary | ICD-10-CM | POA: Diagnosis not present

## 2020-05-23 DIAGNOSIS — G894 Chronic pain syndrome: Secondary | ICD-10-CM | POA: Insufficient documentation

## 2020-05-23 DIAGNOSIS — M961 Postlaminectomy syndrome, not elsewhere classified: Secondary | ICD-10-CM | POA: Diagnosis not present

## 2020-05-23 DIAGNOSIS — E785 Hyperlipidemia, unspecified: Secondary | ICD-10-CM | POA: Diagnosis not present

## 2020-05-23 DIAGNOSIS — M797 Fibromyalgia: Secondary | ICD-10-CM | POA: Diagnosis not present

## 2020-05-23 DIAGNOSIS — Z79891 Long term (current) use of opiate analgesic: Secondary | ICD-10-CM | POA: Diagnosis present

## 2020-05-23 DIAGNOSIS — Z981 Arthrodesis status: Secondary | ICD-10-CM | POA: Insufficient documentation

## 2020-05-23 DIAGNOSIS — Z5181 Encounter for therapeutic drug level monitoring: Secondary | ICD-10-CM | POA: Insufficient documentation

## 2020-05-23 DIAGNOSIS — M542 Cervicalgia: Secondary | ICD-10-CM

## 2020-05-23 DIAGNOSIS — G43001 Migraine without aura, not intractable, with status migrainosus: Secondary | ICD-10-CM

## 2020-05-23 DIAGNOSIS — J45909 Unspecified asthma, uncomplicated: Secondary | ICD-10-CM | POA: Diagnosis not present

## 2020-05-23 DIAGNOSIS — G8929 Other chronic pain: Secondary | ICD-10-CM

## 2020-05-23 MED ORDER — MORPHINE SULFATE ER 30 MG PO TBCR: 30.0000 mg | EXTENDED_RELEASE_TABLET | Freq: Three times a day (TID) | ORAL | 0 refills | Status: DC

## 2020-05-23 MED ORDER — TRAMADOL HCL 50 MG PO TABS: 50.0000 mg | ORAL_TABLET | Freq: Three times a day (TID) | ORAL | 2 refills | Status: DC | PRN

## 2020-05-23 MED ORDER — TOPIRAMATE 50 MG PO TABS: 50.0000 mg | ORAL_TABLET | Freq: Two times a day (BID) | ORAL | 3 refills | Status: DC
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
## 2020-05-29 ENCOUNTER — Ambulatory Visit
Admission: RE | Admit: 2020-05-29 | Discharge: 2020-05-29 | Disposition: A | Discharge status: Home / Self Care | Payer: Medicare Other | Source: Ambulatory Visit | Attending: Otolaryngology | Admitting: Otolaryngology

## 2020-05-29 ENCOUNTER — Other Ambulatory Visit: Payer: Self-pay

## 2020-05-29 DIAGNOSIS — H9311 Tinnitus, right ear: Secondary | ICD-10-CM | POA: Insufficient documentation

## 2020-05-29 DIAGNOSIS — R42 Dizziness and giddiness: Secondary | ICD-10-CM | POA: Insufficient documentation

## 2020-05-29 MED ORDER — IOHEXOL 350 MG/ML SOLN
75.0000 mL | Freq: Once | INTRAVENOUS | Status: AC | PRN
  Administered 2020-05-29: 75 mL via INTRAVENOUS

## 2020-06-04 ENCOUNTER — Other Ambulatory Visit (HOSPITAL_COMMUNITY): Payer: Self-pay | Admitting: Interventional Radiology

## 2020-06-04 DIAGNOSIS — H9311 Tinnitus, right ear: Secondary | ICD-10-CM

## 2020-06-06 ENCOUNTER — Other Ambulatory Visit: Payer: Self-pay | Admitting: Radiology

## 2020-06-09 ENCOUNTER — Ambulatory Visit (HOSPITAL_COMMUNITY)
Admission: RE | Admit: 2020-06-09 | Discharge: 2020-06-09 | Disposition: A | Discharge status: Home / Self Care | Payer: Medicare Other | Source: Ambulatory Visit | Attending: Interventional Radiology | Admitting: Interventional Radiology

## 2020-06-09 ENCOUNTER — Other Ambulatory Visit: Payer: Self-pay

## 2020-06-09 ENCOUNTER — Other Ambulatory Visit (HOSPITAL_COMMUNITY): Payer: Self-pay | Admitting: Interventional Radiology

## 2020-06-09 DIAGNOSIS — H838X1 Other specified diseases of right inner ear: Secondary | ICD-10-CM | POA: Diagnosis not present

## 2020-06-09 DIAGNOSIS — H9311 Tinnitus, right ear: Secondary | ICD-10-CM | POA: Diagnosis present

## 2020-06-09 DIAGNOSIS — K219 Gastro-esophageal reflux disease without esophagitis: Secondary | ICD-10-CM | POA: Insufficient documentation

## 2020-06-09 DIAGNOSIS — F319 Bipolar disorder, unspecified: Secondary | ICD-10-CM | POA: Insufficient documentation

## 2020-06-09 DIAGNOSIS — M1990 Unspecified osteoarthritis, unspecified site: Secondary | ICD-10-CM | POA: Insufficient documentation

## 2020-06-09 DIAGNOSIS — Z888 Allergy status to other drugs, medicaments and biological substances status: Secondary | ICD-10-CM | POA: Diagnosis not present

## 2020-06-09 DIAGNOSIS — M797 Fibromyalgia: Secondary | ICD-10-CM | POA: Insufficient documentation

## 2020-06-09 DIAGNOSIS — Z90710 Acquired absence of both cervix and uterus: Secondary | ICD-10-CM | POA: Diagnosis not present

## 2020-06-09 DIAGNOSIS — Z87891 Personal history of nicotine dependence: Secondary | ICD-10-CM | POA: Insufficient documentation

## 2020-06-09 DIAGNOSIS — E785 Hyperlipidemia, unspecified: Secondary | ICD-10-CM | POA: Insufficient documentation

## 2020-06-09 DIAGNOSIS — Z79899 Other long term (current) drug therapy: Secondary | ICD-10-CM | POA: Insufficient documentation

## 2020-06-09 DIAGNOSIS — G894 Chronic pain syndrome: Secondary | ICD-10-CM | POA: Insufficient documentation

## 2020-06-09 DIAGNOSIS — Z886 Allergy status to analgesic agent status: Secondary | ICD-10-CM | POA: Insufficient documentation

## 2020-06-09 DIAGNOSIS — J45909 Unspecified asthma, uncomplicated: Secondary | ICD-10-CM | POA: Diagnosis not present

## 2020-06-09 DIAGNOSIS — Z8249 Family history of ischemic heart disease and other diseases of the circulatory system: Secondary | ICD-10-CM | POA: Insufficient documentation

## 2020-06-09 DIAGNOSIS — Z882 Allergy status to sulfonamides status: Secondary | ICD-10-CM | POA: Diagnosis not present

## 2020-06-09 HISTORY — PX: IR US GUIDE VASC ACCESS RIGHT: IMG2390

## 2020-06-09 HISTORY — PX: IR ANGIO INTRA EXTRACRAN SEL COM CAROTID INNOMINATE BILAT MOD SED: IMG5360

## 2020-06-09 HISTORY — PX: IR ANGIO VERTEBRAL SEL VERTEBRAL BILAT MOD SED: IMG5369

## 2020-06-09 LAB — BASIC METABOLIC PANEL
Anion gap: 8 (ref 5–15)
BUN: 16 mg/dL (ref 6–20)
CO2: 24 mmol/L (ref 22–32)
Calcium: 9 mg/dL (ref 8.9–10.3)
Chloride: 109 mmol/L (ref 98–111)
Creatinine, Ser: 1.08 mg/dL — ABNORMAL HIGH (ref 0.44–1.00)
GFR, Estimated: 58 mL/min — ABNORMAL LOW (ref >60)
Glucose, Bld: 90 mg/dL (ref 70–99)
Potassium: 3.2 mmol/L — ABNORMAL LOW (ref 3.5–5.1)
Sodium: 141 mmol/L (ref 135–145)

## 2020-06-09 LAB — CBC
HCT: 38.1 % (ref 36.0–46.0)
Hemoglobin: 12.1 g/dL (ref 12.0–15.0)
MCH: 29.3 pg (ref 26.0–34.0)
MCHC: 31.8 g/dL (ref 30.0–36.0)
MCV: 92.3 fL (ref 80.0–100.0)
Platelets: 265 10*3/uL (ref 150–400)
RBC: 4.13 MIL/uL (ref 3.87–5.11)
RDW: 13 % (ref 11.5–15.5)
WBC: 6.1 10*3/uL (ref 4.0–10.5)
nRBC: 0 % (ref 0.0–0.2)

## 2020-06-09 LAB — APTT: aPTT: 27 seconds (ref 24–36)

## 2020-06-09 LAB — PROTIME-INR
INR: 1.1 (ref 0.8–1.2)
Prothrombin Time: 13.3 seconds (ref 11.4–15.2)

## 2020-06-09 MED ORDER — LIDOCAINE HCL (PF) 1 % IJ SOLN
INTRAMUSCULAR | Status: AC
  Filled 2020-06-09: qty 30

## 2020-06-09 MED ORDER — VERAPAMIL HCL 2.5 MG/ML IV SOLN
INTRAVENOUS | Status: AC
  Filled 2020-06-09: qty 2

## 2020-06-09 MED ORDER — MIDAZOLAM HCL 2 MG/2ML IJ SOLN
INTRAMUSCULAR | Status: AC
  Filled 2020-06-09: qty 2

## 2020-06-09 MED ORDER — NITROGLYCERIN 1 MG/10 ML FOR IR/CATH LAB
INTRA_ARTERIAL | Status: AC | PRN
  Administered 2020-06-09: 200 ug via INTRA_ARTERIAL

## 2020-06-09 MED ORDER — POTASSIUM CHLORIDE 10 MEQ/100ML IV SOLN
10.0000 meq | INTRAVENOUS | Status: AC
  Administered 2020-06-09: 10 meq via INTRAVENOUS
  Filled 2020-06-09 (×4): qty 100

## 2020-06-09 MED ORDER — FENTANYL CITRATE (PF) 100 MCG/2ML IJ SOLN
INTRAMUSCULAR | Status: AC | PRN
  Administered 2020-06-09: 12.5 ug via INTRAVENOUS

## 2020-06-09 MED ORDER — HEPARIN SODIUM (PORCINE) 1000 UNIT/ML IJ SOLN
INTRAMUSCULAR | Status: AC | PRN
  Administered 2020-06-09: 2000 [IU] via INTRA_ARTERIAL

## 2020-06-09 MED ORDER — SODIUM CHLORIDE 0.9 % IV BOLUS
250.0000 mL | Freq: Once | INTRAVENOUS | Status: AC
  Administered 2020-06-09: 250 mL via INTRAVENOUS

## 2020-06-09 MED ORDER — VERAPAMIL HCL 2.5 MG/ML IV SOLN: INTRA_ARTERIAL | Status: AC | PRN

## 2020-06-09 MED ORDER — SODIUM CHLORIDE 0.9 % IV SOLN: INTRAVENOUS | Status: AC

## 2020-06-09 MED ORDER — SODIUM CHLORIDE 0.9 % IV SOLN: INTRAVENOUS | Status: DC

## 2020-06-09 MED ORDER — NITROGLYCERIN 1 MG/10 ML FOR IR/CATH LAB
INTRA_ARTERIAL | Status: AC
  Filled 2020-06-09: qty 10

## 2020-06-09 MED ORDER — FENTANYL CITRATE (PF) 100 MCG/2ML IJ SOLN
INTRAMUSCULAR | Status: AC
  Filled 2020-06-09: qty 2

## 2020-06-09 MED ORDER — IOHEXOL 300 MG/ML  SOLN
150.0000 mL | Freq: Once | INTRAMUSCULAR | Status: AC | PRN
  Administered 2020-06-09: 75 mL via INTRA_ARTERIAL

## 2020-06-09 MED ORDER — HEPARIN SODIUM (PORCINE) 1000 UNIT/ML IJ SOLN
INTRAMUSCULAR | Status: AC
  Filled 2020-06-09: qty 1

## 2020-06-09 MED ORDER — MIDAZOLAM HCL 2 MG/2ML IJ SOLN
INTRAMUSCULAR | Status: AC | PRN
  Administered 2020-06-09: 0.5 mg via INTRAVENOUS

## 2020-06-09 NOTE — Progress Notes (Signed)
Assumed care from [facility].  PA called and stated no need to give the rest of ordered potassium since half of one bag given.  Instructed pt to drink orange juice at home per Dr. Larey Seat.

## 2020-06-09 NOTE — Discharge Instructions (Signed)
Radial Site Care  This sheet gives you information about how to care for yourself after your procedure. Your health care provider may also give you more specific instructions. If you have problems or questions, contact your health care provider. What can I expect after the procedure? After the procedure, it is common to have:  Bruising and tenderness at the catheter insertion area. Follow these instructions at home: Medicines  Take over-the-counter and prescription medicines only as told by your health care provider. Insertion site care  Follow instructions from your health care provider about how to take care of your insertion site. Make sure you: ? Wash your hands with soap and water before you change your bandage (dressing). If soap and water are not available, use hand sanitizer. ? Change your dressing as told by your health care provider. ? Leave stitches (sutures), skin glue, or adhesive strips in place. These skin closures may need to stay in place for 2 weeks or longer. If adhesive strip edges start to loosen and curl up, you may trim the loose edges. Do not remove adhesive strips completely unless your health care provider tells you to do that.  Check your insertion site every day for signs of infection. Check for: ? Redness, swelling, or pain. ? Fluid or blood. ? Pus or a bad smell. ? Warmth.  Do not take baths, swim, or use a hot tub until your health care provider approves.  You may shower 24-48 hours after the procedure, or as directed by your health care provider. ? Remove the dressing and gently wash the site with plain soap and water. ? Pat the area dry with a clean towel. ? Do not rub the site. That could cause bleeding.  Do not apply powder or lotion to the site. Activity   For 24 hours after the procedure, or as directed by your health care provider: ? Do not flex or bend the affected arm. ? Do not push or pull heavy objects with the affected arm. ? Do not  drive yourself home from the hospital or clinic. You may drive 24 hours after the procedure unless your health care provider tells you not to. ? Do not operate machinery or power tools.  Do not lift anything that is heavier than 10 lb (4.5 kg), or the limit that you are told, until your health care provider says that it is safe.  Ask your health care provider when it is okay to: ? Return to work or school. ? Resume usual physical activities or sports. ? Resume sexual activity. General instructions  If the catheter site starts to bleed, raise your arm and put firm pressure on the site. If the bleeding does not stop, get help right away. This is a medical emergency.  If you went home on the same day as your procedure, a responsible adult should be with you for the first 24 hours after you arrive home.  Keep all follow-up visits as told by your health care provider. This is important. Contact a health care provider if:  You have a fever.  You have redness, swelling, or yellow drainage around your insertion site. Get help right away if:  You have unusual pain at the radial site.  The catheter insertion area swells very fast.  The insertion area is bleeding, and the bleeding does not stop when you hold steady pressure on the area.  Your arm or hand becomes pale, cool, tingly, or numb. These symptoms may represent a serious problem   that is an emergency. Do not wait to see if the symptoms will go away. Get medical help right away. Call your local emergency services (911 in the [place] Do not drive yourself to the hospital. Summary  After the procedure, it is common to have bruising and tenderness at the site.  Follow instructions from your health care provider about how to take care of your radial site wound. Check the wound every day for signs of infection.  Do not lift anything that is heavier than 10 lb (4.5 kg), or the limit that you are told, until your health care provider says  that it is safe. This information is not intended to replace advice given to you by your health care provider. Make sure you discuss any questions you have with your health care provider. Document Revised: 09/21/2017 Document Reviewed: 09/21/2017 Elsevier Patient Education  2020 [employer]  

## 2020-06-09 NOTE — Procedures (Addendum)
S/P 4 vessel cerebral arteriogram RT rad approach. Findings. 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

## 2020-06-09 NOTE — Progress Notes (Deleted)
Pt c/o of burning PIV, potassuim infusion rate changed from  100cc/hr to 50 cc/hr then 25cc/hr at
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
## 2020-06-09 NOTE — Sedation Documentation (Signed)
Patient transported to short stay via stretcher. Attached to monitor once in room. Wrist assessed with bedside RN. No drainage noted from site. Intact pulses.

## 2020-06-09 NOTE — Progress Notes (Addendum)
Pt c/o burning to PIV with potassium infusion rate decreased still with c/o burning.  Carollee Herter, PA notified  Will continue to monitor

## 2020-06-12 ENCOUNTER — Other Ambulatory Visit (HOSPITAL_COMMUNITY): Payer: Self-pay | Admitting: Interventional Radiology

## 2020-06-12 ENCOUNTER — Telehealth (HOSPITAL_COMMUNITY): Payer: Self-pay | Admitting: Radiology

## 2020-06-12 DIAGNOSIS — H9311 Tinnitus, right ear: Secondary | ICD-10-CM

## 2020-06-12 NOTE — Telephone Encounter (Signed)
Called pt, left VM for her to call to schedule consult with Deveshwar to discuss recent angio for tinnitus. JM

## 2020-06-18 ENCOUNTER — Encounter: Payer: Medicare Other | Attending: Registered Nurse | Admitting: Registered Nurse

## 2020-06-18 ENCOUNTER — Encounter: Payer: Self-pay | Admitting: Registered Nurse

## 2020-06-18 ENCOUNTER — Other Ambulatory Visit: Payer: Self-pay

## 2020-06-18 VITALS — BP 103/68 | HR 68 | Temp 98.1°F | Ht 64.0 in | Wt 146.4 lb

## 2020-06-18 DIAGNOSIS — Z79891 Long term (current) use of opiate analgesic: Secondary | ICD-10-CM

## 2020-06-18 DIAGNOSIS — M545 Low back pain, unspecified: Secondary | ICD-10-CM | POA: Insufficient documentation

## 2020-06-18 DIAGNOSIS — G8929 Other chronic pain: Secondary | ICD-10-CM | POA: Diagnosis present

## 2020-06-18 DIAGNOSIS — G894 Chronic pain syndrome: Secondary | ICD-10-CM | POA: Insufficient documentation

## 2020-06-18 DIAGNOSIS — M62838 Other muscle spasm: Secondary | ICD-10-CM | POA: Insufficient documentation

## 2020-06-18 DIAGNOSIS — M2550 Pain in unspecified joint: Secondary | ICD-10-CM | POA: Insufficient documentation

## 2020-06-18 DIAGNOSIS — M542 Cervicalgia: Secondary | ICD-10-CM

## 2020-06-18 DIAGNOSIS — M797 Fibromyalgia: Secondary | ICD-10-CM | POA: Insufficient documentation

## 2020-06-18 DIAGNOSIS — M961 Postlaminectomy syndrome, not elsewhere classified: Secondary | ICD-10-CM | POA: Insufficient documentation

## 2020-06-18 DIAGNOSIS — Z5181 Encounter for therapeutic drug level monitoring: Secondary | ICD-10-CM | POA: Insufficient documentation

## 2020-06-18 MED ORDER — MORPHINE SULFATE ER 30 MG PO TBCR
30.0000 mg | EXTENDED_RELEASE_TABLET | Freq: Three times a day (TID) | ORAL | 0 refills | Status: DC
Start: 2020-06-18 — End: 2020-07-18

## 2020-06-18 MED ORDER — TIZANIDINE HCL 4 MG PO TABS
4.0000 mg | ORAL_TABLET | Freq: Two times a day (BID) | ORAL | 5 refills | Status: DC
Start: 2020-06-18 — End: 2020-11-10
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
## 2020-06-19 ENCOUNTER — Telehealth (HOSPITAL_COMMUNITY): Payer: Self-pay | Admitting: Radiology

## 2020-06-19 ENCOUNTER — Encounter: Payer: Medicare Other | Admitting: Registered Nurse

## 2020-06-19 NOTE — Telephone Encounter (Signed)
Called pt, left VM for her to call back to schedule consult with Deveshwar to discuss tx. JM

## 2020-06-23 ENCOUNTER — Other Ambulatory Visit: Payer: Self-pay | Admitting: Psychiatry

## 2020-06-23 DIAGNOSIS — F3162 Bipolar disorder, current episode mixed, moderate: Secondary | ICD-10-CM

## 2020-06-27 ENCOUNTER — Ambulatory Visit (HOSPITAL_COMMUNITY): Admission: RE | Admit: 2020-06-27 | Payer: Medicare Other | Source: Ambulatory Visit

## 2020-06-30 ENCOUNTER — Telehealth: Payer: Self-pay

## 2020-06-30 NOTE — Telephone Encounter (Signed)
Appointment - Paitent called requesting an appointment with a new therapist.  Requests this be set up for with a female therapist and an afternoon appointment if available and to be called back with the time.

## 2020-07-01 NOTE — Telephone Encounter (Signed)
Called pt she wanted to see Dr. Elna Breslow. So I made and app

## 2020-07-04 ENCOUNTER — Ambulatory Visit (HOSPITAL_COMMUNITY): Admission: RE | Admit: 2020-07-04 | Payer: Medicare Other | Source: Ambulatory Visit

## 2020-07-15 ENCOUNTER — Encounter: Payer: Self-pay | Admitting: Psychiatry

## 2020-07-15 ENCOUNTER — Telehealth (INDEPENDENT_AMBULATORY_CARE_PROVIDER_SITE_OTHER): Payer: Medicare Other | Admitting: Psychiatry

## 2020-07-15 ENCOUNTER — Other Ambulatory Visit: Payer: Self-pay

## 2020-07-15 DIAGNOSIS — F3131 Bipolar disorder, current episode depressed, mild: Secondary | ICD-10-CM

## 2020-07-15 DIAGNOSIS — F4310 Post-traumatic stress disorder, unspecified: Secondary | ICD-10-CM | POA: Diagnosis not present

## 2020-07-15 MED ORDER — LAMOTRIGINE 100 MG PO TABS: 50.0000 mg | ORAL_TABLET | Freq: Two times a day (BID) | ORAL | 0 refills | Status: DC
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
## 2020-07-18 ENCOUNTER — Ambulatory Visit (HOSPITAL_COMMUNITY): Admission: RE | Admit: 2020-07-18 | Payer: Medicare Other | Source: Ambulatory Visit

## 2020-07-18 ENCOUNTER — Encounter: Payer: Medicare Other | Attending: Registered Nurse | Admitting: Registered Nurse

## 2020-07-18 ENCOUNTER — Other Ambulatory Visit: Payer: Self-pay

## 2020-07-18 ENCOUNTER — Encounter: Payer: Self-pay | Admitting: Registered Nurse

## 2020-07-18 VITALS — BP 102/68 | HR 66 | Temp 98.1°F | Ht 64.0 in | Wt 148.8 lb

## 2020-07-18 DIAGNOSIS — Z79891 Long term (current) use of opiate analgesic: Secondary | ICD-10-CM | POA: Insufficient documentation

## 2020-07-18 DIAGNOSIS — M545 Low back pain, unspecified: Secondary | ICD-10-CM

## 2020-07-18 DIAGNOSIS — M542 Cervicalgia: Secondary | ICD-10-CM | POA: Diagnosis present

## 2020-07-18 DIAGNOSIS — G894 Chronic pain syndrome: Secondary | ICD-10-CM | POA: Insufficient documentation

## 2020-07-18 DIAGNOSIS — M797 Fibromyalgia: Secondary | ICD-10-CM | POA: Insufficient documentation

## 2020-07-18 DIAGNOSIS — M2550 Pain in unspecified joint: Secondary | ICD-10-CM | POA: Diagnosis present

## 2020-07-18 DIAGNOSIS — M961 Postlaminectomy syndrome, not elsewhere classified: Secondary | ICD-10-CM | POA: Diagnosis present

## 2020-07-18 DIAGNOSIS — G8929 Other chronic pain: Secondary | ICD-10-CM | POA: Diagnosis present

## 2020-07-18 MED ORDER — MORPHINE SULFATE ER 30 MG PO TBCR
30.0000 mg | EXTENDED_RELEASE_TABLET | Freq: Three times a day (TID) | ORAL | 0 refills | Status: DC
Start: 2020-07-18 — End: 2020-08-15
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
## 2020-07-28 ENCOUNTER — Ambulatory Visit: Payer: Medicare Other | Admitting: Licensed Clinical Social Worker

## 2020-07-28 ENCOUNTER — Other Ambulatory Visit: Payer: Self-pay

## 2020-08-08 ENCOUNTER — Ambulatory Visit (HOSPITAL_COMMUNITY)
Admission: RE | Admit: 2020-08-08 | Discharge: 2020-08-08 | Disposition: A | Discharge status: Home / Self Care | Payer: Medicare Other | Source: Ambulatory Visit | Attending: Interventional Radiology | Admitting: Interventional Radiology

## 2020-08-08 ENCOUNTER — Other Ambulatory Visit: Payer: Self-pay

## 2020-08-08 DIAGNOSIS — H9311 Tinnitus, right ear: Secondary | ICD-10-CM

## 2020-08-10 HISTORY — PX: IR RADIOLOGIST EVAL & MGMT: IMG5224

## 2020-08-11 ENCOUNTER — Ambulatory Visit: Payer: Medicare Other | Admitting: Licensed Clinical Social Worker

## 2020-08-14 ENCOUNTER — Encounter: Payer: Self-pay | Admitting: Psychiatry

## 2020-08-14 ENCOUNTER — Other Ambulatory Visit: Payer: Self-pay

## 2020-08-14 ENCOUNTER — Telehealth (INDEPENDENT_AMBULATORY_CARE_PROVIDER_SITE_OTHER): Payer: Medicare Other | Admitting: Psychiatry

## 2020-08-14 DIAGNOSIS — F3175 Bipolar disorder, in partial remission, most recent episode depressed: Secondary | ICD-10-CM | POA: Diagnosis not present

## 2020-08-14 DIAGNOSIS — F4310 Post-traumatic stress disorder, unspecified: Secondary | ICD-10-CM

## 2020-08-14 DIAGNOSIS — F3131 Bipolar disorder, current episode depressed, mild: Secondary | ICD-10-CM | POA: Insufficient documentation
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
## 2020-08-15 ENCOUNTER — Other Ambulatory Visit: Payer: Self-pay

## 2020-08-15 ENCOUNTER — Encounter: Payer: Medicare Other | Attending: Registered Nurse | Admitting: Registered Nurse

## 2020-08-15 ENCOUNTER — Encounter: Payer: Self-pay | Admitting: Registered Nurse

## 2020-08-15 VITALS — BP 105/71 | HR 65 | Ht 64.0 in | Wt 149.0 lb

## 2020-08-15 DIAGNOSIS — G43001 Migraine without aura, not intractable, with status migrainosus: Secondary | ICD-10-CM | POA: Diagnosis present

## 2020-08-15 DIAGNOSIS — M25511 Pain in right shoulder: Secondary | ICD-10-CM | POA: Diagnosis present

## 2020-08-15 DIAGNOSIS — M797 Fibromyalgia: Secondary | ICD-10-CM | POA: Insufficient documentation

## 2020-08-15 DIAGNOSIS — G8929 Other chronic pain: Secondary | ICD-10-CM | POA: Diagnosis present

## 2020-08-15 DIAGNOSIS — M62838 Other muscle spasm: Secondary | ICD-10-CM | POA: Insufficient documentation

## 2020-08-15 DIAGNOSIS — Z5181 Encounter for therapeutic drug level monitoring: Secondary | ICD-10-CM | POA: Diagnosis present

## 2020-08-15 DIAGNOSIS — M25561 Pain in right knee: Secondary | ICD-10-CM | POA: Insufficient documentation

## 2020-08-15 DIAGNOSIS — M2550 Pain in unspecified joint: Secondary | ICD-10-CM | POA: Insufficient documentation

## 2020-08-15 DIAGNOSIS — M542 Cervicalgia: Secondary | ICD-10-CM | POA: Diagnosis present

## 2020-08-15 DIAGNOSIS — M25512 Pain in left shoulder: Secondary | ICD-10-CM | POA: Insufficient documentation

## 2020-08-15 DIAGNOSIS — M25562 Pain in left knee: Secondary | ICD-10-CM | POA: Insufficient documentation

## 2020-08-15 DIAGNOSIS — M546 Pain in thoracic spine: Secondary | ICD-10-CM | POA: Diagnosis present

## 2020-08-15 DIAGNOSIS — G894 Chronic pain syndrome: Secondary | ICD-10-CM | POA: Diagnosis present

## 2020-08-15 DIAGNOSIS — M961 Postlaminectomy syndrome, not elsewhere classified: Secondary | ICD-10-CM | POA: Insufficient documentation

## 2020-08-15 DIAGNOSIS — M545 Low back pain, unspecified: Secondary | ICD-10-CM | POA: Diagnosis present

## 2020-08-15 DIAGNOSIS — Z79891 Long term (current) use of opiate analgesic: Secondary | ICD-10-CM | POA: Insufficient documentation

## 2020-08-15 MED ORDER — TOPIRAMATE 50 MG PO TABS
50.0000 mg | ORAL_TABLET | Freq: Two times a day (BID) | ORAL | 3 refills | Status: DC
Start: 2020-08-15 — End: 2020-11-10

## 2020-08-15 MED ORDER — TRAMADOL HCL 50 MG PO TABS
50.0000 mg | ORAL_TABLET | Freq: Three times a day (TID) | ORAL | 2 refills | Status: DC | PRN
Start: 2020-08-15 — End: 2020-10-13

## 2020-08-15 MED ORDER — MORPHINE SULFATE ER 30 MG PO TBCR
30.0000 mg | EXTENDED_RELEASE_TABLET | Freq: Three times a day (TID) | ORAL | 0 refills | Status: DC
Start: 2020-08-15 — End: 2020-09-11
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
## 2020-08-20 ENCOUNTER — Other Ambulatory Visit (HOSPITAL_COMMUNITY): Payer: Self-pay | Admitting: Interventional Radiology

## 2020-08-20 ENCOUNTER — Telehealth (HOSPITAL_COMMUNITY): Payer: Self-pay | Admitting: Radiology

## 2020-08-20 DIAGNOSIS — H9311 Tinnitus, right ear: Secondary | ICD-10-CM

## 2020-08-20 NOTE — Telephone Encounter (Signed)
Called pt, left VM for her to call to schedule CT temporal bones wo. JM

## 2020-08-26 ENCOUNTER — Ambulatory Visit (HOSPITAL_COMMUNITY)
Admission: EM | Admit: 2020-08-26 | Discharge: 2020-08-26 | Disposition: A | Discharge status: Home / Self Care | Payer: Medicare Other | Attending: Family Medicine | Admitting: Family Medicine

## 2020-08-26 ENCOUNTER — Other Ambulatory Visit: Payer: Self-pay

## 2020-08-26 ENCOUNTER — Encounter (HOSPITAL_COMMUNITY): Payer: Self-pay

## 2020-08-26 DIAGNOSIS — Z79899 Other long term (current) drug therapy: Secondary | ICD-10-CM | POA: Diagnosis not present

## 2020-08-26 DIAGNOSIS — Z882 Allergy status to sulfonamides status: Secondary | ICD-10-CM | POA: Insufficient documentation

## 2020-08-26 DIAGNOSIS — Z888 Allergy status to other drugs, medicaments and biological substances status: Secondary | ICD-10-CM | POA: Insufficient documentation

## 2020-08-26 DIAGNOSIS — J441 Chronic obstructive pulmonary disease with (acute) exacerbation: Secondary | ICD-10-CM | POA: Insufficient documentation

## 2020-08-26 DIAGNOSIS — U071 COVID-19: Secondary | ICD-10-CM | POA: Diagnosis not present

## 2020-08-26 DIAGNOSIS — Z87891 Personal history of nicotine dependence: Secondary | ICD-10-CM | POA: Diagnosis not present

## 2020-08-26 DIAGNOSIS — Z886 Allergy status to analgesic agent status: Secondary | ICD-10-CM | POA: Diagnosis not present

## 2020-08-26 DIAGNOSIS — J069 Acute upper respiratory infection, unspecified: Secondary | ICD-10-CM

## 2020-08-26 DIAGNOSIS — R519 Headache, unspecified: Secondary | ICD-10-CM | POA: Diagnosis present

## 2020-08-26 MED ORDER — PREDNISONE 20 MG PO TABS: 40.0000 mg | ORAL_TABLET | Freq: Every day | ORAL | 0 refills | Status: DC

## 2020-08-26 MED ORDER — AZITHROMYCIN 250 MG PO TABS: ORAL_TABLET | ORAL | 0 refills | Status: DC

## 2020-08-26 NOTE — ED Provider Notes (Signed)
[place]    CSN: [id number] Arrival date & time: 08/26/20  1352      History   Chief Complaint Chief Complaint  Patient presents with   Headache   Fatigue   Otalgia   Loss of Taste    HPI Cindy Pratt is a 54 y.o. female.   Here today with 1 week history of headache, sinus pain and pressure, b/l ear pain, loss of taste, wheezing, chest tightness, mild SOB. Denies known fever, body aches, CP, abdominal pain, N/V/D. Taking typical allergy and inhaler regimen, hx of allergic rhinitis and COPD. Not taking anything additionally for sxs. Has not been tested yet for COVID. UTD on vaccines. No known sick contacts.      Past Medical History:  Diagnosis Date   Anxiety    Asthma    Belchings    Bipolar disorder ([place])    Chronic pain syndrome    in pain clinic   Degenerative joint disease    Depression    Disturbance of skin sensation    Fibromyalgia    GERD (gastroesophageal reflux disease)    History of kidney stones    Hyperlipidemia    Lumbar post-laminectomy syndrome    Lumbosacral neuritis    Osteoarthritis    Other chronic postoperative pain    Sciatica     Patient Active Problem List   Diagnosis Date Noted   Bipolar 1 disorder, depressed, mild ([place]) 08/14/2020   Bipolar disorder, in full remission, most recent episode mixed ([place]) 04/21/2020   At risk for long QT syndrome 07/05/2019   Bipolar 1 disorder, mixed, moderate ([place]) 03/08/2019   PTSD (post-traumatic stress disorder) 03/08/2019   Congestion of nasal sinus 09/20/2018   Sensorineural hearing loss (SNHL), bilateral 06/22/2017   Eustachian tube dysfunction, bilateral 06/22/2017   Bipolar disorder with depression ([place]) 12/22/2016   Chronic obstructive pulmonary disease, unspecified ([place]) 02/24/2015   Postlaminectomy syndrome, lumbar region 10/21/2011   Thoracic or lumbosacral neuritis or radiculitis, unspecified 10/21/2011   Chronic pain syndrome  10/21/2011   Sternoclavicular joint disorder 10/21/2011   DIABETES MELLITUS, TYPE II 08/14/2009   FLANK PAIN, RIGHT 08/05/2009   HYPERHIDROSIS 05/26/2009   SWELLING, MASS, OR LUMP IN CHEST 05/26/2009   ABDOMINAL TENDERNESS, RIGHT UPPER QUADRANT 05/26/2009   BACK PAIN 02/18/2009   ANXIETY 07/22/2008   DEPRESSION 07/22/2008   PERIPHERAL NEUROPATHY 07/22/2008   ALLERGIC RHINITIS 07/22/2008   ASTHMA 07/22/2008   LOW BACK PAIN 07/22/2008   Fibromyalgia syndrome 07/22/2008   Nonspecific (abnormal) findings on radiological and other examination of body structure 07/22/2008   NEPHROLITHIASIS, HX OF 07/22/2008   CHEST XRAY, ABNORMAL 07/22/2008   Asthma 07/22/2008    Past Surgical History:  Procedure Laterality Date   ABDOMINAL HYSTERECTOMY     BACK SURGERY  1997, 2003   CYSTECTOMY     DIAGNOSTIC LAPAROSCOPY     EXTRACORPOREAL SHOCK WAVE LITHOTRIPSY Right 05/04/2018   Procedure: RIGHT EXTRACORPOREAL SHOCK WAVE LITHOTRIPSY (ESWL) WITH MAC;  Surgeon: Kathie Rhodes, MD;  [facility];  Service: Urology;  Laterality: Right;   IR ANGIO INTRA EXTRACRAN SEL COM CAROTID INNOMINATE BILAT MOD SED  06/09/2020   IR ANGIO VERTEBRAL SEL VERTEBRAL BILAT MOD SED  06/09/2020   IR RADIOLOGIST EVAL & MGMT  08/10/2020   IR US GUIDE VASC ACCESS RIGHT  06/09/2020   WRIST ARTHROPLASTY Left    WRIST ARTHROSCOPY WITH DEBRIDEMENT Right 11/13/2015   Procedure: RIGHT WRIST ARTHROSCOPY WITH DEBRIDEMENT;  Surgeon: Leanora Cover, MD;  Location: [place];  Service: Orthopedics;  Laterality: Right;    OB History   No obstetric history on file.      Home Medications    Prior to Admission medications   Medication Sig Start Date End Date Taking? Authorizing Provider  azithromycin (ZITHROMAX) 250 MG tablet Take 2 tabs day one, then 1 tab daily until complete 08/26/20  Yes Volney American, PA-C  predniSONE (DELTASONE) 20 MG tablet Take 2 tablets (40 mg total) by  mouth daily with breakfast. 08/26/20  Yes Volney American, PA-C  albuterol (VENTOLIN HFA) 108 (90 Base) MCG/ACT inhaler Inhale 2 puffs into the lungs every 6 (six) hours as needed for wheezing or shortness of breath.  07/04/19   [provider]  azelastine (ASTELIN) 0.1 % nasal spray Place 2 sprays into both nostrils daily. Use in each nostril as directed    [provider]  buPROPion (WELLBUTRIN SR) 150 MG 12 hr tablet Take 1 tablet (150 mg total) by mouth 2 (two) times daily. 04/21/20   Ursula Alert, MD  fluticasone (FLONASE) 50 MCG/ACT nasal spray Place 2 sprays into the nose daily.  09/20/18 06/06/20  [provider]  lamoTRIgine (LAMICTAL) 100 MG tablet Take 0.5 tablets (50 mg total) by mouth 2 (two) times daily. 07/15/20   Ursula Alert, MD  levocetirizine (XYZAL) 5 MG tablet Take 5 mg by mouth at bedtime.  09/20/18 06/06/20  [provider]  morphine (MS CONTIN) 30 MG 12 hr tablet Take 1 tablet (30 mg total) by mouth every 8 (eight) hours. 08/15/20   Bayard Hugger, NP  risperiDONE (RISPERDAL) 2 MG tablet TAKE 1 TABLET BY MOUTH EVERY NIGHT AT BEDTIME 06/23/20   Ursula Alert, MD  tiZANidine (ZANAFLEX) 4 MG tablet Take 1 tablet (4 mg total) by mouth 2 (two) times daily. 06/18/20   Bayard Hugger, NP  topiramate (TOPAMAX) 50 MG tablet Take 1 tablet (50 mg total) by mouth 2 (two) times daily. 08/15/20   Bayard Hugger, NP  traMADol (ULTRAM) 50 MG tablet Take 1 tablet (50 mg total) by mouth 3 (three) times daily as needed. 08/15/20   Bayard Hugger, NP    Family History Family History  Problem Relation Age of Onset   Hypertension Mother    Hypertension Father    Heart attack Father    Drug abuse Father    Alcohol abuse Father    Breast cancer Maternal Grandmother    Liver cancer Maternal Grandmother    Cancer Maternal Grandmother        breast cancer   Ovarian cancer Cousin    Cancer Cousin        1st c. maternal side/  cervical cancer   Cancer Cousin        1st c maternal/ colon cancer   Diabetes Maternal Grandfather        both sides   Alcohol abuse Paternal Grandfather    Drug abuse Paternal Grandfather     Social History Social History   Tobacco Use   Smoking status: Former Smoker    Packs/day: 1.00    Years: 20.00    Pack years: 20.00    Types: Cigarettes    Quit date: 07/31/2007    Years since quitting: 13.0   Smokeless tobacco: Never Used  Vaping Use   Vaping Use: Never used  Substance Use Topics   Alcohol use: No   Drug use: No     Allergies   Aspirin,  Nsaids, Sulfa antibiotics, Sulfonamide derivatives, Tolmetin, Cymbalta [duloxetine hcl], Duloxetine, Gabapentin, Other, and Pregabalin   Review of Systems Review of Systems PER HPI   Physical Exam Triage Vital Signs ED Triage Vitals  Enc Vitals Group     BP 08/26/20 1706 99/61     Pulse Rate 08/26/20 1706 61     Resp 08/26/20 1706 17     Temp 08/26/20 1706 98.4 F (36.9 C)     Temp Source 08/26/20 1706 Oral     SpO2 08/26/20 1706 99 %     Weight --      Height --      Head Circumference --      Peak Flow --      Pain Score 08/26/20 1704 5     Pain Loc --      Pain Edu? --      Excl. in [place]? --    No data found.  Updated Vital Signs BP 99/61 (BP Location: Right Arm)    Pulse 61    Temp 98.4 F (36.9 C) (Oral)    Resp 17    SpO2 99%   Visual Acuity Right Eye Distance:   Left Eye Distance:   Bilateral Distance:    Right Eye Near:   Left Eye Near:    Bilateral Near:     Physical Exam Vitals and nursing note reviewed.  Constitutional:      Appearance: Normal appearance. She is not ill-appearing.  HENT:     Head: Atraumatic.     Right Ear: Tympanic membrane normal.     Left Ear: Tympanic membrane normal.     Nose: Congestion present.     Mouth/Throat:     Mouth: Mucous membranes are moist.     Pharynx: Posterior oropharyngeal erythema present. No oropharyngeal exudate.  Eyes:      Extraocular Movements: Extraocular movements intact.     Conjunctiva/sclera: Conjunctivae normal.  Cardiovascular:     Rate and Rhythm: Normal rate and regular rhythm.     Heart sounds: Normal heart sounds.  Pulmonary:     Effort: Pulmonary effort is normal. No respiratory distress.     Breath sounds: Wheezing (diffuse) present. No rales.  Abdominal:     General: Bowel sounds are normal. There is no distension.     Palpations: Abdomen is soft.     Tenderness: There is no abdominal tenderness. There is no guarding.  Musculoskeletal:        General: Normal range of motion.     Cervical back: Normal range of motion and neck supple.  Skin:    General: Skin is warm and dry.  Neurological:     Mental Status: She is alert and oriented to person, place, and time.  Psychiatric:        Mood and Affect: Mood normal.        Thought Content: Thought content normal.        Judgment: Judgment normal.      UC Treatments / Results  Labs (all labs ordered are listed, but only abnormal results are displayed) Labs Reviewed  SARS CORONAVIRUS 2 (TAT 6-24 HRS)    EKG   Radiology No results found.  Procedures Procedures (including critical care time)  Medications Ordered in UC Medications - No data to display  Initial Impression / Assessment and Plan / UC Course  I have reviewed the triage vital signs and the nursing notes.  Pertinent labs & imaging results that were available during my care  of the patient were reviewed by me and considered in my medical decision making (see chart for details).     COVID pcr pending, vital signs very reassuring today. Will start prednisone burst for sinusitis and COPD exacerbation, zithromax sent in case worsening or not resolving over next few days. Discussed other supportive OTC medications and care recommendations. Return precautions given.   Final Clinical Impressions(s) / UC Diagnoses   Final diagnoses:  Upper respiratory tract infection,  unspecified type  COPD exacerbation [facility])   Discharge Instructions   None    ED Prescriptions    Medication Sig Dispense Auth. Provider   predniSONE (DELTASONE) 20 MG tablet Take 2 tablets (40 mg total) by mouth daily with breakfast. 10 tablet Volney American, PA-C   azithromycin (ZITHROMAX) 250 MG tablet Take 2 tabs day one, then 1 tab daily until complete 6 tablet Volney American, PA-C     PDMP not reviewed this encounter.   Volney American, [place] 08/26/20 1737

## 2020-08-26 NOTE — ED Triage Notes (Signed)
Pt presents with ongoing headache, congestion, bilateral ear pain, fatigue, and loss of taste since last week.

## 2020-08-27 LAB — SARS CORONAVIRUS 2 (TAT 6-24 HRS): SARS Coronavirus 2: POSITIVE — AB

## 2020-09-01 ENCOUNTER — Ambulatory Visit: Payer: Medicare Other | Admitting: Licensed Clinical Social Worker

## 2020-09-01 ENCOUNTER — Other Ambulatory Visit: Payer: Self-pay

## 2020-09-01 ENCOUNTER — Telehealth: Payer: Self-pay | Admitting: Licensed Clinical Social Worker

## 2020-09-01 NOTE — Telephone Encounter (Signed)
Therapist attempted to reach patient for today's virtual visit via text, email and phone call. Pt did not respond to any of these attempts. This is the second appointment with this therapist for assessment in which patient is unreachable.

## 2020-09-11 ENCOUNTER — Encounter: Payer: Medicare Other | Attending: Registered Nurse | Admitting: Registered Nurse

## 2020-09-11 ENCOUNTER — Other Ambulatory Visit: Payer: Self-pay | Admitting: Psychiatry

## 2020-09-11 ENCOUNTER — Encounter: Payer: Self-pay | Admitting: Registered Nurse

## 2020-09-11 ENCOUNTER — Other Ambulatory Visit: Payer: Self-pay

## 2020-09-11 VITALS — BP 107/70 | HR 63 | Temp 98.3°F | Ht 64.0 in | Wt 148.6 lb

## 2020-09-11 DIAGNOSIS — M79672 Pain in left foot: Secondary | ICD-10-CM | POA: Diagnosis present

## 2020-09-11 DIAGNOSIS — M797 Fibromyalgia: Secondary | ICD-10-CM | POA: Diagnosis present

## 2020-09-11 DIAGNOSIS — M542 Cervicalgia: Secondary | ICD-10-CM | POA: Diagnosis present

## 2020-09-11 DIAGNOSIS — M961 Postlaminectomy syndrome, not elsewhere classified: Secondary | ICD-10-CM | POA: Diagnosis present

## 2020-09-11 DIAGNOSIS — Z79891 Long term (current) use of opiate analgesic: Secondary | ICD-10-CM | POA: Diagnosis present

## 2020-09-11 DIAGNOSIS — G894 Chronic pain syndrome: Secondary | ICD-10-CM | POA: Diagnosis present

## 2020-09-11 DIAGNOSIS — M25512 Pain in left shoulder: Secondary | ICD-10-CM | POA: Insufficient documentation

## 2020-09-11 DIAGNOSIS — Z5181 Encounter for therapeutic drug level monitoring: Secondary | ICD-10-CM | POA: Diagnosis not present

## 2020-09-11 DIAGNOSIS — M7061 Trochanteric bursitis, right hip: Secondary | ICD-10-CM | POA: Diagnosis present

## 2020-09-11 DIAGNOSIS — M25561 Pain in right knee: Secondary | ICD-10-CM | POA: Diagnosis present

## 2020-09-11 DIAGNOSIS — M2550 Pain in unspecified joint: Secondary | ICD-10-CM

## 2020-09-11 DIAGNOSIS — M5412 Radiculopathy, cervical region: Secondary | ICD-10-CM

## 2020-09-11 DIAGNOSIS — F3162 Bipolar disorder, current episode mixed, moderate: Secondary | ICD-10-CM

## 2020-09-11 DIAGNOSIS — M25511 Pain in right shoulder: Secondary | ICD-10-CM | POA: Diagnosis present

## 2020-09-11 DIAGNOSIS — M545 Low back pain, unspecified: Secondary | ICD-10-CM | POA: Diagnosis present

## 2020-09-11 DIAGNOSIS — M62838 Other muscle spasm: Secondary | ICD-10-CM

## 2020-09-11 DIAGNOSIS — G8929 Other chronic pain: Secondary | ICD-10-CM | POA: Diagnosis present

## 2020-09-11 MED ORDER — MORPHINE SULFATE ER 30 MG PO TBCR
30.0000 mg | EXTENDED_RELEASE_TABLET | Freq: Three times a day (TID) | ORAL | 0 refills | Status: DC
Start: 2020-09-11 — End: 2020-10-13
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
## 2020-09-15 ENCOUNTER — Ambulatory Visit: Payer: Medicare Other | Admitting: Registered Nurse

## 2020-09-16 ENCOUNTER — Ambulatory Visit: Payer: Medicare Other | Admitting: Registered Nurse

## 2020-09-17 ENCOUNTER — Telehealth (INDEPENDENT_AMBULATORY_CARE_PROVIDER_SITE_OTHER): Payer: Medicare Other | Admitting: Psychiatry

## 2020-09-17 ENCOUNTER — Other Ambulatory Visit: Payer: Self-pay

## 2020-09-17 ENCOUNTER — Encounter: Payer: Self-pay | Admitting: Psychiatry

## 2020-09-17 DIAGNOSIS — F3176 Bipolar disorder, in full remission, most recent episode depressed: Secondary | ICD-10-CM | POA: Diagnosis not present

## 2020-09-17 DIAGNOSIS — F4310 Post-traumatic stress disorder, unspecified: Secondary | ICD-10-CM | POA: Diagnosis not present

## 2020-09-17 MED ORDER — LAMOTRIGINE 100 MG PO TABS: 50.0000 mg | ORAL_TABLET | Freq: Two times a day (BID) | ORAL | 0 refills | Status: DC

## 2020-09-17 MED ORDER — BUPROPION HCL ER (SR) 150 MG PO TB12: 150.0000 mg | ORAL_TABLET | Freq: Two times a day (BID) | ORAL | 1 refills | Status: DC
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
## 2020-09-18 LAB — DRUG TOX MONITOR 1 W/CONF, ORAL FLD
Amphetamines: NEGATIVE ng/mL (ref <10)
Barbiturates: NEGATIVE ng/mL (ref <10)
Benzodiazepines: NEGATIVE ng/mL (ref <0.50)
Buprenorphine: NEGATIVE ng/mL (ref <0.10)
Buprenorphine: NEGATIVE ng/mL (ref <0.10)
Cocaine: NEGATIVE ng/mL (ref <5.0)
Codeine: NEGATIVE ng/mL (ref <2.5)
Dihydrocodeine: NEGATIVE ng/mL (ref <2.5)
Fentanyl: NEGATIVE ng/mL (ref <0.10)
Heroin Metabolite: NEGATIVE ng/mL (ref <1.0)
Hydrocodone: NEGATIVE ng/mL (ref <2.5)
Hydromorphone: NEGATIVE ng/mL (ref <2.5)
MARIJUANA: NEGATIVE ng/mL (ref <2.5)
MDMA: NEGATIVE ng/mL (ref <10)
Meprobamate: NEGATIVE ng/mL (ref <2.5)
Methadone: NEGATIVE ng/mL (ref <5.0)
Morphine: 65 ng/mL — ABNORMAL HIGH (ref <2.5)
Naloxone: NEGATIVE ng/mL (ref <0.25)
Nicotine Metabolite: NEGATIVE ng/mL (ref <5.0)
Norbuprenorphine: NEGATIVE ng/mL (ref <0.50)
Norhydrocodone: NEGATIVE ng/mL (ref <2.5)
Noroxycodone: NEGATIVE ng/mL (ref <2.5)
Opiates: POSITIVE ng/mL — AB (ref <2.5)
Oxycodone: NEGATIVE ng/mL (ref <2.5)
Oxymorphone: NEGATIVE ng/mL (ref <2.5)
Phencyclidine: NEGATIVE ng/mL (ref <10)
Tapentadol: NEGATIVE ng/mL (ref <5.0)
Tramadol: >500 ng/mL — ABNORMAL HIGH (ref <5.0)
Tramadol: POSITIVE ng/mL — AB (ref <5.0)
Zolpidem: NEGATIVE ng/mL (ref <5.0)

## 2020-09-18 LAB — DRUG TOX ALC METAB W/CON, ORAL FLD: Alcohol Metabolite: NEGATIVE ng/mL (ref <25)

## 2020-09-29 ENCOUNTER — Encounter: Payer: Self-pay | Admitting: Licensed Clinical Social Worker

## 2020-09-29 ENCOUNTER — Ambulatory Visit (INDEPENDENT_AMBULATORY_CARE_PROVIDER_SITE_OTHER): Payer: Medicare Other | Admitting: Licensed Clinical Social Worker

## 2020-09-29 ENCOUNTER — Other Ambulatory Visit: Payer: Self-pay

## 2020-09-29 DIAGNOSIS — F3131 Bipolar disorder, current episode depressed, mild: Secondary | ICD-10-CM | POA: Diagnosis not present

## 2020-09-29 DIAGNOSIS — F4310 Post-traumatic stress disorder, unspecified: Secondary | ICD-10-CM | POA: Diagnosis not present
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
## 2020-10-11 ENCOUNTER — Emergency Department (HOSPITAL_COMMUNITY): Payer: Medicare Other

## 2020-10-11 ENCOUNTER — Encounter (HOSPITAL_COMMUNITY): Payer: Self-pay | Admitting: Student

## 2020-10-11 ENCOUNTER — Emergency Department (HOSPITAL_COMMUNITY)
Admission: EM | Admit: 2020-10-11 | Discharge: 2020-10-11 | Disposition: A | Discharge status: Home / Self Care | Payer: Medicare Other | Attending: Emergency Medicine | Admitting: Emergency Medicine

## 2020-10-11 DIAGNOSIS — J45909 Unspecified asthma, uncomplicated: Secondary | ICD-10-CM | POA: Diagnosis not present

## 2020-10-11 DIAGNOSIS — E119 Type 2 diabetes mellitus without complications: Secondary | ICD-10-CM | POA: Insufficient documentation

## 2020-10-11 DIAGNOSIS — W010XXA Fall on same level from slipping, tripping and stumbling without subsequent striking against object, initial encounter: Secondary | ICD-10-CM | POA: Insufficient documentation

## 2020-10-11 DIAGNOSIS — S0101XA Laceration without foreign body of scalp, initial encounter: Secondary | ICD-10-CM | POA: Diagnosis not present

## 2020-10-11 DIAGNOSIS — Y9301 Activity, walking, marching and hiking: Secondary | ICD-10-CM | POA: Insufficient documentation

## 2020-10-11 DIAGNOSIS — J449 Chronic obstructive pulmonary disease, unspecified: Secondary | ICD-10-CM | POA: Diagnosis not present

## 2020-10-11 DIAGNOSIS — M549 Dorsalgia, unspecified: Secondary | ICD-10-CM | POA: Diagnosis not present

## 2020-10-11 DIAGNOSIS — Z87891 Personal history of nicotine dependence: Secondary | ICD-10-CM | POA: Insufficient documentation

## 2020-10-11 DIAGNOSIS — Z7952 Long term (current) use of systemic steroids: Secondary | ICD-10-CM | POA: Insufficient documentation

## 2020-10-11 DIAGNOSIS — S0990XA Unspecified injury of head, initial encounter: Secondary | ICD-10-CM

## 2020-10-11 DIAGNOSIS — W19XXXA Unspecified fall, initial encounter: Secondary | ICD-10-CM

## 2020-10-11 DIAGNOSIS — M25551 Pain in right hip: Secondary | ICD-10-CM | POA: Insufficient documentation

## 2020-10-11 MED ORDER — LIDOCAINE-EPINEPHRINE-TETRACAINE (LET) TOPICAL GEL
3.0000 mL | Freq: Once | TOPICAL | Status: DC
  Filled 2020-10-11: qty 3

## 2020-10-11 NOTE — ED Provider Notes (Signed)
[facility]PART[facility] Provider Note   CSN: [id number] Arrival date & time: 10/11/20  0153     History Chief Complaint  Patient presents with  . Fall    Cindy Pratt is a 55 y.o. female with a hx of fibromyalgia, chronic pain syndrome, prior back surgery, and diabetes who presents to the emergency department status post fall shortly PTA with complaints of head, hip, and back pain.  Patient states that she was walking and tripped falling forward hitting her head on the PNO.  She did not lose consciousness.  States that she did not fall to her right side/lower back.  Her injuries resulted in a laceration to her scalp area.  Her pain is worse with movement.  No alleviating factors.  She has walked since the fall.  She denies prodromal lightheadedness, dizziness, chest pain, or dyspnea.  She denies neck pain, weakness, numbness, chest pain, or abdominal pain.  She is not anticoagulated.  She is unsure of her last tetanus.  HPI     Past Medical History:  Diagnosis Date  . Anxiety   . Asthma   . Belchings   . Bipolar disorder ([place])   . Chronic pain syndrome    in pain clinic  . Degenerative joint disease   . Depression   . Disturbance of skin sensation   . Fibromyalgia   . GERD (gastroesophageal reflux disease)   . History of kidney stones   . Hyperlipidemia   . Lumbar post-laminectomy syndrome   . Lumbosacral neuritis   . Osteoarthritis   . Other chronic postoperative pain   . Sciatica     Patient Active Problem List   Diagnosis Date Noted  . Bipolar 1 disorder, depressed, mild ([place]) 08/14/2020  . Bipolar disorder, in full remission, most recent episode mixed ([place]) 04/21/2020  . At risk for long QT syndrome 07/05/2019  . Bipolar 1 disorder, mixed, moderate ([place]) 03/08/2019  . PTSD (post-traumatic stress disorder) 03/08/2019  . Congestion of nasal sinus 09/20/2018  . Sensorineural hearing loss (SNHL), bilateral 06/22/2017  . Eustachian tube  dysfunction, bilateral 06/22/2017  . Bipolar disorder with depression ([place]) 12/22/2016  . Chronic obstructive pulmonary disease, unspecified ([place]) 02/24/2015  . Postlaminectomy syndrome, lumbar region 10/21/2011  . Thoracic or lumbosacral neuritis or radiculitis, unspecified 10/21/2011  . Chronic pain syndrome 10/21/2011  . Sternoclavicular joint disorder 10/21/2011  . DIABETES MELLITUS, TYPE II 08/14/2009  . FLANK PAIN, RIGHT 08/05/2009  . HYPERHIDROSIS 05/26/2009  . SWELLING, MASS, OR LUMP IN CHEST 05/26/2009  . ABDOMINAL TENDERNESS, RIGHT UPPER QUADRANT 05/26/2009  . BACK PAIN 02/18/2009  . ANXIETY 07/22/2008  . DEPRESSION 07/22/2008  . PERIPHERAL NEUROPATHY 07/22/2008  . ALLERGIC RHINITIS 07/22/2008  . ASTHMA 07/22/2008  . LOW BACK PAIN 07/22/2008  . Fibromyalgia syndrome 07/22/2008  . Nonspecific (abnormal) findings on radiological and other examination of body structure 07/22/2008  . NEPHROLITHIASIS, HX OF 07/22/2008  . CHEST XRAY, ABNORMAL 07/22/2008  . Asthma 07/22/2008    Past Surgical History:  Procedure Laterality Date  . ABDOMINAL HYSTERECTOMY    . BACK SURGERY  1997, 2003  . CYSTECTOMY    . DIAGNOSTIC LAPAROSCOPY    . EXTRACORPOREAL SHOCK WAVE LITHOTRIPSY Right 05/04/2018   Procedure: RIGHT EXTRACORPOREAL SHOCK WAVE LITHOTRIPSY (ESWL) WITH MAC;  Surgeon: Kathie Rhodes, MD;  Location: [facility];  Service: Urology;  Laterality: Right;  . IR ANGIO INTRA EXTRACRAN SEL COM CAROTID INNOMINATE BILAT MOD SED  06/09/2020  . IR ANGIO VERTEBRAL SEL  VERTEBRAL BILAT MOD SED  06/09/2020  . IR RADIOLOGIST EVAL & MGMT  08/10/2020  . IR US GUIDE VASC ACCESS RIGHT  06/09/2020  . WRIST ARTHROPLASTY Left   . WRIST ARTHROSCOPY WITH DEBRIDEMENT Right 11/13/2015   Procedure: RIGHT WRIST ARTHROSCOPY WITH DEBRIDEMENT;  Surgeon: Leanora Cover, MD;  Location: [facility];  Service: Orthopedics;  Laterality: Right;     OB History   No obstetric history on file.     Family  History  Problem Relation Age of Onset  . Hypertension Mother   . Hypertension Father   . Heart attack Father   . Drug abuse Father   . Alcohol abuse Father   . Breast cancer Maternal Grandmother   . Liver cancer Maternal Grandmother   . Cancer Maternal Grandmother        breast cancer  . Ovarian cancer Cousin   . Cancer Cousin        1st c. maternal side/ cervical cancer  . Cancer Cousin        1st c maternal/ colon cancer  . Diabetes Maternal Grandfather        both sides  . Alcohol abuse Paternal Grandfather   . Drug abuse Paternal Grandfather     Social History   Tobacco Use  . Smoking status: Former Smoker    Packs/day: 1.00    Years: 20.00    Pack years: 20.00    Types: Cigarettes    Quit date: 07/31/2007    Years since quitting: 13.2  . Smokeless tobacco: Never Used  Vaping Use  . Vaping Use: Never used  Substance Use Topics  . Alcohol use: No  . Drug use: No    Home Medications Prior to Admission medications   Medication Sig Start Date End Date Taking? Authorizing Provider  albuterol (VENTOLIN HFA) 108 (90 Base) MCG/ACT inhaler Inhale 2 puffs into the lungs every 6 (six) hours as needed for wheezing or shortness of breath.  07/04/19   [provider]  azelastine (ASTELIN) 0.1 % nasal spray Place 2 sprays into both nostrils daily. Use in each nostril as directed    [provider]  buPROPion (WELLBUTRIN SR) 150 MG 12 hr tablet Take 1 tablet (150 mg total) by mouth 2 (two) times daily. 09/17/20   Ursula Alert, MD  fluticasone (FLONASE) 50 MCG/ACT nasal spray Place 2 sprays into the nose daily.  09/20/18 06/06/20  [provider]  lamoTRIgine (LAMICTAL) 100 MG tablet Take 0.5 tablets (50 mg total) by mouth 2 (two) times daily. 09/17/20   Ursula Alert, MD  levocetirizine (XYZAL) 5 MG tablet Take 5 mg by mouth at bedtime.  09/20/18 06/06/20  [provider]  morphine (MS CONTIN) 30 MG 12 hr tablet Take 1 tablet (30 mg total) by  mouth every 8 (eight) hours. 09/11/20   Bayard Hugger, NP  risperiDONE (RISPERDAL) 2 MG tablet TAKE 1 TABLET BY MOUTH EVERY NIGHT AT BEDTIME 09/11/20   Ursula Alert, MD  tiZANidine (ZANAFLEX) 4 MG tablet Take 1 tablet (4 mg total) by mouth 2 (two) times daily. 06/18/20   Bayard Hugger, NP  topiramate (TOPAMAX) 50 MG tablet Take 1 tablet (50 mg total) by mouth 2 (two) times daily. 08/15/20   Bayard Hugger, NP  traMADol (ULTRAM) 50 MG tablet Take 1 tablet (50 mg total) by mouth 3 (three) times daily as needed. 08/15/20   Bayard Hugger, NP    Allergies    Aspirin, Nsaids, Sulfa  antibiotics, Sulfonamide derivatives, Tolmetin, Cymbalta [duloxetine hcl], Duloxetine, Gabapentin, Other, and Pregabalin  Review of Systems   Review of Systems  Constitutional: Negative for chills and fever.  Eyes: Negative for visual disturbance.  Respiratory: Negative for shortness of breath.   Cardiovascular: Negative for chest pain.  Gastrointestinal: Negative for abdominal pain and vomiting.  Musculoskeletal: Positive for arthralgias and back pain. Negative for neck pain.  Neurological: Positive for headaches. Negative for dizziness, syncope, weakness and numbness.  All other systems reviewed and are negative.   Physical Exam Updated Vital Signs BP 99/69 (BP Location: Right Arm)   Pulse 65   Temp 98 F (36.7 C) (Oral)   Resp 18   Ht _0  (1.626 m)   Wt 68 kg   SpO2 100%   BMI 25.75 kg/m   Physical Exam Vitals and nursing note reviewed.  Constitutional:      General: She is not in acute distress.    Appearance: She is well-developed and well-nourished.  HENT:     Head: Normocephalic. No raccoon eyes or Battle's sign.      Comments: No facial bony tenderness.     Right Ear: No hemotympanum.     Left Ear: No hemotympanum.     Mouth/Throat:     Mouth: Oropharynx is clear and moist.  Eyes:     General:        Right eye: No discharge.        Left eye: No discharge.     Extraocular  Movements: Extraocular movements intact and EOM normal.     Conjunctiva/sclera: Conjunctivae normal.     Pupils: Pupils are equal, round, and reactive to light.  Cardiovascular:     Rate and Rhythm: Normal rate and regular rhythm.     Heart sounds: No murmur heard.     Comments: 2+ symmetric radial and PT pulses bilaterally. Pulmonary:     Effort: No respiratory distress.     Breath sounds: Normal breath sounds. No wheezing or rales.  Chest:     Chest wall: No tenderness.  Abdominal:     General: There is no distension.     Palpations: Abdomen is soft.     Tenderness: There is no abdominal tenderness. There is no guarding or rebound.  Musculoskeletal:     Cervical back: Normal range of motion and neck supple. No tenderness. No spinous process tenderness.     Comments: No obvious deformities noted.  No significant open wounds to the extremities.  Upper extremities: Patient has full intact active range of motion to the bilateral upper extremities to the shoulders, elbows, wrist, and all digits.  She has no focal areas of bony tenderness. Back: Patient has a prior surgical scar to her lumbar spine region which is very well-healed.  She does have tenderness diffusely to the lower lumbar spine as well as the sacral spine.  No point/focal vertebral tenderness or palpable step-off. Lower extremities: Patient has full intact active range of motion to bilateral lower extremities to the hips, knees, ankles.  She is tender to the right lateral hip over the greater trochanter.  Otherwise nontender.  Skin:    General: Skin is warm and dry.     Findings: No rash.  Neurological:     Mental Status: She is alert.     Comments: Alert.  Clear speech.  CN III through XII grossly intact.  Sensation grossly intact bilateral upper and lower extremities.  5 out of 5 symmetric grip strength.  5  out of 5 strength with plantar dorsiflexion bilaterally.  Patient is ambulatory to the restroom on initial assessment.   Psychiatric:        Mood and Affect: Mood and affect normal.        Behavior: Behavior normal.     ED Results / Procedures / Treatments   Labs (all labs ordered are listed, but only abnormal results are displayed) Labs Reviewed - No data to display  EKG None  Radiology DG Lumbar Spine Complete  Result Date: 10/11/2020 CLINICAL DATA:  Back pain, fall at home. EXAM: LUMBAR SPINE - COMPLETE 4+ VIEW COMPARISON:  Lumbar radiograph 04/09/2019 FINDINGS: Posterior fusion L5-S1 with posterior rod and intrapedicular screws. Hardware is intact. Normal alignment. Vertebral body heights are normal. No acute fracture. Disc spaces are preserved. Large volume of stool throughout the colon. Sacroiliac joints are congruent. IMPRESSION: 1. No acute fracture or subluxation of the lumbar spine. 2. Postsurgical change without hardware complication. Electronically Signed   By: Keith Rake M.D.   On: 10/11/2020 03:04   DG Sacrum/Coccyx  Result Date: 10/11/2020 CLINICAL DATA:  Pain after fall at home. EXAM: SACRUM AND COCCYX - 2+ VIEW COMPARISON:  Reformats from abdominal CT 04/10/2018 FINDINGS: Cortical margins of the sacrum and coccyx are intact. There is no evidence of fracture or other focal bone lesions. The sacral ala are maintained. The sacroiliac joints are congruent. IMPRESSION: Negative radiographs of the sacrum and coccyx. Electronically Signed   By: Keith Rake M.D.   On: 10/11/2020 03:02   CT Head Wo Contrast  Result Date: 10/11/2020 CLINICAL DATA:  Head trauma, mod-severe Fall at home striking head on piano. EXAM: CT HEAD WITHOUT CONTRAST TECHNIQUE: Contiguous axial images were obtained from the base of the skull through the vertex without intravenous contrast. COMPARISON:  Brain MRI 05/20/2020, it CT 03/18/2017 FINDINGS: Brain: No intracranial hemorrhage, mass effect, or midline shift. White matter changes on prior MRI are not well appreciated by CT. No hydrocephalus. The basilar cisterns  are patent. No evidence of territorial infarct or acute ischemia. No extra-axial or intracranial fluid collection. Vascular: No hyperdense vessel. Skull: No fracture or focal lesion. Sinuses/Orbits: Minimal opacification of lower right mastoid air cells, unchanged. No acute findings. Other: None. IMPRESSION: No acute intracranial abnormality. No skull fracture. Electronically Signed   By: Keith Rake M.D.   On: 10/11/2020 02:40   DG Hip Unilat With Pelvis 2-3 Views Right  Result Date: 10/11/2020 CLINICAL DATA:  Fall, right hip pain. EXAM: DG HIP (WITH OR WITHOUT PELVIS) 2-3V RIGHT COMPARISON:  None. FINDINGS: There is no evidence of hip fracture or dislocation. There is no evidence of arthropathy or other focal bone abnormality. Incomplete evaluation of L5-S1 surgical changes. IMPRESSION: Negative. Electronically Signed   By: Iven Finn M.D.   On: 10/11/2020 05:59    Procedures .Marland KitchenLaceration Repair  Date/Time: 10/11/2020 5:00 AM Performed by: Amaryllis Dyke, PA-C Authorized by: Amaryllis Dyke, PA-C   Consent:    Consent obtained:  Verbal   Consent given by:  Patient   Risks, benefits, and alternatives were discussed: yes     Risks discussed:  Infection, pain, retained foreign body and poor cosmetic result   Alternatives discussed:  No treatment (Alternative of staples) Anesthesia:    Anesthesia method:  None Laceration details:    Location:  Scalp   Scalp location:  Frontal   Length (cm):  2   Depth (mm):  3 Pre-procedure details:    Preparation:  Patient was  prepped and draped in usual sterile fashion and imaging obtained to evaluate for foreign bodies Exploration:    Hemostasis achieved with:  Direct pressure Treatment:    Area cleansed with:  Chlorhexidine   Amount of cleaning:  Standard   Irrigation solution:  Sterile water   Irrigation method:  Syringe Skin repair:    Repair method:  Tissue adhesive (Dermabond with hair apposition  technique) Approximation:    Approximation:  Close Post-procedure details:    Procedure completion:  Tolerated well, no immediate complications     Medications Ordered in ED Medications - No data to display  ED Course  I have reviewed the triage vital signs and the nursing notes.  Pertinent labs & imaging results that were available during my care of the patient were reviewed by me and considered in my medical decision making (see chart for details).    MDM Rules/Calculators/A&P                         Patient presents to the ED with complaints of fall with head injury.  Nontoxic, vitals WNL, bp mildly soft but similar to prior on chart review   Additional history obtained:  Additional history obtained from chart review & nursing note review.  Last tetanus was in 2019 on chart review- up to date.   Imaging Studies ordered:  I ordered imaging studies which included CT head & x-rays of the L spine, sacral/coccygeal region & the R hip, I independently reviewed, formal radiology impression shows: no acute injuries.   Wound was cleansed, pressure irrigated, visualized in a bloodless field without evidence of foreign body.  Repaired with Dermabond and hair apposition technique.  Discussed wound care. No signs of serious head, neck, or back injury, CT head negative, no C-spine or T-spine tenderness, L-spine and sacral/coccygeal x-rays negative for fracture.  No chest or abdominal tenderness.  Right hip x-ray negative, neurovascular intact distally, patient is able to weight-bear on the right lower extremity therefore do not feel that CT is necessary at this time for further assessment.  She has analgesics at home.  Will discharge at this time. I discussed results, treatment plan, need for follow-up, and return precautions with the patient. Provided opportunity for questions, patient confirmed understanding and is in agreement with plan.   Portions of this note were generated with Geographical information systems officer. Dictation errors may occur despite best attempts at proofreading.  Final Clinical Impression(s) / ED Diagnoses Final diagnoses:  Fall, initial encounter  Injury of head, initial encounter  Laceration of scalp, initial encounter    Rx / DC Orders ED Discharge Orders    None       Amaryllis Dyke, PA-C 10/11/20 [place], [place], DO 10/11/20 [id number]

## 2020-10-11 NOTE — ED Notes (Signed)
Patient transported to X-ray 

## 2020-10-11 NOTE — Discharge Instructions (Addendum)
You were seen in the emergency department today for a fall.  Your CT of your head did not show a head bleed.  Your x-rays did not show fractures.  Your scalp wound was closed with Dermabond which is a skin glue.  Please keep the area clean and dry for the next 24 hours, after 24 hours you can get wet but do not soak it.  The skin glue will come off on its own.  Please take your pain medications at home as prescribed.  Please follow-up with your primary care provider within 1 week for reevaluation.  Return to the ER for new or worsening symptoms including but not limited to new pain, trouble breathing, chest pain, passing out, numbness or weakness in your arms or legs, loss of control of your bowel or bladder function, redness/pus from the wound on your scalp, blood in your urine, stool, or coughing up blood, or any other concerns.

## 2020-10-11 NOTE — ED Triage Notes (Signed)
Pt BIB EMS, fell at home, hit head on piano. c/o R hip pain, lower back pain, and head pain r/t laceration.   Denies LOC  Hx fibromyalgia and degen. Disc disorder, COPD, BPD, depression  EMS: 125/68 HR 66 RR 16 99% RA

## 2020-10-13 ENCOUNTER — Other Ambulatory Visit: Payer: Self-pay | Admitting: Psychiatry

## 2020-10-13 ENCOUNTER — Other Ambulatory Visit: Payer: Self-pay

## 2020-10-13 ENCOUNTER — Encounter: Payer: Medicare Other | Attending: Registered Nurse | Admitting: Registered Nurse

## 2020-10-13 ENCOUNTER — Other Ambulatory Visit: Payer: Self-pay | Admitting: Registered Nurse

## 2020-10-13 ENCOUNTER — Encounter: Payer: Self-pay | Admitting: Registered Nurse

## 2020-10-13 VITALS — BP 120/83 | HR 67 | Temp 98.2°F | Ht 64.0 in | Wt 152.4 lb

## 2020-10-13 DIAGNOSIS — Z79891 Long term (current) use of opiate analgesic: Secondary | ICD-10-CM | POA: Diagnosis not present

## 2020-10-13 DIAGNOSIS — G894 Chronic pain syndrome: Secondary | ICD-10-CM | POA: Insufficient documentation

## 2020-10-13 DIAGNOSIS — M542 Cervicalgia: Secondary | ICD-10-CM | POA: Diagnosis not present

## 2020-10-13 DIAGNOSIS — M797 Fibromyalgia: Secondary | ICD-10-CM | POA: Diagnosis not present

## 2020-10-13 DIAGNOSIS — M545 Low back pain, unspecified: Secondary | ICD-10-CM | POA: Diagnosis not present

## 2020-10-13 DIAGNOSIS — M5412 Radiculopathy, cervical region: Secondary | ICD-10-CM | POA: Insufficient documentation

## 2020-10-13 DIAGNOSIS — M25551 Pain in right hip: Secondary | ICD-10-CM | POA: Insufficient documentation

## 2020-10-13 DIAGNOSIS — M25561 Pain in right knee: Secondary | ICD-10-CM | POA: Insufficient documentation

## 2020-10-13 DIAGNOSIS — Z5181 Encounter for therapeutic drug level monitoring: Secondary | ICD-10-CM | POA: Diagnosis not present

## 2020-10-13 DIAGNOSIS — M62838 Other muscle spasm: Secondary | ICD-10-CM | POA: Insufficient documentation

## 2020-10-13 DIAGNOSIS — Y92009 Unspecified place in unspecified non-institutional (private) residence as the place of occurrence of the external cause: Secondary | ICD-10-CM | POA: Diagnosis not present

## 2020-10-13 DIAGNOSIS — F32A Depression, unspecified: Secondary | ICD-10-CM | POA: Insufficient documentation

## 2020-10-13 DIAGNOSIS — Z760 Encounter for issue of repeat prescription: Secondary | ICD-10-CM | POA: Diagnosis not present

## 2020-10-13 DIAGNOSIS — T402X5A Adverse effect of other opioids, initial encounter: Secondary | ICD-10-CM | POA: Diagnosis not present

## 2020-10-13 DIAGNOSIS — M7061 Trochanteric bursitis, right hip: Secondary | ICD-10-CM | POA: Insufficient documentation

## 2020-10-13 DIAGNOSIS — G8929 Other chronic pain: Secondary | ICD-10-CM

## 2020-10-13 DIAGNOSIS — M961 Postlaminectomy syndrome, not elsewhere classified: Secondary | ICD-10-CM | POA: Diagnosis not present

## 2020-10-13 DIAGNOSIS — M2550 Pain in unspecified joint: Secondary | ICD-10-CM | POA: Diagnosis not present

## 2020-10-13 DIAGNOSIS — Z87891 Personal history of nicotine dependence: Secondary | ICD-10-CM | POA: Diagnosis not present

## 2020-10-13 DIAGNOSIS — F3162 Bipolar disorder, current episode mixed, moderate: Secondary | ICD-10-CM

## 2020-10-13 DIAGNOSIS — W010XXA Fall on same level from slipping, tripping and stumbling without subsequent striking against object, initial encounter: Secondary | ICD-10-CM | POA: Insufficient documentation

## 2020-10-13 DIAGNOSIS — G43001 Migraine without aura, not intractable, with status migrainosus: Secondary | ICD-10-CM | POA: Diagnosis not present

## 2020-10-13 DIAGNOSIS — Z79899 Other long term (current) drug therapy: Secondary | ICD-10-CM | POA: Insufficient documentation

## 2020-10-13 DIAGNOSIS — K5903 Drug induced constipation: Secondary | ICD-10-CM | POA: Insufficient documentation

## 2020-10-13 DIAGNOSIS — W19XXXD Unspecified fall, subsequent encounter: Secondary | ICD-10-CM

## 2020-10-13 MED ORDER — TRAMADOL HCL 50 MG PO TABS: 50.0000 mg | ORAL_TABLET | Freq: Three times a day (TID) | ORAL | 2 refills | Status: DC | PRN

## 2020-10-13 MED ORDER — MORPHINE SULFATE ER 30 MG PO TBCR: 30.0000 mg | EXTENDED_RELEASE_TABLET | Freq: Three times a day (TID) | ORAL | 0 refills | Status: DC
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
## 2020-10-15 ENCOUNTER — Other Ambulatory Visit: Payer: Self-pay

## 2020-10-15 ENCOUNTER — Ambulatory Visit (INDEPENDENT_AMBULATORY_CARE_PROVIDER_SITE_OTHER): Payer: Medicare Other | Admitting: Licensed Clinical Social Worker

## 2020-10-15 ENCOUNTER — Encounter: Payer: Self-pay | Admitting: Licensed Clinical Social Worker

## 2020-10-15 DIAGNOSIS — F4310 Post-traumatic stress disorder, unspecified: Secondary | ICD-10-CM

## 2020-10-15 DIAGNOSIS — F3131 Bipolar disorder, current episode depressed, mild: Secondary | ICD-10-CM | POA: Diagnosis not present
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
## 2020-11-05 ENCOUNTER — Encounter: Payer: Self-pay | Admitting: Licensed Clinical Social Worker

## 2020-11-05 ENCOUNTER — Other Ambulatory Visit: Payer: Self-pay

## 2020-11-05 ENCOUNTER — Ambulatory Visit (INDEPENDENT_AMBULATORY_CARE_PROVIDER_SITE_OTHER): Payer: Medicare Other | Admitting: Licensed Clinical Social Worker

## 2020-11-05 DIAGNOSIS — F3131 Bipolar disorder, current episode depressed, mild: Secondary | ICD-10-CM

## 2020-11-05 DIAGNOSIS — F4310 Post-traumatic stress disorder, unspecified: Secondary | ICD-10-CM | POA: Diagnosis not present

## 2020-11-05 NOTE — Progress Notes (Signed)
Virtual Visit via Video Note  I connected with Cindy Pratt on 11/05/20 at  3:00 PM EST by a video enabled telemedicine application and verified that I am speaking with the correct person using two identifiers.  Participating Parties Patient Provider  Location: Patient: Home Provider: Home Office   I discussed the limitations of evaluation and management by telemedicine and the availability of in person appointments. The patient expressed understanding and agreed to proceed.  THERAPY PROGRESS NOTE  Session Time: 10 Minutes  Participation Level: Minimal  Behavioral Response: CasualAlertEuthymic  Type of Therapy: Individual Therapy  Treatment Goals addressed: Coping  Interventions: Supportive  Summary: Cindy Pratt is a 55 y.o. female who presents with anxiety and depression sxs. Pt reported feeling "okay" today and is currently watching her grandson. Pt reported she would have to end session early due to lack of privacy. Pt reported that even though she is sleeping fine at night she continues to have great difficulty getting up in the morning s/ "I won't wake up to the alarm clock" and relies on her daughter to wake her up. "It seems all I want to do is sleep". Pt reported she was able to start attending church again in the evenings. Pt reported that she is having difficulty setting boundaries with her ex-husband after being married for 22 years. Pt reported he will often text and call her on the phone and tell her he loves her. Pt reported she responds to all of his texts and is afraid of hurting his feelings, however, acknowledges she does not want to get back together. Pt denied any safety concerns. Pt reported no falls since last session and no other concerns at this time. Pt to follow up in 3 weeks.   Suicidal/Homicidal: No  Therapist Response: Therapist met with patient for follow up. Therapist and patient explored current stressors and attempts to cope. Therapist validated  patient feelings/concerns.  Plan: Return again in 3 weeks.  Diagnosis: Axis I: Bipolar, Depressed and Post Traumatic Stress Disorder    Axis II: N/A  Josephine Igo, LCSW, LCAS 11/05/2020

## 2020-11-10 ENCOUNTER — Other Ambulatory Visit: Payer: Self-pay | Admitting: Psychiatry

## 2020-11-10 ENCOUNTER — Encounter: Payer: Self-pay | Admitting: Registered Nurse

## 2020-11-10 ENCOUNTER — Other Ambulatory Visit: Payer: Self-pay

## 2020-11-10 ENCOUNTER — Encounter: Payer: Medicare Other | Attending: Registered Nurse | Admitting: Registered Nurse

## 2020-11-10 VITALS — BP 110/73 | HR 69 | Temp 98.8°F | Ht 64.0 in | Wt 150.2 lb

## 2020-11-10 DIAGNOSIS — M542 Cervicalgia: Secondary | ICD-10-CM | POA: Diagnosis present

## 2020-11-10 DIAGNOSIS — Z5181 Encounter for therapeutic drug level monitoring: Secondary | ICD-10-CM | POA: Diagnosis present

## 2020-11-10 DIAGNOSIS — M5412 Radiculopathy, cervical region: Secondary | ICD-10-CM

## 2020-11-10 DIAGNOSIS — M62838 Other muscle spasm: Secondary | ICD-10-CM

## 2020-11-10 DIAGNOSIS — M792 Neuralgia and neuritis, unspecified: Secondary | ICD-10-CM | POA: Diagnosis present

## 2020-11-10 DIAGNOSIS — M961 Postlaminectomy syndrome, not elsewhere classified: Secondary | ICD-10-CM

## 2020-11-10 DIAGNOSIS — M533 Sacrococcygeal disorders, not elsewhere classified: Secondary | ICD-10-CM | POA: Diagnosis present

## 2020-11-10 DIAGNOSIS — G43001 Migraine without aura, not intractable, with status migrainosus: Secondary | ICD-10-CM | POA: Diagnosis present

## 2020-11-10 DIAGNOSIS — M2550 Pain in unspecified joint: Secondary | ICD-10-CM

## 2020-11-10 DIAGNOSIS — M7061 Trochanteric bursitis, right hip: Secondary | ICD-10-CM

## 2020-11-10 DIAGNOSIS — F3162 Bipolar disorder, current episode mixed, moderate: Secondary | ICD-10-CM

## 2020-11-10 DIAGNOSIS — G894 Chronic pain syndrome: Secondary | ICD-10-CM

## 2020-11-10 DIAGNOSIS — M545 Low back pain, unspecified: Secondary | ICD-10-CM | POA: Diagnosis not present

## 2020-11-10 DIAGNOSIS — G8929 Other chronic pain: Secondary | ICD-10-CM

## 2020-11-10 DIAGNOSIS — M797 Fibromyalgia: Secondary | ICD-10-CM | POA: Diagnosis present

## 2020-11-10 MED ORDER — MORPHINE SULFATE ER 30 MG PO TBCR: 30.0000 mg | EXTENDED_RELEASE_TABLET | Freq: Three times a day (TID) | ORAL | 0 refills | Status: DC

## 2020-11-10 MED ORDER — TOPIRAMATE 50 MG PO TABS: 50.0000 mg | ORAL_TABLET | Freq: Two times a day (BID) | ORAL | 3 refills | Status: DC

## 2020-11-10 MED ORDER — TIZANIDINE HCL 4 MG PO TABS: 4.0000 mg | ORAL_TABLET | Freq: Two times a day (BID) | ORAL | 5 refills | Status: DC
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
## 2020-11-26 ENCOUNTER — Telehealth: Payer: Medicare Other | Admitting: Psychiatry

## 2020-11-26 ENCOUNTER — Ambulatory Visit: Payer: Medicare Other | Admitting: Licensed Clinical Social Worker

## 2020-12-03 ENCOUNTER — Encounter: Payer: Self-pay | Admitting: Registered Nurse

## 2020-12-03 ENCOUNTER — Other Ambulatory Visit: Payer: Self-pay

## 2020-12-03 ENCOUNTER — Encounter: Payer: Medicare Other | Attending: Registered Nurse | Admitting: Registered Nurse

## 2020-12-03 VITALS — BP 104/67 | HR 64 | Temp 98.6°F | Ht 64.0 in | Wt 149.4 lb

## 2020-12-03 DIAGNOSIS — M2550 Pain in unspecified joint: Secondary | ICD-10-CM

## 2020-12-03 DIAGNOSIS — M62838 Other muscle spasm: Secondary | ICD-10-CM | POA: Diagnosis present

## 2020-12-03 DIAGNOSIS — M545 Low back pain, unspecified: Secondary | ICD-10-CM | POA: Insufficient documentation

## 2020-12-03 DIAGNOSIS — M797 Fibromyalgia: Secondary | ICD-10-CM | POA: Diagnosis present

## 2020-12-03 DIAGNOSIS — G894 Chronic pain syndrome: Secondary | ICD-10-CM | POA: Diagnosis present

## 2020-12-03 DIAGNOSIS — M542 Cervicalgia: Secondary | ICD-10-CM

## 2020-12-03 DIAGNOSIS — Z5181 Encounter for therapeutic drug level monitoring: Secondary | ICD-10-CM

## 2020-12-03 DIAGNOSIS — G8929 Other chronic pain: Secondary | ICD-10-CM

## 2020-12-03 DIAGNOSIS — G43001 Migraine without aura, not intractable, with status migrainosus: Secondary | ICD-10-CM

## 2020-12-03 DIAGNOSIS — M5412 Radiculopathy, cervical region: Secondary | ICD-10-CM | POA: Diagnosis not present

## 2020-12-03 DIAGNOSIS — M961 Postlaminectomy syndrome, not elsewhere classified: Secondary | ICD-10-CM

## 2020-12-03 MED ORDER — MORPHINE SULFATE ER 30 MG PO TBCR: 30.0000 mg | EXTENDED_RELEASE_TABLET | Freq: Three times a day (TID) | ORAL | 0 refills | Status: DC

## 2020-12-03 MED ORDER — TRAMADOL HCL 50 MG PO TABS: 50.0000 mg | ORAL_TABLET | Freq: Three times a day (TID) | ORAL | 2 refills | Status: DC | PRN
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
## 2020-12-04 ENCOUNTER — Encounter: Payer: Self-pay | Admitting: Registered Nurse

## 2020-12-10 ENCOUNTER — Other Ambulatory Visit: Payer: Self-pay

## 2020-12-10 ENCOUNTER — Ambulatory Visit: Payer: Medicare Other | Admitting: Licensed Clinical Social Worker

## 2020-12-10 ENCOUNTER — Telehealth (INDEPENDENT_AMBULATORY_CARE_PROVIDER_SITE_OTHER): Payer: Medicare Other | Admitting: Psychiatry

## 2020-12-10 DIAGNOSIS — Z5329 Procedure and treatment not carried out because of patient's decision for other reasons: Secondary | ICD-10-CM

## 2020-12-10 DIAGNOSIS — Z91199 Patient's noncompliance with other medical treatment and regimen due to unspecified reason: Secondary | ICD-10-CM | POA: Insufficient documentation

## 2020-12-10 NOTE — Progress Notes (Signed)
No response to call or text or video invite  

## 2020-12-15 ENCOUNTER — Encounter: Payer: Self-pay | Admitting: *Deleted

## 2021-01-05 ENCOUNTER — Encounter: Payer: Self-pay | Admitting: Psychiatry

## 2021-01-05 ENCOUNTER — Other Ambulatory Visit: Payer: Self-pay

## 2021-01-05 ENCOUNTER — Telehealth (INDEPENDENT_AMBULATORY_CARE_PROVIDER_SITE_OTHER): Payer: Medicare Other | Admitting: Psychiatry

## 2021-01-05 DIAGNOSIS — F4310 Post-traumatic stress disorder, unspecified: Secondary | ICD-10-CM | POA: Diagnosis not present

## 2021-01-05 DIAGNOSIS — F3178 Bipolar disorder, in full remission, most recent episode mixed: Secondary | ICD-10-CM | POA: Diagnosis not present

## 2021-01-05 DIAGNOSIS — F4323 Adjustment disorder with mixed anxiety and depressed mood: Secondary | ICD-10-CM | POA: Insufficient documentation

## 2021-01-05 MED ORDER — RISPERIDONE 2 MG PO TABS: 2.0000 mg | ORAL_TABLET | Freq: Every day | ORAL | 0 refills | Status: DC

## 2021-01-05 MED ORDER — LAMOTRIGINE 100 MG PO TABS: 50.0000 mg | ORAL_TABLET | Freq: Two times a day (BID) | ORAL | 0 refills | Status: DC
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
## 2021-01-06 ENCOUNTER — Ambulatory Visit
Admission: RE | Admit: 2021-01-06 | Discharge: 2021-01-06 | Disposition: A | Discharge status: Home / Self Care | Payer: Medicare Other | Source: Ambulatory Visit | Attending: Registered Nurse | Admitting: Registered Nurse

## 2021-01-06 ENCOUNTER — Other Ambulatory Visit: Payer: Self-pay

## 2021-01-06 ENCOUNTER — Encounter: Payer: Self-pay | Admitting: Registered Nurse

## 2021-01-06 ENCOUNTER — Encounter: Payer: Medicare Other | Attending: Registered Nurse | Admitting: Registered Nurse

## 2021-01-06 VITALS — BP 114/72 | HR 59 | Temp 98.7°F | Ht 64.0 in | Wt 151.8 lb

## 2021-01-06 DIAGNOSIS — M797 Fibromyalgia: Secondary | ICD-10-CM | POA: Insufficient documentation

## 2021-01-06 DIAGNOSIS — G8929 Other chronic pain: Secondary | ICD-10-CM | POA: Diagnosis present

## 2021-01-06 DIAGNOSIS — Z79891 Long term (current) use of opiate analgesic: Secondary | ICD-10-CM | POA: Diagnosis not present

## 2021-01-06 DIAGNOSIS — M2550 Pain in unspecified joint: Secondary | ICD-10-CM | POA: Insufficient documentation

## 2021-01-06 DIAGNOSIS — M5412 Radiculopathy, cervical region: Secondary | ICD-10-CM | POA: Insufficient documentation

## 2021-01-06 DIAGNOSIS — G894 Chronic pain syndrome: Secondary | ICD-10-CM | POA: Diagnosis present

## 2021-01-06 DIAGNOSIS — M545 Low back pain, unspecified: Secondary | ICD-10-CM | POA: Diagnosis present

## 2021-01-06 DIAGNOSIS — M542 Cervicalgia: Secondary | ICD-10-CM | POA: Insufficient documentation

## 2021-01-06 DIAGNOSIS — M62838 Other muscle spasm: Secondary | ICD-10-CM | POA: Diagnosis present

## 2021-01-06 DIAGNOSIS — Z5181 Encounter for therapeutic drug level monitoring: Secondary | ICD-10-CM | POA: Diagnosis not present

## 2021-01-06 DIAGNOSIS — M961 Postlaminectomy syndrome, not elsewhere classified: Secondary | ICD-10-CM | POA: Diagnosis present

## 2021-01-06 DIAGNOSIS — M25562 Pain in left knee: Secondary | ICD-10-CM | POA: Insufficient documentation

## 2021-01-06 MED ORDER — MORPHINE SULFATE ER 30 MG PO TBCR: 30.0000 mg | EXTENDED_RELEASE_TABLET | Freq: Three times a day (TID) | ORAL | 0 refills | Status: DC
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
## 2021-01-11 LAB — DRUG TOX MONITOR 1 W/CONF, ORAL FLD
Amphetamines: NEGATIVE ng/mL (ref <10)
Barbiturates: NEGATIVE ng/mL (ref <10)
Benzodiazepines: NEGATIVE ng/mL (ref <0.50)
Buprenorphine: NEGATIVE ng/mL (ref <0.10)
Cocaine: NEGATIVE ng/mL (ref <5.0)
Codeine: NEGATIVE ng/mL (ref <2.5)
Dihydrocodeine: NEGATIVE ng/mL (ref <2.5)
Fentanyl: NEGATIVE ng/mL (ref <0.10)
Heroin Metabolite: NEGATIVE ng/mL (ref <1.0)
Hydrocodone: NEGATIVE ng/mL (ref <2.5)
Hydromorphone: NEGATIVE ng/mL (ref <2.5)
MARIJUANA: NEGATIVE ng/mL (ref <2.5)
MDMA: NEGATIVE ng/mL (ref <10)
Meprobamate: NEGATIVE ng/mL (ref <2.5)
Methadone: NEGATIVE ng/mL (ref <5.0)
Morphine: 32.3 ng/mL — ABNORMAL HIGH (ref <2.5)
Nicotine Metabolite: NEGATIVE ng/mL (ref <5.0)
Norhydrocodone: NEGATIVE ng/mL (ref <2.5)
Noroxycodone: NEGATIVE ng/mL (ref <2.5)
Opiates: POSITIVE ng/mL — AB (ref <2.5)
Oxycodone: NEGATIVE ng/mL (ref <2.5)
Oxymorphone: NEGATIVE ng/mL (ref <2.5)
Phencyclidine: NEGATIVE ng/mL (ref <10)
Tapentadol: NEGATIVE ng/mL (ref <5.0)
Tramadol: >500 ng/mL — ABNORMAL HIGH (ref <5.0)
Tramadol: POSITIVE ng/mL — AB (ref <5.0)
Zolpidem: NEGATIVE ng/mL (ref <5.0)

## 2021-01-11 LAB — DRUG TOX ALC METAB W/CON, ORAL FLD: Alcohol Metabolite: NEGATIVE ng/mL (ref <25)

## 2021-01-13 ENCOUNTER — Other Ambulatory Visit: Payer: Self-pay

## 2021-01-13 ENCOUNTER — Ambulatory Visit (INDEPENDENT_AMBULATORY_CARE_PROVIDER_SITE_OTHER): Payer: Medicare Other | Admitting: Licensed Clinical Social Worker

## 2021-01-13 DIAGNOSIS — F3178 Bipolar disorder, in full remission, most recent episode mixed: Secondary | ICD-10-CM | POA: Diagnosis not present

## 2021-01-13 DIAGNOSIS — F4310 Post-traumatic stress disorder, unspecified: Secondary | ICD-10-CM

## 2021-01-13 NOTE — Progress Notes (Signed)
Virtual Visit via Video Note  I connected with Cindy Pratt on 01/13/21 at  3:00 PM EDT by a video enabled telemedicine application and verified that I am speaking with the correct person using two identifiers.  Location: Patient: home Provider: remote office [place], [place])   I discussed the limitations of evaluation and management by telemedicine and the availability of in person appointments. The patient expressed understanding and agreed to proceed.  I discussed the assessment and treatment plan with the patient. The patient was provided an opportunity to ask questions and all were answered. The patient agreed with the plan and demonstrated an understanding of the instructions.   The patient was advised to call back or seek an in-person evaluation if the symptoms worsen or if the condition fails to improve as anticipated.  I provided 45 minutes of non-face-to-face time during this encounter.   Merick Kelleher R Isaish Alemu, LCSW    THERAPIST PROGRESS NOTE  Session Time: 3-3:45p  Participation Level: Active  Behavioral Response: NeatAlertDepressed  Type of Therapy: Individual Therapy  Treatment Goals addressed: Coping  Interventions: CBT and DBT  Summary: Cindy Pratt is a 55 y.o. female who presents with symptoms associated with PTSD and bipolar disorder. Pt reporting that currently mood is stable. Pt feels she has good coping skills to manage stress/anxiety routinely.    Allowed pt safe space to explore and express thoughts and feelings about recent external stressors and lifelong trauma/timeline.  Pt discussed relationships with older children, younger children, and grandchildren.  Discussed events that triggered conflict within family members.   Discussed pts diagnosis of bipolar disorder in the past and overall psychological impact. Discussed psychiatric treatment in the past and compared/contrasted what was helpful and what was not helpful.   Pt enjoys  journaling--encouraged pt to continue.  Continued recommendations are as follows: self care behaviors, positive social engagements, focusing on overall work/home/life balance, and focusing on positive physical and emotional wellness.   Suicidal/Homicidal: No  Therapist Response: initial assessment (1st session w/ [name])  and revision of treatment plan  Plan: Return again in 4 weeks.  Diagnosis: Axis I: Bipolar, Depressed and Post Traumatic Stress Disorder    Axis II: No diagnosis    Ernest Haber Osa Fogarty, LCSW 01/13/2021

## 2021-01-20 ENCOUNTER — Telehealth: Payer: Self-pay | Admitting: *Deleted

## 2021-01-20 NOTE — Telephone Encounter (Signed)
Oral swab drug screen was consistent for prescribed medications.  ?

## 2021-02-02 ENCOUNTER — Other Ambulatory Visit: Payer: Self-pay | Admitting: Registered Nurse

## 2021-02-02 ENCOUNTER — Other Ambulatory Visit: Payer: Self-pay | Admitting: Psychiatry

## 2021-02-02 DIAGNOSIS — F4323 Adjustment disorder with mixed anxiety and depressed mood: Secondary | ICD-10-CM

## 2021-02-03 ENCOUNTER — Encounter: Payer: Medicare Other | Admitting: Registered Nurse

## 2021-02-03 ENCOUNTER — Telehealth: Payer: Self-pay | Admitting: Registered Nurse

## 2021-02-03 MED ORDER — MORPHINE SULFATE ER 30 MG PO TBCR: 30.0000 mg | EXTENDED_RELEASE_TABLET | Freq: Three times a day (TID) | ORAL | 0 refills | Status: DC

## 2021-02-03 NOTE — Telephone Encounter (Signed)
PMP was reviewed. MS Contin e-scribed. Ms.Litke aware

## 2021-02-03 NOTE — Telephone Encounter (Signed)
Ms. Whitehair called office reporting she has diarrhea. Placed a call to Ms. Bednarczyk, she reports the above, her appointment was changed.  PMP was reviewed and MS Contin e-scribed. Ms. Abbett will F/U with her PCP regarding diarrhea.

## 2021-02-12 ENCOUNTER — Other Ambulatory Visit: Payer: Self-pay

## 2021-02-12 ENCOUNTER — Ambulatory Visit (INDEPENDENT_AMBULATORY_CARE_PROVIDER_SITE_OTHER): Payer: Medicare Other | Admitting: Licensed Clinical Social Worker

## 2021-02-12 DIAGNOSIS — F4310 Post-traumatic stress disorder, unspecified: Secondary | ICD-10-CM

## 2021-02-12 DIAGNOSIS — F3178 Bipolar disorder, in full remission, most recent episode mixed: Secondary | ICD-10-CM
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
## 2021-02-18 ENCOUNTER — Encounter: Payer: Medicare Other | Attending: Registered Nurse | Admitting: Registered Nurse

## 2021-02-18 ENCOUNTER — Other Ambulatory Visit: Payer: Self-pay

## 2021-02-18 VITALS — BP 115/77 | HR 61 | Temp 98.6°F | Ht 64.0 in | Wt 156.8 lb

## 2021-02-18 DIAGNOSIS — M7061 Trochanteric bursitis, right hip: Secondary | ICD-10-CM | POA: Diagnosis present

## 2021-02-18 DIAGNOSIS — M5412 Radiculopathy, cervical region: Secondary | ICD-10-CM | POA: Insufficient documentation

## 2021-02-18 DIAGNOSIS — G8929 Other chronic pain: Secondary | ICD-10-CM | POA: Diagnosis present

## 2021-02-18 DIAGNOSIS — M961 Postlaminectomy syndrome, not elsewhere classified: Secondary | ICD-10-CM | POA: Insufficient documentation

## 2021-02-18 DIAGNOSIS — M545 Low back pain, unspecified: Secondary | ICD-10-CM | POA: Insufficient documentation

## 2021-02-18 DIAGNOSIS — M797 Fibromyalgia: Secondary | ICD-10-CM | POA: Diagnosis present

## 2021-02-18 DIAGNOSIS — M2550 Pain in unspecified joint: Secondary | ICD-10-CM | POA: Diagnosis present

## 2021-02-18 DIAGNOSIS — G894 Chronic pain syndrome: Secondary | ICD-10-CM

## 2021-02-18 DIAGNOSIS — M542 Cervicalgia: Secondary | ICD-10-CM | POA: Diagnosis not present

## 2021-02-18 DIAGNOSIS — M25511 Pain in right shoulder: Secondary | ICD-10-CM

## 2021-02-18 DIAGNOSIS — M7062 Trochanteric bursitis, left hip: Secondary | ICD-10-CM

## 2021-02-18 DIAGNOSIS — Z79891 Long term (current) use of opiate analgesic: Secondary | ICD-10-CM | POA: Insufficient documentation

## 2021-02-18 DIAGNOSIS — M25512 Pain in left shoulder: Secondary | ICD-10-CM | POA: Diagnosis present

## 2021-02-18 MED ORDER — TOPIRAMATE 50 MG PO TABS: 50.0000 mg | ORAL_TABLET | Freq: Two times a day (BID) | ORAL | 3 refills | Status: DC

## 2021-02-18 MED ORDER — MORPHINE SULFATE ER 30 MG PO TBCR: 30.0000 mg | EXTENDED_RELEASE_TABLET | Freq: Three times a day (TID) | ORAL | 0 refills | Status: DC

## 2021-02-18 MED ORDER — TRAMADOL HCL 50 MG PO TABS: 50.0000 mg | ORAL_TABLET | Freq: Three times a day (TID) | ORAL | 2 refills | Status: DC | PRN
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
## 2021-02-22 ENCOUNTER — Encounter: Payer: Self-pay | Admitting: Registered Nurse

## 2021-03-03 ENCOUNTER — Other Ambulatory Visit: Payer: Self-pay | Admitting: Infectious Diseases

## 2021-03-03 DIAGNOSIS — Z1231 Encounter for screening mammogram for malignant neoplasm of breast: Secondary | ICD-10-CM

## 2021-03-16 ENCOUNTER — Other Ambulatory Visit: Payer: Self-pay

## 2021-03-16 ENCOUNTER — Telehealth (INDEPENDENT_AMBULATORY_CARE_PROVIDER_SITE_OTHER): Payer: Medicare Other | Admitting: Psychiatry

## 2021-03-16 ENCOUNTER — Encounter: Payer: Self-pay | Admitting: Psychiatry

## 2021-03-16 DIAGNOSIS — F4323 Adjustment disorder with mixed anxiety and depressed mood: Secondary | ICD-10-CM

## 2021-03-16 DIAGNOSIS — F4310 Post-traumatic stress disorder, unspecified: Secondary | ICD-10-CM

## 2021-03-16 DIAGNOSIS — F3178 Bipolar disorder, in full remission, most recent episode mixed: Secondary | ICD-10-CM

## 2021-03-16 MED ORDER — BUPROPION HCL ER (SR) 150 MG PO TB12: 150.0000 mg | ORAL_TABLET | Freq: Two times a day (BID) | ORAL | 1 refills | Status: DC

## 2021-03-16 MED ORDER — LAMOTRIGINE 100 MG PO TABS: 50.0000 mg | ORAL_TABLET | Freq: Two times a day (BID) | ORAL | 0 refills | Status: DC

## 2021-03-16 MED ORDER — RISPERIDONE 2 MG PO TABS: 2.0000 mg | ORAL_TABLET | Freq: Every day | ORAL | 0 refills | Status: DC
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
## 2021-03-25 ENCOUNTER — Other Ambulatory Visit: Payer: Self-pay

## 2021-03-25 ENCOUNTER — Encounter: Payer: Medicare Other | Attending: Registered Nurse | Admitting: Registered Nurse

## 2021-03-25 ENCOUNTER — Encounter: Payer: Self-pay | Admitting: Registered Nurse

## 2021-03-25 VITALS — BP 120/76 | HR 67 | Temp 98.5°F | Ht 64.0 in | Wt 157.8 lb

## 2021-03-25 DIAGNOSIS — M797 Fibromyalgia: Secondary | ICD-10-CM | POA: Diagnosis present

## 2021-03-25 DIAGNOSIS — G8929 Other chronic pain: Secondary | ICD-10-CM | POA: Diagnosis present

## 2021-03-25 DIAGNOSIS — G894 Chronic pain syndrome: Secondary | ICD-10-CM | POA: Insufficient documentation

## 2021-03-25 DIAGNOSIS — M2550 Pain in unspecified joint: Secondary | ICD-10-CM | POA: Insufficient documentation

## 2021-03-25 DIAGNOSIS — M961 Postlaminectomy syndrome, not elsewhere classified: Secondary | ICD-10-CM | POA: Diagnosis not present

## 2021-03-25 DIAGNOSIS — M545 Low back pain, unspecified: Secondary | ICD-10-CM | POA: Insufficient documentation

## 2021-03-25 DIAGNOSIS — M5412 Radiculopathy, cervical region: Secondary | ICD-10-CM | POA: Diagnosis not present

## 2021-03-25 DIAGNOSIS — M7061 Trochanteric bursitis, right hip: Secondary | ICD-10-CM | POA: Diagnosis present

## 2021-03-25 DIAGNOSIS — M542 Cervicalgia: Secondary | ICD-10-CM | POA: Insufficient documentation

## 2021-03-25 DIAGNOSIS — Z79891 Long term (current) use of opiate analgesic: Secondary | ICD-10-CM | POA: Diagnosis present

## 2021-03-25 DIAGNOSIS — Z5181 Encounter for therapeutic drug level monitoring: Secondary | ICD-10-CM | POA: Insufficient documentation

## 2021-03-25 MED ORDER — TRAMADOL HCL 50 MG PO TABS: 50.0000 mg | ORAL_TABLET | Freq: Three times a day (TID) | ORAL | 2 refills | Status: DC | PRN

## 2021-03-25 MED ORDER — MORPHINE SULFATE ER 30 MG PO TBCR: 30.0000 mg | EXTENDED_RELEASE_TABLET | Freq: Three times a day (TID) | ORAL | 0 refills | Status: DC
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
## 2021-03-30 ENCOUNTER — Other Ambulatory Visit: Payer: Self-pay

## 2021-03-30 ENCOUNTER — Ambulatory Visit (INDEPENDENT_AMBULATORY_CARE_PROVIDER_SITE_OTHER): Payer: Self-pay | Admitting: Licensed Clinical Social Worker

## 2021-03-30 DIAGNOSIS — Z5329 Procedure and treatment not carried out because of patient's decision for other reasons: Secondary | ICD-10-CM

## 2021-03-30 NOTE — Progress Notes (Signed)
LCSW counselor tried to connect with patient for scheduled appointment via MyChart video text request x 2 and email request; also tried to connect via phone without success. LCSW counselor left message for patient to call office number to reschedule OPT appointment.  

## 2021-04-23 ENCOUNTER — Ambulatory Visit: Payer: Medicare Other

## 2021-04-28 ENCOUNTER — Encounter: Payer: Medicare Other | Attending: Registered Nurse | Admitting: Registered Nurse

## 2021-04-28 ENCOUNTER — Other Ambulatory Visit: Payer: Self-pay

## 2021-04-28 ENCOUNTER — Encounter: Payer: Self-pay | Admitting: Registered Nurse

## 2021-04-28 VITALS — BP 106/71 | HR 69 | Temp 98.7°F | Ht 64.0 in | Wt 158.6 lb

## 2021-04-28 DIAGNOSIS — M542 Cervicalgia: Secondary | ICD-10-CM

## 2021-04-28 DIAGNOSIS — M2550 Pain in unspecified joint: Secondary | ICD-10-CM | POA: Diagnosis present

## 2021-04-28 DIAGNOSIS — Z5181 Encounter for therapeutic drug level monitoring: Secondary | ICD-10-CM | POA: Diagnosis present

## 2021-04-28 DIAGNOSIS — M7061 Trochanteric bursitis, right hip: Secondary | ICD-10-CM | POA: Diagnosis present

## 2021-04-28 DIAGNOSIS — M5412 Radiculopathy, cervical region: Secondary | ICD-10-CM | POA: Diagnosis not present

## 2021-04-28 DIAGNOSIS — G894 Chronic pain syndrome: Secondary | ICD-10-CM

## 2021-04-28 DIAGNOSIS — M545 Low back pain, unspecified: Secondary | ICD-10-CM

## 2021-04-28 DIAGNOSIS — M961 Postlaminectomy syndrome, not elsewhere classified: Secondary | ICD-10-CM | POA: Diagnosis not present

## 2021-04-28 DIAGNOSIS — M797 Fibromyalgia: Secondary | ICD-10-CM

## 2021-04-28 DIAGNOSIS — Z79891 Long term (current) use of opiate analgesic: Secondary | ICD-10-CM

## 2021-04-28 DIAGNOSIS — G8929 Other chronic pain: Secondary | ICD-10-CM | POA: Diagnosis present

## 2021-04-28 MED ORDER — MORPHINE SULFATE ER 30 MG PO TBCR: 30.0000 mg | EXTENDED_RELEASE_TABLET | Freq: Three times a day (TID) | ORAL | 0 refills | Status: DC
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
## 2021-05-06 ENCOUNTER — Encounter: Payer: Self-pay | Admitting: Registered Nurse

## 2021-05-06 ENCOUNTER — Ambulatory Visit: Payer: Medicare Other | Admitting: Internal Medicine

## 2021-05-27 ENCOUNTER — Ambulatory Visit (INDEPENDENT_AMBULATORY_CARE_PROVIDER_SITE_OTHER): Payer: Medicare Other | Admitting: Licensed Clinical Social Worker

## 2021-05-27 ENCOUNTER — Other Ambulatory Visit: Payer: Self-pay

## 2021-05-27 ENCOUNTER — Encounter: Payer: Medicare Other | Attending: Registered Nurse | Admitting: Registered Nurse

## 2021-05-27 ENCOUNTER — Encounter: Payer: Self-pay | Admitting: Registered Nurse

## 2021-05-27 VITALS — BP 119/78 | HR 75 | Temp 98.6°F | Ht 64.0 in | Wt 161.0 lb

## 2021-05-27 DIAGNOSIS — M542 Cervicalgia: Secondary | ICD-10-CM | POA: Diagnosis not present

## 2021-05-27 DIAGNOSIS — Z5181 Encounter for therapeutic drug level monitoring: Secondary | ICD-10-CM | POA: Insufficient documentation

## 2021-05-27 DIAGNOSIS — M7061 Trochanteric bursitis, right hip: Secondary | ICD-10-CM | POA: Diagnosis present

## 2021-05-27 DIAGNOSIS — Z79891 Long term (current) use of opiate analgesic: Secondary | ICD-10-CM | POA: Insufficient documentation

## 2021-05-27 DIAGNOSIS — M7062 Trochanteric bursitis, left hip: Secondary | ICD-10-CM | POA: Insufficient documentation

## 2021-05-27 DIAGNOSIS — M5412 Radiculopathy, cervical region: Secondary | ICD-10-CM | POA: Diagnosis present

## 2021-05-27 DIAGNOSIS — M797 Fibromyalgia: Secondary | ICD-10-CM | POA: Insufficient documentation

## 2021-05-27 DIAGNOSIS — M961 Postlaminectomy syndrome, not elsewhere classified: Secondary | ICD-10-CM | POA: Insufficient documentation

## 2021-05-27 DIAGNOSIS — G8929 Other chronic pain: Secondary | ICD-10-CM | POA: Insufficient documentation

## 2021-05-27 DIAGNOSIS — G894 Chronic pain syndrome: Secondary | ICD-10-CM | POA: Diagnosis not present

## 2021-05-27 DIAGNOSIS — M2550 Pain in unspecified joint: Secondary | ICD-10-CM | POA: Insufficient documentation

## 2021-05-27 DIAGNOSIS — F3131 Bipolar disorder, current episode depressed, mild: Secondary | ICD-10-CM

## 2021-05-27 DIAGNOSIS — M545 Low back pain, unspecified: Secondary | ICD-10-CM | POA: Diagnosis present

## 2021-05-27 DIAGNOSIS — F4310 Post-traumatic stress disorder, unspecified: Secondary | ICD-10-CM

## 2021-05-27 MED ORDER — MORPHINE SULFATE ER 30 MG PO TBCR: 30.0000 mg | EXTENDED_RELEASE_TABLET | Freq: Three times a day (TID) | ORAL | 0 refills | Status: DC

## 2021-05-27 MED ORDER — TIZANIDINE HCL 4 MG PO TABS: 4.0000 mg | ORAL_TABLET | Freq: Two times a day (BID) | ORAL | 5 refills | Status: DC

## 2021-05-27 MED ORDER — TOPIRAMATE 50 MG PO TABS: 50.0000 mg | ORAL_TABLET | Freq: Two times a day (BID) | ORAL | 3 refills | Status: DC

## 2021-05-27 MED ORDER — TRAMADOL HCL 50 MG PO TABS: 50.0000 mg | ORAL_TABLET | Freq: Three times a day (TID) | ORAL | 2 refills | Status: DC | PRN
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
## 2021-06-03 LAB — DRUG TOX MONITOR 1 W/CONF, ORAL FLD
Amphetamines: NEGATIVE ng/mL (ref <10)
Barbiturates: NEGATIVE ng/mL (ref <10)
Benzodiazepines: NEGATIVE ng/mL (ref <0.50)
Buprenorphine: NEGATIVE ng/mL (ref <0.10)
Cocaine: NEGATIVE ng/mL (ref <5.0)
Codeine: NEGATIVE ng/mL (ref <2.5)
Dihydrocodeine: NEGATIVE ng/mL (ref <2.5)
Fentanyl: NEGATIVE ng/mL (ref <0.10)
Heroin Metabolite: NEGATIVE ng/mL (ref <1.0)
Hydrocodone: NEGATIVE ng/mL (ref <2.5)
Hydromorphone: NEGATIVE ng/mL (ref <2.5)
MARIJUANA: NEGATIVE ng/mL (ref <2.5)
MDMA: NEGATIVE ng/mL (ref <10)
Meprobamate: NEGATIVE ng/mL (ref <2.5)
Methadone: NEGATIVE ng/mL (ref <5.0)
Morphine: 75.7 ng/mL — ABNORMAL HIGH (ref <2.5)
Nicotine Metabolite: NEGATIVE ng/mL (ref <5.0)
Norhydrocodone: NEGATIVE ng/mL (ref <2.5)
Noroxycodone: NEGATIVE ng/mL (ref <2.5)
Opiates: POSITIVE ng/mL — AB (ref <2.5)
Oxycodone: NEGATIVE ng/mL (ref <2.5)
Oxymorphone: NEGATIVE ng/mL (ref <2.5)
Phencyclidine: NEGATIVE ng/mL (ref <10)
Tapentadol: NEGATIVE ng/mL (ref <5.0)
Tramadol: >500 ng/mL — ABNORMAL HIGH (ref <5.0)
Tramadol: POSITIVE ng/mL — AB (ref <5.0)
Zolpidem: NEGATIVE ng/mL (ref <5.0)

## 2021-06-03 LAB — DRUG TOX ALC METAB W/CON, ORAL FLD: Alcohol Metabolite: NEGATIVE ng/mL (ref <25)

## 2021-06-08 ENCOUNTER — Telehealth: Payer: Self-pay | Admitting: *Deleted

## 2021-06-08 ENCOUNTER — Encounter: Payer: Self-pay | Admitting: Psychiatry

## 2021-06-08 ENCOUNTER — Telehealth (INDEPENDENT_AMBULATORY_CARE_PROVIDER_SITE_OTHER): Payer: Medicare Other | Admitting: Psychiatry

## 2021-06-08 ENCOUNTER — Other Ambulatory Visit: Payer: Self-pay

## 2021-06-08 DIAGNOSIS — F3132 Bipolar disorder, current episode depressed, moderate: Secondary | ICD-10-CM | POA: Diagnosis not present

## 2021-06-08 DIAGNOSIS — Z79899 Other long term (current) drug therapy: Secondary | ICD-10-CM | POA: Diagnosis not present

## 2021-06-08 DIAGNOSIS — F4323 Adjustment disorder with mixed anxiety and depressed mood: Secondary | ICD-10-CM | POA: Diagnosis not present

## 2021-06-08 DIAGNOSIS — F4310 Post-traumatic stress disorder, unspecified: Secondary | ICD-10-CM | POA: Diagnosis not present

## 2021-06-08 MED ORDER — LAMOTRIGINE 100 MG PO TABS: 50.0000 mg | ORAL_TABLET | Freq: Two times a day (BID) | ORAL | 0 refills | Status: DC

## 2021-06-08 MED ORDER — RISPERIDONE 2 MG PO TABS: 2.0000 mg | ORAL_TABLET | Freq: Every day | ORAL | 0 refills | Status: DC
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
## 2021-06-08 NOTE — Telephone Encounter (Signed)
Oral swab drug screen was consistent for prescribed medications.  ?

## 2021-06-17 ENCOUNTER — Ambulatory Visit (INDEPENDENT_AMBULATORY_CARE_PROVIDER_SITE_OTHER): Payer: Medicare Other | Admitting: Internal Medicine

## 2021-06-17 ENCOUNTER — Ambulatory Visit: Payer: Medicare Other | Admitting: Internal Medicine

## 2021-06-17 ENCOUNTER — Other Ambulatory Visit: Payer: Self-pay

## 2021-06-17 ENCOUNTER — Encounter: Payer: Self-pay | Admitting: Internal Medicine

## 2021-06-17 VITALS — BP 110/70 | HR 71 | Ht 64.0 in | Wt 163.0 lb

## 2021-06-17 DIAGNOSIS — E785 Hyperlipidemia, unspecified: Secondary | ICD-10-CM | POA: Diagnosis not present

## 2021-06-17 DIAGNOSIS — R0609 Other forms of dyspnea: Secondary | ICD-10-CM

## 2021-06-17 DIAGNOSIS — R0602 Shortness of breath: Secondary | ICD-10-CM

## 2021-06-17 DIAGNOSIS — R072 Precordial pain: Secondary | ICD-10-CM

## 2021-06-17 DIAGNOSIS — R42 Dizziness and giddiness: Secondary | ICD-10-CM | POA: Diagnosis not present

## 2021-06-17 MED ORDER — METOPROLOL TARTRATE 100 MG PO TABS: 100.0000 mg | ORAL_TABLET | Freq: Once | ORAL | 0 refills | Status: DC

## 2021-06-17 NOTE — Progress Notes (Signed)
New Outpatient Visit Date: 06/17/2021  Referring Provider: Leonel Ramsay, MD [place],  [place] [id number]  Chief Complaint: Chest pain  HPI:  Cindy Pratt is a 55 y.o. female who is being seen today for the evaluation of chest pain at the request of Dr. Ola Spurr. She has a history of hyperlipidemia, mild COPD, fibromyalgia, chronic back pain, anxiety, and bipolar disorder.  She reports intermittent chest pain for the last several months.  It is sharp and seems to come and go over the course of several minutes.  It happens every few days, often when she is sitting and reading.  She notes exertional dyspnea with mild activity.  She notes that her heart seems to speed up when she walks but otherwise does not recall palpitations.  She reports occasional brief lightheadedness.  She sometimes feels like she might pass out, which usually coincides with rises in her BP to 518-841 mmHg systolic.  This feeling usually lasts a couple of minutes before resolving on its own.  She has never passed out.  She was evaluated by Dr. Marlou Porch in 2016 for chest pain.  Cindy Pratt does not recall specifics about her symptoms at that time.  Stress test was normal and no further workup was pursued.  --------------------------------------------------------------------------------------------------  Cardiovascular History & Procedures: Cardiovascular Problems: Chest pain Lightheadedness Dyspnea on exertion  Risk Factors: Hyperlipidemia and prior tobacco use  Cath/PCI: None  CV Surgery: None  EP Procedures and Devices: None  Non-Invasive Evaluation(s): Pharmacologic MPI (05/23/2015): Normal study without ischemia or scar.  Recent CV Pertinent Labs: Lab Results  Component Value Date   CHOL 200 (H) 09/25/2018   HDL 56 09/25/2018   LDLCALC 116 (H) 09/25/2018   LDLDIRECT 128.7 01/29/2009   TRIG 139 09/25/2018   CHOLHDL 3 01/29/2009   INR 1.1 06/09/2020   K 3.2 (L) 06/09/2020    BUN 16 06/09/2020   CREATININE 1.08 (H) 06/09/2020    --------------------------------------------------------------------------------------------------  Past Medical History:  Diagnosis Date   Anxiety    Asthma    Belchings    Bipolar disorder ([facility])    Chronic pain syndrome    in pain clinic   Degenerative joint disease    Depression    Disturbance of skin sensation    Fibromyalgia    GERD (gastroesophageal reflux disease)    History of kidney stones    Hyperlipidemia    Lumbar post-laminectomy syndrome    Lumbosacral neuritis    Osteoarthritis    Other chronic postoperative pain    Sciatica     Past Surgical History:  Procedure Laterality Date   ABDOMINAL HYSTERECTOMY     BACK SURGERY  1997, 2003   CYSTECTOMY     DIAGNOSTIC LAPAROSCOPY     EXTRACORPOREAL SHOCK WAVE LITHOTRIPSY Right 05/04/2018   Procedure: RIGHT EXTRACORPOREAL SHOCK WAVE LITHOTRIPSY (ESWL) WITH MAC;  Surgeon: Kathie Rhodes, MD;  Location: [facility];  Service: Urology;  Laterality: Right;   IR ANGIO INTRA EXTRACRAN SEL COM CAROTID INNOMINATE BILAT MOD SED  06/09/2020   IR ANGIO VERTEBRAL SEL VERTEBRAL BILAT MOD SED  06/09/2020   IR RADIOLOGIST EVAL & MGMT  08/10/2020   IR US GUIDE VASC ACCESS RIGHT  06/09/2020   WRIST ARTHROPLASTY Left    WRIST ARTHROSCOPY WITH DEBRIDEMENT Right 11/13/2015   Procedure: RIGHT WRIST ARTHROSCOPY WITH DEBRIDEMENT;  Surgeon: Leanora Cover, MD;  Location: [facility];  Service: Orthopedics;  Laterality: Right;    Current Meds  Medication Sig  albuterol (VENTOLIN HFA) 108 (90 Base) MCG/ACT inhaler Inhale 2 puffs into the lungs every 6 (six) hours as needed for wheezing or shortness of breath.    buPROPion (WELLBUTRIN SR) 150 MG 12 hr tablet Take 1 tablet (150 mg total) by mouth 2 (two) times daily.   lamoTRIgine (LAMICTAL) 100 MG tablet Take 0.5 tablets (50 mg total) by mouth 2 (two) times daily.   morphine (MS CONTIN) 30 MG 12 hr tablet Take 1 tablet (30 mg total)  by mouth every 8 (eight) hours.   ondansetron (ZOFRAN) 4 MG tablet Take 4 mg by mouth every 8 (eight) hours as needed for nausea or vomiting.   risperiDONE (RISPERDAL) 2 MG tablet Take 1 tablet (2 mg total) by mouth at bedtime.   tiZANidine (ZANAFLEX) 4 MG tablet Take 1 tablet (4 mg total) by mouth 2 (two) times daily.   topiramate (TOPAMAX) 50 MG tablet Take 1 tablet (50 mg total) by mouth 2 (two) times daily.   traMADol (ULTRAM) 50 MG tablet Take 1 tablet (50 mg total) by mouth 3 (three) times daily as needed.    Allergies: Aspirin, Nsaids, Sulfa antibiotics, Sulfonamide derivatives, Tolmetin, Cymbalta [duloxetine hcl], Duloxetine, Gabapentin, Other, and Pregabalin  Social History   Tobacco Use   Smoking status: Former    Packs/day: 1.00    Years: 20.00    Pack years: 20.00    Types: Cigarettes    Quit date: 2006    Years since quitting: 16.8   Smokeless tobacco: Never  Vaping Use   Vaping Use: Never used  Substance Use Topics   Alcohol use: No   Drug use: No    Family History  Problem Relation Age of Onset   Hypertension Mother    Lung disease Mother    Hypertension Father    Heart attack Father 61   Drug abuse Father    Alcohol abuse Father    Breast cancer Maternal Grandmother    Liver cancer Maternal Grandmother    Cancer Maternal Grandmother        breast cancer   Diabetes Maternal Grandfather        both sides   Alcohol abuse Paternal Grandfather    Drug abuse Paternal Grandfather    Ovarian cancer Cousin    Cancer Cousin        1st c. maternal side/ cervical cancer   Cancer Cousin        1st c maternal/ colon cancer    Review of Systems: A 12-system review of systems was performed and was negative except as noted in the HPI.  --------------------------------------------------------------------------------------------------  Physical Exam: BP 110/70 (BP Location: Right Arm, Patient Position: Sitting, Cuff Size: Large)   Pulse 71   Ht _0  (1.626 m)    Wt 163 lb (73.9 kg)   SpO2 98%   BMI 27.98 kg/m   General:  NAD HEENT: No conjunctival pallor or scleral icterus. Facemask in place. Neck: Supple without lymphadenopathy, thyromegaly, JVD, or HJR. No carotid bruit. Lungs: Normal work of breathing. Clear to auscultation bilaterally without wheezes or crackles. Heart: Regular rate and rhythm without murmurs, rubs, or gallops. Non-displaced PMI. Abd: Bowel sounds present. Soft, NT/ND without hepatosplenomegaly Ext: No lower extremity edema. Radial, PT, and DP pulses are 2+ bilaterally Skin: Warm and dry without rash. Neuro: CNIII-XII intact. Strength and fine-touch sensation intact in upper and lower extremities bilaterally. Psych: Normal mood and affect.  EKG:  Normal sinus rhythm without abnormality.  Lab Results  Component Value  Date   WBC 6.1 06/09/2020   HGB 12.1 06/09/2020   HCT 38.1 06/09/2020   MCV 92.3 06/09/2020   PLT 265 06/09/2020    Lab Results  Component Value Date   NA 141 06/09/2020   K 3.2 (L) 06/09/2020   CL 109 06/09/2020   CO2 24 06/09/2020   BUN 16 06/09/2020   CREATININE 1.08 (H) 06/09/2020   GLUCOSE 90 06/09/2020   ALT 30 04/10/2018    Lab Results  Component Value Date   CHOL 200 (H) 09/25/2018   HDL 56 09/25/2018   LDLCALC 116 (H) 09/25/2018   LDLDIRECT 128.7 01/29/2009   TRIG 139 09/25/2018   CHOLHDL 3 01/29/2009    --------------------------------------------------------------------------------------------------  ASSESSMENT AND PLAN: Chest pain and dyspnea on exertion: Pain is atypical, usually at rest.  Cardiac risk factors include hyperlipidemia and tobacco use.  Exam and EKG today are unremarkable.  Stress test in 2016 was normal.  We have discussed further evaluation options and have agreed to obtain a coronary CTA and echocardiogram.  If symptoms persist and aforementioned workup is unrevealing, we will need to consider ambulatory cardiac monitoring to exclude paroxysmal arrhythmia  leading to symptoms.  Lightheadedness: Symptoms are sporadic and not associated with palpitations or chest pain.  Interesting, BP seems to be higher when Cindy Pratt feels lightheaded.  Orthostatic vital signs negative today.  We will begin with CTA and echo, as abobe.  Hyperlipidemia: If there is evidence of ASCVD on CTA, statin therapy will need to be considered in the future.  Follow-up: Return to clinic after completion of echo and coronary CTA.  Nelva Bush, MD 06/17/2021 11:52 AM

## 2021-06-17 NOTE — Patient Instructions (Signed)
Medication Instructions:   Your physician recommends that you continue on your current medications as directed. Please refer to the Current Medication list given to you today.  *If you need a refill on your cardiac medications before your next appointment, please call your pharmacy*   Lab Work:  BMET today  If you have labs (blood work) drawn today and your tests are completely normal, you will receive your results only by: MyChart Message (if you have MyChart) OR A paper copy in the mail If you have any lab test that is abnormal or we need to change your treatment, we will call you to review the results.   Testing/Procedures:  1) Your physician has requested that you have an echocardiogram. Echocardiography is a painless test that uses sound waves to create images of your heart. It provides your doctor with information about the size and shape of your heart and how well your heart's chambers and valves are working. This procedure takes approximately one hour. There are no restrictions for this procedure.   2) Your cardiac CT is scheduled Thursday 07/02/21 at 12:30 PM at the following location:  [facility] [address] [place], [place] [address] [phone]  If scheduled at [facility], please arrive at the [facility] main entrance (entrance A) of [facility] 30 minutes prior to test start time. You can use the FREE valet parking offered at the main entrance (encouraged to control the heart rate for the test) Proceed to the [facility] Radiology Department (first floor) to check-in and test prep.  If scheduled at [facility], please arrive 15 mins early for check-in and test prep.  Please follow these instructions carefully (unless otherwise directed):   On the Night Before the Test: Be sure to Drink plenty of water. Do not consume any caffeinated/decaffeinated beverages or chocolate 12  hours prior to your test. Do not take any antihistamines 12 hours prior to your test.   On the Day of the Test: Drink plenty of water until 1 hour prior to the test. Do not eat any food 4 hours prior to the test. You may take your regular medications prior to the test.  Take metoprolol (Lopressor) two hours prior to test. FEMALES- please wear underwire-free bra if available, avoid dresses & tight clothing       After the Test: Drink plenty of water. After receiving IV contrast, you may experience a mild flushed feeling. This is normal. On occasion, you may experience a mild rash up to 24 hours after the test. This is not dangerous. If this occurs, you can take Benadryl 25 mg and increase your fluid intake. If you experience trouble breathing, this can be serious. If it is severe call 911 IMMEDIATELY. If it is mild, please call our office. If you take any of these medications: Glipizide/Metformin, Avandament, Glucavance, please do not take 48 hours after completing test unless otherwise instructed.  Please allow 2-4 weeks for scheduling of routine cardiac CTs. Some insurance companies require a pre-authorization which may delay scheduling of this test.   For non-scheduling related questions, please contact the cardiac imaging nurse navigator should you have any questions/concerns: Rockwell [place], Cardiac Imaging Nurse Navigator Larey Brick, Cardiac Imaging Nurse Navigator [facility] Direct Office Dial: [phone]   For scheduling needs, including cancellations and rescheduling, please call [place], [phone].   Follow-Up: At [facility], you and your health needs are our priority.  As part of our continuing mission to provide you with exceptional heart care, we have created designated Provider Care Teams.  These Care Teams include your primary Cardiologist (physician) and Advanced Practice Providers (APPs -  Physician Assistants and Nurse  Practitioners) who all work together to provide you with the care you need, when you need it.  We recommend signing up for the patient portal called "MyChart".  Sign up information is provided on this After Visit Summary.  MyChart is used to connect with patients for Virtual Visits (Telemedicine).  Patients are able to view lab/test results, encounter notes, upcoming appointments, etc.  Non-urgent messages can be sent to your provider as well.   To learn more about what you can do with MyChart, go to [email or web address].    Your next appointment:    Follow up after studies  The format for your next appointment:   In Person  Provider:   You may see Dr. Cristal Deer End or one of the following Advanced Practice Providers on your designated Care Team:   Nicolasa Ducking, NP Eula Listen, PA-C Marisue Ivan, PA-C Cadence [place], [place]

## 2021-06-18 ENCOUNTER — Encounter: Payer: Self-pay | Admitting: Internal Medicine

## 2021-06-18 DIAGNOSIS — R0609 Other forms of dyspnea: Secondary | ICD-10-CM | POA: Insufficient documentation

## 2021-06-18 DIAGNOSIS — R072 Precordial pain: Secondary | ICD-10-CM | POA: Insufficient documentation

## 2021-06-18 DIAGNOSIS — E785 Hyperlipidemia, unspecified: Secondary | ICD-10-CM | POA: Insufficient documentation

## 2021-06-18 DIAGNOSIS — R42 Dizziness and giddiness: Secondary | ICD-10-CM | POA: Insufficient documentation

## 2021-06-18 LAB — BASIC METABOLIC PANEL
BUN/Creatinine Ratio: 17 (ref 9–23)
BUN: 19 mg/dL (ref 6–24)
CO2: 23 mmol/L (ref 20–29)
Calcium: 9.5 mg/dL (ref 8.7–10.2)
Chloride: 108 mmol/L — ABNORMAL HIGH (ref 96–106)
Creatinine, Ser: 1.1 mg/dL — ABNORMAL HIGH (ref 0.57–1.00)
Glucose: 85 mg/dL (ref 70–99)
Potassium: 4.2 mmol/L (ref 3.5–5.2)
Sodium: 142 mmol/L (ref 134–144)
eGFR: 59 mL/min/{1.73_m2} — ABNORMAL LOW (ref >59)

## 2021-06-25 ENCOUNTER — Encounter: Payer: Self-pay | Admitting: Psychiatry

## 2021-06-25 ENCOUNTER — Ambulatory Visit (INDEPENDENT_AMBULATORY_CARE_PROVIDER_SITE_OTHER): Payer: Medicare Other | Admitting: Psychiatry

## 2021-06-25 ENCOUNTER — Encounter: Payer: Self-pay | Admitting: Registered Nurse

## 2021-06-25 ENCOUNTER — Encounter: Payer: Medicare Other | Attending: Registered Nurse | Admitting: Registered Nurse

## 2021-06-25 ENCOUNTER — Other Ambulatory Visit: Payer: Self-pay

## 2021-06-25 VITALS — BP 102/68 | HR 64 | Temp 98.3°F | Wt 159.8 lb

## 2021-06-25 VITALS — BP 91/66 | HR 63 | Ht 64.0 in | Wt 160.6 lb

## 2021-06-25 DIAGNOSIS — Z5181 Encounter for therapeutic drug level monitoring: Secondary | ICD-10-CM | POA: Diagnosis present

## 2021-06-25 DIAGNOSIS — M542 Cervicalgia: Secondary | ICD-10-CM | POA: Diagnosis not present

## 2021-06-25 DIAGNOSIS — F3132 Bipolar disorder, current episode depressed, moderate: Secondary | ICD-10-CM | POA: Diagnosis not present

## 2021-06-25 DIAGNOSIS — F419 Anxiety disorder, unspecified: Secondary | ICD-10-CM | POA: Diagnosis not present

## 2021-06-25 DIAGNOSIS — M961 Postlaminectomy syndrome, not elsewhere classified: Secondary | ICD-10-CM | POA: Diagnosis present

## 2021-06-25 DIAGNOSIS — M797 Fibromyalgia: Secondary | ICD-10-CM | POA: Diagnosis present

## 2021-06-25 DIAGNOSIS — Z79899 Other long term (current) drug therapy: Secondary | ICD-10-CM

## 2021-06-25 DIAGNOSIS — M5412 Radiculopathy, cervical region: Secondary | ICD-10-CM

## 2021-06-25 DIAGNOSIS — G894 Chronic pain syndrome: Secondary | ICD-10-CM | POA: Diagnosis present

## 2021-06-25 DIAGNOSIS — M545 Low back pain, unspecified: Secondary | ICD-10-CM | POA: Diagnosis present

## 2021-06-25 DIAGNOSIS — G8929 Other chronic pain: Secondary | ICD-10-CM

## 2021-06-25 DIAGNOSIS — M7061 Trochanteric bursitis, right hip: Secondary | ICD-10-CM | POA: Diagnosis present

## 2021-06-25 DIAGNOSIS — F4310 Post-traumatic stress disorder, unspecified: Secondary | ICD-10-CM

## 2021-06-25 DIAGNOSIS — M546 Pain in thoracic spine: Secondary | ICD-10-CM | POA: Diagnosis present

## 2021-06-25 DIAGNOSIS — Z79891 Long term (current) use of opiate analgesic: Secondary | ICD-10-CM | POA: Diagnosis present

## 2021-06-25 MED ORDER — LAMOTRIGINE 25 MG PO TABS: 25.0000 mg | ORAL_TABLET | Freq: Every day | ORAL | 1 refills | Status: DC

## 2021-06-25 MED ORDER — MORPHINE SULFATE ER 30 MG PO TBCR: 30.0000 mg | EXTENDED_RELEASE_TABLET | Freq: Three times a day (TID) | ORAL | 0 refills | Status: DC

## 2021-06-25 MED ORDER — TRAMADOL HCL 50 MG PO TABS: 50.0000 mg | ORAL_TABLET | Freq: Three times a day (TID) | ORAL | 2 refills | Status: DC | PRN
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
## 2021-06-26 ENCOUNTER — Telehealth: Payer: Self-pay | Admitting: Psychiatry

## 2021-06-26 LAB — LIPID PANEL
Chol/HDL Ratio: 3 ratio (ref 0.0–4.4)
Cholesterol, Total: 198 mg/dL (ref 100–199)
HDL: 67 mg/dL (ref >39)
LDL Chol Calc (NIH): 116 mg/dL — ABNORMAL HIGH (ref 0–99)
Triglycerides: 85 mg/dL (ref 0–149)
VLDL Cholesterol Cal: 15 mg/dL (ref 5–40)

## 2021-06-26 LAB — PROLACTIN: Prolactin: 158 ng/mL — ABNORMAL HIGH (ref 4.8–23.3)

## 2021-06-26 LAB — TSH: TSH: 2.74 u[IU]/mL (ref 0.450–4.500)

## 2021-06-26 LAB — HEMOGLOBIN A1C
Est. average glucose Bld gHb Est-mCnc: 114 mg/dL
Hgb A1c MFr Bld: 5.6 % (ref 4.8–5.6)

## 2021-06-26 NOTE — Telephone Encounter (Signed)
Attempted to contact patient to discuss abnormal labs-prolactin level being high.  Left a message for patient to call me back.

## 2021-06-29 ENCOUNTER — Telehealth: Payer: Self-pay | Admitting: Psychiatry

## 2021-06-29 DIAGNOSIS — F4310 Post-traumatic stress disorder, unspecified: Secondary | ICD-10-CM

## 2021-06-29 MED ORDER — RISPERIDONE 2 MG PO TABS: 1.0000 mg | ORAL_TABLET | Freq: Every day | ORAL | 0 refills | Status: DC

## 2021-06-29 NOTE — Telephone Encounter (Signed)
Returned call to patient.  Discussed prolactin level very high-158.  Likely due to risperidone however or other causes needs to be ruled out.  Reduce risperidone to 1 mg.  Patient advised to contact primary care provider for further work-up as needed.  Could repeat prolactin level in 6 to 8 weeks from now.  Further medication changes can be made when she returns for a follow-up visit.

## 2021-07-01 ENCOUNTER — Telehealth (HOSPITAL_COMMUNITY): Payer: Self-pay | Admitting: Emergency Medicine

## 2021-07-01 NOTE — Telephone Encounter (Signed)
Attempted to call patient regarding upcoming cardiac CT appointment. °Left message on voicemail with name and callback number °Gloriann Riede RN Navigator Cardiac Imaging °[facility] °[phone] Office °[phone] Cell ° °

## 2021-07-01 NOTE — Telephone Encounter (Signed)
Reaching out to patient to offer assistance regarding upcoming cardiac imaging study; pt verbalizes understanding of appt date/time, parking situation and where to check in, pre-test NPO status and medications ordered, and verified current allergies; name and call back number provided for further questions should they arise Abir Eroh RN Navigator Cardiac Imaging [facility] [phone] office [phone] cell  50mg metoprolol tartrate  Denies iv issues  

## 2021-07-02 ENCOUNTER — Ambulatory Visit
Admission: RE | Admit: 2021-07-02 | Discharge: 2021-07-02 | Disposition: A | Discharge status: Home / Self Care | Payer: Medicare Other | Source: Ambulatory Visit | Attending: Internal Medicine | Admitting: Internal Medicine

## 2021-07-02 ENCOUNTER — Ambulatory Visit (INDEPENDENT_AMBULATORY_CARE_PROVIDER_SITE_OTHER): Payer: Medicare Other | Admitting: Licensed Clinical Social Worker

## 2021-07-02 ENCOUNTER — Other Ambulatory Visit: Payer: Self-pay

## 2021-07-02 DIAGNOSIS — F4310 Post-traumatic stress disorder, unspecified: Secondary | ICD-10-CM | POA: Diagnosis not present

## 2021-07-02 DIAGNOSIS — R072 Precordial pain: Secondary | ICD-10-CM | POA: Insufficient documentation

## 2021-07-02 DIAGNOSIS — R0609 Other forms of dyspnea: Secondary | ICD-10-CM | POA: Diagnosis present

## 2021-07-02 DIAGNOSIS — F419 Anxiety disorder, unspecified: Secondary | ICD-10-CM | POA: Diagnosis not present

## 2021-07-02 DIAGNOSIS — F3132 Bipolar disorder, current episode depressed, moderate: Secondary | ICD-10-CM

## 2021-07-02 MED ORDER — NITROGLYCERIN 0.4 MG SL SUBL
0.4000 mg | SUBLINGUAL_TABLET | Freq: Once | SUBLINGUAL | Status: AC
  Administered 2021-07-02: 0.4 mg via SUBLINGUAL

## 2021-07-02 MED ORDER — IOHEXOL 350 MG/ML SOLN
75.0000 mL | Freq: Once | INTRAVENOUS | Status: AC | PRN
  Administered 2021-07-02: 75 mL via INTRAVENOUS

## 2021-07-02 NOTE — Progress Notes (Signed)
Virtual Visit via Audio Note  I connected with Cindy Pratt on 07/02/21 at  3:00 PM EDT by an audio enabled telemedicine application and verified that I am speaking with the correct person using two identifiers.  Location: Patient: home Provider: [facility]   I discussed the limitations of evaluation and management by telemedicine and the availability of in person appointments. The patient expressed understanding and agreed to proceed.   I discussed the assessment and treatment plan with the patient. The patient was provided an opportunity to ask questions and all were answered. The patient agreed with the plan and demonstrated an understanding of the instructions.   The patient was advised to call back or seek an in-person evaluation if the symptoms worsen or if the condition fails to improve as anticipated.  I provided 45 minutes of non-face-to-face time during this encounter.   Cindy Nancarrow R Michalle Rademaker, LCSW   THERAPIST PROGRESS NOTE  Session Time: 3-345p  Participation Level: Active  Behavioral Response: NAAlertAnxious and Depressed  Type of Therapy: Individual Therapy  Treatment Goals addressed:  Problem: Reduce the negative impact trauma related symptoms have on social, occupational, and family functioning. Goal: LTG: Reduce frequency, intensity, and duration of PTSD symptoms so daily functioning is improved: Input needed on appropriate metric.  pt self report Outcome: Progressing Goal: STG: Practice interpersonal effectiveness skills 7 times per week for the next 16 weeks Outcome: Progressing Interventions:  Intervention: REVIEW WITH Cindy Pratt THEIR PROGRESS AND DEVELOP A PLAN FOR CONTINUED PRACTICE OUTSIDE OF INDIVIDUAL THERAPY SESSIONS Intervention: WORK WITH Cindy Pratt TO IDENTIFY THE MAJOR COMPONENTS OF A RECENT EPISODE OF DEPRESSION: PHYSICAL SYMPTOMS, MAJOR THOUGHTS AND IMAGES, AND MAJOR BEHAVIORS THEY EXPERIENCED Intervention: Work with patient to identify the major components of a  recent episode of anxiety: physical symptoms, major thoughts and images, and major behaviors they experienced Summary: Cindy Pratt is a 55 y.o. female who presents with improving symptoms related to PTSD. Pt reports that overall mood has been stable and that she is managing overall situational stressors better.  Pt reports that she still has her ex husband, daughter, daughter's partner living in the home and her ex husband is the only person that is able to pay the bills. Pt reports that she feels "stuck". Allowed pt to identify anxiety and depression triggers and to reflect coping mechanisms and what has been working best to manage her symptoms.   Continued recommendations are as follows: self care behaviors, positive social engagements, focusing on overall work/home/life balance, and focusing on positive physical and emotional wellness.   Suicidal/Homicidal: No  Therapist Response: Pt is continuing to apply interventions learned in session into daily life situations. Pt is currently on track to meet goals utilizing interventions mentioned above. Personal growth and progress noted. Treatment to continue as indicated.   Plan: Return again in 4 weeks.  Diagnosis: Axis I: PTSD    Axis II: No diagnosis    Cindy Haber Placida Cambre, LCSW 07/02/2021

## 2021-07-03 ENCOUNTER — Telehealth: Payer: Self-pay | Admitting: *Deleted

## 2021-07-03 NOTE — Telephone Encounter (Signed)
-----   Message from Yvonne Kendall, MD sent at 07/03/2021  6:23 AM EDT ----- Please let Cindy Pratt know that her coronary CTA shows no evidence of narrowing or blockage in her heart arteries, which is good news.  Incidental note was made of two tiny nodules in the lungs, which are indeterminate.  Given her history of smoking in the past.  I recommend that we obtain a CT of the chest without contrast in 1 year.  I will forward the results to Cindy Pratt for his review as well.

## 2021-07-03 NOTE — Telephone Encounter (Signed)
Attempted to call pt. No answer. Lmtcb.  

## 2021-07-04 NOTE — Plan of Care (Signed)
  Problem: Reduce the negative impact trauma related symptoms have on social, occupational, and family functioning. Goal: LTG: Reduce frequency, intensity, and duration of PTSD symptoms so daily functioning is improved: Input needed on appropriate metric.  pt self report Outcome: Progressing Goal: STG: Practice interpersonal effectiveness skills 7 times per week for the next 16 weeks Outcome: Progressing Intervention: REVIEW WITH [name] THEIR PROGRESS AND DEVELOP A PLAN FOR CONTINUED PRACTICE OUTSIDE OF INDIVIDUAL THERAPY SESSIONS Intervention: WORK WITH [place] TO IDENTIFY THE MAJOR COMPONENTS OF A RECENT EPISODE OF DEPRESSION: PHYSICAL SYMPTOMS, MAJOR THOUGHTS AND IMAGES, AND MAJOR BEHAVIORS THEY EXPERIENCED Intervention: Work with patient to identify the major components of a recent episode of anxiety: physical symptoms, major thoughts and images, and major behaviors they experienced

## 2021-07-07 ENCOUNTER — Telehealth: Payer: Self-pay | Admitting: *Deleted

## 2021-07-07 DIAGNOSIS — R911 Solitary pulmonary nodule: Secondary | ICD-10-CM

## 2021-07-07 NOTE — Telephone Encounter (Signed)
-----   Message from Yvonne Kendall, MD sent at 07/03/2021  6:23 AM EDT ----- Please let Cindy Pratt know that her coronary CTA shows no evidence of narrowing or blockage in her heart arteries, which is good news.  Incidental note was made of two tiny nodules in the lungs, which are indeterminate.  Given her history of smoking in the past.  I recommend that we obtain a CT of the chest without contrast in 1 year.  I will forward the results to Dr. Sampson Goon for his review as well.

## 2021-07-07 NOTE — Telephone Encounter (Signed)
Please see subsequent message.

## 2021-07-07 NOTE — Telephone Encounter (Signed)
The patient has been notified of the result and verbalized understanding.  All questions (if any) were answered. CT of the chest without contrast has been ordered for 1 year.

## 2021-07-09 ENCOUNTER — Ambulatory Visit: Payer: Medicare Other | Admitting: Psychiatry

## 2021-07-15 ENCOUNTER — Ambulatory Visit: Payer: Medicare Other | Admitting: Internal Medicine

## 2021-07-22 ENCOUNTER — Other Ambulatory Visit: Payer: Medicare Other

## 2021-07-24 ENCOUNTER — Other Ambulatory Visit: Payer: Self-pay | Admitting: Registered Nurse

## 2021-07-28 ENCOUNTER — Telehealth: Payer: Self-pay | Admitting: *Deleted

## 2021-07-28 NOTE — Telephone Encounter (Signed)
-----   Message from Kendrick Fries, [place] sent at 07/28/2021  9:09 AM EST ----- Pt f/u tomorrow with Brion Aliment. Pt f/u CT/ECHO. Pt no showed for echo. Please advise if ok for pt to keep appointment.

## 2021-07-28 NOTE — Telephone Encounter (Signed)
Left voicemail message requesting patient to call back for review of ordered testing and her appointment we have scheduled for tomorrow.

## 2021-07-29 ENCOUNTER — Ambulatory Visit: Payer: Medicare Other | Admitting: Nurse Practitioner

## 2021-07-29 NOTE — Telephone Encounter (Signed)
The patient's 07/29/21 appointment with Ward Givens, NP was cancelled by the patient. Comments state she will c/b to reschedule.

## 2021-08-03 ENCOUNTER — Other Ambulatory Visit: Payer: Self-pay | Admitting: Specialist

## 2021-08-03 ENCOUNTER — Other Ambulatory Visit (HOSPITAL_COMMUNITY): Payer: Self-pay | Admitting: Specialist

## 2021-08-03 DIAGNOSIS — R911 Solitary pulmonary nodule: Secondary | ICD-10-CM

## 2021-08-04 ENCOUNTER — Other Ambulatory Visit: Payer: Self-pay

## 2021-08-04 ENCOUNTER — Ambulatory Visit (INDEPENDENT_AMBULATORY_CARE_PROVIDER_SITE_OTHER): Payer: Medicare Other | Admitting: Licensed Clinical Social Worker

## 2021-08-04 DIAGNOSIS — F4310 Post-traumatic stress disorder, unspecified: Secondary | ICD-10-CM | POA: Diagnosis not present

## 2021-08-04 NOTE — Progress Notes (Signed)
Virtual Visit via Audio Note  I connected with Cindy Pratt on 08/04/21 at  4:00 PM EST by an audio enabled telemedicine application and verified that I am speaking with the correct person using two identifiers.  Location: Patient: home Provider: [facility]   I discussed the limitations of evaluation and management by telemedicine and the availability of in person appointments. The patient expressed understanding and agreed to proceed.   I discussed the assessment and treatment plan with the patient. The patient was provided an opportunity to ask questions and all were answered. The patient agreed with the plan and demonstrated an understanding of the instructions.   The patient was advised to call back or seek an in-person evaluation if the symptoms worsen or if the condition fails to improve as anticipated.  I provided 35 minutes of non-face-to-face time during this encounter.   Cindy Pratt R Cindy Bonsignore, LCSW   THERAPIST PROGRESS NOTE  Session Time: 4-435p  Participation Level: Active  Behavioral Response: NAAlertAnxious and Depressed  Type of Therapy: Individual Therapy  Treatment Goals addressed:  Problem: Reduce the negative impact trauma related symptoms have on social, occupational, and family functioning. Goal: LTG: Reduce frequency, intensity, and duration of PTSD symptoms so daily functioning is improved: Input needed on appropriate metric.  pt self report Outcome: Progressing  Goal: STG: Practice interpersonal effectiveness skills 7 times per week for the next 16 weeks Outcome: Progressing  Interventions:  Intervention: REVIEW WITH Cindy Pratt THEIR PROGRESS AND DEVELOP A PLAN FOR CONTINUED PRACTICE OUTSIDE OF INDIVIDUAL THERAPY SESSIONS  Intervention: WORK WITH Cindy Pratt TO IDENTIFY THE MAJOR COMPONENTS OF A RECENT EPISODE OF DEPRESSION: PHYSICAL SYMPTOMS, MAJOR THOUGHTS AND IMAGES, AND MAJOR BEHAVIORS THEY EXPERIENCED  Intervention: Work with patient to identify the major  components of a recent episode of anxiety: physical symptoms, major thoughts and images, and major behaviors they experienced  Summary: Cindy Pratt is a 55 y.o. female who presents with improving symptoms related to PTSD. Pt reports that overall mood has been stable and that she is managing overall situational stressors better. Pt still coping with chronic pain which is hard on pt at times. Pt reports good quantity of sleep--finds herself sleeping in every day.  Allowed pt to explore and express thoughts and feelings associated with recent life situations and external stressors. Pt reports that she went to get evaluation for TMS and wasn't allowed because of a Bipolar 1 diagnosis. Pt states "I haven't had a manic episode since the 80's, that needs to be taken from my record". Discussed how this is a difficult dx to change because evidence of a manic episode will always be evidence of a manic episode and unless you can prove it was triggered by something else then its typically an underlying mood disorder. Encouraged pt to talk with Dr. Elna Breslow for more clarification at her next appt. Pt continues to struggle to get her house clean--encouraged pt to take it one step at a time and accomplish smaller tasks that will add up to be bigger goals she has set for household projects.   Continued recommendations are as follows: self care behaviors, positive social engagements, focusing on overall work/home/life balance, and focusing on positive physical and emotional wellness.   Suicidal/Homicidal: No  Therapist Response: Pt is continuing to apply interventions learned in session into daily life situations. Pt is currently on track to meet goals utilizing interventions mentioned above. Personal growth and progress noted. Treatment to continue as indicated.   Plan: Return again in 4 weeks.  Diagnosis: Axis I: PTSD    Axis II: No diagnosis35 Cindy Pratt R Cindy Kisner, LCSW 08/04/2021

## 2021-08-05 NOTE — Plan of Care (Signed)
  Problem: Reduce the negative impact trauma related symptoms have on social, occupational, and family functioning. Goal: LTG: Reduce frequency, intensity, and duration of PTSD symptoms so daily functioning is improved: Input needed on appropriate metric.  pt self report Outcome: Progressing Goal: STG: Practice interpersonal effectiveness skills 7 times per week for the next 16 weeks Outcome: Progressing

## 2021-08-19 ENCOUNTER — Encounter: Payer: Medicare Other | Attending: Registered Nurse | Admitting: Registered Nurse

## 2021-08-19 ENCOUNTER — Other Ambulatory Visit: Payer: Self-pay

## 2021-08-19 ENCOUNTER — Encounter: Payer: Self-pay | Admitting: Registered Nurse

## 2021-08-19 ENCOUNTER — Other Ambulatory Visit: Payer: Self-pay | Admitting: Registered Nurse

## 2021-08-19 VITALS — BP 103/69 | HR 60 | Temp 98.3°F | Ht 64.0 in | Wt 158.4 lb

## 2021-08-19 DIAGNOSIS — M792 Neuralgia and neuritis, unspecified: Secondary | ICD-10-CM

## 2021-08-19 DIAGNOSIS — M5412 Radiculopathy, cervical region: Secondary | ICD-10-CM | POA: Diagnosis not present

## 2021-08-19 DIAGNOSIS — M545 Low back pain, unspecified: Secondary | ICD-10-CM | POA: Diagnosis present

## 2021-08-19 DIAGNOSIS — M797 Fibromyalgia: Secondary | ICD-10-CM | POA: Diagnosis present

## 2021-08-19 DIAGNOSIS — M542 Cervicalgia: Secondary | ICD-10-CM | POA: Insufficient documentation

## 2021-08-19 DIAGNOSIS — M7061 Trochanteric bursitis, right hip: Secondary | ICD-10-CM | POA: Insufficient documentation

## 2021-08-19 DIAGNOSIS — M546 Pain in thoracic spine: Secondary | ICD-10-CM | POA: Insufficient documentation

## 2021-08-19 DIAGNOSIS — G894 Chronic pain syndrome: Secondary | ICD-10-CM

## 2021-08-19 DIAGNOSIS — G8929 Other chronic pain: Secondary | ICD-10-CM | POA: Diagnosis present

## 2021-08-19 DIAGNOSIS — M2550 Pain in unspecified joint: Secondary | ICD-10-CM | POA: Insufficient documentation

## 2021-08-19 DIAGNOSIS — M961 Postlaminectomy syndrome, not elsewhere classified: Secondary | ICD-10-CM

## 2021-08-19 MED ORDER — TRAMADOL HCL 50 MG PO TABS: 50.0000 mg | ORAL_TABLET | Freq: Three times a day (TID) | ORAL | 2 refills | Status: DC | PRN

## 2021-08-19 MED ORDER — MORPHINE SULFATE ER 30 MG PO TBCR: 30.0000 mg | EXTENDED_RELEASE_TABLET | Freq: Three times a day (TID) | ORAL | 0 refills | Status: DC
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
## 2021-08-20 ENCOUNTER — Other Ambulatory Visit: Payer: Self-pay | Admitting: Psychiatry

## 2021-08-20 ENCOUNTER — Ambulatory Visit (INDEPENDENT_AMBULATORY_CARE_PROVIDER_SITE_OTHER): Payer: Medicare Other | Admitting: Psychiatry

## 2021-08-20 ENCOUNTER — Ambulatory Visit: Payer: Medicare Other | Admitting: Registered Nurse

## 2021-08-20 ENCOUNTER — Ambulatory Visit: Payer: Medicare Other | Admitting: Psychiatry

## 2021-08-20 ENCOUNTER — Encounter: Payer: Self-pay | Admitting: Psychiatry

## 2021-08-20 VITALS — BP 105/69 | HR 69 | Temp 98.5°F | Ht 63.75 in | Wt 158.8 lb

## 2021-08-20 DIAGNOSIS — E221 Hyperprolactinemia: Secondary | ICD-10-CM | POA: Diagnosis not present

## 2021-08-20 DIAGNOSIS — F4310 Post-traumatic stress disorder, unspecified: Secondary | ICD-10-CM

## 2021-08-20 DIAGNOSIS — F3132 Bipolar disorder, current episode depressed, moderate: Secondary | ICD-10-CM

## 2021-08-20 DIAGNOSIS — F419 Anxiety disorder, unspecified: Secondary | ICD-10-CM

## 2021-08-20 DIAGNOSIS — F3175 Bipolar disorder, in partial remission, most recent episode depressed: Secondary | ICD-10-CM

## 2021-08-20 MED ORDER — LAMOTRIGINE 25 MG PO TABS: 25.0000 mg | ORAL_TABLET | Freq: Every day | ORAL | 1 refills | Status: DC

## 2021-08-20 MED ORDER — LAMOTRIGINE 100 MG PO TABS
50.0000 mg | ORAL_TABLET | Freq: Two times a day (BID) | ORAL | 0 refills | Status: DC
Start: 2021-08-20 — End: 2021-11-17
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
## 2021-09-18 ENCOUNTER — Other Ambulatory Visit: Payer: Self-pay

## 2021-09-18 ENCOUNTER — Encounter: Payer: Medicare Other | Attending: Registered Nurse | Admitting: Registered Nurse

## 2021-09-18 ENCOUNTER — Encounter: Payer: Self-pay | Admitting: Registered Nurse

## 2021-09-18 VITALS — BP 112/75 | HR 72 | Ht 63.75 in | Wt 158.0 lb

## 2021-09-18 DIAGNOSIS — M79641 Pain in right hand: Secondary | ICD-10-CM | POA: Diagnosis present

## 2021-09-18 DIAGNOSIS — G894 Chronic pain syndrome: Secondary | ICD-10-CM | POA: Insufficient documentation

## 2021-09-18 DIAGNOSIS — M5412 Radiculopathy, cervical region: Secondary | ICD-10-CM | POA: Insufficient documentation

## 2021-09-18 DIAGNOSIS — M542 Cervicalgia: Secondary | ICD-10-CM | POA: Insufficient documentation

## 2021-09-18 DIAGNOSIS — M546 Pain in thoracic spine: Secondary | ICD-10-CM | POA: Insufficient documentation

## 2021-09-18 DIAGNOSIS — M545 Low back pain, unspecified: Secondary | ICD-10-CM | POA: Insufficient documentation

## 2021-09-18 DIAGNOSIS — Z5181 Encounter for therapeutic drug level monitoring: Secondary | ICD-10-CM | POA: Insufficient documentation

## 2021-09-18 DIAGNOSIS — M792 Neuralgia and neuritis, unspecified: Secondary | ICD-10-CM | POA: Diagnosis present

## 2021-09-18 DIAGNOSIS — Z79891 Long term (current) use of opiate analgesic: Secondary | ICD-10-CM | POA: Insufficient documentation

## 2021-09-18 DIAGNOSIS — M79642 Pain in left hand: Secondary | ICD-10-CM | POA: Diagnosis present

## 2021-09-18 DIAGNOSIS — M961 Postlaminectomy syndrome, not elsewhere classified: Secondary | ICD-10-CM | POA: Insufficient documentation

## 2021-09-18 DIAGNOSIS — M797 Fibromyalgia: Secondary | ICD-10-CM | POA: Insufficient documentation

## 2021-09-18 DIAGNOSIS — G8929 Other chronic pain: Secondary | ICD-10-CM | POA: Insufficient documentation

## 2021-09-18 MED ORDER — TRAMADOL HCL 50 MG PO TABS: 50.0000 mg | ORAL_TABLET | Freq: Three times a day (TID) | ORAL | 2 refills | Status: DC | PRN

## 2021-09-18 MED ORDER — MORPHINE SULFATE ER 30 MG PO TBCR: 30.0000 mg | EXTENDED_RELEASE_TABLET | Freq: Three times a day (TID) | ORAL | 0 refills | Status: DC
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
## 2021-09-18 NOTE — Patient Instructions (Signed)
Take your Tramadol three times a day as needed this week.  Please send a My- Chart Message next week with an update.

## 2021-09-21 ENCOUNTER — Ambulatory Visit (INDEPENDENT_AMBULATORY_CARE_PROVIDER_SITE_OTHER): Payer: Self-pay | Admitting: Licensed Clinical Social Worker

## 2021-09-21 ENCOUNTER — Other Ambulatory Visit: Payer: Self-pay

## 2021-09-21 ENCOUNTER — Telehealth: Payer: Self-pay | Admitting: Licensed Clinical Social Worker

## 2021-09-21 DIAGNOSIS — Z91199 Patient's noncompliance with other medical treatment and regimen due to unspecified reason: Secondary | ICD-10-CM

## 2021-09-21 NOTE — Progress Notes (Signed)
LCSW counselor tried to connect with patient for scheduled appointment via MyChart video text request x 2 and email request; also tried to connect via phone without success. LCSW counselor left message for patient to call office number to reschedule OPT appointment.   Attempt 1: 4:04pm Text and email:  Attempt 2: 4:08pm Text and email:  Attempt 3: 4:11pm phone call: left message voice mail  Attempt 4: 4:17pm.  Left message to call office to reschedule appt.

## 2021-09-21 NOTE — Telephone Encounter (Signed)
LCSW counselor tried to connect with patient for scheduled appointment via MyChart video text request x 2 and email request; also tried to connect via phone without success. LCSW counselor left message for patient to call office number to reschedule OPT appointment.  ° °Attempt 1: 4:04pm Text and email: ° °Attempt 2: 4:08pm Text and email: ° °Attempt 3: 4:11pm phone call: left message voice mail ° °Attempt 4: 4:17pm.  Left message to call office to reschedule appt.  ° °

## 2021-09-24 LAB — DRUG TOX MONITOR 1 W/CONF, ORAL FLD
Amphetamines: NEGATIVE ng/mL (ref <10)
Barbiturates: NEGATIVE ng/mL (ref <10)
Benzodiazepines: NEGATIVE ng/mL (ref <0.50)
Buprenorphine: NEGATIVE ng/mL (ref <0.10)
Cocaine: NEGATIVE ng/mL (ref <5.0)
Codeine: NEGATIVE ng/mL (ref <2.5)
Dihydrocodeine: NEGATIVE ng/mL (ref <2.5)
Fentanyl: NEGATIVE ng/mL (ref <0.10)
Heroin Metabolite: NEGATIVE ng/mL (ref <1.0)
Hydrocodone: NEGATIVE ng/mL (ref <2.5)
Hydromorphone: NEGATIVE ng/mL (ref <2.5)
MARIJUANA: NEGATIVE ng/mL (ref <2.5)
MDMA: NEGATIVE ng/mL (ref <10)
Meprobamate: NEGATIVE ng/mL (ref <2.5)
Methadone: NEGATIVE ng/mL (ref <5.0)
Morphine: 50 ng/mL — ABNORMAL HIGH (ref <2.5)
Nicotine Metabolite: NEGATIVE ng/mL (ref <5.0)
Norhydrocodone: NEGATIVE ng/mL (ref <2.5)
Noroxycodone: NEGATIVE ng/mL (ref <2.5)
Opiates: POSITIVE ng/mL — AB (ref <2.5)
Oxycodone: NEGATIVE ng/mL (ref <2.5)
Oxymorphone: NEGATIVE ng/mL (ref <2.5)
Phencyclidine: NEGATIVE ng/mL (ref <10)
Tapentadol: NEGATIVE ng/mL (ref <5.0)
Tramadol: >500 ng/mL — ABNORMAL HIGH (ref <5.0)
Tramadol: POSITIVE ng/mL — AB (ref <5.0)
Zolpidem: NEGATIVE ng/mL (ref <5.0)

## 2021-09-24 LAB — DRUG TOX ALC METAB W/CON, ORAL FLD: Alcohol Metabolite: NEGATIVE ng/mL (ref <25)

## 2021-09-28 ENCOUNTER — Encounter: Payer: Self-pay | Admitting: Registered Nurse

## 2021-09-29 ENCOUNTER — Telehealth: Payer: Self-pay | Admitting: *Deleted

## 2021-09-29 NOTE — Telephone Encounter (Signed)
Oral swab drug screen was consistent for prescribed medications.  ?

## 2021-09-29 NOTE — Telephone Encounter (Signed)
Error

## 2021-10-09 ENCOUNTER — Other Ambulatory Visit: Payer: Self-pay

## 2021-10-09 ENCOUNTER — Encounter: Payer: Medicare Other | Attending: Registered Nurse | Admitting: Physical Medicine & Rehabilitation

## 2021-10-09 ENCOUNTER — Encounter: Payer: Self-pay | Admitting: Physical Medicine & Rehabilitation

## 2021-10-09 VITALS — BP 117/77 | HR 69 | Temp 98.6°F | Ht 63.7 in | Wt 159.0 lb

## 2021-10-09 DIAGNOSIS — M79642 Pain in left hand: Secondary | ICD-10-CM | POA: Diagnosis present

## 2021-10-09 DIAGNOSIS — M79641 Pain in right hand: Secondary | ICD-10-CM | POA: Diagnosis present

## 2021-10-09 MED ORDER — METHYLPREDNISOLONE 4 MG PO TBPK: ORAL_TABLET | ORAL | 0 refills | Status: DC

## 2021-10-09 NOTE — Patient Instructions (Signed)
Please buy wrist splints both right and left , to be worn all night
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
## 2021-10-16 ENCOUNTER — Telehealth: Payer: Self-pay | Admitting: Registered Nurse

## 2021-10-16 MED ORDER — MORPHINE SULFATE ER 30 MG PO TBCR: 30.0000 mg | EXTENDED_RELEASE_TABLET | Freq: Three times a day (TID) | ORAL | 0 refills | Status: DC

## 2021-10-16 NOTE — Telephone Encounter (Signed)
PMP was Reviewed. Morphine e-scribed.  Message sent to [name] via My-Chart

## 2021-10-19 ENCOUNTER — Other Ambulatory Visit: Payer: Self-pay | Admitting: Registered Nurse

## 2021-10-19 ENCOUNTER — Other Ambulatory Visit: Payer: Self-pay | Admitting: Psychiatry

## 2021-10-19 ENCOUNTER — Telehealth: Payer: Self-pay | Admitting: Registered Nurse

## 2021-10-19 ENCOUNTER — Other Ambulatory Visit: Payer: Self-pay | Admitting: Physical Medicine & Rehabilitation

## 2021-10-19 DIAGNOSIS — F4310 Post-traumatic stress disorder, unspecified: Secondary | ICD-10-CM

## 2021-10-19 MED ORDER — MORPHINE SULFATE ER 30 MG PO TBCR: 30.0000 mg | EXTENDED_RELEASE_TABLET | Freq: Three times a day (TID) | ORAL | 0 refills | Status: DC
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
## 2021-11-03 ENCOUNTER — Ambulatory Visit: Payer: Medicare Other | Admitting: Psychiatry

## 2021-11-16 ENCOUNTER — Encounter: Payer: Medicare Other | Attending: Registered Nurse | Admitting: Registered Nurse

## 2021-11-16 ENCOUNTER — Other Ambulatory Visit: Payer: Self-pay | Admitting: Psychiatry

## 2021-11-16 ENCOUNTER — Encounter: Payer: Self-pay | Admitting: Registered Nurse

## 2021-11-16 ENCOUNTER — Other Ambulatory Visit: Payer: Self-pay

## 2021-11-16 VITALS — BP 102/67 | HR 73 | Ht 63.7 in | Wt 157.6 lb

## 2021-11-16 DIAGNOSIS — M792 Neuralgia and neuritis, unspecified: Secondary | ICD-10-CM | POA: Diagnosis present

## 2021-11-16 DIAGNOSIS — Z79891 Long term (current) use of opiate analgesic: Secondary | ICD-10-CM | POA: Insufficient documentation

## 2021-11-16 DIAGNOSIS — F4310 Post-traumatic stress disorder, unspecified: Secondary | ICD-10-CM

## 2021-11-16 DIAGNOSIS — G894 Chronic pain syndrome: Secondary | ICD-10-CM | POA: Diagnosis present

## 2021-11-16 DIAGNOSIS — M961 Postlaminectomy syndrome, not elsewhere classified: Secondary | ICD-10-CM | POA: Insufficient documentation

## 2021-11-16 DIAGNOSIS — M542 Cervicalgia: Secondary | ICD-10-CM | POA: Diagnosis present

## 2021-11-16 DIAGNOSIS — M2550 Pain in unspecified joint: Secondary | ICD-10-CM | POA: Insufficient documentation

## 2021-11-16 DIAGNOSIS — E221 Hyperprolactinemia: Secondary | ICD-10-CM

## 2021-11-16 DIAGNOSIS — Z5181 Encounter for therapeutic drug level monitoring: Secondary | ICD-10-CM | POA: Diagnosis present

## 2021-11-16 DIAGNOSIS — M5412 Radiculopathy, cervical region: Secondary | ICD-10-CM | POA: Diagnosis present

## 2021-11-16 DIAGNOSIS — M545 Low back pain, unspecified: Secondary | ICD-10-CM | POA: Insufficient documentation

## 2021-11-16 DIAGNOSIS — M797 Fibromyalgia: Secondary | ICD-10-CM | POA: Diagnosis present

## 2021-11-16 DIAGNOSIS — G8929 Other chronic pain: Secondary | ICD-10-CM | POA: Insufficient documentation

## 2021-11-16 MED ORDER — MORPHINE SULFATE ER 30 MG PO TBCR: 30.0000 mg | EXTENDED_RELEASE_TABLET | Freq: Three times a day (TID) | ORAL | 0 refills | Status: DC

## 2021-11-16 MED ORDER — TRAMADOL HCL 50 MG PO TABS: 50.0000 mg | ORAL_TABLET | Freq: Three times a day (TID) | ORAL | 2 refills | Status: DC | PRN

## 2021-11-16 MED ORDER — TOPIRAMATE 50 MG PO TABS: 50.0000 mg | ORAL_TABLET | Freq: Two times a day (BID) | ORAL | 3 refills | Status: DC

## 2021-11-16 MED ORDER — TIZANIDINE HCL 4 MG PO TABS: 4.0000 mg | ORAL_TABLET | Freq: Two times a day (BID) | ORAL | 5 refills | Status: DC
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
## 2021-11-17 MED ORDER — LAMOTRIGINE 25 MG PO TABS: ORAL_TABLET | ORAL | 0 refills | Status: DC

## 2021-11-17 NOTE — Telephone Encounter (Signed)
Patient has a scheduled appointment in April.  We will provide her 1 more month supply of medication.  No refills will be provided if she does not keep that appointment. ?

## 2021-11-30 ENCOUNTER — Ambulatory Visit (INDEPENDENT_AMBULATORY_CARE_PROVIDER_SITE_OTHER): Payer: Self-pay | Admitting: Licensed Clinical Social Worker

## 2021-11-30 ENCOUNTER — Telehealth: Payer: Self-pay | Admitting: Psychiatry

## 2021-11-30 ENCOUNTER — Telehealth: Payer: Self-pay | Admitting: Licensed Clinical Social Worker

## 2021-11-30 DIAGNOSIS — Z91199 Patient's noncompliance with other medical treatment and regimen due to unspecified reason: Secondary | ICD-10-CM

## 2021-11-30 NOTE — Progress Notes (Signed)
LCSW counselor tried to connect with patient for scheduled appointment via MyChart video text request x 2 and email request; also tried to connect via phone without success. LCSW counselor left message for patient to call office number to reschedule OPT appointment.  ? ?Attempt 1: Text and email: 1:04p ? ?Attempt 2: Text and email: 1:08p ? ?Attempt 3: phone call: 1:16p left message ? ?Appointment will be coded as NS ? ? ?

## 2021-11-30 NOTE — Telephone Encounter (Signed)
LCSW counselor tried to connect with patient for scheduled appointment via MyChart video text request x 2 and email request; also tried to connect via phone without success. LCSW counselor left message for patient to call office number to reschedule OPT appointment.  ? ?Attempt 1: Text and email: 1:04p ? ?Attempt 2: Text and email: 1:08p ? ?Attempt 3: phone call: 1:16p left message ? ?Appointment will be coded as NS ? ? ?

## 2021-11-30 NOTE — Telephone Encounter (Signed)
Patient called requesting to reschedule her missed appointment from 1300 today. Stated she overslept. Denied having received a reminder call. This was her 3rd no-show and triggered a dismissal from the practice by policy. Therapist agreed to reconsider and she will be scheduled at next available opportunity for 1600 appointment.Marland Kitchen ?

## 2021-12-03 ENCOUNTER — Encounter: Payer: Medicare Other | Attending: Registered Nurse | Admitting: Physical Medicine & Rehabilitation

## 2021-12-03 ENCOUNTER — Encounter: Payer: Self-pay | Admitting: Physical Medicine & Rehabilitation

## 2021-12-03 VITALS — BP 100/67 | HR 75 | Ht 63.7 in | Wt 157.2 lb

## 2021-12-03 DIAGNOSIS — M79642 Pain in left hand: Secondary | ICD-10-CM | POA: Diagnosis not present

## 2021-12-03 DIAGNOSIS — M79641 Pain in right hand: Secondary | ICD-10-CM | POA: Diagnosis present

## 2021-12-03 MED ORDER — MORPHINE SULFATE ER 30 MG PO TBCR
30.0000 mg | EXTENDED_RELEASE_TABLET | Freq: Three times a day (TID) | ORAL | 0 refills | Status: DC
Start: 2021-12-03 — End: 2022-01-11

## 2021-12-03 NOTE — Patient Instructions (Signed)
Mild carpal tunnel on Left side ? ?Symptoms in right hand may be arthritis or related to fibromyalgia ?

## 2021-12-03 NOTE — Progress Notes (Signed)
EMG report scanned under media tab  ? ?Briefly this is a 56 yo female hx fibromyalgia with bilateral hand numbness tingling and pain.  No hx of diabetes,or hypothyroidism ? 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 ? ?Discussed Left carpal tunnel injection./  Pt will think about this prior to scheduling  ? ?Erick Colace M.D. ?[facility] ?Fellow Am Acad of [employer] and Rehab ?Diplomate Am Board of Electrodiagnostic Med ?Fellow Am Board of Interventional Pain ? ?

## 2021-12-04 ENCOUNTER — Encounter: Payer: Self-pay | Admitting: Psychiatry

## 2021-12-04 ENCOUNTER — Telehealth: Payer: Self-pay

## 2021-12-04 LAB — PROLACTIN: Prolactin: 74 ng/mL — ABNORMAL HIGH (ref 4.8–23.3)

## 2021-12-04 NOTE — Telephone Encounter (Signed)
Please check patients labwork ?

## 2021-12-04 NOTE — Telephone Encounter (Signed)
Reviewed and letter was sent electronically. Will route this to Dr. Elna Breslow so that she is aware.

## 2021-12-09 ENCOUNTER — Encounter: Payer: Self-pay | Admitting: Internal Medicine

## 2021-12-09 NOTE — Progress Notes (Signed)
Unable to contact patient to schedule echocardiogram, letter sent, order cancelled. 

## 2021-12-10 NOTE — Telephone Encounter (Signed)
Thank you , will discuss at her next visit. ?

## 2021-12-10 NOTE — Telephone Encounter (Signed)
Noted  

## 2021-12-11 ENCOUNTER — Telehealth: Payer: Self-pay | Admitting: Internal Medicine

## 2021-12-11 DIAGNOSIS — R0609 Other forms of dyspnea: Secondary | ICD-10-CM

## 2021-12-11 DIAGNOSIS — R072 Precordial pain: Secondary | ICD-10-CM

## 2021-12-11 NOTE — Telephone Encounter (Signed)
Attempted to schedule no ans no vm  

## 2021-12-11 NOTE — Telephone Encounter (Signed)
Please see telephone encounter 07/28/22.  ? ?Pt was a "no show" for echo that was scheduled 07/22/21.  ?Pt cancelled follow up with Ward Givens, NP on 07/29/21 - pt states she would c/b to r/s.  ? ?Original order for echo no longer active.  ?New order placed for echo so pt may be rescheduled.  ? ?Forwarding to scheduling. Pt will need echo and follow up scheduled.  ?

## 2021-12-11 NOTE — Telephone Encounter (Signed)
Patient states she is returning a call to schedule an echo, but I do not see any current orders. Is anyone able to assist with this? Thank you. ?

## 2021-12-14 ENCOUNTER — Encounter: Payer: Self-pay | Admitting: Psychiatry

## 2021-12-14 ENCOUNTER — Telehealth (INDEPENDENT_AMBULATORY_CARE_PROVIDER_SITE_OTHER): Payer: Medicare Other | Admitting: Psychiatry

## 2021-12-14 DIAGNOSIS — E221 Hyperprolactinemia: Secondary | ICD-10-CM

## 2021-12-14 DIAGNOSIS — F4310 Post-traumatic stress disorder, unspecified: Secondary | ICD-10-CM | POA: Diagnosis not present

## 2021-12-14 DIAGNOSIS — F3176 Bipolar disorder, in full remission, most recent episode depressed: Secondary | ICD-10-CM | POA: Diagnosis not present

## 2021-12-14 MED ORDER — RISPERIDONE 0.25 MG PO TABS: 0.2500 mg | ORAL_TABLET | Freq: Every day | ORAL | 0 refills | Status: DC

## 2021-12-14 MED ORDER — LAMOTRIGINE 25 MG PO TABS: ORAL_TABLET | ORAL | 0 refills | Status: DC

## 2021-12-14 MED ORDER — LAMOTRIGINE 100 MG PO TABS: ORAL_TABLET | ORAL | 0 refills | Status: DC

## 2021-12-14 MED ORDER — BUPROPION HCL ER (SR) 150 MG PO TB12: ORAL_TABLET | ORAL | 0 refills | Status: DC
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
## 2022-01-11 ENCOUNTER — Other Ambulatory Visit: Payer: Self-pay | Admitting: Physical Medicine & Rehabilitation

## 2022-01-14 ENCOUNTER — Encounter: Payer: Self-pay | Admitting: Registered Nurse

## 2022-01-14 ENCOUNTER — Encounter: Payer: Medicare Other | Attending: Registered Nurse | Admitting: Registered Nurse

## 2022-01-14 VITALS — BP 108/73 | HR 65 | Ht 63.7 in | Wt 154.8 lb

## 2022-01-14 DIAGNOSIS — G894 Chronic pain syndrome: Secondary | ICD-10-CM | POA: Diagnosis present

## 2022-01-14 DIAGNOSIS — M542 Cervicalgia: Secondary | ICD-10-CM | POA: Diagnosis present

## 2022-01-14 DIAGNOSIS — M5412 Radiculopathy, cervical region: Secondary | ICD-10-CM | POA: Diagnosis present

## 2022-01-14 DIAGNOSIS — Z5181 Encounter for therapeutic drug level monitoring: Secondary | ICD-10-CM | POA: Insufficient documentation

## 2022-01-14 DIAGNOSIS — M792 Neuralgia and neuritis, unspecified: Secondary | ICD-10-CM | POA: Diagnosis present

## 2022-01-14 DIAGNOSIS — M961 Postlaminectomy syndrome, not elsewhere classified: Secondary | ICD-10-CM | POA: Insufficient documentation

## 2022-01-14 DIAGNOSIS — Z79891 Long term (current) use of opiate analgesic: Secondary | ICD-10-CM | POA: Diagnosis present

## 2022-01-14 DIAGNOSIS — M797 Fibromyalgia: Secondary | ICD-10-CM | POA: Insufficient documentation

## 2022-01-14 DIAGNOSIS — G8929 Other chronic pain: Secondary | ICD-10-CM | POA: Diagnosis present

## 2022-01-14 DIAGNOSIS — M545 Low back pain, unspecified: Secondary | ICD-10-CM | POA: Diagnosis present

## 2022-01-14 MED ORDER — MORPHINE SULFATE ER 30 MG PO TBCR: 30.0000 mg | EXTENDED_RELEASE_TABLET | Freq: Three times a day (TID) | ORAL | 0 refills | Status: DC
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
## 2022-01-20 ENCOUNTER — Telehealth: Payer: Self-pay | Admitting: *Deleted

## 2022-01-20 ENCOUNTER — Telehealth (INDEPENDENT_AMBULATORY_CARE_PROVIDER_SITE_OTHER): Payer: Medicare Other | Admitting: Licensed Clinical Social Worker

## 2022-01-20 DIAGNOSIS — F4310 Post-traumatic stress disorder, unspecified: Secondary | ICD-10-CM

## 2022-01-20 LAB — TOXASSURE SELECT,+ANTIDEPR,UR

## 2022-01-20 NOTE — Telephone Encounter (Signed)
Urine drug screen for this encounter is consistent for prescribed medication 
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
## 2022-01-20 NOTE — Progress Notes (Signed)
   01/20/22 1635  GAD-7 Over the last 2 weeks, how often have you been bothered by the following problems?  1. Feeling Nervous, Anxious, or on Edge 1  2. Not Being Able to Stop or Control Worrying 0  3. Worrying Too Much About Different Things 0  4. Trouble Relaxing 0  5. Being So Restless it's Hard To Sit Still 0  6. Becoming Easily Annoyed or Irritable 0  7. Feeling Afraid As If Something Awful Might Happen 0  Total GAD-7 Score 1  Anxiety Difficulty  Difficulty At Work, Home, or Getting  Along With Others? Not difficult at all

## 2022-01-20 NOTE — Progress Notes (Signed)
   01/20/22 1634  Depression Scale- Over the past 2 weeks, how often have you been bothered by any of the following problems?  Little interest or pleasure in doing things 1  Feeling down, depressed, or hopeless (PHQ Adolescent also includes...irritable) 1  PHQ-2 Total Score 2  Trouble falling or staying asleep, or sleeping too much 0  Feeling tired or having little energy 3  Poor appetite or overeating (PHQ Adolescent also includes...weight loss) 3  Feeling bad about yourself - or that you are a failure or have let yourself or your family down 0  Trouble concentrating on things, such as reading the newspaper or watching television (PHQ Adolescent also includes...like school work) [employer] or speaking so slowly that other people could have noticed. Or the opposite - being so fidgety or restless that you have been moving around a lot more than usual 0  Thoughts that you would be better off dead, or of hurting yourself in some way 0  PHQ-9 Total Score 8  Depression Treatment  Depression Interventions/Treatment  Medication;Counseling;Currently on Treatment

## 2022-01-22 NOTE — Plan of Care (Signed)
  Problem: Reduce the negative impact trauma related symptoms have on social, occupational, and family functioning. Goal: LTG: Reduce frequency, intensity, and duration of PTSD symptoms so daily functioning is improved: Input needed on appropriate metric.  pt self report Outcome: Progressing Goal: STG: Practice interpersonal effectiveness skills 7 times per week for the next 16 weeks Outcome: Progressing Intervention: Assist with relaxation techniques, as appropriate (deep breathing exercises, meditation, guided imagery) Note: reviewed Intervention: Encourage self-care activities Note: reviewed Intervention: Encourage verbalization of feelings/concerns/expectations Note: Explored/supported

## 2022-01-27 ENCOUNTER — Encounter: Payer: Self-pay | Admitting: Internal Medicine

## 2022-01-27 NOTE — Progress Notes (Signed)
Unable to contact patient to schedule echocardiogram, letter sent, order cancelled. 

## 2022-02-08 ENCOUNTER — Telehealth: Payer: Self-pay | Admitting: Registered Nurse

## 2022-02-08 MED ORDER — MORPHINE SULFATE ER 30 MG PO TBCR: 30.0000 mg | EXTENDED_RELEASE_TABLET | Freq: Three times a day (TID) | ORAL | 0 refills | Status: DC

## 2022-02-08 MED ORDER — TRAMADOL HCL 50 MG PO TABS: 50.0000 mg | ORAL_TABLET | Freq: Three times a day (TID) | ORAL | 2 refills | Status: DC | PRN

## 2022-02-08 NOTE — Telephone Encounter (Signed)
PMP was Reviewed.  Morphine and Tramadol e-scribed today to accommodate scheduled appointment.  Cindy Pratt is aware of the above.

## 2022-02-09 ENCOUNTER — Ambulatory Visit: Payer: Medicare Other | Attending: Specialist

## 2022-02-10 ENCOUNTER — Encounter: Payer: Medicare Other | Admitting: Registered Nurse

## 2022-02-10 ENCOUNTER — Telehealth (INDEPENDENT_AMBULATORY_CARE_PROVIDER_SITE_OTHER): Payer: Self-pay | Admitting: Psychiatry

## 2022-02-10 DIAGNOSIS — F4310 Post-traumatic stress disorder, unspecified: Secondary | ICD-10-CM

## 2022-02-10 NOTE — Progress Notes (Signed)
No response to call or text or video invite  

## 2022-03-04 ENCOUNTER — Other Ambulatory Visit: Payer: Self-pay | Admitting: Psychiatry

## 2022-03-04 DIAGNOSIS — F4310 Post-traumatic stress disorder, unspecified: Secondary | ICD-10-CM

## 2022-03-04 DIAGNOSIS — E221 Hyperprolactinemia: Secondary | ICD-10-CM

## 2022-03-10 ENCOUNTER — Telehealth: Payer: Self-pay | Admitting: Registered Nurse

## 2022-03-10 ENCOUNTER — Encounter: Payer: Medicare Other | Admitting: Registered Nurse

## 2022-03-10 NOTE — Telephone Encounter (Signed)
PMP was Reviewed.  Cindy Pratt states she has medication for today.  She has car problem today, her appointment was changed to 03/11/2022.

## 2022-03-11 ENCOUNTER — Other Ambulatory Visit: Payer: Self-pay | Admitting: Psychiatry

## 2022-03-11 ENCOUNTER — Encounter: Payer: Medicare Other | Attending: Registered Nurse | Admitting: Registered Nurse

## 2022-03-11 ENCOUNTER — Encounter: Payer: Self-pay | Admitting: Registered Nurse

## 2022-03-11 VITALS — BP 113/76 | HR 74 | Wt 147.0 lb

## 2022-03-11 DIAGNOSIS — Z5181 Encounter for therapeutic drug level monitoring: Secondary | ICD-10-CM | POA: Diagnosis present

## 2022-03-11 DIAGNOSIS — G894 Chronic pain syndrome: Secondary | ICD-10-CM | POA: Diagnosis present

## 2022-03-11 DIAGNOSIS — M797 Fibromyalgia: Secondary | ICD-10-CM | POA: Diagnosis present

## 2022-03-11 DIAGNOSIS — M961 Postlaminectomy syndrome, not elsewhere classified: Secondary | ICD-10-CM | POA: Diagnosis present

## 2022-03-11 DIAGNOSIS — G5793 Unspecified mononeuropathy of bilateral lower limbs: Secondary | ICD-10-CM | POA: Insufficient documentation

## 2022-03-11 DIAGNOSIS — Z79891 Long term (current) use of opiate analgesic: Secondary | ICD-10-CM | POA: Insufficient documentation

## 2022-03-11 DIAGNOSIS — M542 Cervicalgia: Secondary | ICD-10-CM | POA: Insufficient documentation

## 2022-03-11 DIAGNOSIS — M5412 Radiculopathy, cervical region: Secondary | ICD-10-CM | POA: Diagnosis present

## 2022-03-11 DIAGNOSIS — M792 Neuralgia and neuritis, unspecified: Secondary | ICD-10-CM | POA: Insufficient documentation

## 2022-03-11 DIAGNOSIS — F3176 Bipolar disorder, in full remission, most recent episode depressed: Secondary | ICD-10-CM

## 2022-03-11 MED ORDER — TOPIRAMATE 50 MG PO TABS: 50.0000 mg | ORAL_TABLET | Freq: Two times a day (BID) | ORAL | 3 refills | Status: DC

## 2022-03-11 MED ORDER — MORPHINE SULFATE ER 30 MG PO TBCR: 30.0000 mg | EXTENDED_RELEASE_TABLET | Freq: Three times a day (TID) | ORAL | 0 refills | Status: DC
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
## 2022-03-17 ENCOUNTER — Encounter (HOSPITAL_COMMUNITY): Payer: Self-pay

## 2022-03-17 ENCOUNTER — Ambulatory Visit (INDEPENDENT_AMBULATORY_CARE_PROVIDER_SITE_OTHER): Payer: Self-pay | Admitting: Licensed Clinical Social Worker

## 2022-03-17 DIAGNOSIS — Z91199 Patient's noncompliance with other medical treatment and regimen due to unspecified reason: Secondary | ICD-10-CM

## 2022-03-17 NOTE — Progress Notes (Signed)
LCSW counselor tried to connect with patient for scheduled appointment via MyChart video text request x 2 and email request; also tried to connect via phone without success. LCSW counselor left message for patient to call office number to reschedule OPT appointment.   Attempt 1: Text and email: 4:06p  Attempt 2: Text and email: 4:12p  Attempt 3: phone call: 4:15p left message   This visit will be coded as No Show

## 2022-03-18 ENCOUNTER — Encounter (HOSPITAL_COMMUNITY): Payer: Self-pay | Admitting: Licensed Clinical Social Worker

## 2022-03-18 ENCOUNTER — Telehealth (HOSPITAL_COMMUNITY): Payer: Self-pay | Admitting: Psychiatry

## 2022-03-18 NOTE — Telephone Encounter (Signed)
Noted! Thank you

## 2022-03-18 NOTE — Telephone Encounter (Signed)
Per clinic policy, patient has been dismissed from the practice due to 4 no-shows for scheduled appointments. Certified letter to be mailed to address on file.

## 2022-04-06 ENCOUNTER — Encounter: Payer: Self-pay | Admitting: Registered Nurse

## 2022-04-06 ENCOUNTER — Other Ambulatory Visit: Payer: Self-pay

## 2022-04-06 ENCOUNTER — Emergency Department (HOSPITAL_COMMUNITY)
Admission: EM | Admit: 2022-04-06 | Discharge: 2022-04-07 | Discharge status: Against Medical Advice | Payer: Medicare Other | Attending: Emergency Medicine | Admitting: Emergency Medicine

## 2022-04-06 ENCOUNTER — Encounter: Payer: Medicare Other | Attending: Registered Nurse | Admitting: Registered Nurse

## 2022-04-06 VITALS — BP 119/80 | HR 68 | Ht 63.7 in | Wt 141.0 lb

## 2022-04-06 DIAGNOSIS — R109 Unspecified abdominal pain: Secondary | ICD-10-CM | POA: Diagnosis present

## 2022-04-06 DIAGNOSIS — M549 Dorsalgia, unspecified: Secondary | ICD-10-CM | POA: Insufficient documentation

## 2022-04-06 DIAGNOSIS — R1013 Epigastric pain: Secondary | ICD-10-CM | POA: Insufficient documentation

## 2022-04-06 DIAGNOSIS — M5412 Radiculopathy, cervical region: Secondary | ICD-10-CM | POA: Insufficient documentation

## 2022-04-06 DIAGNOSIS — M542 Cervicalgia: Secondary | ICD-10-CM | POA: Insufficient documentation

## 2022-04-06 DIAGNOSIS — J45909 Unspecified asthma, uncomplicated: Secondary | ICD-10-CM | POA: Diagnosis not present

## 2022-04-06 DIAGNOSIS — Z5181 Encounter for therapeutic drug level monitoring: Secondary | ICD-10-CM | POA: Insufficient documentation

## 2022-04-06 DIAGNOSIS — Z5321 Procedure and treatment not carried out due to patient leaving prior to being seen by health care provider: Secondary | ICD-10-CM | POA: Insufficient documentation

## 2022-04-06 DIAGNOSIS — Z7951 Long term (current) use of inhaled steroids: Secondary | ICD-10-CM | POA: Insufficient documentation

## 2022-04-06 DIAGNOSIS — M545 Low back pain, unspecified: Secondary | ICD-10-CM | POA: Insufficient documentation

## 2022-04-06 DIAGNOSIS — M792 Neuralgia and neuritis, unspecified: Secondary | ICD-10-CM | POA: Insufficient documentation

## 2022-04-06 DIAGNOSIS — G894 Chronic pain syndrome: Secondary | ICD-10-CM | POA: Insufficient documentation

## 2022-04-06 DIAGNOSIS — Z79899 Other long term (current) drug therapy: Secondary | ICD-10-CM | POA: Insufficient documentation

## 2022-04-06 DIAGNOSIS — G8929 Other chronic pain: Secondary | ICD-10-CM | POA: Insufficient documentation

## 2022-04-06 DIAGNOSIS — R110 Nausea: Secondary | ICD-10-CM | POA: Diagnosis not present

## 2022-04-06 DIAGNOSIS — G5793 Unspecified mononeuropathy of bilateral lower limbs: Secondary | ICD-10-CM | POA: Insufficient documentation

## 2022-04-06 DIAGNOSIS — M961 Postlaminectomy syndrome, not elsewhere classified: Secondary | ICD-10-CM | POA: Insufficient documentation

## 2022-04-06 DIAGNOSIS — M797 Fibromyalgia: Secondary | ICD-10-CM | POA: Insufficient documentation

## 2022-04-06 DIAGNOSIS — Z79891 Long term (current) use of opiate analgesic: Secondary | ICD-10-CM | POA: Insufficient documentation

## 2022-04-06 MED ORDER — TRAMADOL HCL 50 MG PO TABS: 50.0000 mg | ORAL_TABLET | Freq: Three times a day (TID) | ORAL | 2 refills | Status: DC | PRN

## 2022-04-06 MED ORDER — MORPHINE SULFATE ER 30 MG PO TBCR: 30.0000 mg | EXTENDED_RELEASE_TABLET | Freq: Three times a day (TID) | ORAL | 0 refills | Status: DC
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
## 2022-04-07 ENCOUNTER — Emergency Department (HOSPITAL_COMMUNITY): Payer: Medicare Other

## 2022-04-07 ENCOUNTER — Encounter (HOSPITAL_COMMUNITY): Payer: Self-pay | Admitting: *Deleted

## 2022-04-07 ENCOUNTER — Other Ambulatory Visit: Payer: Self-pay

## 2022-04-07 ENCOUNTER — Emergency Department (HOSPITAL_COMMUNITY)
Admission: EM | Admit: 2022-04-07 | Discharge: 2022-04-07 | Disposition: A | Discharge status: Home / Self Care | Payer: Medicare Other | Source: Home / Self Care | Attending: Emergency Medicine | Admitting: Emergency Medicine

## 2022-04-07 DIAGNOSIS — R1013 Epigastric pain: Secondary | ICD-10-CM | POA: Insufficient documentation

## 2022-04-07 DIAGNOSIS — Z79899 Other long term (current) drug therapy: Secondary | ICD-10-CM | POA: Insufficient documentation

## 2022-04-07 DIAGNOSIS — Z7951 Long term (current) use of inhaled steroids: Secondary | ICD-10-CM | POA: Insufficient documentation

## 2022-04-07 DIAGNOSIS — M545 Low back pain, unspecified: Secondary | ICD-10-CM | POA: Insufficient documentation

## 2022-04-07 DIAGNOSIS — J45909 Unspecified asthma, uncomplicated: Secondary | ICD-10-CM | POA: Insufficient documentation

## 2022-04-07 LAB — COMPREHENSIVE METABOLIC PANEL
ALT: 20 U/L (ref 0–44)
ALT: 17 U/L (ref 0–44)
AST: 18 U/L (ref 15–41)
AST: 15 U/L (ref 15–41)
Albumin: 3.6 g/dL (ref 3.5–5.0)
Albumin: 3.8 g/dL (ref 3.5–5.0)
Alkaline Phosphatase: 82 U/L (ref 38–126)
Alkaline Phosphatase: 84 U/L (ref 38–126)
Anion gap: 7 (ref 5–15)
Anion gap: 6 (ref 5–15)
BUN: 7 mg/dL (ref 6–20)
BUN: 17 mg/dL (ref 6–20)
CO2: 24 mmol/L (ref 22–32)
CO2: 26 mmol/L (ref 22–32)
Calcium: 9.5 mg/dL (ref 8.9–10.3)
Calcium: 9.5 mg/dL (ref 8.9–10.3)
Chloride: 110 mmol/L (ref 98–111)
Chloride: 110 mmol/L (ref 98–111)
Creatinine, Ser: 1.09 mg/dL — ABNORMAL HIGH (ref 0.44–1.00)
Creatinine, Ser: 0.95 mg/dL (ref 0.44–1.00)
GFR, Estimated: 59 mL/min — ABNORMAL LOW (ref >60)
GFR, Estimated: >60 mL/min (ref >60)
Glucose, Bld: 110 mg/dL — ABNORMAL HIGH (ref 70–99)
Glucose, Bld: 101 mg/dL — ABNORMAL HIGH (ref 70–99)
Potassium: 3.6 mmol/L (ref 3.5–5.1)
Potassium: 3.3 mmol/L — ABNORMAL LOW (ref 3.5–5.1)
Sodium: 141 mmol/L (ref 135–145)
Sodium: 142 mmol/L (ref 135–145)
Total Bilirubin: 0.5 mg/dL (ref 0.3–1.2)
Total Bilirubin: 0.5 mg/dL (ref 0.3–1.2)
Total Protein: 6.7 g/dL (ref 6.5–8.1)
Total Protein: 7.1 g/dL (ref 6.5–8.1)

## 2022-04-07 LAB — CBC
HCT: 40 % (ref 36.0–46.0)
Hemoglobin: 13.1 g/dL (ref 12.0–15.0)
MCH: 29.6 pg (ref 26.0–34.0)
MCHC: 32.8 g/dL (ref 30.0–36.0)
MCV: 90.5 fL (ref 80.0–100.0)
Platelets: 280 10*3/uL (ref 150–400)
RBC: 4.42 MIL/uL (ref 3.87–5.11)
RDW: 12.7 % (ref 11.5–15.5)
WBC: 10.9 10*3/uL — ABNORMAL HIGH (ref 4.0–10.5)
nRBC: 0 % (ref 0.0–0.2)

## 2022-04-07 LAB — CBC WITH DIFFERENTIAL/PLATELET
Abs Immature Granulocytes: 0.02 10*3/uL (ref 0.00–0.07)
Basophils Absolute: 0 10*3/uL (ref 0.0–0.1)
Basophils Relative: 1 %
Eosinophils Absolute: 0.1 10*3/uL (ref 0.0–0.5)
Eosinophils Relative: 2 %
HCT: 40.5 % (ref 36.0–46.0)
Hemoglobin: 13.1 g/dL (ref 12.0–15.0)
Immature Granulocytes: 0 %
Lymphocytes Relative: 20 %
Lymphs Abs: 1.3 10*3/uL (ref 0.7–4.0)
MCH: 29.5 pg (ref 26.0–34.0)
MCHC: 32.3 g/dL (ref 30.0–36.0)
MCV: 91.2 fL (ref 80.0–100.0)
Monocytes Absolute: 0.5 10*3/uL (ref 0.1–1.0)
Monocytes Relative: 8 %
Neutro Abs: 4.4 10*3/uL (ref 1.7–7.7)
Neutrophils Relative %: 69 %
Platelets: 297 10*3/uL (ref 150–400)
RBC: 4.44 MIL/uL (ref 3.87–5.11)
RDW: 12.6 % (ref 11.5–15.5)
WBC: 6.3 10*3/uL (ref 4.0–10.5)
nRBC: 0 % (ref 0.0–0.2)

## 2022-04-07 LAB — URINALYSIS, ROUTINE W REFLEX MICROSCOPIC
Bilirubin Urine: NEGATIVE
Bilirubin Urine: NEGATIVE
Glucose, UA: NEGATIVE mg/dL
Glucose, UA: NEGATIVE mg/dL
Hgb urine dipstick: NEGATIVE
Hgb urine dipstick: NEGATIVE
Ketones, ur: 5 mg/dL — AB
Ketones, ur: NEGATIVE mg/dL
Leukocytes,Ua: NEGATIVE
Nitrite: NEGATIVE
Nitrite: NEGATIVE
Protein, ur: NEGATIVE mg/dL
Protein, ur: NEGATIVE mg/dL
Specific Gravity, Urine: 1.008 (ref 1.005–1.030)
Specific Gravity, Urine: 1.024 (ref 1.005–1.030)
pH: 5 (ref 5.0–8.0)
pH: 7 (ref 5.0–8.0)

## 2022-04-07 LAB — LIPASE, BLOOD
Lipase: 25 U/L (ref 11–51)
Lipase: 27 U/L (ref 11–51)

## 2022-04-07 MED ORDER — METHOCARBAMOL 500 MG PO TABS: 500.0000 mg | ORAL_TABLET | Freq: Three times a day (TID) | ORAL | 0 refills | Status: DC | PRN

## 2022-04-07 MED ORDER — ONDANSETRON HCL 4 MG PO TABS: 4.0000 mg | ORAL_TABLET | Freq: Four times a day (QID) | ORAL | 0 refills | Status: DC

## 2022-04-07 MED ORDER — ONDANSETRON 4 MG PO TBDP
4.0000 mg | ORAL_TABLET | Freq: Once | ORAL | Status: AC
  Administered 2022-04-07: 4 mg via ORAL
  Filled 2022-04-07: qty 1

## 2022-04-07 MED ORDER — METHOCARBAMOL 500 MG PO TABS: 500.0000 mg | ORAL_TABLET | Freq: Two times a day (BID) | ORAL | 0 refills | Status: DC | PRN

## 2022-04-07 MED ORDER — PANTOPRAZOLE SODIUM 20 MG PO TBEC: 20.0000 mg | DELAYED_RELEASE_TABLET | Freq: Every day | ORAL | 0 refills | Status: DC

## 2022-04-07 MED ORDER — POTASSIUM CHLORIDE 20 MEQ PO PACK
20.0000 meq | PACK | Freq: Once | ORAL | Status: AC
  Administered 2022-04-07: 20 meq via ORAL
  Filled 2022-04-07: qty 1

## 2022-04-07 MED ORDER — HYDROMORPHONE HCL 1 MG/ML IJ SOLN
1.0000 mg | Freq: Once | INTRAMUSCULAR | Status: AC
  Administered 2022-04-07: 1 mg via INTRAVENOUS
  Filled 2022-04-07: qty 1

## 2022-04-07 NOTE — Discharge Instructions (Addendum)
Continue to take your home pain medication.  An outpatient ultrasound has been ordered for you.  Call the scheduling number tomorrow morning to get an appointment time to come back here and have this test completed.  In the interim I have prescribed you some nausea medicine along with an acid/peptic ulcer medication to help rule out this possibility as a source of your symptoms.  You will be advised further tomorrow after your ultrasound result.

## 2022-04-07 NOTE — ED Notes (Signed)
Patient states the wait is toolong and she isleaving. [facility]

## 2022-04-07 NOTE — ED Provider Notes (Cosign Needed Addendum)
Signout at shift change from [employer], patient with a 3-day history of mid back pain and right sided abdominal pain, CT imaging and labs have been unremarkable, currently pending urinalysis results.   8:00 PM UA result is negative for UTI.  Symptoms suggesting musculoskeletal source of symptoms.  She is in chronic pain management and is on MS Contin routinely.  Will add muscle relaxer to this regimen.  Also discussed heat and ice modalities.  Upon re-examination, pt became tearful,  stating she has never had such severe pain in her abd,  associated with n/v, denies h/o gerd or reflux,  has stayed persistently nauseated since sx began.  Question whether biliary colic could explain sx.  No gallstones seen on renal stone CT study.  She will be given zofran for nausea, also advised PPI, prescribed.  RUQ [place] ordered outpt tomorrow, further tx pending this result.   She states she has tizanadine, will cancel robaxin order.    Burgess Amor, PA-C 04/07/22 2001    Burgess Amor, PA-C 04/07/22 2050    Mancel Bale, MD 04/08/22 [id number]

## 2022-04-07 NOTE — ED Notes (Signed)
Pt states the wait is too long and she is leaving 

## 2022-04-07 NOTE — ED Triage Notes (Signed)
Pt with mid back pains x 3 days and radiates to abd pain.  +N/V, denies diarrhea

## 2022-04-07 NOTE — ED Provider Notes (Signed)
[facility] EMERGENCY DEPARTMENT Provider Note   CSN: [id number] Arrival date & time: 04/07/22  1656     History  Chief Complaint  Patient presents with   Back Pain    Cindy Pratt is a 56 y.o. female with past medical history significant for fibromyalgia, depression, asthma, anxiety, hyperlipidemia, lumbosacral neuritis, sciatic history of kidney stones, chronic pain syndrome, bipolar disorder who presents with concern for mid back pain for the last 3 days, began having central to right-sided abdominal pain today.  She reports that she is started having some nausea and vomiting today and yesterday.  She denies any diarrhea, fever but does endorse some chills.  She denies any chest pain, shortness of breath.  Patient denies dysuria, hematuria.  Patient is a chronic pain patient and has been taking her morphine and tramadol at home.   Back Pain      Home Medications Prior to Admission medications   Medication Sig Start Date End Date Taking? Authorizing Provider  albuterol (VENTOLIN HFA) 108 (90 Base) MCG/ACT inhaler Inhale 2 puffs into the lungs every 6 (six) hours as needed for wheezing or shortness of breath. 07/04/19   [provider]  ALPHA-LIPOIC ACID PO Take by mouth.    [provider]  azelastine (ASTELIN) 0.1 % nasal spray Place 2 sprays into both nostrils daily. Use in each nostril as directed    [provider]  Boswellia Serrata (BOSWELLIA PO) Take by mouth.    [provider]  buPROPion (WELLBUTRIN SR) 150 MG 12 hr tablet TAKE 1 TABLET BY MOUTH TWICE A DAY (NEEDAPPT FOR REFILLS) 03/05/22   Jomarie Longs, MD  COCONUT OIL PO Take by mouth.    [provider]  COLLAGEN PO Take by mouth.    [provider]  fluticasone (FLONASE) 50 MCG/ACT nasal spray Place 2 sprays into the nose daily. 09/20/18   [provider]  Ginger, Zingiber officinalis, (GINGER PO) Take by mouth.    [provider]  lamoTRIgine  (LAMICTAL) 100 MG tablet TAKE 1/2 A TABLET BY MOUTH 2 TIMES DAILY 03/05/22   Jomarie Longs, MD  lamoTRIgine (LAMICTAL) 25 MG tablet TAKE 1 TABLET BY MOUTH ONCE A DAY. TAKE WITH 100 MG FOR TOTAL DOSE OF 125 MG. 03/05/22   Eappen, Levin Bacon, MD  levocetirizine (XYZAL) 5 MG tablet Take 5 mg by mouth at bedtime. 09/20/18   [provider]  metoprolol tartrate (LOPRESSOR) 100 MG tablet Take 1 tablet (100 mg total) by mouth once for 1 dose. Take TWO hours prior to CT procedure Patient not taking: Reported on 12/14/2021 06/17/21 06/17/21  End, Cristal Deer, MD  morphine (MS CONTIN) 30 MG 12 hr tablet Take 1 tablet (30 mg total) by mouth every 8 (eight) hours. 04/06/22   Jones Bales, NP  ondansetron (ZOFRAN) 4 MG tablet Take 4 mg by mouth every 8 (eight) hours as needed for nausea or vomiting.    [provider]  risperiDONE (RISPERDAL) 0.25 MG tablet TAKE 1 TABLET BY MOUTH EVERY NIGHT AT BEDTIME 03/12/22   Jomarie Longs, MD  SELENIUM PO Take by mouth.    [provider]  tiZANidine (ZANAFLEX) 4 MG tablet Take 1 tablet (4 mg total) by mouth 2 (two) times daily. 11/16/21   Jones Bales, NP  topiramate (TOPAMAX) 50 MG tablet Take 1 tablet (50 mg total) by mouth 2 (two) times daily. 03/11/22   Jones Bales, NP  traMADol (ULTRAM) 50 MG tablet Take 1 tablet (50  mg total) by mouth 3 (three) times daily as needed. Do Not Fill Before 08/14/ 2023 04/06/22   Jones Bales, NP  Turmeric (QC TUMERIC COMPLEX PO) Take by mouth.    [provider]      Allergies    Aspirin, Nsaids, Sulfa antibiotics, Sulfonamide derivatives, Tolmetin, Cymbalta [duloxetine hcl], Duloxetine, Gabapentin, Other, and Pregabalin    Review of Systems   Review of Systems  Musculoskeletal:  Positive for back pain.  All other systems reviewed and are negative.   Physical Exam Updated Vital Signs BP 113/80 (BP Location: Left Arm)   Pulse 78   Temp 98.3 F (36.8 C) (Oral)   Resp 18   Ht 5' 4.5"  (1.638 m)   Wt 62.8 kg   SpO2 97%   BMI 23.41 kg/m  Physical Exam Vitals and nursing note reviewed.  Constitutional:      General: She is not in acute distress.    Appearance: Normal appearance.  HENT:     Head: Normocephalic and atraumatic.  Eyes:     General:        Right eye: No discharge.        Left eye: No discharge.  Cardiovascular:     Rate and Rhythm: Normal rate and regular rhythm.     Heart sounds: No murmur heard.    No friction rub. No gallop.  Pulmonary:     Effort: Pulmonary effort is normal.     Breath sounds: Normal breath sounds.  Abdominal:     General: Bowel sounds are normal.     Palpations: Abdomen is soft.     Comments: Tender to palpation epigastric and right upper quadrant, no rebound, rigidity, guarding.  Normal bowel sounds throughout.  Musculoskeletal:     Comments: Tenderness palpation in the right lumbar paraspinous region, no midline spinal tenderness in the lumbar, thoracic or cervical regions.  Intact strength 5 out of 5 bilateral upper and lower extremities.  Skin:    General: Skin is warm and dry.     Capillary Refill: Capillary refill takes less than 2 seconds.  Neurological:     Mental Status: She is alert and oriented to person, place, and time.  Psychiatric:        Mood and Affect: Mood normal.        Behavior: Behavior normal.     ED Results / Procedures / Treatments   Labs (all labs ordered are listed, but only abnormal results are displayed) Labs Reviewed  COMPREHENSIVE METABOLIC PANEL - Abnormal; Notable for the following components:      Result Value   Potassium 3.3 (*)    Glucose, Bld 101 (*)    All other components within normal limits  CBC - Abnormal; Notable for the following components:   WBC 10.9 (*)    All other components within normal limits  LIPASE, BLOOD  URINALYSIS, ROUTINE W REFLEX MICROSCOPIC    EKG None  Radiology CT Renal Stone Study  Result Date: 04/07/2022 CLINICAL DATA:  Right-sided flank pain  with nausea and vomiting for 3 days. Kidney stone suspected. EXAM: CT ABDOMEN AND PELVIS WITHOUT CONTRAST TECHNIQUE: Multidetector CT imaging of the abdomen and pelvis was performed following the standard protocol without IV contrast. RADIATION DOSE REDUCTION: This exam was performed according to the departmental dose-optimization program which includes automated exposure control, adjustment of the mA and/or kV according to patient size and/or use of iterative reconstruction technique. COMPARISON:  Abdominopelvic CT 04/10/2018. FINDINGS: Lower chest: Clear lung  bases. No significant pleural or pericardial effusion. Hepatobiliary: The liver appears unremarkable as imaged in the noncontrast state. No evidence of gallstones, gallbladder wall thickening or biliary dilatation. Pancreas: Unremarkable. No pancreatic ductal dilatation or surrounding inflammatory changes. Spleen: Normal in size without focal abnormality. Adrenals/Urinary Tract: Both adrenal glands appear normal. The kidneys appear normal without evidence of urinary tract calculus, suspicious lesion or hydronephrosis. The bladder is nearly empty and suboptimally evaluated. No evidence of bladder calculus. Stomach/Bowel: No enteric contrast administered. The stomach appears unremarkable for its degree of distension. No evidence of bowel wall thickening, distention or surrounding inflammatory change. No appendiceal abnormality identified. Moderate stool throughout the colon. Vascular/Lymphatic: There are no enlarged abdominal or pelvic lymph nodes. Mild aortic atherosclerosis. Reproductive: Hysterectomy. No adnexal mass. Probable pelvic floor laxity. Other: Small periumbilical hernia containing only fat. Musculoskeletal: No acute or significant osseous findings. Previous L5-S1 PLIF with intact hardware. Minimally progressive degenerative anterolisthesis at L4-5. IMPRESSION: 1. No acute findings or explanation for the patient's symptoms. 2. No evidence of  urinary tract calculus or hydronephrosis. 3. Moderate stool throughout the colon. 4. Postsurgical changes as described. Electronically Signed   By: Carey Bullocks M.D.   On: 04/07/2022 19:03    Procedures Procedures    Medications Ordered in ED Medications  potassium chloride (KLOR-CON) packet 20 mEq (has no administration in time range)  ondansetron (ZOFRAN-ODT) disintegrating tablet 4 mg (4 mg Oral Given 04/07/22 1735)  HYDROmorphone (DILAUDID) injection 1 mg (1 mg Intravenous Given 04/07/22 1757)    ED Course/ Medical Decision Making/ A&P                           Medical Decision Making Amount and/or Complexity of Data Reviewed Labs: ordered. Radiology: ordered.  Risk Prescription drug management.   This patient is a 56 y.o. female who presents to the ED for concern of back pain radiating into the abdomen, this involves an extensive number of treatment options, and is a complaint that carries with it a high risk of complications and morbidity. The emergent differential diagnosis prior to evaluation includes, but is not limited to, UTI, pyelonephritis, nephrolithiasis, gastroenteritis, cholecystitis, cholangitis, diverticulitis, appendicitis, acute mesenteric ischemia, simple musculoskeletal back pain, new compression fracture, or other unstable back injury versus other.   This is not an exhaustive differential.   Past Medical History / Co-morbidities / Social History: Patient with considerable chronic pain, she takes high-dose morphine and tramadol at home, she has a history of fibromyalgia, depression, asthma, anxiety, hyperlipidemia, lumbosacral neuritis, sciatic history of kidney stones, bipolar disorder additionally  Additional history: Chart reviewed. Pertinent results include: Reviewed outpatient PCP, behavioral health, pain clinic, and cardiology visits  Physical Exam: Physical exam performed. The pertinent findings include: Patient with some tenderness to palpation  paraspinous muscles of the right lumbar spine, as well as tenderness in the right upper quadrant and epigastric region of the abdomen  Lab Tests: I ordered, and personally interpreted labs.  The pertinent results include: Pending at time of handoff, so far CBC shows mild leukocytosis with white blood cells 10.9.  CMP with mild hypokalemia, potassium 3.3.  Lipase unremarkable.   Imaging Studies: I ordered imaging studies including CT stone study. I independently visualized and interpreted imaging which showed moderate stool burden, but no kidney stone or other identifiable cause for patient's abdominal pain. I agree with the radiologist interpretation.  Medications: I ordered medication including Dilaudid for pain, Zofran for nausea. Reevaluation of the patient  after these medicines showed that the patient improved. I have reviewed the patients home medicines and have made adjustments as needed.  7:16 PM Care of MARZELL ALLEMAND transferred to PA Raynelle Fanning Idol and Dr. Effie Shy at the end of my shift as the patient will require reassessment once labs/imaging have resulted. Patient presentation, ED course, and plan of care discussed with review of all pertinent labs and imaging. Please see his/her note for further details regarding further ED course and disposition. Plan at time of handoff is disposition pending urinalysis, and reevaluation of patient's pain. This may be altered or completely changed at the discretion of the oncoming team pending results of further workup.  I discussed this case with my attending physician Dr. Effie Shy who cosigned this note including patient's presenting symptoms, physical exam, and planned diagnostics and interventions. Attending physician stated agreement with plan or made changes to plan which were implemented.    Final Clinical Impression(s) / ED Diagnoses Final diagnoses:  Acute right-sided low back pain without sciatica  Epigastric pain    Rx / DC Orders ED Discharge  Orders     None         Olene Floss, PA-C 04/07/22 1916    Mancel Bale, MD 04/08/22 [phone]

## 2022-04-07 NOTE — ED Notes (Signed)
Patient transported to CT 

## 2022-04-07 NOTE — ED Notes (Signed)
ED Provider at bedside. 

## 2022-04-07 NOTE — ED Provider Triage Note (Cosign Needed Addendum)
Emergency Medicine Provider Triage Evaluation Note  Cindy Pratt , a 56 y.o. female  was evaluated in triage.  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 complains of abdominal pain and back pain. States that back pain started 3 days ago and began to radiate around her abdomen to epigastric region. States that pain was much worse today prompting her to come in to Ed for eval. Denies trauma. Endorses nausea, but denies fever, vomiting and diarrhea. Denies hematuria, bloody stools, and denies hematemesis. Also seen regularly at pain clinic. Takes PO morphine TID for pain.   Review of Systems  Positive: As above Negative: As above  Physical Exam  BP 116/80 (BP Location: Left Arm)   Pulse 61   Temp 98.7 F (37.1 C) (Oral)   Resp 20   SpO2 99%  Gen:   Awake, distress Resp:  Normal effort  MSK:   Moves extremities without difficulty  Other:    Medical Decision Making  Medically screening exam initiated at 12:17 AM.  Appropriate orders placed.  Cindy Pratt was informed that the remainder of the evaluation will be completed by another provider, this initial triage assessment does not replace that evaluation, and the importance of remaining in the ED until their evaluation is complete.  Ordered CMP and lipase.      Gareth Eagle, PA-C 04/07/22 0030    Gareth Eagle, PA-C 04/07/22 [phone]

## 2022-04-08 ENCOUNTER — Ambulatory Visit (HOSPITAL_COMMUNITY)
Admission: RE | Admit: 2022-04-08 | Discharge: 2022-04-08 | Disposition: A | Discharge status: Home / Self Care | Payer: Medicare Other | Source: Ambulatory Visit | Attending: Emergency Medicine | Admitting: Emergency Medicine

## 2022-04-08 DIAGNOSIS — R1013 Epigastric pain: Secondary | ICD-10-CM | POA: Diagnosis not present

## 2022-04-08 NOTE — ED Provider Notes (Signed)
Patient returns today for outpatient right upper quadrant ultrasound after yesterday's visit to the emergency room for abdominal pain.  Workup yesterday was reassuring with a CT scan.  Right upper quadrant ultrasound today without cholecystitis or other acute findings.  Discussed these findings with patient.  Patient on exam was comfortable.  Reports compliance with home pain regimen.  Has not picked up PPI yet and plans to do so today.  States plan was if this was okay she would give referral to gastroenterologist.  Information for Dr. Jena Gauss given.   Marita Kansas, PA-C 04/08/22 1043    Mardene Sayer, MD 04/08/22 [phone]

## 2022-04-12 ENCOUNTER — Other Ambulatory Visit (HOSPITAL_COMMUNITY): Payer: Self-pay | Admitting: Psychiatry

## 2022-04-12 ENCOUNTER — Other Ambulatory Visit: Payer: Self-pay | Admitting: Psychiatry

## 2022-04-12 DIAGNOSIS — E221 Hyperprolactinemia: Secondary | ICD-10-CM

## 2022-04-12 DIAGNOSIS — F3176 Bipolar disorder, in full remission, most recent episode depressed: Secondary | ICD-10-CM

## 2022-04-12 DIAGNOSIS — F4310 Post-traumatic stress disorder, unspecified: Secondary | ICD-10-CM

## 2022-04-16 ENCOUNTER — Other Ambulatory Visit: Payer: Self-pay | Admitting: Psychiatry

## 2022-04-16 DIAGNOSIS — E221 Hyperprolactinemia: Secondary | ICD-10-CM

## 2022-04-16 DIAGNOSIS — F3176 Bipolar disorder, in full remission, most recent episode depressed: Secondary | ICD-10-CM

## 2022-04-20 ENCOUNTER — Other Ambulatory Visit: Payer: Self-pay | Admitting: Psychiatry

## 2022-04-20 DIAGNOSIS — F4310 Post-traumatic stress disorder, unspecified: Secondary | ICD-10-CM

## 2022-04-30 ENCOUNTER — Encounter: Payer: Self-pay | Admitting: Nurse Practitioner

## 2022-05-10 ENCOUNTER — Ambulatory Visit
Admission: RE | Admit: 2022-05-10 | Discharge: 2022-05-10 | Disposition: A | Discharge status: Home / Self Care | Payer: Medicare Other | Source: Ambulatory Visit | Attending: Registered Nurse | Admitting: Registered Nurse

## 2022-05-10 ENCOUNTER — Other Ambulatory Visit: Payer: Self-pay | Admitting: Psychiatry

## 2022-05-10 ENCOUNTER — Encounter: Payer: Self-pay | Admitting: Registered Nurse

## 2022-05-10 ENCOUNTER — Encounter: Payer: Medicare Other | Attending: Registered Nurse | Admitting: Registered Nurse

## 2022-05-10 VITALS — BP 105/70 | HR 70 | Ht 64.5 in | Wt 139.2 lb

## 2022-05-10 DIAGNOSIS — M792 Neuralgia and neuritis, unspecified: Secondary | ICD-10-CM | POA: Diagnosis present

## 2022-05-10 DIAGNOSIS — Z5181 Encounter for therapeutic drug level monitoring: Secondary | ICD-10-CM

## 2022-05-10 DIAGNOSIS — M7061 Trochanteric bursitis, right hip: Secondary | ICD-10-CM

## 2022-05-10 DIAGNOSIS — M961 Postlaminectomy syndrome, not elsewhere classified: Secondary | ICD-10-CM

## 2022-05-10 DIAGNOSIS — M542 Cervicalgia: Secondary | ICD-10-CM

## 2022-05-10 DIAGNOSIS — M797 Fibromyalgia: Secondary | ICD-10-CM

## 2022-05-10 DIAGNOSIS — G5793 Unspecified mononeuropathy of bilateral lower limbs: Secondary | ICD-10-CM

## 2022-05-10 DIAGNOSIS — M5412 Radiculopathy, cervical region: Secondary | ICD-10-CM

## 2022-05-10 DIAGNOSIS — G894 Chronic pain syndrome: Secondary | ICD-10-CM | POA: Diagnosis present

## 2022-05-10 DIAGNOSIS — M7062 Trochanteric bursitis, left hip: Secondary | ICD-10-CM | POA: Diagnosis present

## 2022-05-10 DIAGNOSIS — F4310 Post-traumatic stress disorder, unspecified: Secondary | ICD-10-CM

## 2022-05-10 DIAGNOSIS — F3176 Bipolar disorder, in full remission, most recent episode depressed: Secondary | ICD-10-CM

## 2022-05-10 DIAGNOSIS — M545 Low back pain, unspecified: Secondary | ICD-10-CM | POA: Diagnosis present

## 2022-05-10 DIAGNOSIS — Z79891 Long term (current) use of opiate analgesic: Secondary | ICD-10-CM | POA: Diagnosis present

## 2022-05-10 DIAGNOSIS — G8929 Other chronic pain: Secondary | ICD-10-CM | POA: Diagnosis present

## 2022-05-10 DIAGNOSIS — E221 Hyperprolactinemia: Secondary | ICD-10-CM

## 2022-05-10 MED ORDER — TIZANIDINE HCL 4 MG PO TABS: 4.0000 mg | ORAL_TABLET | Freq: Two times a day (BID) | ORAL | 5 refills | Status: DC

## 2022-05-10 MED ORDER — TRAMADOL HCL 50 MG PO TABS: 50.0000 mg | ORAL_TABLET | Freq: Three times a day (TID) | ORAL | 2 refills | Status: DC | PRN

## 2022-05-10 MED ORDER — TOPIRAMATE 50 MG PO TABS: 50.0000 mg | ORAL_TABLET | Freq: Two times a day (BID) | ORAL | 3 refills | Status: DC

## 2022-05-10 MED ORDER — MORPHINE SULFATE ER 30 MG PO TBCR: 30.0000 mg | EXTENDED_RELEASE_TABLET | Freq: Three times a day (TID) | ORAL | 0 refills | Status: DC
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
## 2022-05-11 ENCOUNTER — Other Ambulatory Visit: Payer: Self-pay | Admitting: Psychiatry

## 2022-05-11 DIAGNOSIS — E221 Hyperprolactinemia: Secondary | ICD-10-CM

## 2022-05-11 DIAGNOSIS — F3176 Bipolar disorder, in full remission, most recent episode depressed: Secondary | ICD-10-CM

## 2022-05-11 DIAGNOSIS — F4310 Post-traumatic stress disorder, unspecified: Secondary | ICD-10-CM

## 2022-05-17 ENCOUNTER — Telehealth (INDEPENDENT_AMBULATORY_CARE_PROVIDER_SITE_OTHER): Payer: Self-pay | Admitting: Psychiatry

## 2022-05-17 DIAGNOSIS — Z91199 Patient's noncompliance with other medical treatment and regimen due to unspecified reason: Secondary | ICD-10-CM

## 2022-05-17 NOTE — Progress Notes (Signed)
No response to call or text or video invite  

## 2022-05-18 ENCOUNTER — Telehealth: Payer: Self-pay | Admitting: Registered Nurse

## 2022-05-18 NOTE — Telephone Encounter (Signed)
Cervical X-Ray reviewed. She states she sees Ortho on Thursday, we will continue to monitor.

## 2022-05-18 NOTE — Progress Notes (Addendum)
Referring Physician:  No referring provider defined for this encounter.  Primary Physician:  Leonel Ramsay, MD  History of Present Illness: 05/18/2022 [name] has been seeing PMR for postlaminectomy syndrome, SI joint dysfunction, neck pain, FM, and polyneuropathy. They have her on MS Contin and tramadol.   History of fusion L5-S1.   Seen in ED on 04/07/22 for back pain that radiated into her abdomen. GI workup was negative. She was to follow up with GI as an outpatient.   She is here for follow up.   She has constant LBP with intermittent bilateral anterior thigh pain x years. LBP > leg pain. Right leg pain = left left pain. No numbness or tingling. Occasional weakness in her legs. Pain is worse when she gets out of bed or tries to get up from prolonged sitting. Pain also worse with prolonged walking ([employer]).   Some improvement with pain medications - takes the edge off. Some relief with previous injections.    Conservative measures:  Physical therapy: none  Multimodal medical therapy including regular antiinflammatories: MS contin, ultram, zanaflex  Injections: Right: L5 Dorsa Ramus S1-S2-S3 lateral Branch Blocks with good relief noted.  Past Surgery: fusion L5-S1 with surgery in 1997 and 2003  Cindy Pratt has no symptoms of cervical myelopathy.  The symptoms are causing a significant impact on the patient's life.   Review of Systems:  A 10 point review of systems is negative, except for the pertinent positives and negatives detailed in the HPI.  Past Medical History: Past Medical History:  Diagnosis Date   Anxiety    Asthma    Belchings    Bipolar disorder ([place])    Chronic pain syndrome    in pain clinic   Degenerative joint disease    Depression    Disturbance of skin sensation    Fibromyalgia    GERD (gastroesophageal reflux disease)    History of kidney stones    Hyperlipidemia    Lumbar post-laminectomy syndrome    Lumbosacral neuritis     Osteoarthritis    Other chronic postoperative pain    Sciatica     Past Surgical History: Past Surgical History:  Procedure Laterality Date   ABDOMINAL HYSTERECTOMY     BACK SURGERY  1997, 2003   CYSTECTOMY     DIAGNOSTIC LAPAROSCOPY     EXTRACORPOREAL SHOCK WAVE LITHOTRIPSY Right 05/04/2018   Procedure: RIGHT EXTRACORPOREAL SHOCK WAVE LITHOTRIPSY (ESWL) WITH MAC;  Surgeon: Kathie Rhodes, MD;  Location: [facility];  Service: Urology;  Laterality: Right;   IR ANGIO INTRA EXTRACRAN SEL COM CAROTID INNOMINATE BILAT MOD SED  06/09/2020   IR ANGIO VERTEBRAL SEL VERTEBRAL BILAT MOD SED  06/09/2020   IR RADIOLOGIST EVAL & MGMT  08/10/2020   IR US GUIDE VASC ACCESS RIGHT  06/09/2020   WRIST ARTHROPLASTY Left    WRIST ARTHROSCOPY WITH DEBRIDEMENT Right 11/13/2015   Procedure: RIGHT WRIST ARTHROSCOPY WITH DEBRIDEMENT;  Surgeon: Leanora Cover, MD;  Location: [facility];  Service: Orthopedics;  Laterality: Right;    Allergies: Allergies as of 05/20/2022 - Review Complete 05/10/2022  Allergen Reaction Noted   Aspirin Shortness Of Breath 07/22/2008   Nsaids Shortness Of Breath 07/22/2008   Sulfa antibiotics Hives 10/20/2015   Sulfonamide derivatives Hives 07/22/2008   Tolmetin Shortness Of Breath 10/20/2015   Cymbalta [duloxetine hcl] Other (See Comments) 09/14/2011   Duloxetine Other (See Comments) 10/20/2015   Gabapentin Other (See Comments) 07/22/2008   Other Other (See Comments)  05/31/2012   Pregabalin Other (See Comments) 10/22/2008    Medications: Outpatient Encounter Medications as of 05/20/2022  Medication Sig   albuterol (VENTOLIN HFA) 108 (90 Base) MCG/ACT inhaler Inhale 2 puffs into the lungs every 6 (six) hours as needed for wheezing or shortness of breath.   ALPHA-LIPOIC ACID PO Take by mouth.   azelastine (ASTELIN) 0.1 % nasal spray Place 2 sprays into both nostrils daily. Use in each nostril as directed   Boswellia Serrata (BOSWELLIA PO) Take by mouth.    buPROPion (WELLBUTRIN SR) 150 MG 12 hr tablet TAKE 1 TABLET BY MOUTH TWICE A DAY (NEEDAPPT FOR REFILLS)   COCONUT OIL PO Take by mouth.   COLLAGEN PO Take by mouth.   fluticasone (FLONASE) 50 MCG/ACT nasal spray Place 2 sprays into the nose daily.   Ginger, Zingiber officinalis, (GINGER PO) Take by mouth.   lamoTRIgine (LAMICTAL) 100 MG tablet TAKE 1/2 A TABLET BY MOUTH 2 TIMES DAILY(NEED APPT FOR REFILLS)   lamoTRIgine (LAMICTAL) 25 MG tablet TAKE 1 TABLET BY MOUTH ONCE A DAY. TAKE WITH 100 MG FOR TOTAL DOSE OF 125 MG. (NEEED APPT FOR REFILLS)   levocetirizine (XYZAL) 5 MG tablet Take 5 mg by mouth at bedtime.   metoprolol tartrate (LOPRESSOR) 100 MG tablet Take 1 tablet (100 mg total) by mouth once for 1 dose. Take TWO hours prior to CT procedure (Patient not taking: Reported on 12/14/2021)   morphine (MS CONTIN) 30 MG 12 hr tablet Take 1 tablet (30 mg total) by mouth every 8 (eight) hours.   ondansetron (ZOFRAN) 4 MG tablet Take 1 tablet (4 mg total) by mouth every 6 (six) hours.   pantoprazole (PROTONIX) 20 MG tablet Take 1 tablet (20 mg total) by mouth daily.   risperiDONE (RISPERDAL) 0.25 MG tablet TAKE 1 TABLET BY MOUTH EVERY NIGHT AT BEDTIME (NEED APPT FOR REFILLS)   SELENIUM PO Take by mouth.   tiZANidine (ZANAFLEX) 4 MG tablet Take 1 tablet (4 mg total) by mouth 2 (two) times daily.   topiramate (TOPAMAX) 50 MG tablet Take 1 tablet (50 mg total) by mouth 2 (two) times daily.   traMADol (ULTRAM) 50 MG tablet Take 1 tablet (50 mg total) by mouth 3 (three) times daily as needed. Do Not Fill Before 09/11/ 2023   Turmeric (QC TUMERIC COMPLEX PO) Take by mouth.   No facility-administered encounter medications on file as of 05/20/2022.    Social History: Social History   Tobacco Use   Smoking status: Former    Packs/day: 1.00    Years: 20.00    Total pack years: 20.00    Types: Cigarettes    Quit date: 2006    Years since quitting: 17.7   Smokeless tobacco: Never  Vaping Use    Vaping Use: Never used  Substance Use Topics   Alcohol use: No   Drug use: No    Family Medical History: Family History  Problem Relation Age of Onset   Hypertension Mother    Lung disease Mother    Hypertension Father    Heart attack Father 70   Drug abuse Father    Alcohol abuse Father    Breast cancer Maternal Grandmother    Liver cancer Maternal Grandmother    Cancer Maternal Grandmother        breast cancer   Diabetes Maternal Grandfather        both sides   Alcohol abuse Paternal Grandfather    Drug abuse Paternal Merchant navy officer  Ovarian cancer Cousin    Cancer Cousin        1st c. maternal side/ cervical cancer   Cancer Cousin        1st c maternal/ colon cancer    Physical Examination: There were no vitals filed for this visit.  General: Patient is well developed, well nourished, calm, collected, and in no apparent distress. Attention to examination is appropriate.  Respiratory: Patient is breathing without any difficulty.   NEUROLOGICAL:     Awake, alert, oriented to person, place, and time.  Speech is clear and fluent. Fund of knowledge is appropriate.   Cranial Nerves: Pupils equal round and reactive to light.  Facial tone is symmetric.  Facial sensation is symmetric.  ROM of spine:  Limited ROM of lumbar spine with pain  No abnormal lesions on exposed skin.   Strength: Side Biceps Triceps Deltoid Interossei Grip Wrist Ext. Wrist Flex.  R 5- 5- 5- 5- 5- 5- 5-  L 5- 5- 5- 5- 5- 5- 5-   Side Iliopsoas Quads Hamstring PF DF EHL  R 5- 5- 5- 5- 5- 5-  L 5- 5- 5- 5- 5- 5-   Reflexes are 2+ and symmetric at the biceps, triceps, brachioradialis, patella and achilles.   Hoffman's is absent.  Clonus is not present.   Bilateral upper and lower extremity sensation is intact to light touch.     She has point tenderness over right SI joint.   Gait is normal.     Medical Decision Making  Imaging: CT Renal Stone Study 04/07/22:  FINDINGS: Lower chest:  Clear lung bases. No significant pleural or pericardial effusion.   Hepatobiliary: The liver appears unremarkable as imaged in the noncontrast state. No evidence of gallstones, gallbladder wall thickening or biliary dilatation.   Pancreas: Unremarkable. No pancreatic ductal dilatation or surrounding inflammatory changes.   Spleen: Normal in size without focal abnormality.   Adrenals/Urinary Tract: Both adrenal glands appear normal. The kidneys appear normal without evidence of urinary tract calculus, suspicious lesion or hydronephrosis. The bladder is nearly empty and suboptimally evaluated. No evidence of bladder calculus.   Stomach/Bowel: No enteric contrast administered. The stomach appears unremarkable for its degree of distension. No evidence of bowel wall thickening, distention or surrounding inflammatory change. No appendiceal abnormality identified. Moderate stool throughout the colon.   Vascular/Lymphatic: There are no enlarged abdominal or pelvic lymph nodes. Mild aortic atherosclerosis.   Reproductive: Hysterectomy. No adnexal mass. Probable pelvic floor laxity.   Other: Small periumbilical hernia containing only fat.   Musculoskeletal: No acute or significant osseous findings. Previous L5-S1 PLIF with intact hardware. Minimally progressive degenerative anterolisthesis at L4-5.   IMPRESSION: 1. No acute findings or explanation for the patient's symptoms. 2. No evidence of urinary tract calculus or hydronephrosis. 3. Moderate stool throughout the colon. 4. Postsurgical changes as described.     Electronically Signed   By: Richardean Sale M.D.   On: 04/07/2022 19:03   I have personally reviewed the images and agree with the above interpretation.  Assessment and Plan: Ms. Hollett is a pleasant 56 y.o. female with constant LBP with intermittent bilateral anterior thigh pain x years. Pain is worse when she gets out of bed or tries to get up from prolonged  sitting. Pain also worse with prolonged walking ([employer]).   She has point tenderness over her right SI joint and I suspect this is her primary pain generator. CT above shows fusion at L5-S1 with slip at L4-L5.  Above treatment options discussed with patient and following plan made:   - Lumbar xrays on her way out. Will call with results.  - Discussed PT for lumbar spine. She wants to hold off.  - Continue current medications from PMR including MS Contin, ultram, and zanaflex.  - Depending on results of xrays, may consider referral back to her PMR clinic for repeat right sided injection (see above).   I spent a total of 30 minutes in face-to-face and non-face-to-face activities related to this patient's care today.  Thank you for involving me in the care of this patient.   ADDENDUM 05/24/22:  Lumbar xrays dated 05/22/22:  FINDINGS: Unchanged appearance of the lumbar spine status post posterior fusion of L5-S1. No evidence of perihardware fracture or component loosening. Unchanged, minimal anterolisthesis of L5 on S1. Minimal multilevel disc space height loss and osteophytosis throughout the remainder of the lumbar spine. No dynamic listhesis on flexion and extension views. Nonobstructive pattern of overlying bowel gas. Large burden of stool in the colon.   IMPRESSION: 1. Unchanged appearance of the lumbar spine status post posterior fusion of L5-S1. No evidence of perihardware fracture or component loosening. 2. Unchanged, minimal anterolisthesis of L5 on S1. No dynamic listhesis on flexion and extension views. 3. Otherwise minimal multilevel disc space height loss and osteophytosis throughout the remainder of the lumbar spine. Lumbar disc and neural foraminal pathology may be further evaluated by MRI if indicated by neurologically localizing signs and symptoms.     Electronically Signed   By: Delanna Ahmadi M.D.   On: 05/22/2022 14:10  On my review, she has slip at L4-L5 that  is stable.     I have personally reviewed the images and agree with the above interpretation.  I recommend repeat injection with PMR and/or PT. Will set up phone visit to discuss further with her.   Geronimo Boot PA-C Dept. of Neurosurgery

## 2022-05-20 ENCOUNTER — Encounter: Payer: Self-pay | Admitting: Orthopedic Surgery

## 2022-05-20 ENCOUNTER — Ambulatory Visit (INDEPENDENT_AMBULATORY_CARE_PROVIDER_SITE_OTHER): Payer: Medicare Other | Admitting: Orthopedic Surgery

## 2022-05-20 ENCOUNTER — Ambulatory Visit
Admission: RE | Admit: 2022-05-20 | Discharge: 2022-05-20 | Disposition: A | Discharge status: Home / Self Care | Payer: Medicare Other | Source: Ambulatory Visit | Attending: Orthopedic Surgery | Admitting: Orthopedic Surgery

## 2022-05-20 ENCOUNTER — Ambulatory Visit
Admission: RE | Admit: 2022-05-20 | Discharge: 2022-05-20 | Disposition: A | Discharge status: Home / Self Care | Payer: Medicare Other | Attending: Orthopedic Surgery | Admitting: Orthopedic Surgery

## 2022-05-20 VITALS — BP 117/79 | HR 56 | Ht 64.0 in | Wt 136.4 lb

## 2022-05-20 DIAGNOSIS — M5416 Radiculopathy, lumbar region: Secondary | ICD-10-CM

## 2022-05-20 DIAGNOSIS — M5442 Lumbago with sciatica, left side: Secondary | ICD-10-CM

## 2022-05-20 DIAGNOSIS — M5441 Lumbago with sciatica, right side: Secondary | ICD-10-CM | POA: Insufficient documentation

## 2022-05-20 DIAGNOSIS — G8929 Other chronic pain: Secondary | ICD-10-CM

## 2022-05-20 DIAGNOSIS — Z981 Arthrodesis status: Secondary | ICD-10-CM | POA: Diagnosis present

## 2022-05-26 NOTE — Progress Notes (Unsigned)
   Telephone Visit- Progress Note: Referring Physician:  Leonel Ramsay, MD [place],  [place] [address]  Primary Physician:  Leonel Ramsay, MD  This visit was performed via telephone.  Patient location: home Provider location: office  I spent a total of 10 minutes non-face-to-face activities for this visit on the date of this encounter including review of current clinical condition and response to treatment.   Chief Complaint:  constant LBP with intermittent bilateral anterior thigh pain x years.  History of Present Illness: Cindy Pratt is a 56 y.o. female was last seen by me on 05/20/22 for above complaint.   She has been seeing PMR for postlaminectomy syndrome, SI joint dysfunction, neck pain, FM, and polyneuropathy. They have her on MS Contin and tramadol.    History of fusion L5-S1.   Phone visit to review her lumbar xrays.   Exam: No exam done as this was a telephone encounter.     Imaging: Lumbar xrays dated 05/22/22:  FINDINGS: Unchanged appearance of the lumbar spine status post posterior fusion of L5-S1. No evidence of perihardware fracture or component loosening. Unchanged, minimal anterolisthesis of L5 on S1. Minimal multilevel disc space height loss and osteophytosis throughout the remainder of the lumbar spine. No dynamic listhesis on flexion and extension views. Nonobstructive pattern of overlying bowel gas. Large burden of stool in the colon.   IMPRESSION: 1. Unchanged appearance of the lumbar spine status post posterior fusion of L5-S1. No evidence of perihardware fracture or component loosening. 2. Unchanged, minimal anterolisthesis of L5 on S1. No dynamic listhesis on flexion and extension views. 3. Otherwise minimal multilevel disc space height loss and osteophytosis throughout the remainder of the lumbar spine. Lumbar disc and neural foraminal pathology may be further evaluated by MRI if indicated by neurologically  localizing signs and symptoms.     Electronically Signed   By: Delanna Ahmadi M.D.   On: 05/22/2022 14:10   On my review, she has slip at L4-L5 that is stable.     I have personally reviewed the images and agree with the above interpretation.    Assessment and Plan: Cindy Pratt is a pleasant 56 y.o. female with history of fusion L5-S1.   She has constant LBP with intermittent bilateral anterior thigh pain x years. Pain is worse when she gets out of bed or tries to get up from prolonged sitting. Pain also worse with prolonged walking ([employer]).   Xrays show fusion looks good at L5=-S1. She has slip at L4-L5 with DDD. I suspect some of her pain is from right SI joint, but some pain likely from L4-L5 as well.   Above treatment options discussed with patient and following plan made  - We discussed repeat injection with PMR and/or PT. She declines both of these things.  - As she is having radicular pain into her thighs, I think it is reasonable to get an MRI of her lumbar spine. She would like this done at [facility] current medications from PMR including MS Contin, ultram, and zanaflex.  - Depending on results of MRI, may consider lumbar injections.  - Set up phone call visit to review MRI results at her request.   Geronimo Boot PA-C Neurosurgery

## 2022-05-27 ENCOUNTER — Ambulatory Visit (INDEPENDENT_AMBULATORY_CARE_PROVIDER_SITE_OTHER): Payer: Medicare Other | Admitting: Orthopedic Surgery

## 2022-05-27 DIAGNOSIS — G8929 Other chronic pain: Secondary | ICD-10-CM | POA: Diagnosis not present

## 2022-05-27 DIAGNOSIS — M5416 Radiculopathy, lumbar region: Secondary | ICD-10-CM

## 2022-05-27 DIAGNOSIS — Z981 Arthrodesis status: Secondary | ICD-10-CM

## 2022-05-27 DIAGNOSIS — M5442 Lumbago with sciatica, left side: Secondary | ICD-10-CM

## 2022-05-27 DIAGNOSIS — M5441 Lumbago with sciatica, right side: Secondary | ICD-10-CM | POA: Diagnosis not present

## 2022-06-02 NOTE — Progress Notes (Unsigned)
Referring Physician:  No referring provider defined for this encounter.  Primary Physician:  Leonel Ramsay, MD  History of Present Illness: 06/02/2022 [name] has been seeing PMR for postlaminectomy syndrome, SI joint dysfunction, neck pain, FM, and polyneuropathy. They have her on MS Contin and tramadol.   History of fusion L5-S1.   Last seen by me for telephone visit on 05/27/22 for her f/u of lumbar spine imaging. She as known slip at L4-L5 with DDD. Fusion looked good. MRI of lumbar spine ordered to continue possible injections.   She is here today for evaluation of her cervical spine.   6 month history of constant left sided neck pain with intermittent radiation to left arm (sometimes to shoulder, sometimes to wrist). Pain can be severe at times and is worse with any prolonged position. She has numbness and tingling in left arm. No gross weakness. She notes chronic balance issues, nothing new.    Conservative measures:  Physical therapy: none  Multimodal medical therapy including regular antiinflammatories: MS contin, ultram, zanaflex  Injections: Right: L5 Dorsa Ramus S1-S2-S3 lateral Branch Blocks with good relief noted.  Past Surgery: fusion L5-S1 with surgery in 1997 and 2003  Cindy Pratt has no symptoms of cervical myelopathy.  The symptoms are causing a significant impact on the patient's life.   Review of Systems:  A 10 point review of systems is negative, except for the pertinent positives and negatives detailed in the HPI.  Past Medical History: Past Medical History:  Diagnosis Date   Anxiety    Asthma    Belchings    Bipolar disorder ([place])    Chronic pain syndrome    in pain clinic   Degenerative joint disease    Depression    Disturbance of skin sensation    Fibromyalgia    GERD (gastroesophageal reflux disease)    History of kidney stones    Hyperlipidemia    Lumbar post-laminectomy syndrome    Lumbosacral neuritis     Osteoarthritis    Other chronic postoperative pain    Sciatica     Past Surgical History: Past Surgical History:  Procedure Laterality Date   ABDOMINAL HYSTERECTOMY     BACK SURGERY  1997, 2003   CYSTECTOMY     DIAGNOSTIC LAPAROSCOPY     EXTRACORPOREAL SHOCK WAVE LITHOTRIPSY Right 05/04/2018   Procedure: RIGHT EXTRACORPOREAL SHOCK WAVE LITHOTRIPSY (ESWL) WITH MAC;  Surgeon: Kathie Rhodes, MD;  Location: [facility];  Service: Urology;  Laterality: Right;   IR ANGIO INTRA EXTRACRAN SEL COM CAROTID INNOMINATE BILAT MOD SED  06/09/2020   IR ANGIO VERTEBRAL SEL VERTEBRAL BILAT MOD SED  06/09/2020   IR RADIOLOGIST EVAL & MGMT  08/10/2020   IR US GUIDE VASC ACCESS RIGHT  06/09/2020   WRIST ARTHROPLASTY Left    WRIST ARTHROSCOPY WITH DEBRIDEMENT Right 11/13/2015   Procedure: RIGHT WRIST ARTHROSCOPY WITH DEBRIDEMENT;  Surgeon: Leanora Cover, MD;  Location: [facility];  Service: Orthopedics;  Laterality: Right;    Allergies: Allergies as of 06/03/2022 - Review Complete 05/20/2022  Allergen Reaction Noted   Aspirin Shortness Of Breath 07/22/2008   Nsaids Shortness Of Breath 07/22/2008   Sulfa antibiotics Hives 10/20/2015   Sulfonamide derivatives Hives 07/22/2008   Tolmetin Shortness Of Breath 10/20/2015   Cymbalta [duloxetine hcl] Other (See Comments) 09/14/2011   Duloxetine Other (See Comments) 10/20/2015   Gabapentin Other (See Comments) 07/22/2008   Other Other (See Comments) 05/31/2012   Pregabalin Other (See Comments) 10/22/2008  Medications: Outpatient Encounter Medications as of 06/03/2022  Medication Sig   albuterol (VENTOLIN HFA) 108 (90 Base) MCG/ACT inhaler Inhale 2 puffs into the lungs every 6 (six) hours as needed for wheezing or shortness of breath.   ALPHA-LIPOIC ACID PO Take by mouth.   azelastine (ASTELIN) 0.1 % nasal spray Place 2 sprays into both nostrils daily. Use in each nostril as directed   Boswellia Serrata (BOSWELLIA PO) Take by mouth.   buPROPion  (WELLBUTRIN SR) 150 MG 12 hr tablet TAKE 1 TABLET BY MOUTH TWICE A DAY (NEEDAPPT FOR REFILLS)   COCONUT OIL PO Take by mouth.   COLLAGEN PO Take by mouth.   Ginger, Zingiber officinalis, (GINGER PO) Take by mouth.   lamoTRIgine (LAMICTAL) 100 MG tablet TAKE 1/2 A TABLET BY MOUTH 2 TIMES DAILY(NEED APPT FOR REFILLS)   lamoTRIgine (LAMICTAL) 25 MG tablet TAKE 1 TABLET BY MOUTH ONCE A DAY. TAKE WITH 100 MG FOR TOTAL DOSE OF 125 MG. (NEEED APPT FOR REFILLS)   morphine (MS CONTIN) 30 MG 12 hr tablet Take 1 tablet (30 mg total) by mouth every 8 (eight) hours.   pantoprazole (PROTONIX) 20 MG tablet Take 1 tablet (20 mg total) by mouth daily.   risperiDONE (RISPERDAL) 0.25 MG tablet TAKE 1 TABLET BY MOUTH EVERY NIGHT AT BEDTIME (NEED APPT FOR REFILLS)   SELENIUM PO Take by mouth.   tiZANidine (ZANAFLEX) 4 MG tablet Take 1 tablet (4 mg total) by mouth 2 (two) times daily.   topiramate (TOPAMAX) 50 MG tablet Take 1 tablet (50 mg total) by mouth 2 (two) times daily.   traMADol (ULTRAM) 50 MG tablet Take 1 tablet (50 mg total) by mouth 3 (three) times daily as needed. Do Not Fill Before 09/11/ 2023   Turmeric (QC TUMERIC COMPLEX PO) Take by mouth.   No facility-administered encounter medications on file as of 06/03/2022.    Social History: Social History   Tobacco Use   Smoking status: Former    Packs/day: 1.00    Years: 20.00    Total pack years: 20.00    Types: Cigarettes    Quit date: 2006    Years since quitting: 17.7   Smokeless tobacco: Never  Vaping Use   Vaping Use: Never used  Substance Use Topics   Alcohol use: No   Drug use: No    Family Medical History: Family History  Problem Relation Age of Onset   Hypertension Mother    Lung disease Mother    Hypertension Father    Heart attack Father 11   Drug abuse Father    Alcohol abuse Father    Breast cancer Maternal Grandmother    Liver cancer Maternal Grandmother    Cancer Maternal Grandmother        breast cancer    Diabetes Maternal Grandfather        both sides   Alcohol abuse Paternal Grandfather    Drug abuse Paternal Grandfather    Ovarian cancer Cousin    Cancer Cousin        1st c. maternal side/ cervical cancer   Cancer Cousin        1st c maternal/ colon cancer    Physical Examination: There were no vitals filed for this visit.  General: Patient is well developed, well nourished, calm, collected, and in no apparent distress. Attention to examination is appropriate.  Respiratory: Patient is breathing without any difficulty.   NEUROLOGICAL:     Awake, alert, oriented to person, place, and  time.  Speech is clear and fluent. Fund of knowledge is appropriate.   Cranial Nerves: Pupils equal round and reactive to light.  Facial tone is symmetric.  Facial sensation is symmetric.  ROM of spine:  Limited ROM of cervical spine with pain.   No abnormal lesions on exposed skin.   Strength: Side Biceps Triceps Deltoid Interossei Grip Wrist Ext. Wrist Flex.  R _0 L _1 Side Iliopsoas Quads Hamstring PF DF EHL  R _2 L _3 Reflexes are 2+ and symmetric at the biceps, triceps, brachioradialis, patella and achilles.   Hoffman's is absent.  Clonus is not present.   Bilateral upper and lower extremity sensation is intact to light touch.     No posterior cervical or left trapezial tenderness.   No tenderness at left shoulder.   Limited ROM of left shoulder- forward flexion and abduction to only 60 degrees with pain. Pain with limited IR/ER.   Gait is normal.     Medical Decision Making  Imaging: Cervical xrays 05/10/22:  FINDINGS: No evidence of acute fracture. Similar loss of normal cervical lordosis. Similar anterolisthesis of C3 on C4. Multilevel spondylosis with osteophytic spurring, disc space height loss and degenerative endplate changes greatest at C5-C6 and C6-C7 where it is moderate. Multilevel facet arthropathy. The dens is  well positioned between the lateral masses of C1. Normal prevertebral soft tissues.   IMPRESSION: No acute abnormality.   Moderate multilevel spondylosis greatest at C5-C6 and C6-C7 similar to 01/06/2021.     Electronically Signed   By: Placido Sou M.D.   On: 05/11/2022 22:52    I have personally reviewed the images and agree with the above interpretation.  Assessment and Plan: Ms. Gemme is a pleasant 56 y.o. female with constant left sided neck pain with intermittent radiation to left arm (sometimes to shoulder, sometimes to wrist).   Known moderate cervical spondylosis/DDD C5-C7. She also has very limited/painful ROM of her left shoulder as well which may be contributing to her pain.   Treatment options discussed with patient and following plan made:   - MRI of cervical spine to further evaluate left sided cervical radiculopathy. No improvement with time or medications. Wants to have done at [facility].  - Component of her pain is likely left shoulder mediated as well. Referral to ortho at [facility] for evaluation of this  - Continue current medications from PMR including MS Contin, ultram, and zanaflex.  - Once I get results back from cervical/lumbar MRI, will schedule her a f/u with me to review results.   I spent a total of 30 minutes in face-to-face and non-face-to-face activities related to this patient's care today.  Thank you for involving me in the care of this patient.  Geronimo Boot PA-C Dept. of Neurosurgery

## 2022-06-03 ENCOUNTER — Ambulatory Visit (INDEPENDENT_AMBULATORY_CARE_PROVIDER_SITE_OTHER): Payer: Medicare Other | Admitting: Orthopedic Surgery

## 2022-06-03 ENCOUNTER — Ambulatory Visit: Payer: Medicare Other | Admitting: Nurse Practitioner

## 2022-06-03 ENCOUNTER — Encounter: Payer: Self-pay | Admitting: Orthopedic Surgery

## 2022-06-03 VITALS — BP 126/77 | HR 62 | Ht 64.0 in | Wt 134.4 lb

## 2022-06-03 DIAGNOSIS — M50322 Other cervical disc degeneration at C5-C6 level: Secondary | ICD-10-CM | POA: Diagnosis not present

## 2022-06-03 DIAGNOSIS — M4722 Other spondylosis with radiculopathy, cervical region: Secondary | ICD-10-CM

## 2022-06-03 DIAGNOSIS — M47812 Spondylosis without myelopathy or radiculopathy, cervical region: Secondary | ICD-10-CM

## 2022-06-03 DIAGNOSIS — M25512 Pain in left shoulder: Secondary | ICD-10-CM | POA: Diagnosis not present

## 2022-06-03 DIAGNOSIS — M5030 Other cervical disc degeneration, unspecified cervical region: Secondary | ICD-10-CM

## 2022-06-03 DIAGNOSIS — M5412 Radiculopathy, cervical region: Secondary | ICD-10-CM

## 2022-06-03 NOTE — Patient Instructions (Signed)
It was so nice to see you today, I am sorry that you are hurting so much.   Your neck xrays showed wear and tear (arthritis). This may be causing some of your pain.   I also think that some of your pain is coming from your left shoulder.   I want you to see ortho at the [facility] for the shoulder. They will call you to schedule this.    I want to get an MRI of your neck to look into things further. We will call you to set this up.   Once we get the MRI results back, will call to set up follow up appointment to review.   Please do not hesitate to call if you have any questions or concerns. You can also message me in [place].   If you have not heard back about any of the tests/procedures in the next week, please call the office so we can help you get these things scheduled.   Geronimo Boot PA-C [phone]

## 2022-06-07 ENCOUNTER — Encounter: Payer: Medicare Other | Admitting: Registered Nurse

## 2022-06-07 ENCOUNTER — Telehealth: Payer: Self-pay | Admitting: *Deleted

## 2022-06-07 NOTE — Telephone Encounter (Signed)
-----   Message from Nelva Bush, MD sent at 06/07/2022 10:46 AM EDT ----- Regarding: Follow-up chest CT Coronary CTA last year showed 2 tiny lung nodules with recommendation for follow-up CT chest without contrast in 1 year.  It looks like the scan has been ordered but not yet scheduled/completed.  Could you help arrange for the scan to be scheduled/completed at the patient's earliest convenience?  Let me know if any questions or concerns arise.  Thanks.  Chris End

## 2022-06-08 ENCOUNTER — Encounter: Payer: Medicare Other | Attending: Registered Nurse | Admitting: Registered Nurse

## 2022-06-08 ENCOUNTER — Encounter: Payer: Self-pay | Admitting: Registered Nurse

## 2022-06-08 VITALS — BP 107/72 | HR 71 | Ht 64.0 in | Wt 134.4 lb

## 2022-06-08 DIAGNOSIS — M542 Cervicalgia: Secondary | ICD-10-CM | POA: Diagnosis present

## 2022-06-08 DIAGNOSIS — M961 Postlaminectomy syndrome, not elsewhere classified: Secondary | ICD-10-CM | POA: Diagnosis present

## 2022-06-08 DIAGNOSIS — G894 Chronic pain syndrome: Secondary | ICD-10-CM | POA: Diagnosis present

## 2022-06-08 DIAGNOSIS — G8929 Other chronic pain: Secondary | ICD-10-CM | POA: Diagnosis present

## 2022-06-08 DIAGNOSIS — M545 Low back pain, unspecified: Secondary | ICD-10-CM | POA: Insufficient documentation

## 2022-06-08 DIAGNOSIS — M5412 Radiculopathy, cervical region: Secondary | ICD-10-CM | POA: Diagnosis present

## 2022-06-08 DIAGNOSIS — M792 Neuralgia and neuritis, unspecified: Secondary | ICD-10-CM | POA: Diagnosis present

## 2022-06-08 DIAGNOSIS — Z79891 Long term (current) use of opiate analgesic: Secondary | ICD-10-CM | POA: Diagnosis present

## 2022-06-08 DIAGNOSIS — M797 Fibromyalgia: Secondary | ICD-10-CM

## 2022-06-08 DIAGNOSIS — Z5181 Encounter for therapeutic drug level monitoring: Secondary | ICD-10-CM

## 2022-06-08 DIAGNOSIS — G5793 Unspecified mononeuropathy of bilateral lower limbs: Secondary | ICD-10-CM | POA: Diagnosis present

## 2022-06-08 MED ORDER — MORPHINE SULFATE ER 30 MG PO TBCR: 30.0000 mg | EXTENDED_RELEASE_TABLET | Freq: Three times a day (TID) | ORAL | 0 refills | Status: DC
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
## 2022-06-08 NOTE — Telephone Encounter (Signed)
Left voicemail message to call back in order to set up test.

## 2022-06-11 LAB — TOXASSURE SELECT,+ANTIDEPR,UR

## 2022-06-15 ENCOUNTER — Other Ambulatory Visit: Payer: Self-pay | Admitting: Orthopedic Surgery

## 2022-06-15 DIAGNOSIS — M47812 Spondylosis without myelopathy or radiculopathy, cervical region: Secondary | ICD-10-CM

## 2022-06-15 DIAGNOSIS — M5416 Radiculopathy, lumbar region: Secondary | ICD-10-CM

## 2022-06-15 DIAGNOSIS — M5412 Radiculopathy, cervical region: Secondary | ICD-10-CM

## 2022-06-15 DIAGNOSIS — Z981 Arthrodesis status: Secondary | ICD-10-CM

## 2022-06-15 DIAGNOSIS — G8929 Other chronic pain: Secondary | ICD-10-CM

## 2022-06-15 DIAGNOSIS — M5030 Other cervical disc degeneration, unspecified cervical region: Secondary | ICD-10-CM

## 2022-06-15 NOTE — Telephone Encounter (Signed)
2nd attempt to call the patient. No answer- I left a message to please call back.  

## 2022-06-16 NOTE — Telephone Encounter (Signed)
3rd attempt to contact the patient.  No answer- I left a message to please call back.  Due to 3rd attempt a Mychart message is being sent to the patient as she has recently logged in to her account.   Closing this encounter.

## 2022-06-21 ENCOUNTER — Telehealth: Payer: Self-pay | Admitting: *Deleted

## 2022-06-21 NOTE — Telephone Encounter (Signed)
Urine drug screen for this encounter is consistent for prescribed medication 

## 2022-06-22 NOTE — Telephone Encounter (Signed)
Please assist to schedule patient

## 2022-06-23 ENCOUNTER — Other Ambulatory Visit: Payer: Self-pay | Admitting: Psychiatry

## 2022-06-23 DIAGNOSIS — F4310 Post-traumatic stress disorder, unspecified: Secondary | ICD-10-CM

## 2022-06-28 ENCOUNTER — Ambulatory Visit
Admission: RE | Admit: 2022-06-28 | Discharge: 2022-06-28 | Disposition: A | Discharge status: Home / Self Care | Payer: Medicare Other | Source: Ambulatory Visit | Attending: Orthopedic Surgery | Admitting: Orthopedic Surgery

## 2022-06-28 DIAGNOSIS — M47812 Spondylosis without myelopathy or radiculopathy, cervical region: Secondary | ICD-10-CM

## 2022-06-28 DIAGNOSIS — M5412 Radiculopathy, cervical region: Secondary | ICD-10-CM

## 2022-06-28 DIAGNOSIS — G8929 Other chronic pain: Secondary | ICD-10-CM

## 2022-06-28 DIAGNOSIS — Z981 Arthrodesis status: Secondary | ICD-10-CM

## 2022-06-28 DIAGNOSIS — M5030 Other cervical disc degeneration, unspecified cervical region: Secondary | ICD-10-CM

## 2022-06-28 DIAGNOSIS — M5416 Radiculopathy, lumbar region: Secondary | ICD-10-CM

## 2022-06-29 ENCOUNTER — Other Ambulatory Visit: Payer: Medicare Other

## 2022-07-01 NOTE — Progress Notes (Signed)
Telephone Visit- Progress Note: Referring Physician:  Leonel Ramsay, MD [place],  [place] [address]  Primary Physician:  Leonel Ramsay, MD  This visit was performed via telephone.  Patient location: home Provider location: office  I spent a total of 20 minutes non-face-to-face activities for this visit on the date of this encounter including review of current clinical condition and response to treatment.   Chief Complaint:  review cervical and lumbar MRI scans  History of Present Illness: Cindy Pratt is a 56 y.o. female was last seen by me on 06/03/22. She has known moderate cervical spondylosis/DDD C5-C7. She also has very limited/painful ROM of her left shoulder as well which may be contributing to her pain.   She continues with constant left sided neck pain with intermittent radiation to left arm (sometimes to shoulder, sometimes to wrist). She feels like her shoulder is better and she can move her arm more than at her last visit. [place] ortho has called for her a visit, but she has not scheduled anything yet.   History of fusion L5-S1. She has known slip at L4-L5 with DDD. Fusion looked good on xrays.   She also continues with constant LBP with intermittent bilateral anterior thigh pain x years. Pain is worse when she gets out of bed or tries to get up from prolonged sitting. Pain also worse with prolonged walking.   She is seeing PMR for postlaminectomy syndrome, SI joint dysfunction, neck pain, FM, and polyneuropathy. They have her on MS Contin and tramadol.   Exam: No exam done as this was a telephone encounter.     Imaging: MRI cervical spine dated 06/28/22:  FINDINGS: Alignment: Straightening of the cervical lordosis with mild kyphosis. Normal sagittal alignment   Vertebrae: Negative for fracture or mass. Small hemangioma C5 and C7 vertebral bodies.   Cord: Normal signal and morphology   Posterior Fossa, vertebral arteries,  paraspinal tissues: Negative   Disc levels:   C2-3: Negative   C3-4: Mild facet degeneration.  Negative for stenosis   C4-5: Small central disc protrusion and mild facet degeneration. Negative for spinal or foraminal stenosis   C5-6: Disc degeneration with diffuse uncinate spurring. Moderate right foraminal narrowing and mild left foraminal narrowing. Adequate CSF around the cord   C6-7: Disc degeneration with diffuse uncinate spurring. Mild foraminal narrowing bilaterally   C7-T1: Negative   IMPRESSION: Cervical spondylosis. Moderate right foraminal narrowing C5-6 due to spurring. Mild left foraminal narrowing C5-6. Mild foraminal narrowing bilaterally C6-7 due to spurring.     Electronically Signed   By: Franchot Gallo M.D.   On: 06/30/2022 16:36  MRI lumbar spine dated 06/28/22:  FINDINGS: Segmentation:  5 lumbar vertebra.  Low disc space L5-S1.   Alignment:  Normal   Vertebrae:  Negative for fracture or mass.   Conus medullaris and cauda equina: Conus extends to the L1 level. Conus and cauda equina appear normal.   Paraspinal and other soft tissues: Negative for paraspinous mass or adenopathy   Disc levels:   L1-2: Negative   L2-3: Mild facet degeneration. Negative for disc protrusion or stenosis   L3-4: Mild disc and mild facet degeneration. Negative for disc protrusion or stenosis   L4-5: Mild disc bulging and moderate facet hypertrophy. Mild subarticular stenosis on the left with mild worsening since 2018 MRI   L5-S1: Bilateral pedicle screw fusion. Marked disc space narrowing. Left laminectomy. Negative for neural impingement. No interval change.   IMPRESSION: 1. Mild  subarticular stenosis on the left L4-5 with mild worsening since 2018. 2. Bilateral pedicle screw fusion L5-S1 without neural impingement.     Electronically Signed   By: Marlan Palau M.D.   On: 06/30/2022 16:50  I have personally reviewed the images and agree with the  above interpretation.  Assessment and Plan: Cindy Pratt is a pleasant 56 y.o. female with constant left sided neck pain with intermittent radiation to left arm (sometimes to shoulder, sometimes to wrist).   Known moderate cervical spondylosis/DDD C5-C7 with some mild foraminal narrowing C5-C7. Some of her pain may be shoulder mediated as well.    She also continues with constant LBP with intermittent bilateral anterior thigh pain x years. Pain is worse when she gets out of bed or tries to get up from prolonged sitting. Pain also worse with prolonged walking.   History of fusion L5-S1. She has known slip at L4-L5 with DDD with mild left subarticular stenosis on MRI.   Treatment options discussed with patient and following plan made:   - Order for physical therapy for cervical  and lumbar spine at [facility] on [address].  - Recommend she follow up with her pain management provider to discuss possible cervical and lumbar injections. Message sent to Jacalyn Lefevre NP.  - I still recommend she follow up with [facility] ortho for evaluation of left shoulder pain. They have called her but she has not scheduled an appointment.  - Continue on current medications including MS Contin and tramadol from pain management.  - Follow up in 2 months with me for recheck.   Drake Leach PA-C Neurosurgery

## 2022-07-02 ENCOUNTER — Other Ambulatory Visit: Payer: Self-pay | Admitting: *Deleted

## 2022-07-02 ENCOUNTER — Other Ambulatory Visit: Payer: Medicare Other

## 2022-07-02 ENCOUNTER — Encounter: Payer: Self-pay | Admitting: Orthopedic Surgery

## 2022-07-02 ENCOUNTER — Ambulatory Visit (INDEPENDENT_AMBULATORY_CARE_PROVIDER_SITE_OTHER): Payer: Medicare Other | Admitting: Orthopedic Surgery

## 2022-07-02 DIAGNOSIS — Z981 Arthrodesis status: Secondary | ICD-10-CM

## 2022-07-02 DIAGNOSIS — M5030 Other cervical disc degeneration, unspecified cervical region: Secondary | ICD-10-CM

## 2022-07-02 DIAGNOSIS — M5416 Radiculopathy, lumbar region: Secondary | ICD-10-CM | POA: Diagnosis not present

## 2022-07-02 DIAGNOSIS — G8929 Other chronic pain: Secondary | ICD-10-CM

## 2022-07-02 DIAGNOSIS — M4722 Other spondylosis with radiculopathy, cervical region: Secondary | ICD-10-CM | POA: Diagnosis not present

## 2022-07-02 DIAGNOSIS — M25512 Pain in left shoulder: Secondary | ICD-10-CM

## 2022-07-02 DIAGNOSIS — M47812 Spondylosis without myelopathy or radiculopathy, cervical region: Secondary | ICD-10-CM

## 2022-07-02 DIAGNOSIS — M5442 Lumbago with sciatica, left side: Secondary | ICD-10-CM

## 2022-07-02 DIAGNOSIS — M5441 Lumbago with sciatica, right side: Secondary | ICD-10-CM

## 2022-07-02 DIAGNOSIS — M5412 Radiculopathy, cervical region: Secondary | ICD-10-CM

## 2022-07-02 DIAGNOSIS — R911 Solitary pulmonary nodule: Secondary | ICD-10-CM

## 2022-07-03 ENCOUNTER — Encounter (HOSPITAL_COMMUNITY): Payer: Self-pay

## 2022-07-03 ENCOUNTER — Emergency Department (HOSPITAL_COMMUNITY): Payer: Medicare Other

## 2022-07-03 ENCOUNTER — Emergency Department (HOSPITAL_COMMUNITY)
Admission: EM | Admit: 2022-07-03 | Discharge: 2022-07-04 | Disposition: A | Discharge status: Home / Self Care | Payer: Medicare Other | Attending: Emergency Medicine | Admitting: Emergency Medicine

## 2022-07-03 ENCOUNTER — Other Ambulatory Visit: Payer: Self-pay

## 2022-07-03 DIAGNOSIS — S32018A Other fracture of first lumbar vertebra, initial encounter for closed fracture: Secondary | ICD-10-CM | POA: Diagnosis not present

## 2022-07-03 DIAGNOSIS — S3992XA Unspecified injury of lower back, initial encounter: Secondary | ICD-10-CM | POA: Diagnosis present

## 2022-07-03 DIAGNOSIS — X509XXA Other and unspecified overexertion or strenuous movements or postures, initial encounter: Secondary | ICD-10-CM | POA: Insufficient documentation

## 2022-07-03 DIAGNOSIS — S32010A Wedge compression fracture of first lumbar vertebra, initial encounter for closed fracture: Secondary | ICD-10-CM

## 2022-07-03 MED ORDER — METHOCARBAMOL 1000 MG/10ML IJ SOLN
1000.0000 mg | Freq: Once | INTRAVENOUS | Status: AC
  Administered 2022-07-04: 1000 mg via INTRAVENOUS
  Filled 2022-07-03: qty 10

## 2022-07-03 MED ORDER — MORPHINE SULFATE (PF) 4 MG/ML IV SOLN
4.0000 mg | Freq: Once | INTRAVENOUS | Status: AC
  Administered 2022-07-04: 4 mg via INTRAVENOUS
  Filled 2022-07-03: qty 1

## 2022-07-03 NOTE — ED Notes (Signed)
Patient transported to CT 

## 2022-07-03 NOTE — ED Triage Notes (Signed)
Pt picked up something and hard a pop in her back. Pt reports pain worsening. Hx of spinal fusion.

## 2022-07-03 NOTE — ED Provider Notes (Signed)
[facility] EMERGENCY DEPARTMENT Provider Note   CSN: [id number] Arrival date & time: 07/03/22  2208     History {Add pertinent medical, surgical, social history, OB history to HPI:1} Chief complaint: Low back pain  Cindy Pratt is a 56 y.o. female.  The history is provided by the patient.  She has history of hyperlipidemia, bipolar disorder, chronic pain, lumbar fusion and states that she had sudden onset of low back pain when she bent over and stood up.  She felt something pop in her lower back.  Pain is nonradiating.  She denies numbness or tingling.  Denies bowel or bladder dysfunction.    Home Medications Prior to Admission medications   Medication Sig Start Date End Date Taking? Authorizing Provider  albuterol (VENTOLIN HFA) 108 (90 Base) MCG/ACT inhaler Inhale 2 puffs into the lungs every 6 (six) hours as needed for wheezing or shortness of breath. 07/04/19   [provider]  ALPHA-LIPOIC ACID PO Take by mouth.    [provider]  Azucena Freed Serrata (BOSWELLIA PO) Take by mouth.    [provider]  buPROPion (WELLBUTRIN SR) 150 MG 12 hr tablet TAKE 1 TABLET BY MOUTH TWICE A DAY (NEEDAPPT FOR REFILLS) 05/11/22   Ursula Alert, MD  COCONUT OIL PO Take by mouth.    [provider]  COLLAGEN PO Take by mouth.    [provider]  Ginger, Zingiber officinalis, (GINGER PO) Take by mouth.    [provider]  lamoTRIgine (LAMICTAL) 100 MG tablet TAKE 1/2 A TABLET BY MOUTH 2 TIMES DAILY(NEED APPT FOR REFILLS) 04/19/22   Ursula Alert, MD  lamoTRIgine (LAMICTAL) 25 MG tablet TAKE 1 TABLET BY MOUTH ONCE A DAY. TAKE WITH 100 MG FOR TOTAL DOSE OF 125 MG. (NEEED APPT FOR REFILLS) 04/20/22   Ursula Alert, MD  morphine (MS CONTIN) 30 MG 12 hr tablet Take 1 tablet (30 mg total) by mouth every 8 (eight) hours. 06/08/22   Bayard Hugger, NP  risperiDONE (RISPERDAL) 0.25 MG tablet TAKE 1 TABLET BY MOUTH EVERY NIGHT AT BEDTIME (NEED APPT FOR  REFILLS) 04/19/22   Ursula Alert, MD  SELENIUM PO Take by mouth.    [provider]  tiZANidine (ZANAFLEX) 4 MG tablet Take 1 tablet (4 mg total) by mouth 2 (two) times daily. 05/10/22   Bayard Hugger, NP  topiramate (TOPAMAX) 50 MG tablet Take 1 tablet (50 mg total) by mouth 2 (two) times daily. 05/10/22   Bayard Hugger, NP  traMADol (ULTRAM) 50 MG tablet Take 1 tablet (50 mg total) by mouth 3 (three) times daily as needed. Do Not Fill Before 09/11/ 2023 05/10/22   Bayard Hugger, NP  Turmeric (QC TUMERIC COMPLEX PO) Take by mouth.    [provider]      Allergies    Aspirin, Nsaids, Sulfa antibiotics, Sulfonamide derivatives, Tolmetin, Cymbalta [duloxetine hcl], Duloxetine, Gabapentin, Other, and Pregabalin    Review of Systems   Review of Systems  All other systems reviewed and are negative.   Physical Exam Updated Vital Signs BP 128/86 (BP Location: Right Arm)   Pulse 72   Temp 98.4 F (36.9 C) (Oral)   Resp 16   Ht 5\' 4"  (1.626 m)   Wt 63 kg   SpO2 100%   BMI 23.86 kg/m  Physical Exam Vitals and nursing note reviewed.   56 year old female, resting comfortably and in no acute distress. Vital signs are normal. Oxygen saturation is 100%, which  is normal. Head is normocephalic and atraumatic. PERRLA, EOMI. Oropharynx is clear. Neck is nontender and supple without adenopathy or JVD. Back is moderately tender in the mid lumbar area.  There is moderate bilateral paralumbar spasm which is more prominent on the left.  Straight leg raise is positive bilaterally at 45 degrees.  There is no CVA tenderness. Lungs are clear without rales, wheezes, or rhonchi. Chest is nontender. Heart has regular rate and rhythm without murmur. Abdomen is soft, flat, nontender. Extremities have no cyanosis or edema, full range of motion is present. Skin is warm and dry without rash. Neurologic: Mental status is normal, cranial nerves are intact, strength is 5/5 in all 4  extremities.  No sensory deficits identified.  ED Results / Procedures / Treatments    Radiology No results found.  Procedures Procedures  {Document cardiac monitor, telemetry assessment procedure when appropriate:1}  Medications Ordered in ED Medications - No data to display  ED Course/ Medical Decision Making/ A&P                           Medical Decision Making  Acute lumbar pain, I strongly suspect this is all muscular.  However, she does have history of spinal fusion, we will get two-view lumbar spine x-rays to make sure no injury occurred in the fused area.  Unfortunately, she is intolerant of NSAIDs.  I am ordering intravenous methocarbamol.  She takes morphine on a regular basis, I we will also order a dose of morphine.  I have reviewed her old records, and she had an MRI of the lumbar spine on 06/28/2022 showing L5-S1 fusion, mild subarticular stenosis on the left L4-5.  {Document critical care time when appropriate:1} {Document review of labs and clinical decision tools ie heart score, Chads2Vasc2 etc:1}  {Document your independent review of radiology images, and any outside records:1} {Document your discussion with family members, caretakers, and with consultants:1} {Document social determinants of health affecting pt's care:1} {Document your decision making why or why not admission, treatments were needed:1} Final Clinical Impression(s) / ED Diagnoses Final diagnoses:  None    Rx / DC Orders ED Discharge Orders     None

## 2022-07-04 DIAGNOSIS — S32018A Other fracture of first lumbar vertebra, initial encounter for closed fracture: Secondary | ICD-10-CM | POA: Diagnosis not present

## 2022-07-04 MED ORDER — METHOCARBAMOL 1000 MG/10ML IJ SOLN
INTRAMUSCULAR | Status: AC
  Filled 2022-07-04: qty 10

## 2022-07-04 NOTE — ED Notes (Signed)
Patient transported to CT 

## 2022-07-04 NOTE — Discharge Instructions (Signed)
Wear the back brace as needed.  Continue taking morphine for pain.  Because you have reason for acute pain, you may need a higher dose of your morphine.  You need to contact the provider who has been prescribing morphine if you do need a dose adjustment.  Talk with your primary care provider or pain management doctor about whether you would be a good candidate to be referred for kyphoplasty (injecting cement into the collapsed vertebrae to stabilize it).

## 2022-07-04 NOTE — ED Notes (Signed)
Notified [facility] of patient needing a TLSO brace. 

## 2022-07-05 ENCOUNTER — Telehealth: Payer: Self-pay

## 2022-07-05 NOTE — Progress Notes (Unsigned)
Referring Physician:  Leonel Ramsay, MD [place],  [place] [address]  Primary Physician:  Leonel Ramsay, MD  History of Present Illness: 07/06/2022  I spoke with her last week to review her cervical and lumbar MRI results. She has known moderate cervical spondylosis/DDD C5-C7 with some mild foraminal narrowing C5-C7. Some of her pain may be shoulder mediated as well.     Also with history of fusion L5-S1. She has known slip at L4-L5 with DDD with mild left subarticular stenosis on MRI.   PT was ordered and she was to follow up with her pain provider regarding lumbar injections (they don't do cervical).   She was seen in ED on 07/03/22 with severe LBP after bending over and feeling a pop. She was diagnosed with L1 compression fracture. She was given TLSO brace and advised to follow up with [place] and her pain management provider.   She has constant LBP that has been severe since this weekend. No significant leg pain. She has intermittent tingling in her legs. No numbness. She feels like she has diffuse weakness. She is wearing her TLSO brace.   She is seeing PMR for postlaminectomy syndrome, SI joint dysfunction, neck pain, FM, and polyneuropathy. They have her on MS Contin and tramadol.    The symptoms are causing a significant impact on the patient's life.   Review of Systems:  A 10 point review of systems is negative, except for the pertinent positives and negatives detailed in the HPI.  Past Medical History: Past Medical History:  Diagnosis Date   Anxiety    Asthma    Belchings    Bipolar disorder ([place])    Chronic pain syndrome    in pain clinic   Degenerative joint disease    Depression    Disturbance of skin sensation    Fibromyalgia    GERD (gastroesophageal reflux disease)    History of kidney stones    Hyperlipidemia    Lumbar post-laminectomy syndrome    Lumbosacral neuritis    Osteoarthritis    Other chronic postoperative pain     Sciatica     Past Surgical History: Past Surgical History:  Procedure Laterality Date   ABDOMINAL HYSTERECTOMY     BACK SURGERY  1997, 2003   CYSTECTOMY     DIAGNOSTIC LAPAROSCOPY     EXTRACORPOREAL SHOCK WAVE LITHOTRIPSY Right 05/04/2018   Procedure: RIGHT EXTRACORPOREAL SHOCK WAVE LITHOTRIPSY (ESWL) WITH MAC;  Surgeon: Kathie Rhodes, MD;  Location: [facility];  Service: Urology;  Laterality: Right;   IR ANGIO INTRA EXTRACRAN SEL COM CAROTID INNOMINATE BILAT MOD SED  06/09/2020   IR ANGIO VERTEBRAL SEL VERTEBRAL BILAT MOD SED  06/09/2020   IR RADIOLOGIST EVAL & MGMT  08/10/2020   IR US GUIDE VASC ACCESS RIGHT  06/09/2020   WRIST ARTHROPLASTY Left    WRIST ARTHROSCOPY WITH DEBRIDEMENT Right 11/13/2015   Procedure: RIGHT WRIST ARTHROSCOPY WITH DEBRIDEMENT;  Surgeon: Leanora Cover, MD;  Location: [facility];  Service: Orthopedics;  Laterality: Right;    Allergies: Allergies as of 07/06/2022 - Review Complete 07/06/2022  Allergen Reaction Noted   Aspirin Shortness Of Breath 07/22/2008   Nsaids Shortness Of Breath 07/22/2008   Sulfa antibiotics Hives 10/20/2015   Sulfonamide derivatives Hives 07/22/2008   Tolmetin Shortness Of Breath 10/20/2015   Cymbalta [duloxetine hcl] Other (See Comments) 09/14/2011   Duloxetine Other (See Comments) 10/20/2015   Gabapentin Other (See Comments) 07/22/2008   Other Other (See Comments)  05/31/2012   Pregabalin Other (See Comments) 10/22/2008    Medications: Outpatient Encounter Medications as of 07/06/2022  Medication Sig   albuterol (VENTOLIN HFA) 108 (90 Base) MCG/ACT inhaler Inhale 2 puffs into the lungs every 6 (six) hours as needed for wheezing or shortness of breath.   ALPHA-LIPOIC ACID PO Take by mouth.   Boswellia Serrata (BOSWELLIA PO) Take by mouth.   buPROPion (WELLBUTRIN SR) 150 MG 12 hr tablet TAKE 1 TABLET BY MOUTH TWICE A DAY (NEEDAPPT FOR REFILLS)   COCONUT OIL PO Take by mouth.   COLLAGEN PO Take by mouth.   Ginger,  Zingiber officinalis, (GINGER PO) Take by mouth.   lamoTRIgine (LAMICTAL) 100 MG tablet TAKE 1/2 A TABLET BY MOUTH 2 TIMES DAILY(NEED APPT FOR REFILLS)   lamoTRIgine (LAMICTAL) 25 MG tablet TAKE 1 TABLET BY MOUTH ONCE A DAY. TAKE WITH 100 MG FOR TOTAL DOSE OF 125 MG. (NEEED APPT FOR REFILLS)   morphine (MS CONTIN) 30 MG 12 hr tablet Take 1 tablet (30 mg total) by mouth every 8 (eight) hours.   SELENIUM PO Take by mouth.   tiZANidine (ZANAFLEX) 4 MG tablet Take 1 tablet (4 mg total) by mouth 2 (two) times daily.   topiramate (TOPAMAX) 50 MG tablet Take 1 tablet (50 mg total) by mouth 2 (two) times daily.   traMADol (ULTRAM) 50 MG tablet Take 1 tablet (50 mg total) by mouth 3 (three) times daily as needed. Do Not Fill Before 09/11/ 2023   Turmeric (QC TUMERIC COMPLEX PO) Take by mouth.   VRAYLAR 1.5 MG capsule Take 1.5 mg by mouth daily.   [DISCONTINUED] risperiDONE (RISPERDAL) 0.25 MG tablet TAKE 1 TABLET BY MOUTH EVERY NIGHT AT BEDTIME (NEED APPT FOR REFILLS)   No facility-administered encounter medications on file as of 07/06/2022.    Social History: Social History   Tobacco Use   Smoking status: Former    Packs/day: 1.00    Years: 20.00    Total pack years: 20.00    Types: Cigarettes    Quit date: 2006    Years since quitting: 17.8   Smokeless tobacco: Never  Vaping Use   Vaping Use: Never used  Substance Use Topics   Alcohol use: No   Drug use: No    Family Medical History: Family History  Problem Relation Age of Onset   Hypertension Mother    Lung disease Mother    Hypertension Father    Heart attack Father 54   Drug abuse Father    Alcohol abuse Father    Breast cancer Maternal Grandmother    Liver cancer Maternal Grandmother    Cancer Maternal Grandmother        breast cancer   Diabetes Maternal Grandfather        both sides   Alcohol abuse Paternal Grandfather    Drug abuse Paternal Grandfather    Ovarian cancer Cousin    Cancer Cousin        1st c. maternal  side/ cervical cancer   Cancer Cousin        1st c maternal/ colon cancer    Physical Examination: Vitals:   07/06/22 1336  BP: 121/77  Pulse: 76    General: Patient is well developed, well nourished, calm, collected, and in no apparent distress. Attention to examination is appropriate.   She is tearful.   Respiratory: Patient is breathing without any difficulty.   NEUROLOGICAL:     Awake, alert, oriented to person, place, and time.  Speech is clear and fluent. Fund of knowledge is appropriate.   Cranial Nerves: Pupils equal round and reactive to light.  Facial tone is symmetric.  Facial sensation is symmetric.  ROM of lumbar spine not tested.   No abnormal lesions on exposed skin.   Strength: Side Biceps Triceps Deltoid Interossei Grip Wrist Ext. Wrist Flex.  R _0 L _1 Side Iliopsoas Quads Hamstring PF DF EHL  R _2 L _3 Reflexes are 2+ and symmetric at the biceps, triceps, brachioradialis, patella and achilles.   Hoffman's is absent.  Clonus is not present.   Bilateral upper and lower extremity sensation is intact to light touch.     Has slow gait.   Medical Decision Making  Imaging: Xrays of lumbar spine dated 07/03/22:  FINDINGS: Superior endplate compression with irregularity about the anterior superior cortex of the L1 vertebral body suspicious for acute compression fracture. No definite retropulsion.   Posterior fusion L5-S1. No radiographic evidence of loosening. Unchanged grade 1 anterolisthesis of L4 on L5. Mild degenerative disc disease at L4-L5.   IMPRESSION: Suspect acute compression fracture of L1 with approximately 25% vertebral body height loss. Consider CT for further evaluation.     Electronically Signed   By: Placido Sou M.D.   On: 07/03/2022 23:42  CT scan of lumbar spine dated 07/03/22:  FINDINGS: Segmentation: Standard. Lowest well-formed disc space labeled the L5-S1 level.    Alignment: 4 mm facet mediated anterolisthesis of L4 on L5. Underlying mild levoscoliosis.   Vertebrae: Acute compression fracture involving the superior endplate of L1 with up to 25% height loss without bony retropulsion. Vertebral body height otherwise maintained. Visualized sacrum and pelvis intact. No worrisome osseous lesions. Prior PLIF at L5-S1 without hardware complication.   Paraspinal and other soft tissues: Paraspinous soft tissues demonstrate no other acute finding. Mild aortic atherosclerosis.   Disc levels: Prior PLIF at L5-S1. 4 mm facet mediated anterolisthesis of L4 on L5. No significant stenosis evident by CT.   IMPRESSION: 1. Acute compression fracture involving the superior endplate of L1 with up to 25% height loss without bony retropulsion. 2. Prior PLIF at L5-S1 without hardware complication. 3. 4 mm facet mediated anterolisthesis of L4 on L5 without significant spinal stenosis.   Aortic Atherosclerosis (ICD10-I70.0).   Electronically Signed   By: Jeannine Boga M.D.   On: 07/04/2022 00:47  I have personally reviewed the images and agree with the above interpretation.  Assessment and Plan: Ms. Siebels is a pleasant 56 y.o. female with severe constant LBP after bending this weekend and hearing a pop. Updated imaging shows L1 compression fracture.   She has history of fusion L5-S1. She has known slip at L4-L5 with DDD with mild left subarticular stenosis on MRI. New acute LBP is likely due to L1 compression fracture.   Treatment options discussed with patient and following plan made:   - MRI of lumbar spine to further evaluate L1 compression fracture.  - Continue with TLSO brace. No bending, twisting, or lifting.  - Discuss further pain medications with pain management. She has virtual appointment with them today.  - Depending on results of MRI, will likely send her for consultation for possible kyphoplasty with IR. She is very interested in this.   - Will set up phone visit to review lumbar MRI results.   I spent  a total of 20 minutes in face-to-face and non-face-to-face activities related to this patient's care toda including review of outside records, review of imaging, review of symptoms, physical exam, discussion of differential diagnosis, discussion of treatment options, and documentation.   Thank you for involving me in the care of this patient.   Geronimo Boot PA-C Dept. of Neurosurgery

## 2022-07-05 NOTE — Telephone Encounter (Signed)
Addressed in another message.

## 2022-07-05 NOTE — Telephone Encounter (Signed)
Please get her into see me for evaluation in clinic. Can put in f/u or new slot.

## 2022-07-05 NOTE — Telephone Encounter (Signed)
-----   Message from [place] sent at 07/05/2022 11:53 AM EST ----- Regarding: Fracture Called to ask if Stacy or someone could look at information sent over (should be in the chart?) from an ED visit. X-rays and CT were done in the ED of lower back.  Says she has a fracture.    [place], [place] [phone]

## 2022-07-06 ENCOUNTER — Encounter: Payer: Self-pay | Admitting: Orthopedic Surgery

## 2022-07-06 ENCOUNTER — Ambulatory Visit (INDEPENDENT_AMBULATORY_CARE_PROVIDER_SITE_OTHER): Payer: Medicare Other | Admitting: Orthopedic Surgery

## 2022-07-06 ENCOUNTER — Encounter: Payer: Self-pay | Admitting: Registered Nurse

## 2022-07-06 ENCOUNTER — Encounter: Payer: Medicare Other | Attending: Registered Nurse | Admitting: Registered Nurse

## 2022-07-06 VITALS — BP 121/77 | Ht 64.0 in | Wt 131.0 lb

## 2022-07-06 VITALS — BP 121/77 | HR 76 | Ht 64.0 in | Wt 131.8 lb

## 2022-07-06 DIAGNOSIS — M5412 Radiculopathy, cervical region: Secondary | ICD-10-CM | POA: Insufficient documentation

## 2022-07-06 DIAGNOSIS — M961 Postlaminectomy syndrome, not elsewhere classified: Secondary | ICD-10-CM | POA: Insufficient documentation

## 2022-07-06 DIAGNOSIS — G5793 Unspecified mononeuropathy of bilateral lower limbs: Secondary | ICD-10-CM | POA: Diagnosis not present

## 2022-07-06 DIAGNOSIS — M542 Cervicalgia: Secondary | ICD-10-CM | POA: Insufficient documentation

## 2022-07-06 DIAGNOSIS — G894 Chronic pain syndrome: Secondary | ICD-10-CM | POA: Insufficient documentation

## 2022-07-06 DIAGNOSIS — M797 Fibromyalgia: Secondary | ICD-10-CM | POA: Insufficient documentation

## 2022-07-06 DIAGNOSIS — S32010A Wedge compression fracture of first lumbar vertebra, initial encounter for closed fracture: Secondary | ICD-10-CM | POA: Diagnosis not present

## 2022-07-06 DIAGNOSIS — M792 Neuralgia and neuritis, unspecified: Secondary | ICD-10-CM | POA: Insufficient documentation

## 2022-07-06 DIAGNOSIS — Z5181 Encounter for therapeutic drug level monitoring: Secondary | ICD-10-CM | POA: Insufficient documentation

## 2022-07-06 DIAGNOSIS — M545 Low back pain, unspecified: Secondary | ICD-10-CM | POA: Insufficient documentation

## 2022-07-06 DIAGNOSIS — G8929 Other chronic pain: Secondary | ICD-10-CM | POA: Insufficient documentation

## 2022-07-06 DIAGNOSIS — Z79891 Long term (current) use of opiate analgesic: Secondary | ICD-10-CM | POA: Insufficient documentation

## 2022-07-06 MED ORDER — TRAMADOL HCL 50 MG PO TABS: 50.0000 mg | ORAL_TABLET | Freq: Four times a day (QID) | ORAL | 1 refills | Status: DC

## 2022-07-06 MED ORDER — MORPHINE SULFATE ER 30 MG PO TBCR: 30.0000 mg | EXTENDED_RELEASE_TABLET | Freq: Three times a day (TID) | ORAL | 0 refills | Status: DC

## 2022-07-06 NOTE — Progress Notes (Deleted)
   Subjective:    Patient ID: Cindy Pratt, female    DOB: [birth year], 56 y.o.   MRN: [id number]  HPI   .cpr Review of Systems     Objective:   Physical Exam        Assessment & Plan:
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
## 2022-07-08 ENCOUNTER — Ambulatory Visit: Payer: Medicare Other | Admitting: Nurse Practitioner

## 2022-07-19 ENCOUNTER — Ambulatory Visit: Admission: RE | Admit: 2022-07-19 | Payer: Medicare Other | Source: Ambulatory Visit

## 2022-07-24 ENCOUNTER — Ambulatory Visit: Admission: RE | Admit: 2022-07-24 | Payer: Medicare Other | Source: Ambulatory Visit

## 2022-07-30 ENCOUNTER — Ambulatory Visit
Admission: RE | Admit: 2022-07-30 | Discharge: 2022-07-30 | Disposition: A | Discharge status: Home / Self Care | Payer: Medicare Other | Source: Ambulatory Visit | Attending: Internal Medicine | Admitting: Internal Medicine

## 2022-07-30 DIAGNOSIS — R911 Solitary pulmonary nodule: Secondary | ICD-10-CM

## 2022-08-01 ENCOUNTER — Ambulatory Visit
Admission: RE | Admit: 2022-08-01 | Discharge: 2022-08-01 | Disposition: A | Discharge status: Home / Self Care | Payer: Medicare Other | Source: Ambulatory Visit | Attending: Orthopedic Surgery | Admitting: Orthopedic Surgery

## 2022-08-01 DIAGNOSIS — S32010A Wedge compression fracture of first lumbar vertebra, initial encounter for closed fracture: Secondary | ICD-10-CM | POA: Insufficient documentation

## 2022-08-03 ENCOUNTER — Encounter: Payer: Self-pay | Admitting: Registered Nurse

## 2022-08-03 ENCOUNTER — Encounter: Payer: Medicare Other | Attending: Registered Nurse | Admitting: Registered Nurse

## 2022-08-03 VITALS — BP 116/77 | HR 72 | Ht 64.0 in | Wt 128.0 lb

## 2022-08-03 DIAGNOSIS — M961 Postlaminectomy syndrome, not elsewhere classified: Secondary | ICD-10-CM | POA: Diagnosis present

## 2022-08-03 DIAGNOSIS — M792 Neuralgia and neuritis, unspecified: Secondary | ICD-10-CM | POA: Diagnosis present

## 2022-08-03 DIAGNOSIS — M5416 Radiculopathy, lumbar region: Secondary | ICD-10-CM | POA: Insufficient documentation

## 2022-08-03 DIAGNOSIS — G894 Chronic pain syndrome: Secondary | ICD-10-CM | POA: Insufficient documentation

## 2022-08-03 DIAGNOSIS — G5793 Unspecified mononeuropathy of bilateral lower limbs: Secondary | ICD-10-CM | POA: Insufficient documentation

## 2022-08-03 DIAGNOSIS — Z79891 Long term (current) use of opiate analgesic: Secondary | ICD-10-CM | POA: Insufficient documentation

## 2022-08-03 DIAGNOSIS — M797 Fibromyalgia: Secondary | ICD-10-CM | POA: Insufficient documentation

## 2022-08-03 DIAGNOSIS — M542 Cervicalgia: Secondary | ICD-10-CM | POA: Diagnosis present

## 2022-08-03 DIAGNOSIS — M5412 Radiculopathy, cervical region: Secondary | ICD-10-CM | POA: Diagnosis present

## 2022-08-03 MED ORDER — MORPHINE SULFATE ER 30 MG PO TBCR: 30.0000 mg | EXTENDED_RELEASE_TABLET | Freq: Three times a day (TID) | ORAL | 0 refills | Status: DC

## 2022-08-03 MED ORDER — TRAMADOL HCL 50 MG PO TABS: 50.0000 mg | ORAL_TABLET | Freq: Four times a day (QID) | ORAL | 1 refills | Status: DC
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
## 2022-08-04 NOTE — Progress Notes (Signed)
Telephone Visit- Progress Note: Referring Physician:  No referring provider defined for this encounter.  Primary Physician:  Mick Sell, MD  This visit was performed via telephone.  Patient location: home Provider location: office  I spent a total of 10 minutes non-face-to-face activities for this visit on the date of this encounter including review of current clinical condition and response to treatment.    Patient has given verbal consent to this telephone visits and we reviewed the limitations of a telephone visit. Patient wishes to proceed.    Chief Complaint:  review lumbar MRI results.   History of Present Illness: Cindy Pratt is a 56 y.o. female that was last seen by me on 07/06/22 for follow up of L1 compression fracture DOI 07/03/22.   Also with history of fusion L5-S1. She has known slip at L4-L5 with DDD with mild left subarticular stenosis on previous lumbar MRI.    She was to continue with TLSO brace. She was to continue on pain medication from pain management.   Phone visit scheduled to review updated lumbar MRI scan.   She has constant LBP that is no better. A few days ago, she also started with constant right leg pain in the front of her leg to her knee. No left leg pain. She is still wearing her brace.   She is seeing PMR for postlaminectomy syndrome, SI joint dysfunction, neck pain, FM, and polyneuropathy. They have her on MS Contin and tramadol.    Exam: No exam done as this was a telephone encounter.     Imaging: Lumbar MRI scan dated 08/01/22:  FINDINGS: Segmentation:  Standard.   Alignment:  Unchanged grade 1 anterolisthesis of L4 on L5.   Vertebrae: Acute superior endplate compression fracture of the L1 vertebral body with 35% vertebral body height loss, progressed from prior (25% height loss on 07/04/2022). Bone marrow edema persists within the vertebral body. No fracture extension into the posterior elements. 2 mm of retropulsion  near the superior endplate. No additional fractures. Prior L5-S1 fusion with solid arthrodesis. No evidence of discitis. No marrow replacing bone lesion.   Conus medullaris and cauda equina: Conus extends to the L1 level. Conus and cauda equina appear normal.   Paraspinal and other soft tissues: Negative.   Disc levels:   T12-L1: Minimal impress on the ventral thecal sac from bony retropulsion at L1. Unremarkable facet joints. No foraminal or canal stenosis.   L1-L2: Unremarkable.   L2-L3: Unremarkable disc. Mild bilateral facet hypertrophy. No foraminal or canal stenosis.   L3-L4: No disc protrusion. Mild bilateral facet arthropathy. No foraminal or canal stenosis.   L4-L5: Anterolisthesis with mild disc uncovering. Moderate left greater than right facet arthropathy. Mild left greater than right subarticular recess stenosis and mild left foraminal stenosis. No canal stenosis.   L5-S1: Fusion and posterior decompression without residual stenosis.   IMPRESSION: 1. Acute superior endplate compression fracture of the L1 vertebral body with 35% vertebral body height loss, progressed from prior (25% height loss on 07/04/2022). Minimal bony retropulsion without canal stenosis. 2. Mild degenerative changes of the lumbar spine greatest at L4-L5 where there is mild left greater than right subarticular recess stenosis and mild left foraminal stenosis. 3. No canal stenosis at any level. 4. Prior L5-S1 fusion with solid arthrodesis.     Electronically Signed   By: Duanne Guess D.O.   On: 08/03/2022 08:32  I have personally reviewed the images and agree with the above interpretation.  Assessment and  Plan: Ms. Escutia is a pleasant 56 y.o. female with L1 compression fracture after feeling pop in her back on 07/03/22. She continues with constant LBP with a few days of right anterior thigh pain.   MRI shows acute superior endplate compression fracture of the L1 vertebral body  with 35% vertebral body height loss, progressed from prior (25% height loss on 07/04/2022). LBP likely from compression fracture.   Treatment options discussed with patient and following plan made:   - She would like to proceed with evaluation for kyphoplasty with IR. Referral done.  - Continue with TLSO brace for now. No bending, twisting, or lifting.  - Continue with current pain medications from outside pain management.   Drake Leach PA-C Neurosurgery

## 2022-08-06 ENCOUNTER — Encounter: Payer: Self-pay | Admitting: Orthopedic Surgery

## 2022-08-06 ENCOUNTER — Ambulatory Visit (INDEPENDENT_AMBULATORY_CARE_PROVIDER_SITE_OTHER): Payer: Medicare Other | Admitting: Orthopedic Surgery

## 2022-08-06 DIAGNOSIS — S32010D Wedge compression fracture of first lumbar vertebra, subsequent encounter for fracture with routine healing: Secondary | ICD-10-CM | POA: Diagnosis not present

## 2022-08-06 DIAGNOSIS — S32010A Wedge compression fracture of first lumbar vertebra, initial encounter for closed fracture: Secondary | ICD-10-CM

## 2022-08-06 NOTE — Telephone Encounter (Signed)
Kennyth Arnold spoke with the patient

## 2022-08-11 ENCOUNTER — Other Ambulatory Visit: Payer: Self-pay | Admitting: Orthopedic Surgery

## 2022-08-11 ENCOUNTER — Ambulatory Visit: Payer: Medicare Other | Admitting: Nurse Practitioner

## 2022-08-11 DIAGNOSIS — S32010A Wedge compression fracture of first lumbar vertebra, initial encounter for closed fracture: Secondary | ICD-10-CM

## 2022-08-17 ENCOUNTER — Ambulatory Visit
Admission: RE | Admit: 2022-08-17 | Discharge: 2022-08-17 | Disposition: A | Discharge status: Home / Self Care | Payer: Medicare Other | Source: Ambulatory Visit | Attending: Orthopedic Surgery | Admitting: Orthopedic Surgery

## 2022-08-17 DIAGNOSIS — S32010A Wedge compression fracture of first lumbar vertebra, initial encounter for closed fracture: Secondary | ICD-10-CM

## 2022-08-17 HISTORY — PX: IR RADIOLOGIST EVAL & MGMT: IMG5224

## 2022-08-17 HISTORY — DX: Age-related osteoporosis without current pathological fracture: M81.0

## 2022-08-17 NOTE — H&P (Signed)
Interventional Radiology - Clinic Visit, Initial H&P    Referring Provider: Geronimo Boot, PA-C  Reason for Visit: L1 compression fracture     History of Present Illness  Cindy Pratt is a 56 y.o. female with a relevant past medical history of osteoporosis and chronic back pain seen today in Interventional Radiology clinic for L1 compression fracture.  Patient presents today for continued back pain related to subacute fracture of the L1 vertebral body.  Patient reports she was diagnosed with osteoporosis many years ago.  She states she was bending over to pick something up approximately 6-7 weeks ago, when she experienced a snapping sensation in her back.  Lumbar spine radiograph on July 04, 2022 demonstrated acute compression fracture of the superior endplate of L1 with 85% height loss.  She was subsequently evaluated by neurosurgery and was provided a TLSO brace which she has been wearing since that appointment.  Despite bracing and prescription pain medication, the patient reports no significant improvement in her pain since the date of injury.  She reports the pain is 8/10.  She takes chronic pain medication for which there is no improvement.  She reports significant disability on the [employer] disability questionnaire with 18/24 positive.    Subsequent imaging with MRI lumbar spine on August 01, 2022 demonstrate persistent marrow edema compatible with a subacute fracture and progressive now 35% height loss.      Additional Past Medical History Past Medical History:  Diagnosis Date   Anxiety    Asthma    Belchings    Bipolar disorder (HCC)    Chronic pain syndrome    in pain clinic   Degenerative joint disease    Depression    Disturbance of skin sensation    Fibromyalgia    GERD (gastroesophageal reflux disease)    History of kidney stones    Hyperlipidemia    Lumbar post-laminectomy syndrome    Lumbosacral neuritis    Osteoarthritis    Osteoporosis    patient  reports was diagnosed many years ago   Other chronic postoperative pain    Sciatica      Surgical History  Past Surgical History:  Procedure Laterality Date   ABDOMINAL HYSTERECTOMY     BACK SURGERY  1997, 2003   CYSTECTOMY     DIAGNOSTIC LAPAROSCOPY     EXTRACORPOREAL SHOCK WAVE LITHOTRIPSY Right 05/04/2018   Procedure: RIGHT EXTRACORPOREAL SHOCK WAVE LITHOTRIPSY (ESWL) WITH MAC;  Surgeon: Kathie Rhodes, MD;  Location: [facility];  Service: Urology;  Laterality: Right;   IR ANGIO INTRA EXTRACRAN SEL COM CAROTID INNOMINATE BILAT MOD SED  06/09/2020   IR ANGIO VERTEBRAL SEL VERTEBRAL BILAT MOD SED  06/09/2020   IR RADIOLOGIST EVAL & MGMT  08/10/2020   IR US GUIDE VASC ACCESS RIGHT  06/09/2020   WRIST ARTHROPLASTY Left    WRIST ARTHROSCOPY WITH DEBRIDEMENT Right 11/13/2015   Procedure: RIGHT WRIST ARTHROSCOPY WITH DEBRIDEMENT;  Surgeon: Leanora Cover, MD;  Location: [place];  Service: Orthopedics;  Laterality: Right;     Medications  I have reviewed the current medication list. Refer to chart for details. Current Outpatient Medications  Medication Instructions   albuterol (VENTOLIN HFA) 108 (90 Base) MCG/ACT inhaler 2 puffs, Inhalation, Every 6 hours PRN   ALPHA-LIPOIC ACID PO Oral   Boswellia Serrata (BOSWELLIA PO) Oral   buPROPion (WELLBUTRIN SR) 150 MG 12 hr tablet TAKE 1 TABLET BY MOUTH TWICE A DAY (NEEDAPPT FOR REFILLS)   COCONUT OIL PO Oral  COLLAGEN PO Oral   Ginger, Zingiber officinalis, (GINGER PO) Oral   lamoTRIgine (LAMICTAL) 100 MG tablet TAKE 1/2 A TABLET BY MOUTH 2 TIMES DAILY(NEED APPT FOR REFILLS)   lamoTRIgine (LAMICTAL) 25 MG tablet TAKE 1 TABLET BY MOUTH ONCE A DAY. TAKE WITH 100 MG FOR TOTAL DOSE OF 125 MG. (NEEED APPT FOR REFILLS)   morphine (MS CONTIN) 30 mg, Oral, Every 8 hours   SELENIUM PO Oral   tiZANidine (ZANAFLEX) 4 mg, Oral, 2 times daily   topiramate (TOPAMAX) 50 mg, Oral, 2 times daily   traMADol (ULTRAM) 50 mg, Oral, 4 times daily,  Please remove previous prescription from profile   Turmeric (QC TUMERIC COMPLEX PO) Oral   Vraylar 1.5 mg, Oral, Daily      Allergies Allergies  Allergen Reactions   Aspirin Shortness Of Breath   Nsaids Shortness Of Breath   Sulfa Antibiotics Hives   Sulfonamide Derivatives Hives   Tolmetin Shortness Of Breath   Cymbalta [Duloxetine Hcl] Other (See Comments)    confusion   Duloxetine Other (See Comments)    confusion   Gabapentin Other (See Comments)    Causes confusion   Other Other (See Comments)    [place]wanghda? Patient is not sure of spelling it is an herb- caused confusion   Pregabalin Other (See Comments)    confusion   Does patient have contrast allergy: No     Physical Exam Current Vitals Temp: 98.2 F (36.8 C) (Temp Source: Oral)  Pulse Rate: 66  Resp: 14  BP: 114/74  SpO2: 98 %        There is no height or weight on file to calculate BMI.  General: Alert and answers questions appropriately.  HEENT: Normocephalic, atraumatic.  Cardiac: Regular rate.  Pulmonary: Normal work of breathing. On room air. Back: TTP over upper lower back    Pertinent Lab Results    Latest Ref Rng & Units 04/07/2022    5:57 PM 04/07/2022   12:41 AM 06/09/2020    7:26 AM  CBC  WBC 4.0 - 10.5 K/uL 10.9  6.3  6.1   Hemoglobin 12.0 - 15.0 g/dL 13.1  13.1  12.1   Hematocrit 36.0 - 46.0 % 40.0  40.5  38.1   Platelets 150 - 400 K/uL 280  297  265       Latest Ref Rng & Units 04/07/2022    5:57 PM 04/07/2022   12:32 AM 06/17/2021   12:32 PM  CMP  Glucose 70 - 99 mg/dL 101  110  85   BUN 6 - 20 mg/dL _0 Creatinine 0.44 - 1.00 mg/dL 0.95  1.09  1.10   Sodium 135 - 145 mmol/L 142  141  142   Potassium 3.5 - 5.1 mmol/L 3.3  3.6  4.2   Chloride 98 - 111 mmol/L 110  110  108   CO2 22 - 32 mmol/L _1 Calcium 8.9 - 10.3 mg/dL 9.5  9.5  9.5   Total Protein 6.5 - 8.1 g/dL 7.1  6.7    Total Bilirubin 0.3 - 1.2 mg/dL 0.5  0.5    Alkaline Phos 38 - 126 U/L 84  82    AST  15 - 41 U/L 15  18    ALT 0 - 44 U/L 17  20        Relevant and/or Recent Imaging: As reviewed in the HPI     Assessment &  Plan:   Patient has suffered subacute osteoporotic fracture of the L1 vertebra. She has delayed healing and continued symptomatic back pain despite conservative therapy including TLSO brace.   History and exam have demonstrated the following:  Acute/Subacute fracture by imaging dated 08/01/2022, Pain on exam concordant with level of fracture, Failure of conservative therapy and pain refractory to narcotic pain mediation, and Significant disability on the [place] with 18/24 positive symptoms, reflecting significant impact/impairment of (ADLs)   ICD-10-CM Codes that Support Medical Necessity ([email or web address])  M80.08XA    Age-related osteoporosis with current pathological fracture, vertebra(e), initial encounter for fracture   Plan:  L1 vertebral body augmentation with balloon kyphoplasty  Post-procedure disposition: outpatient DRI-A  Medication holds: none   The patient has suffered a fracture of the L1 vertebral body. It is recommended that patients aged 43 years or older be evaluated for possible testing or treatment of osteoporosis. A copy of this consult report is sent to the patient's referring physician.  Advanced Care Plan: The patient did not want to provide an [place] at the time of this visit     Total time spent on today's visit was over 60 Minutes, including both face-to-face time and non face-to-face time, personally spent on review of chart (including labs and relevant imaging), discussing further workup and treatment options, referral to specialist if needed, reviewing outside records if pertinent, answering patient questions, and coordinating care regarding L1 fracture as well as management strategy.       Albin Felling, MD  Vascular and  Interventional Radiology 08/17/2022 4:31 PM

## 2022-08-18 ENCOUNTER — Other Ambulatory Visit: Payer: Self-pay | Admitting: Orthopedic Surgery

## 2022-08-18 DIAGNOSIS — M8000XA Age-related osteoporosis with current pathological fracture, unspecified site, initial encounter for fracture: Secondary | ICD-10-CM

## 2022-08-31 ENCOUNTER — Ambulatory Visit: Payer: Medicare Other | Admitting: Orthopedic Surgery

## 2022-08-31 ENCOUNTER — Other Ambulatory Visit: Payer: Medicare Other

## 2022-09-02 ENCOUNTER — Ambulatory Visit
Admission: RE | Admit: 2022-09-02 | Discharge: 2022-09-02 | Disposition: A | Discharge status: Home / Self Care | Payer: Medicare Other | Source: Ambulatory Visit | Attending: Orthopedic Surgery | Admitting: Orthopedic Surgery

## 2022-09-02 DIAGNOSIS — M8000XA Age-related osteoporosis with current pathological fracture, unspecified site, initial encounter for fracture: Secondary | ICD-10-CM

## 2022-09-02 HISTORY — PX: IR KYPHO LUMBAR INC FX REDUCE BONE BX UNI/BIL CANNULATION INC/IMAGING: IMG5519

## 2022-09-02 MED ORDER — FENTANYL CITRATE PF 50 MCG/ML IJ SOSY
25.0000 ug | PREFILLED_SYRINGE | INTRAMUSCULAR | Status: DC | PRN
  Administered 2022-09-02 (×3): 25 ug via INTRAVENOUS

## 2022-09-02 MED ORDER — MIDAZOLAM HCL 2 MG/2ML IJ SOLN
1.0000 mg | INTRAMUSCULAR | Status: DC | PRN
  Administered 2022-09-02: 1 mg via INTRAVENOUS
  Administered 2022-09-02: 0.5 mg via INTRAVENOUS
  Administered 2022-09-02 (×2): 1 mg via INTRAVENOUS

## 2022-09-02 MED ORDER — SODIUM CHLORIDE 0.9 % IV SOLN: INTRAVENOUS | Status: DC

## 2022-09-02 MED ORDER — ACETAMINOPHEN 10 MG/ML IV SOLN
1000.0000 mg | Freq: Once | INTRAVENOUS | Status: AC
  Administered 2022-09-02: 1000 mg via INTRAVENOUS

## 2022-09-02 MED ORDER — CEFAZOLIN SODIUM-DEXTROSE 2-4 GM/100ML-% IV SOLN
2.0000 g | INTRAVENOUS | Status: AC
  Administered 2022-09-02: 2 g via INTRAVENOUS

## 2022-09-02 NOTE — Progress Notes (Signed)
Pt back in nursing recovery area. Pt still drowsy from procedure but will wake up when spoken to. Pt follows commands, talks in complete sentences and has no complaints at this time. Pt will remain in nursing station until discharge.  ?

## 2022-09-02 NOTE — Discharge Instructions (Signed)
Kyphoplasty Post Procedure Discharge Instructions  May resume a regular diet and any medications that you routinely take (including pain medications). However, if you are taking Aspirin or an anticoagulant/blood thinner you will be told when you can resume taking these by the healthcare provider. No driving day of procedure. The day of your procedure take it easy. You may use an ice pack as needed to injection sites on back.  Ice to back 30 minutes on and 30 minutes off, as needed. May remove bandaids tomorrow after taking a shower. Replace daily with a clean bandaid until healed.  Do not lift anything heavier than a milk jug for 1-2 weeks or determined by your physician.  Follow up with your physician in 2 weeks.    Please contact our office at [phone] for the following symptoms or if you have any questions:  Fever greater than 100 degrees Increased swelling, pain, or redness at injection site. Increased back and/or leg pain New numbness or change in symptoms from before the procedure.    Thank you for visiting [facility]. 

## 2022-09-03 ENCOUNTER — Encounter: Payer: 59 | Admitting: Registered Nurse

## 2022-09-06 ENCOUNTER — Ambulatory Visit: Payer: Medicare Other | Admitting: Registered Nurse

## 2022-09-07 ENCOUNTER — Telehealth: Payer: Self-pay | Admitting: Registered Nurse

## 2022-09-07 ENCOUNTER — Encounter: Payer: 59 | Admitting: Registered Nurse

## 2022-09-07 MED ORDER — TRAMADOL HCL 50 MG PO TABS: 50.0000 mg | ORAL_TABLET | Freq: Four times a day (QID) | ORAL | 1 refills | Status: DC

## 2022-09-07 MED ORDER — MORPHINE SULFATE ER 30 MG PO TBCR: 30.0000 mg | EXTENDED_RELEASE_TABLET | Freq: Three times a day (TID) | ORAL | 0 refills | Status: DC
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
## 2022-09-07 NOTE — Telephone Encounter (Signed)
Patient unable to come today due to weather. Please send her medications in. She is scheduled for 09/23/22

## 2022-09-07 NOTE — Telephone Encounter (Signed)
PMP was Reviewed.  MS Contin and Tramadol e-scribed today.  [name] appointment was re-scheduled due to weather

## 2022-09-09 NOTE — Progress Notes (Signed)
Left VM on phone

## 2022-09-23 ENCOUNTER — Encounter: Payer: 59 | Attending: Registered Nurse | Admitting: Registered Nurse

## 2022-09-23 VITALS — BP 112/76 | HR 72 | Ht 64.0 in | Wt 121.0 lb

## 2022-09-23 DIAGNOSIS — Z5181 Encounter for therapeutic drug level monitoring: Secondary | ICD-10-CM | POA: Insufficient documentation

## 2022-09-23 DIAGNOSIS — M797 Fibromyalgia: Secondary | ICD-10-CM | POA: Insufficient documentation

## 2022-09-23 DIAGNOSIS — G5793 Unspecified mononeuropathy of bilateral lower limbs: Secondary | ICD-10-CM | POA: Diagnosis present

## 2022-09-23 DIAGNOSIS — M961 Postlaminectomy syndrome, not elsewhere classified: Secondary | ICD-10-CM | POA: Insufficient documentation

## 2022-09-23 DIAGNOSIS — G894 Chronic pain syndrome: Secondary | ICD-10-CM | POA: Diagnosis present

## 2022-09-23 DIAGNOSIS — M542 Cervicalgia: Secondary | ICD-10-CM | POA: Insufficient documentation

## 2022-09-23 DIAGNOSIS — M5416 Radiculopathy, lumbar region: Secondary | ICD-10-CM | POA: Insufficient documentation

## 2022-09-23 DIAGNOSIS — M792 Neuralgia and neuritis, unspecified: Secondary | ICD-10-CM | POA: Diagnosis present

## 2022-09-23 DIAGNOSIS — Z79891 Long term (current) use of opiate analgesic: Secondary | ICD-10-CM | POA: Diagnosis present

## 2022-09-23 MED ORDER — MORPHINE SULFATE ER 30 MG PO TBCR: 30.0000 mg | EXTENDED_RELEASE_TABLET | Freq: Three times a day (TID) | ORAL | 0 refills | Status: DC
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
## 2022-09-27 ENCOUNTER — Encounter: Payer: Self-pay | Admitting: Registered Nurse

## 2022-10-28 ENCOUNTER — Encounter: Payer: 59 | Admitting: Registered Nurse
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
## 2022-11-02 ENCOUNTER — Encounter: Payer: Self-pay | Admitting: Registered Nurse

## 2022-11-02 ENCOUNTER — Encounter: Payer: 59 | Attending: Registered Nurse | Admitting: Registered Nurse

## 2022-11-02 VITALS — BP 111/72 | HR 67 | Ht 64.0 in | Wt 118.0 lb

## 2022-11-02 DIAGNOSIS — G5793 Unspecified mononeuropathy of bilateral lower limbs: Secondary | ICD-10-CM | POA: Diagnosis present

## 2022-11-02 DIAGNOSIS — M545 Low back pain, unspecified: Secondary | ICD-10-CM | POA: Diagnosis not present

## 2022-11-02 DIAGNOSIS — M5412 Radiculopathy, cervical region: Secondary | ICD-10-CM | POA: Diagnosis present

## 2022-11-02 DIAGNOSIS — M542 Cervicalgia: Secondary | ICD-10-CM

## 2022-11-02 DIAGNOSIS — M546 Pain in thoracic spine: Secondary | ICD-10-CM | POA: Diagnosis present

## 2022-11-02 DIAGNOSIS — Z5181 Encounter for therapeutic drug level monitoring: Secondary | ICD-10-CM

## 2022-11-02 DIAGNOSIS — G894 Chronic pain syndrome: Secondary | ICD-10-CM | POA: Diagnosis not present

## 2022-11-02 DIAGNOSIS — Z79891 Long term (current) use of opiate analgesic: Secondary | ICD-10-CM

## 2022-11-02 DIAGNOSIS — G8929 Other chronic pain: Secondary | ICD-10-CM | POA: Diagnosis present

## 2022-11-02 DIAGNOSIS — M792 Neuralgia and neuritis, unspecified: Secondary | ICD-10-CM | POA: Diagnosis present

## 2022-11-02 DIAGNOSIS — M797 Fibromyalgia: Secondary | ICD-10-CM | POA: Diagnosis present

## 2022-11-02 MED ORDER — TRAMADOL HCL 50 MG PO TABS: 50.0000 mg | ORAL_TABLET | Freq: Four times a day (QID) | ORAL | 1 refills | Status: DC

## 2022-11-02 MED ORDER — MORPHINE SULFATE ER 30 MG PO TBCR: 30.0000 mg | EXTENDED_RELEASE_TABLET | Freq: Three times a day (TID) | ORAL | 0 refills | Status: DC

## 2022-11-02 MED ORDER — TIZANIDINE HCL 4 MG PO TABS: 4.0000 mg | ORAL_TABLET | Freq: Two times a day (BID) | ORAL | 5 refills | Status: DC

## 2022-11-02 MED ORDER — TOPIRAMATE 50 MG PO TABS: 50.0000 mg | ORAL_TABLET | Freq: Two times a day (BID) | ORAL | 3 refills | Status: DC
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
## 2022-11-05 LAB — TOXASSURE SELECT,+ANTIDEPR,UR

## 2022-11-08 ENCOUNTER — Ambulatory Visit: Payer: 59 | Admitting: Registered Nurse

## 2022-11-17 ENCOUNTER — Telehealth: Payer: Self-pay | Admitting: *Deleted

## 2022-11-17 NOTE — Telephone Encounter (Signed)
Urine drug screen for this encounter is consistent for prescribed medication 

## 2022-11-30 ENCOUNTER — Encounter: Payer: 59 | Attending: Registered Nurse | Admitting: Physical Medicine & Rehabilitation

## 2022-11-30 ENCOUNTER — Other Ambulatory Visit: Payer: Self-pay | Admitting: Registered Nurse

## 2022-11-30 ENCOUNTER — Encounter: Payer: Self-pay | Admitting: Physical Medicine & Rehabilitation

## 2022-11-30 VITALS — BP 119/78 | HR 58 | Ht 64.0 in | Wt 118.6 lb

## 2022-11-30 DIAGNOSIS — M47816 Spondylosis without myelopathy or radiculopathy, lumbar region: Secondary | ICD-10-CM | POA: Insufficient documentation

## 2022-11-30 MED ORDER — MORPHINE SULFATE ER 30 MG PO TBCR: 30.0000 mg | EXTENDED_RELEASE_TABLET | Freq: Three times a day (TID) | ORAL | 0 refills | Status: DC

## 2022-11-30 NOTE — Patient Instructions (Signed)
Back Exercises These exercises help to make your trunk and back strong. They also help to keep the lower back flexible. Doing these exercises can help to prevent or lessen pain in your lower back. If you have back pain, try to do these exercises 2-3 times each day or as told by your doctor. As you get better, do the exercises once each day. Repeat the exercises more often as told by your doctor. To stop back pain from coming back, do the exercises once each day, or as told by your doctor. Do exercises exactly as told by your doctor. Stop right away if you feel sudden pain or your pain gets worse. Exercises Single knee to chest Do these steps 3-5 times in a row for each leg: Lie on your back on a firm bed or the floor with your legs stretched out. Bring one knee to your chest. Grab your knee or thigh with both hands and hold it in place. Pull on your knee until you feel a gentle stretch in your lower back or butt. Keep doing the stretch for 10-30 seconds. Slowly let go of your leg and straighten it. Pelvic tilt Do these steps 5-10 times in a row: Lie on your back on a firm bed or the floor with your legs stretched out. Bend your knees so they point up to the ceiling. Your feet should be flat on the floor. Tighten your lower belly (abdomen) muscles to press your lower back against the floor. This will make your tailbone point up to the ceiling instead of pointing down to your feet or the floor. Stay in this position for 5-10 seconds while you gently tighten your muscles and breathe evenly. Cat-cow Do these steps until your lower back bends more easily: Get on your hands and knees on a firm bed or the floor. Keep your hands under your shoulders, and keep your knees under your hips. You may put padding under your knees. Let your head hang down toward your chest. Tighten (contract) the muscles in your belly. Point your tailbone toward the floor so your lower back becomes rounded like the back of a  cat. Stay in this position for 5 seconds. Slowly lift your head. Let the muscles of your belly relax. Point your tailbone up toward the ceiling so your back forms a sagging arch like the back of a cow. Stay in this position for 5 seconds.  Press-ups Do these steps 5-10 times in a row: Lie on your belly (face-down) on a firm bed or the floor. Place your hands near your head, about shoulder-width apart. While you keep your back relaxed and keep your hips on the floor, slowly straighten your arms to raise the top half of your body and lift your shoulders. Do not use your back muscles. You may change where you place your hands to make yourself more comfortable. Stay in this position for 5 seconds. Keep your back relaxed. Slowly return to lying flat on the floor.  Bridges Do these steps 10 times in a row: Lie on your back on a firm bed or the floor. Bend your knees so they point up to the ceiling. Your feet should be flat on the floor. Your arms should be flat at your sides, next to your body. Tighten your butt muscles and lift your butt off the floor until your waist is almost as high as your knees. If you do not feel the muscles working in your butt and the back of   your thighs, slide your feet 1-2 inches (2.5-5 cm) farther away from your butt. Stay in this position for 3-5 seconds. Slowly lower your butt to the floor, and let your butt muscles relax. If this exercise is too easy, try doing it with your arms crossed over your chest. Belly crunches Do these steps 5-10 times in a row: Lie on your back on a firm bed or the floor with your legs stretched out. Bend your knees so they point up to the ceiling. Your feet should be flat on the floor. Cross your arms over your chest. Tip your chin a little bit toward your chest, but do not bend your neck. Tighten your belly muscles and slowly raise your chest just enough to lift your shoulder blades a tiny bit off the floor. Avoid raising your body  higher than that because it can put too much stress on your lower back. Slowly lower your chest and your head to the floor. Back lifts Do these steps 5-10 times in a row: Lie on your belly (face-down) with your arms at your sides, and rest your forehead on the floor. Tighten the muscles in your legs and your butt. Slowly lift your chest off the floor while you keep your hips on the floor. Keep the back of your head in line with the curve in your back. Look at the floor while you do this. Stay in this position for 3-5 seconds. Slowly lower your chest and your face to the floor. Contact a doctor if: Your back pain gets a lot worse when you do an exercise. Your back pain does not get better within 2 hours after you exercise. If you have any of these problems, stop doing the exercises. Do not do them again unless your doctor says it is okay. Get help right away if: You have sudden, very bad back pain. If this happens, stop doing the exercises. Do not do them again unless your doctor says it is okay. This information is not intended to replace advice given to you by your health care provider. Make sure you discuss any questions you have with your health care provider. Document Revised: 10/29/2020 Document Reviewed: 10/29/2020 [employer] Patient Education  2023 [employer]  
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
## 2022-12-27 ENCOUNTER — Encounter: Payer: Self-pay | Admitting: Physical Medicine & Rehabilitation

## 2022-12-30 ENCOUNTER — Telehealth: Payer: Self-pay | Admitting: Registered Nurse

## 2022-12-30 MED ORDER — MORPHINE SULFATE ER 30 MG PO TBCR: 30.0000 mg | EXTENDED_RELEASE_TABLET | Freq: Three times a day (TID) | ORAL | 0 refills | Status: DC

## 2022-12-30 NOTE — Telephone Encounter (Signed)
Patient will be out of her MS Contin before her injection.  She will run out tomorrow.  Please call patient.

## 2022-12-30 NOTE — Telephone Encounter (Signed)
PMP was Reviewed.  Morphine e-scribed to pharmacy.  Call Placed to Ms. Chadderdon, she verbalizes understanding.

## 2023-01-04 ENCOUNTER — Encounter: Payer: Self-pay | Admitting: Physical Medicine & Rehabilitation

## 2023-01-04 ENCOUNTER — Encounter: Payer: 59 | Attending: Registered Nurse | Admitting: Physical Medicine & Rehabilitation

## 2023-01-04 VITALS — Ht 64.0 in | Wt 115.0 lb

## 2023-01-04 DIAGNOSIS — M47816 Spondylosis without myelopathy or radiculopathy, lumbar region: Secondary | ICD-10-CM | POA: Insufficient documentation

## 2023-01-04 DIAGNOSIS — M546 Pain in thoracic spine: Secondary | ICD-10-CM | POA: Insufficient documentation

## 2023-01-04 DIAGNOSIS — M792 Neuralgia and neuritis, unspecified: Secondary | ICD-10-CM | POA: Insufficient documentation

## 2023-01-04 DIAGNOSIS — Z5181 Encounter for therapeutic drug level monitoring: Secondary | ICD-10-CM | POA: Diagnosis present

## 2023-01-04 DIAGNOSIS — G5793 Unspecified mononeuropathy of bilateral lower limbs: Secondary | ICD-10-CM | POA: Diagnosis present

## 2023-01-04 DIAGNOSIS — G894 Chronic pain syndrome: Secondary | ICD-10-CM | POA: Insufficient documentation

## 2023-01-04 DIAGNOSIS — M542 Cervicalgia: Secondary | ICD-10-CM | POA: Diagnosis present

## 2023-01-04 DIAGNOSIS — M2550 Pain in unspecified joint: Secondary | ICD-10-CM | POA: Diagnosis present

## 2023-01-04 DIAGNOSIS — G8929 Other chronic pain: Secondary | ICD-10-CM | POA: Insufficient documentation

## 2023-01-04 DIAGNOSIS — Z79891 Long term (current) use of opiate analgesic: Secondary | ICD-10-CM | POA: Insufficient documentation

## 2023-01-04 DIAGNOSIS — M797 Fibromyalgia: Secondary | ICD-10-CM | POA: Insufficient documentation

## 2023-01-04 MED ORDER — LIDOCAINE HCL 1 % IJ SOLN
5.0000 mL | Freq: Once | INTRAMUSCULAR | Status: AC
Start: 2023-01-04 — End: 2023-01-04
  Administered 2023-01-04: 5 mL

## 2023-01-04 MED ORDER — BETAMETHASONE SOD PHOS & ACET 6 (3-3) MG/ML IJ SUSP
12.0000 mg | Freq: Once | INTRAMUSCULAR | Status: AC
Start: 2023-01-04 — End: 2023-01-04
  Administered 2023-01-04: 12 mg via INTRAMUSCULAR

## 2023-01-04 MED ORDER — IOHEXOL 180 MG/ML  SOLN
3.0000 mL | Freq: Once | INTRAMUSCULAR | Status: AC
  Administered 2023-01-04: 3 mL via INTRAVENOUS

## 2023-01-04 MED ORDER — LIDOCAINE HCL (PF) 2 % IJ SOLN
2.0000 mL | Freq: Once | INTRAMUSCULAR | Status: AC
Start: 2023-01-04 — End: 2023-01-04
  Administered 2023-01-04: 2 mL

## 2023-01-04 NOTE — Progress Notes (Signed)
Bilateral sacroiliac injections under fluoroscopic guidance  Indication: Low back and buttocks pain not relieved by medication management and other conservative care.  Informed consent was obtained after describing risks and benefits of the procedure with the patient, this includes bleeding, bruising, infection, paralysis and medication side effects. The patient wishes to proceed and has given written consent. The patient was placed in a prone position. The lumbar and sacral area was marked and prepped with Betadine. A 25-gauge 1-1/2 inch needle was inserted into the skin and subcutaneous tissue and 1 mL of 1% lidocaine was injected into each side. Then a 25-gauge 3 inch spinal needle was inserted under fluoroscopic guidance into the left sacroiliac joint. AP and lateral images were utilized. Omnipaque 180x0.5 mL under live fluoroscopy demonstrated no intravascular uptake. Then a solution containing one ML of 6 mg per mL Celestone in 2 ML of 2% lidocaine MPF was injected x1.5 mL. This same procedure was repeated on the right side using the same needle, injectate, and technique. Patient tolerated the procedure well. Post procedure instructions were given. Please see post procedure form.  Meds: Celestone 6mg Omnipaque 1ml Lidocaine 1% 2ml Lidocaine 2% PF 2 ml  

## 2023-01-04 NOTE — Progress Notes (Signed)
  PROCEDURE RECORD [facility]   Name: Cindy Pratt DOB:1966/04/26 MRN: [id number]  Date:01/04/2023  Physician: Claudette Laws, MD    Nurse/CMA: Aleiyah Halpin RMA  Allergies:  Allergies  Allergen Reactions   Aspirin Shortness Of Breath   Nsaids Shortness Of Breath   Sulfa Antibiotics Hives   Sulfonamide Derivatives Hives   Tolmetin Shortness Of Breath   Cymbalta [Duloxetine Hcl] Other (See Comments)    confusion   Duloxetine Other (See Comments)    confusion  confusion, confusion, confusion   Gabapentin Other (See Comments)    Causes confusion  Causes confusion, Causes confusion   Other Other (See Comments)    Aswanghda? Patient is not sure of spelling it is an herb- caused confusion   Pregabalin Other (See Comments)    confusion  confusion, REACTION: confusion, REACTION: confusion    Consent Signed: No.  Is patient diabetic? No.  CBG today? .  Pregnant: No. LMP: No LMP recorded. Patient has had a hysterectomy. (age 29-55)  Anticoagulants: no Anti-inflammatory: no Antibiotics: no  Procedure: B/L Sacroiliac Injection   Position: Prone Start Time: 3:46  End Time: 3:53  Fluoro Time: 31  RN/CMA Naiyana Barbian RMA Hriday Stai RMA    Time 2:30PM 3:56    BP 119/78 109 70    Pulse 69 65    Respirations 16 16    O2 Sat 100 98    S/S 6 6    Pain Level 7/10 6/10     D/C home with Daughter in law , patient A & O X 3, D/C instructions reviewed, and sits independently.

## 2023-01-04 NOTE — Patient Instructions (Signed)
Sacroiliac injection was performed today. A combination of numbing medicine (lidocaine) plus a cortisone medicine (betamethasone) was injected. The injection was done under x-ray guidance. This procedure has been performed to help reduce low back and buttocks pain as well as potentially hip pain. The duration of this injection is variable lasting from hours to  Months. It may repeated if needed. 

## 2023-01-26 ENCOUNTER — Encounter: Payer: 59 | Admitting: Registered Nurse

## 2023-01-27 ENCOUNTER — Encounter: Payer: 59 | Admitting: Registered Nurse

## 2023-01-28 ENCOUNTER — Encounter (HOSPITAL_BASED_OUTPATIENT_CLINIC_OR_DEPARTMENT_OTHER): Payer: 59 | Admitting: Registered Nurse

## 2023-01-28 ENCOUNTER — Encounter: Payer: Self-pay | Admitting: Registered Nurse

## 2023-01-28 VITALS — BP 111/75 | HR 81 | Ht 64.0 in | Wt 116.0 lb

## 2023-01-28 DIAGNOSIS — M542 Cervicalgia: Secondary | ICD-10-CM

## 2023-01-28 DIAGNOSIS — M797 Fibromyalgia: Secondary | ICD-10-CM

## 2023-01-28 DIAGNOSIS — M546 Pain in thoracic spine: Secondary | ICD-10-CM | POA: Diagnosis not present

## 2023-01-28 DIAGNOSIS — G894 Chronic pain syndrome: Secondary | ICD-10-CM

## 2023-01-28 DIAGNOSIS — G8929 Other chronic pain: Secondary | ICD-10-CM

## 2023-01-28 DIAGNOSIS — M2550 Pain in unspecified joint: Secondary | ICD-10-CM

## 2023-01-28 DIAGNOSIS — M47816 Spondylosis without myelopathy or radiculopathy, lumbar region: Secondary | ICD-10-CM

## 2023-01-28 DIAGNOSIS — Z5181 Encounter for therapeutic drug level monitoring: Secondary | ICD-10-CM

## 2023-01-28 DIAGNOSIS — M792 Neuralgia and neuritis, unspecified: Secondary | ICD-10-CM

## 2023-01-28 DIAGNOSIS — G5793 Unspecified mononeuropathy of bilateral lower limbs: Secondary | ICD-10-CM

## 2023-01-28 DIAGNOSIS — Z79891 Long term (current) use of opiate analgesic: Secondary | ICD-10-CM

## 2023-01-28 MED ORDER — TRAMADOL HCL 50 MG PO TABS: 50.0000 mg | ORAL_TABLET | Freq: Four times a day (QID) | ORAL | 1 refills | Status: DC

## 2023-01-28 MED ORDER — MORPHINE SULFATE ER 30 MG PO TBCR: 30.0000 mg | EXTENDED_RELEASE_TABLET | Freq: Three times a day (TID) | ORAL | 0 refills | Status: DC
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
## 2023-02-02 ENCOUNTER — Encounter (HOSPITAL_COMMUNITY): Payer: Self-pay | Admitting: Emergency Medicine

## 2023-02-02 ENCOUNTER — Emergency Department (HOSPITAL_COMMUNITY): Payer: 59

## 2023-02-02 ENCOUNTER — Emergency Department (HOSPITAL_COMMUNITY)
Admission: EM | Admit: 2023-02-02 | Discharge: 2023-02-02 | Disposition: A | Discharge status: Home / Self Care | Payer: 59 | Attending: Emergency Medicine | Admitting: Emergency Medicine

## 2023-02-02 ENCOUNTER — Other Ambulatory Visit: Payer: Self-pay

## 2023-02-02 DIAGNOSIS — M5432 Sciatica, left side: Secondary | ICD-10-CM

## 2023-02-02 DIAGNOSIS — M5442 Lumbago with sciatica, left side: Secondary | ICD-10-CM | POA: Insufficient documentation

## 2023-02-02 DIAGNOSIS — M545 Low back pain, unspecified: Secondary | ICD-10-CM | POA: Diagnosis present

## 2023-02-02 LAB — URINALYSIS, ROUTINE W REFLEX MICROSCOPIC
Bacteria, UA: NONE SEEN
Bilirubin Urine: NEGATIVE
Glucose, UA: NEGATIVE mg/dL
Hgb urine dipstick: NEGATIVE
Ketones, ur: NEGATIVE mg/dL
Nitrite: NEGATIVE
Protein, ur: NEGATIVE mg/dL
Specific Gravity, Urine: 1.017 (ref 1.005–1.030)
pH: 6 (ref 5.0–8.0)

## 2023-02-02 MED ORDER — DEXAMETHASONE SODIUM PHOSPHATE 10 MG/ML IJ SOLN
10.0000 mg | Freq: Once | INTRAMUSCULAR | Status: AC
  Administered 2023-02-02: 10 mg via INTRAMUSCULAR
  Filled 2023-02-02: qty 1

## 2023-02-02 MED ORDER — PREDNISONE 10 MG PO TABS: ORAL_TABLET | ORAL | 0 refills | Status: AC

## 2023-02-02 NOTE — ED Provider Notes (Signed)
[place] EMERGENCY DEPARTMENT AT [facility] Provider Note   CSN: [id number] Arrival date & time: 02/02/23  1052     History  Chief Complaint  Patient presents with   Back Pain    Cindy Pratt is a 57 y.o. female with a history including fibromyalgia associated with chronic pain, also has a history of lumbosacral neuritis, surgical history including lumbar laminectomy.  Was also diagnosed with osteoporosis and had vertebroplasty for a compression fracture of L1 January 2024 presenting for evaluation of exacerbation of her low back pain.  She describes sharp pain in her lower back which originates around the area of her prior lumbar fusion and radiates into her left anterior thigh ending at her knee.  She denies any new injury or falls, she has been perhaps little more active than normal as she is currently moving but has been careful not to pick up any heavy boxes.  She denies any new weakness or numbness in her legs, she does have some chronic numbness in her left foot at baseline.  No fevers or chills, she does endorse increased urinary frequency but no dysuria.  Denies hematuria.  No fevers or chills, no urinary or fecal incontinence or retention.  She does have a history of kidney stones, denies flank pain.  Pain is worsened with movement and better at rest.  She is on MS Contin, tramadol for pain, additionally takes Topamax and Zanaflex for muscle spasm relief.  She is under the care of pain specialist in [place].  The history is provided by the patient.       Home Medications Prior to Admission medications   Medication Sig Start Date End Date Taking? Authorizing Provider  predniSONE (DELTASONE) 10 MG tablet 6, 5, 4, 3, 2 then 1 tablet by mouth daily for 6 days total. 02/02/23  Yes Helmuth Recupero, Raynelle Fanning, PA-C  albuterol (VENTOLIN HFA) 108 (90 Base) MCG/ACT inhaler Inhale 2 puffs into the lungs every 6 (six) hours as needed for wheezing or shortness of breath. 07/04/19   [provider]  ALPHA-LIPOIC ACID PO Take by mouth.    [provider]  Jeanie Cooks Serrata (BOSWELLIA PO) Take by mouth.    [provider]  buPROPion (WELLBUTRIN SR) 150 MG 12 hr tablet TAKE 1 TABLET BY MOUTH TWICE A DAY (NEEDAPPT FOR REFILLS) 05/11/22   Jomarie Longs, MD  COCONUT OIL PO Take by mouth.    [provider]  COLLAGEN PO Take by mouth.    [provider]  esomeprazole (NEXIUM) 40 MG capsule Take by mouth. 09/14/12   [provider]  FLUoxetine HCl 60 MG TABS Take by mouth. 03/28/15   [provider]  Ginger, Zingiber officinalis, (GINGER PO) Take by mouth.    [provider]  lamoTRIgine (LAMICTAL) 100 MG tablet TAKE 1/2 A TABLET BY MOUTH 2 TIMES DAILY(NEED APPT FOR REFILLS) 04/19/22   Jomarie Longs, MD  lamoTRIgine (LAMICTAL) 150 MG tablet Take by mouth. 10/20/15   [provider]  lamoTRIgine (LAMICTAL) 25 MG tablet TAKE 1 TABLET BY MOUTH ONCE A DAY. TAKE WITH 100 MG FOR TOTAL DOSE OF 125 MG. (NEEED APPT FOR REFILLS) 04/20/22   Jomarie Longs, MD  morphine (MS CONTIN) 30 MG 12 hr tablet Take 1 tablet (30 mg total) by mouth every 8 (eight) hours. 01/28/23   Jones Bales, NP  SELENIUM PO Take by mouth.    [provider]  tiZANidine (ZANAFLEX) 4 MG tablet Take 1 tablet (4  mg total) by mouth 2 (two) times daily. 11/02/22   Jones Bales, NP  topiramate (TOPAMAX) 50 MG tablet Take 1 tablet (50 mg total) by mouth 2 (two) times daily. 11/02/22   Jones Bales, NP  traMADol (ULTRAM) 50 MG tablet Take 1 tablet (50 mg total) by mouth 4 (four) times daily. 01/28/23   Jones Bales, NP  Turmeric (QC TUMERIC COMPLEX PO) Take by mouth.    [provider]  VRAYLAR 1.5 MG capsule Take 3 mg by mouth daily. Patient not taking: Reported on 01/28/2023 06/24/22   [provider]  VRAYLAR 3 MG capsule Take 3 mg by mouth daily. 12/29/22   [provider]      Allergies    Aspirin, Nsaids,  Sulfa antibiotics, Sulfonamide derivatives, Tolmetin, Cymbalta [duloxetine hcl], Duloxetine, Gabapentin, Other, and Pregabalin    Review of Systems   Review of Systems  Constitutional:  Negative for fever.  Respiratory:  Negative for shortness of breath.   Cardiovascular:  Negative for chest pain and leg swelling.  Gastrointestinal:  Negative for abdominal distention, abdominal pain and constipation.  Genitourinary:  Positive for frequency. Negative for difficulty urinating, dysuria, flank pain, hematuria and urgency.  Musculoskeletal:  Positive for back pain. Negative for gait problem and joint swelling.  Skin:  Negative for rash.  Neurological:  Positive for numbness. Negative for weakness.       Chronic numbness left foot.  All other systems reviewed and are negative.   Physical Exam Updated Vital Signs BP 117/68 (BP Location: Right Arm)   Pulse 62   Temp 99 F (37.2 C) (Oral)   Resp 18   Ht 5\' 4"  (1.626 m)   Wt 52.6 kg   SpO2 99%   BMI 19.91 kg/m  Physical Exam Vitals and nursing note reviewed.  Constitutional:      Appearance: She is well-developed.  HENT:     Head: Normocephalic.  Eyes:     Conjunctiva/sclera: Conjunctivae normal.  Cardiovascular:     Rate and Rhythm: Normal rate.     Pulses: Normal pulses.     Comments: Pedal pulses normal. Pulmonary:     Effort: Pulmonary effort is normal.  Abdominal:     General: Bowel sounds are normal. There is no distension.     Palpations: Abdomen is soft. There is no mass.  Musculoskeletal:        General: Normal range of motion.     Cervical back: Normal range of motion and neck supple.     Lumbar back: Tenderness present. No swelling, edema or spasms.  Skin:    General: Skin is warm and dry.  Neurological:     General: No focal deficit present.     Mental Status: She is alert and oriented to person, place, and time.     Sensory: No sensory deficit.     Motor: No tremor or atrophy.     Gait: Gait normal.      Comments: No strength deficit noted in hip and knee flexor and extensor muscle groups.  Ankle flexion and extension intact.  Numbness left lateral thigh compared to right.      ED Results / Procedures / Treatments   Labs (all labs ordered are listed, but only abnormal results are displayed) Labs Reviewed  URINALYSIS, ROUTINE W REFLEX MICROSCOPIC - Abnormal; Notable for the following components:      Result Value   Leukocytes,Ua MODERATE (*)    All other components within normal limits  EKG None  Radiology DG Lumbar Spine Complete  Result Date: 02/02/2023 CLINICAL DATA:  Chronic low back pain, increasing pain. History of compression fracture. Pain across lower back since last night with radiation down LEFT leg to knee. EXAM: LUMBAR SPINE - COMPLETE 4+ VIEW COMPARISON:  Plain film lumbar spine dated 07/03/2022 FINDINGS: Interval changes of a vertebroplasty/kyphoplasty at the L1 vertebral body. Posterior intrapedicular fixation hardware at the L5-S1 level appears intact and stable in alignment. L2 through L5 vertebral bodies appear intact and normal in height. Disc spaces appear normal in height. No acute-appearing osseous abnormality. Visualized paravertebral soft tissues are unremarkable. IMPRESSION: 1. Interval vertebroplasty/kyphoplasty treatment a compression fracture at the L1 vertebral body. No evidence of a procedural complicating feature. 2. Posterior interpedicular fixation hardware at the L5-S1 level appears intact and stable in alignment. 3. No acute findings. 4. No evidence of advanced degenerative disc disease at any level. Electronically Signed   By: Bary Richard M.D.   On: 02/02/2023 12:17    Procedures Procedures    Medications Ordered in ED Medications  dexamethasone (DECADRON) injection 10 mg (10 mg Intramuscular Given 02/02/23 1153)    ED Course/ Medical Decision Making/ A&P                             Medical Decision Making Pt with acute on chronic low back pain  with no exam or history findings to suggest cauda equina or other emergent condition.  She is placed on prednisone taper,  advised continuing other meds and close f/u with pcp or pain specialist if not improving with this tx plan.    Amount and/or Complexity of Data Reviewed Labs: ordered.    Details: UA negative Radiology: ordered.    Details: Lumbar film stable, no new compression fx.   Risk Prescription drug management.           Final Clinical Impression(s) / ED Diagnoses Final diagnoses:  Sciatica of left side    Rx / DC Orders ED Discharge Orders          Ordered    predniSONE (DELTASONE) 10 MG tablet        02/02/23 1242              Burgess Amor, PA-C 02/02/23 1303    Eber Hong, MD 02/03/23 [phone]

## 2023-02-02 NOTE — ED Notes (Signed)
Patient transported to X-ray 

## 2023-02-02 NOTE — Discharge Instructions (Signed)
Your x-rays and urinalysis are normal today, I suspect your symptoms are an exacerbation of your chronic low back pain.  I am prescribing you a prednisone taper, start this tomorrow as you have received a steroid injection here today.  Plan follow-up with either your primary MD or your pain specialist if your symptoms persist or worsen.

## 2023-02-02 NOTE — ED Triage Notes (Addendum)
Pt c/o pain across lower back since last night with radiation down left leg to knee. Denies gu sx. Hx of sciatica. Denies any injury. Nad. Pt is ambulatory. Took her morphine and tramadol about an hour ago pta

## 2023-02-02 NOTE — ED Notes (Signed)
Pt back from x-ray.

## 2023-02-14 ENCOUNTER — Encounter: Payer: 59 | Admitting: Registered Nurse

## 2023-02-23 ENCOUNTER — Telehealth: Payer: Self-pay | Admitting: Registered Nurse

## 2023-02-23 MED ORDER — MORPHINE SULFATE ER 30 MG PO TBCR: 30.0000 mg | EXTENDED_RELEASE_TABLET | Freq: Three times a day (TID) | ORAL | 0 refills | Status: DC

## 2023-02-23 NOTE — Telephone Encounter (Signed)
PMP was Reviewed.  MS Contin e-scribed today.  Ms/ Ventola is aware via My-Chart message.

## 2023-02-24 ENCOUNTER — Other Ambulatory Visit: Payer: Self-pay | Admitting: Registered Nurse

## 2023-03-08 DIAGNOSIS — G894 Chronic pain syndrome: Secondary | ICD-10-CM

## 2023-03-08 DIAGNOSIS — M47816 Spondylosis without myelopathy or radiculopathy, lumbar region: Secondary | ICD-10-CM

## 2023-03-29 ENCOUNTER — Encounter: Payer: Self-pay | Admitting: Registered Nurse

## 2023-03-29 ENCOUNTER — Encounter: Payer: 59 | Attending: Registered Nurse | Admitting: Registered Nurse

## 2023-03-29 ENCOUNTER — Telehealth: Payer: Self-pay | Admitting: *Deleted

## 2023-03-29 VITALS — Ht 64.0 in | Wt 116.0 lb

## 2023-03-29 DIAGNOSIS — M47816 Spondylosis without myelopathy or radiculopathy, lumbar region: Secondary | ICD-10-CM | POA: Diagnosis not present

## 2023-03-29 DIAGNOSIS — G8929 Other chronic pain: Secondary | ICD-10-CM | POA: Insufficient documentation

## 2023-03-29 DIAGNOSIS — M797 Fibromyalgia: Secondary | ICD-10-CM

## 2023-03-29 DIAGNOSIS — M25511 Pain in right shoulder: Secondary | ICD-10-CM

## 2023-03-29 DIAGNOSIS — F32A Depression, unspecified: Secondary | ICD-10-CM

## 2023-03-29 DIAGNOSIS — M542 Cervicalgia: Secondary | ICD-10-CM | POA: Diagnosis not present

## 2023-03-29 DIAGNOSIS — M25512 Pain in left shoulder: Secondary | ICD-10-CM

## 2023-03-29 DIAGNOSIS — G894 Chronic pain syndrome: Secondary | ICD-10-CM | POA: Diagnosis not present

## 2023-03-29 DIAGNOSIS — Z5181 Encounter for therapeutic drug level monitoring: Secondary | ICD-10-CM

## 2023-03-29 DIAGNOSIS — Z79891 Long term (current) use of opiate analgesic: Secondary | ICD-10-CM

## 2023-03-29 MED ORDER — TIZANIDINE HCL 4 MG PO TABS: 4.0000 mg | ORAL_TABLET | Freq: Two times a day (BID) | ORAL | 5 refills | Status: AC

## 2023-03-29 MED ORDER — TOPIRAMATE 50 MG PO TABS: 50.0000 mg | ORAL_TABLET | Freq: Two times a day (BID) | ORAL | 1 refills | Status: DC

## 2023-03-29 MED ORDER — MORPHINE SULFATE ER 30 MG PO TBCR: 30.0000 mg | EXTENDED_RELEASE_TABLET | Freq: Three times a day (TID) | ORAL | 0 refills | Status: DC

## 2023-03-29 MED ORDER — TRAMADOL HCL 50 MG PO TABS: 50.0000 mg | ORAL_TABLET | Freq: Four times a day (QID) | ORAL | 1 refills | Status: DC

## 2023-03-29 NOTE — Telephone Encounter (Signed)
Shanda Bumps calling to verify reason scripts were sent to their pharmacy. She needed to verify last in patient visit.. Informed patient had virtual visit today. Gave verbal ok to fill today.
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
## 2023-04-19 ENCOUNTER — Encounter: Payer: 59 | Admitting: Registered Nurse
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
## 2023-04-27 ENCOUNTER — Encounter: Payer: 59 | Attending: Registered Nurse | Admitting: Registered Nurse

## 2023-04-27 ENCOUNTER — Encounter: Payer: Self-pay | Admitting: Registered Nurse

## 2023-04-27 VITALS — Ht 64.0 in | Wt 120.0 lb

## 2023-04-27 DIAGNOSIS — G894 Chronic pain syndrome: Secondary | ICD-10-CM

## 2023-04-27 DIAGNOSIS — M542 Cervicalgia: Secondary | ICD-10-CM | POA: Diagnosis not present

## 2023-04-27 DIAGNOSIS — M797 Fibromyalgia: Secondary | ICD-10-CM

## 2023-04-27 DIAGNOSIS — Z79891 Long term (current) use of opiate analgesic: Secondary | ICD-10-CM

## 2023-04-27 DIAGNOSIS — M792 Neuralgia and neuritis, unspecified: Secondary | ICD-10-CM

## 2023-04-27 DIAGNOSIS — M546 Pain in thoracic spine: Secondary | ICD-10-CM | POA: Diagnosis not present

## 2023-04-27 DIAGNOSIS — M5412 Radiculopathy, cervical region: Secondary | ICD-10-CM

## 2023-04-27 DIAGNOSIS — Z5181 Encounter for therapeutic drug level monitoring: Secondary | ICD-10-CM

## 2023-04-27 DIAGNOSIS — M47816 Spondylosis without myelopathy or radiculopathy, lumbar region: Secondary | ICD-10-CM | POA: Diagnosis not present

## 2023-04-27 DIAGNOSIS — G5793 Unspecified mononeuropathy of bilateral lower limbs: Secondary | ICD-10-CM

## 2023-04-27 DIAGNOSIS — G8929 Other chronic pain: Secondary | ICD-10-CM

## 2023-04-27 MED ORDER — MORPHINE SULFATE ER 30 MG PO TBCR: 30.0000 mg | EXTENDED_RELEASE_TABLET | Freq: Three times a day (TID) | ORAL | 0 refills | Status: DC
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
## 2023-05-09 ENCOUNTER — Telehealth: Payer: Self-pay | Admitting: *Deleted

## 2023-05-09 NOTE — Telephone Encounter (Signed)
[facility] in [place]? If so, they just called and said Dr. Orvan Falconer can't offer patient anything.   Per ET I told Cindy Pratt the above on her last office visit, can you give her a call. She will have to ask her PCP to send in another referral place, I can assist her to being weaned of the Morphine if she likes   LVM to call back.

## 2023-05-10 NOTE — Telephone Encounter (Signed)
I spoke with Babli and gave her the message about the ortho refusal. She will ask her PCP for another referral and see Riley Lam on 9/17 for appt.

## 2023-05-20 ENCOUNTER — Encounter: Payer: Self-pay | Admitting: Registered Nurse

## 2023-05-20 ENCOUNTER — Encounter: Payer: 59 | Attending: Registered Nurse | Admitting: Registered Nurse

## 2023-05-20 VITALS — BP 114/67 | HR 59 | Ht 64.0 in | Wt 115.0 lb

## 2023-05-20 DIAGNOSIS — M5412 Radiculopathy, cervical region: Secondary | ICD-10-CM | POA: Insufficient documentation

## 2023-05-20 DIAGNOSIS — Z79891 Long term (current) use of opiate analgesic: Secondary | ICD-10-CM | POA: Diagnosis not present

## 2023-05-20 DIAGNOSIS — M546 Pain in thoracic spine: Secondary | ICD-10-CM | POA: Insufficient documentation

## 2023-05-20 DIAGNOSIS — G5793 Unspecified mononeuropathy of bilateral lower limbs: Secondary | ICD-10-CM | POA: Insufficient documentation

## 2023-05-20 DIAGNOSIS — M797 Fibromyalgia: Secondary | ICD-10-CM | POA: Diagnosis present

## 2023-05-20 DIAGNOSIS — Z5181 Encounter for therapeutic drug level monitoring: Secondary | ICD-10-CM | POA: Diagnosis not present

## 2023-05-20 DIAGNOSIS — M47816 Spondylosis without myelopathy or radiculopathy, lumbar region: Secondary | ICD-10-CM | POA: Insufficient documentation

## 2023-05-20 DIAGNOSIS — G894 Chronic pain syndrome: Secondary | ICD-10-CM | POA: Insufficient documentation

## 2023-05-20 DIAGNOSIS — M792 Neuralgia and neuritis, unspecified: Secondary | ICD-10-CM | POA: Insufficient documentation

## 2023-05-20 DIAGNOSIS — G8929 Other chronic pain: Secondary | ICD-10-CM | POA: Diagnosis present

## 2023-05-20 DIAGNOSIS — M542 Cervicalgia: Secondary | ICD-10-CM | POA: Diagnosis present

## 2023-05-20 MED ORDER — TRAMADOL HCL 50 MG PO TABS: 50.0000 mg | ORAL_TABLET | Freq: Four times a day (QID) | ORAL | 1 refills | Status: AC

## 2023-05-20 MED ORDER — MORPHINE SULFATE ER 30 MG PO TBCR: 30.0000 mg | EXTENDED_RELEASE_TABLET | Freq: Three times a day (TID) | ORAL | 0 refills | Status: DC
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
## 2023-05-22 ENCOUNTER — Other Ambulatory Visit: Payer: Self-pay | Admitting: Registered Nurse

## 2023-05-26 LAB — TOXASSURE SELECT,+ANTIDEPR,UR

## 2023-05-27 MED ORDER — TOPIRAMATE 50 MG PO TABS: 50.0000 mg | ORAL_TABLET | Freq: Two times a day (BID) | ORAL | 1 refills | Status: AC

## 2023-06-20 ENCOUNTER — Encounter: Payer: Self-pay | Admitting: Registered Nurse

## 2023-06-20 ENCOUNTER — Encounter: Payer: 59 | Admitting: Registered Nurse

## 2023-06-20 ENCOUNTER — Encounter: Payer: 59 | Attending: Registered Nurse | Admitting: Registered Nurse

## 2023-06-20 VITALS — Ht 64.0 in | Wt 120.0 lb

## 2023-06-20 DIAGNOSIS — Z5181 Encounter for therapeutic drug level monitoring: Secondary | ICD-10-CM

## 2023-06-20 DIAGNOSIS — G8929 Other chronic pain: Secondary | ICD-10-CM

## 2023-06-20 DIAGNOSIS — M792 Neuralgia and neuritis, unspecified: Secondary | ICD-10-CM

## 2023-06-20 DIAGNOSIS — M797 Fibromyalgia: Secondary | ICD-10-CM | POA: Diagnosis not present

## 2023-06-20 DIAGNOSIS — G894 Chronic pain syndrome: Secondary | ICD-10-CM

## 2023-06-20 DIAGNOSIS — M47816 Spondylosis without myelopathy or radiculopathy, lumbar region: Secondary | ICD-10-CM

## 2023-06-20 DIAGNOSIS — M542 Cervicalgia: Secondary | ICD-10-CM | POA: Diagnosis not present

## 2023-06-20 DIAGNOSIS — M5412 Radiculopathy, cervical region: Secondary | ICD-10-CM

## 2023-06-20 DIAGNOSIS — M546 Pain in thoracic spine: Secondary | ICD-10-CM | POA: Insufficient documentation

## 2023-06-20 DIAGNOSIS — Z79891 Long term (current) use of opiate analgesic: Secondary | ICD-10-CM

## 2023-06-20 MED ORDER — MORPHINE SULFATE ER 30 MG PO TBCR: 30.0000 mg | EXTENDED_RELEASE_TABLET | Freq: Three times a day (TID) | ORAL | 0 refills | Status: AC
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
## 2023-07-21 ENCOUNTER — Encounter: Payer: 59 | Admitting: Registered Nurse

## 2024-02-24 ENCOUNTER — Encounter (HOSPITAL_COMMUNITY): Payer: Self-pay | Admitting: Interventional Radiology
